# Patient Record
Sex: Male | Born: 1965 | Race: White | Hispanic: No | Marital: Single | Smoking: Current every day smoker
Health system: Southern US, Community
[De-identification: ages and names within clinical notes are randomized; demographics above are authoritative.]

## PROBLEM LIST (undated history)

## (undated) DIAGNOSIS — R569 Unspecified convulsions: Secondary | ICD-10-CM

## (undated) DIAGNOSIS — F329 Major depressive disorder, single episode, unspecified: Secondary | ICD-10-CM

## (undated) DIAGNOSIS — F32A Depression, unspecified: Secondary | ICD-10-CM

## (undated) DIAGNOSIS — E119 Type 2 diabetes mellitus without complications: Secondary | ICD-10-CM

## (undated) HISTORY — PX: LUMBAR FUSION: SHX111

## (undated) HISTORY — PX: INGUINAL HERNIA REPAIR: SHX194

---

## 2001-09-28 ENCOUNTER — Inpatient Hospital Stay (HOSPITAL_COMMUNITY): Admission: RE | Admit: 2001-09-28 | Discharge: 2001-09-29 | Payer: Self-pay | Admitting: Neurosurgery

## 2001-09-28 ENCOUNTER — Encounter: Payer: Self-pay | Admitting: Neurosurgery

## 2001-11-09 ENCOUNTER — Encounter: Admission: RE | Admit: 2001-11-09 | Discharge: 2001-11-09 | Payer: Self-pay | Admitting: Neurosurgery

## 2001-11-09 ENCOUNTER — Encounter: Payer: Self-pay | Admitting: Neurosurgery

## 2001-12-24 ENCOUNTER — Encounter: Admission: RE | Admit: 2001-12-24 | Discharge: 2001-12-24 | Payer: Self-pay | Admitting: Neurosurgery

## 2001-12-24 ENCOUNTER — Encounter: Payer: Self-pay | Admitting: Neurosurgery

## 2004-05-23 ENCOUNTER — Emergency Department: Payer: Self-pay | Admitting: Emergency Medicine

## 2004-06-09 ENCOUNTER — Emergency Department: Payer: Self-pay | Admitting: Emergency Medicine

## 2011-05-25 ENCOUNTER — Inpatient Hospital Stay: Payer: Self-pay | Admitting: Psychiatry

## 2012-02-02 LAB — COMPREHENSIVE METABOLIC PANEL
Albumin: 4.2 g/dL (ref 3.4–5.0)
Alkaline Phosphatase: 116 U/L (ref 50–136)
Anion Gap: 13 (ref 7–16)
BUN: 18 mg/dL (ref 7–18)
Bilirubin,Total: 0.9 mg/dL (ref 0.2–1.0)
Calcium, Total: 9.5 mg/dL (ref 8.5–10.1)
Chloride: 105 mmol/L (ref 98–107)
Co2: 22 mmol/L (ref 21–32)
Creatinine: 1.01 mg/dL (ref 0.60–1.30)
EGFR (African American): >60
EGFR (Non-African Amer.): >60
Glucose: 132 mg/dL — ABNORMAL HIGH (ref 65–99)
Osmolality: 283 (ref 275–301)
Potassium: 4 mmol/L (ref 3.5–5.1)
SGOT(AST): 46 U/L — ABNORMAL HIGH (ref 15–37)
SGPT (ALT): 47 U/L
Sodium: 140 mmol/L (ref 136–145)
Total Protein: 7.9 g/dL (ref 6.4–8.2)

## 2012-02-02 LAB — CBC
HCT: 50.4 % (ref 40.0–52.0)
HGB: 17 g/dL (ref 13.0–18.0)
MCH: 31.1 pg (ref 26.0–34.0)
MCHC: 33.6 g/dL (ref 32.0–36.0)
MCV: 92 fL (ref 80–100)
Platelet: 275 10*3/uL (ref 150–440)
RBC: 5.46 10*6/uL (ref 4.40–5.90)
RDW: 12.2 % (ref 11.5–14.5)
WBC: 6.9 10*3/uL (ref 3.8–10.6)

## 2012-02-02 LAB — URINALYSIS, COMPLETE
Blood: NEGATIVE
Glucose,UR: NEGATIVE mg/dL (ref 0–75)
Nitrite: NEGATIVE
Ph: 6 (ref 4.5–8.0)
RBC,UR: 120 /HPF (ref 0–5)
Specific Gravity: 1.029 (ref 1.003–1.030)
Squamous Epithelial: NONE SEEN
WBC UR: 19 /HPF (ref 0–5)

## 2012-02-02 LAB — DRUG SCREEN, URINE
Amphetamines, Ur Screen: NEGATIVE (ref ?–1000)
Cannabinoid 50 Ng, Ur ~~LOC~~: NEGATIVE (ref ?–50)
Methadone, Ur Screen: NEGATIVE (ref ?–300)
Opiate, Ur Screen: NEGATIVE (ref ?–300)

## 2012-02-02 LAB — ETHANOL
Ethanol %: <0.003 % (ref 0.000–0.080)
Ethanol: <3 mg/dL

## 2012-02-02 LAB — SALICYLATE LEVEL: Salicylates, Serum: 2.1 mg/dL

## 2012-02-02 LAB — TSH: Thyroid Stimulating Horm: 1.96 u[IU]/mL

## 2012-02-02 LAB — ACETAMINOPHEN LEVEL: Acetaminophen: <2 ug/mL

## 2012-02-03 ENCOUNTER — Inpatient Hospital Stay: Payer: Self-pay | Admitting: Psychiatry

## 2012-02-03 LAB — ACETAMINOPHEN LEVEL: Acetaminophen: <2 ug/mL

## 2012-02-03 LAB — SALICYLATE LEVEL: Salicylates, Serum: 1.7 mg/dL

## 2012-02-04 LAB — URINALYSIS, COMPLETE
Bacteria: NONE SEEN
Blood: NEGATIVE
Ph: 5 (ref 4.5–8.0)
Protein: NEGATIVE
Specific Gravity: 1.029 (ref 1.003–1.030)

## 2012-02-07 LAB — GLUCOSE, RANDOM: Glucose: 234 mg/dL — ABNORMAL HIGH (ref 65–99)

## 2012-02-09 LAB — LIPID PANEL

## 2013-06-03 ENCOUNTER — Inpatient Hospital Stay: Payer: Self-pay | Admitting: Psychiatry

## 2013-06-03 LAB — COMPREHENSIVE METABOLIC PANEL
Albumin: 4.1 g/dL (ref 3.4–5.0)
BUN: 14 mg/dL (ref 7–18)
Bilirubin,Total: 0.6 mg/dL (ref 0.2–1.0)
Calcium, Total: 9.6 mg/dL (ref 8.5–10.1)
Co2: 26 mmol/L (ref 21–32)
Creatinine: 1.1 mg/dL (ref 0.60–1.30)
EGFR (African American): >60
EGFR (Non-African Amer.): >60
Glucose: 177 mg/dL — ABNORMAL HIGH (ref 65–99)
Osmolality: 279 (ref 275–301)
Potassium: 4 mmol/L (ref 3.5–5.1)
SGPT (ALT): 76 U/L (ref 12–78)
Sodium: 137 mmol/L (ref 136–145)
Total Protein: 8 g/dL (ref 6.4–8.2)

## 2013-06-03 LAB — URINALYSIS, COMPLETE
Bilirubin,UR: NEGATIVE
Blood: NEGATIVE
Leukocyte Esterase: NEGATIVE
Ph: 7 (ref 4.5–8.0)
Protein: 30
RBC,UR: 1 /HPF (ref 0–5)
Squamous Epithelial: <1
WBC UR: 1 /HPF (ref 0–5)

## 2013-06-03 LAB — DRUG SCREEN, URINE
Amphetamines, Ur Screen: NEGATIVE (ref ?–1000)
Barbiturates, Ur Screen: NEGATIVE (ref ?–200)
Benzodiazepine, Ur Scrn: POSITIVE (ref ?–200)
MDMA (Ecstasy)Ur Screen: NEGATIVE (ref ?–500)
Methadone, Ur Screen: NEGATIVE (ref ?–300)
Opiate, Ur Screen: NEGATIVE (ref ?–300)
Phencyclidine (PCP) Ur S: NEGATIVE (ref ?–25)
Tricyclic, Ur Screen: NEGATIVE (ref ?–1000)

## 2013-06-03 LAB — CBC
HCT: 46.2 % (ref 40.0–52.0)
Platelet: 210 10*3/uL (ref 150–440)
RBC: 5.03 10*6/uL (ref 4.40–5.90)
RDW: 12.4 % (ref 11.5–14.5)
WBC: 6.2 10*3/uL (ref 3.8–10.6)

## 2013-06-03 LAB — ETHANOL
Ethanol %: <0.003 % (ref 0.000–0.080)
Ethanol: <3 mg/dL

## 2013-06-13 DIAGNOSIS — Z01810 Encounter for preprocedural cardiovascular examination: Secondary | ICD-10-CM

## 2013-06-13 LAB — CBC WITH DIFFERENTIAL/PLATELET
Eosinophil %: 2 %
HCT: 45 % (ref 40.0–52.0)
HGB: 16.1 g/dL (ref 13.0–18.0)
MCH: 32.4 pg (ref 26.0–34.0)
MCHC: 35.7 g/dL (ref 32.0–36.0)
MCV: 91 fL (ref 80–100)
Neutrophil #: 4.2 10*3/uL (ref 1.4–6.5)
WBC: 6.9 10*3/uL (ref 3.8–10.6)

## 2013-06-13 LAB — BASIC METABOLIC PANEL
Calcium, Total: 9.6 mg/dL (ref 8.5–10.1)
Creatinine: 1.21 mg/dL (ref 0.60–1.30)
EGFR (African American): >60
Glucose: 278 mg/dL — ABNORMAL HIGH (ref 65–99)
Osmolality: 280 (ref 275–301)

## 2013-06-14 LAB — URINALYSIS, COMPLETE
Leukocyte Esterase: NEGATIVE
Nitrite: NEGATIVE
Ph: 5 (ref 4.5–8.0)
Protein: 30
Squamous Epithelial: 1
WBC UR: 4 /HPF (ref 0–5)

## 2014-03-13 IMAGING — CR DG CHEST 2V
1 series · 2 of 2 positions shown · non-contrast
Comparison: none

REASON FOR EXAM: ect
COMMENTS:

PROCEDURE:     DXR - DXR CHEST PA (OR AP) AND LATERAL  - June 13, 2013  [DATE]
RESULT:     No acute cardiopulmonary disease. Heart size normal.

[Series 2: w chest pa · 0.14mm/px · 2 of 2 slices shown]
[im 1/2]
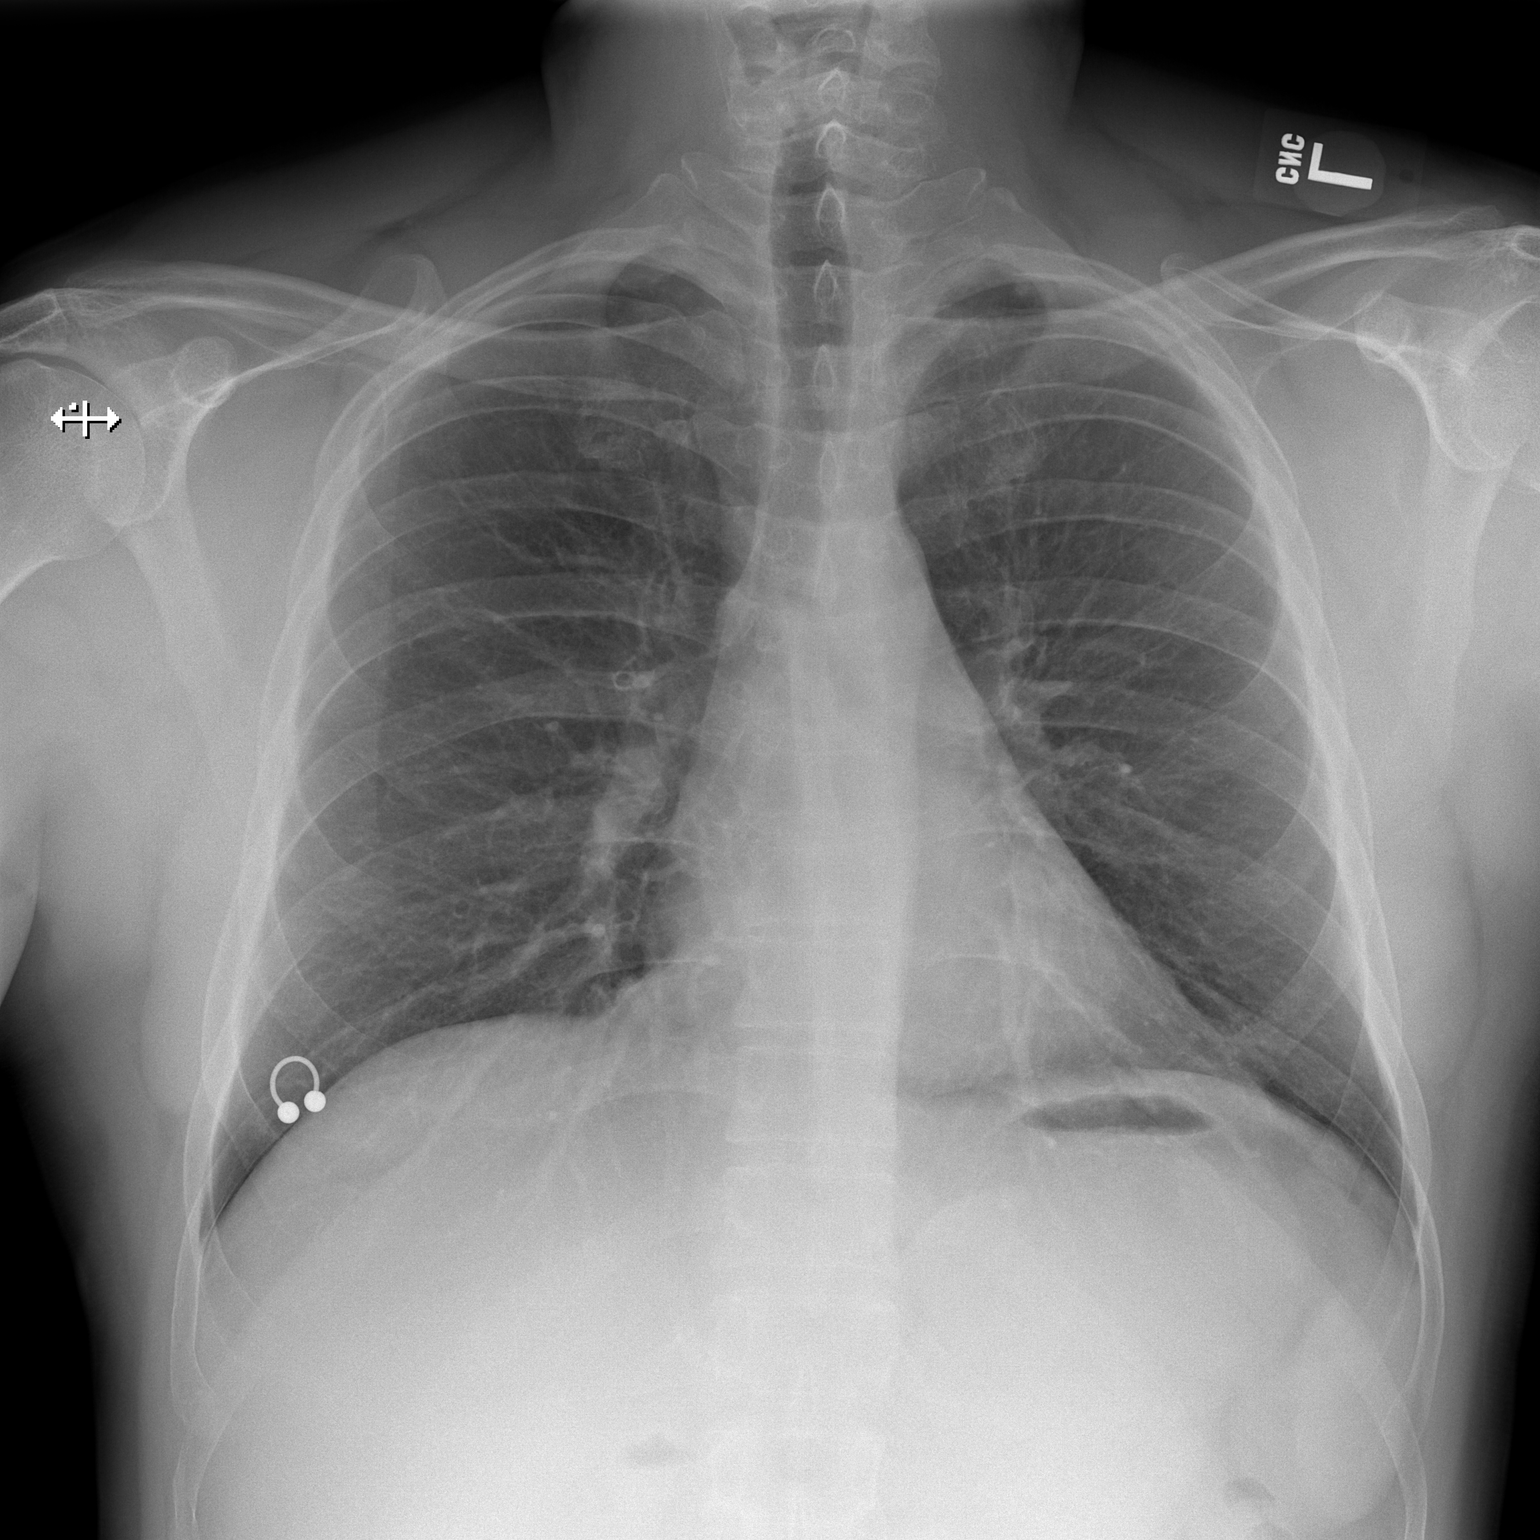
[im 2/2]
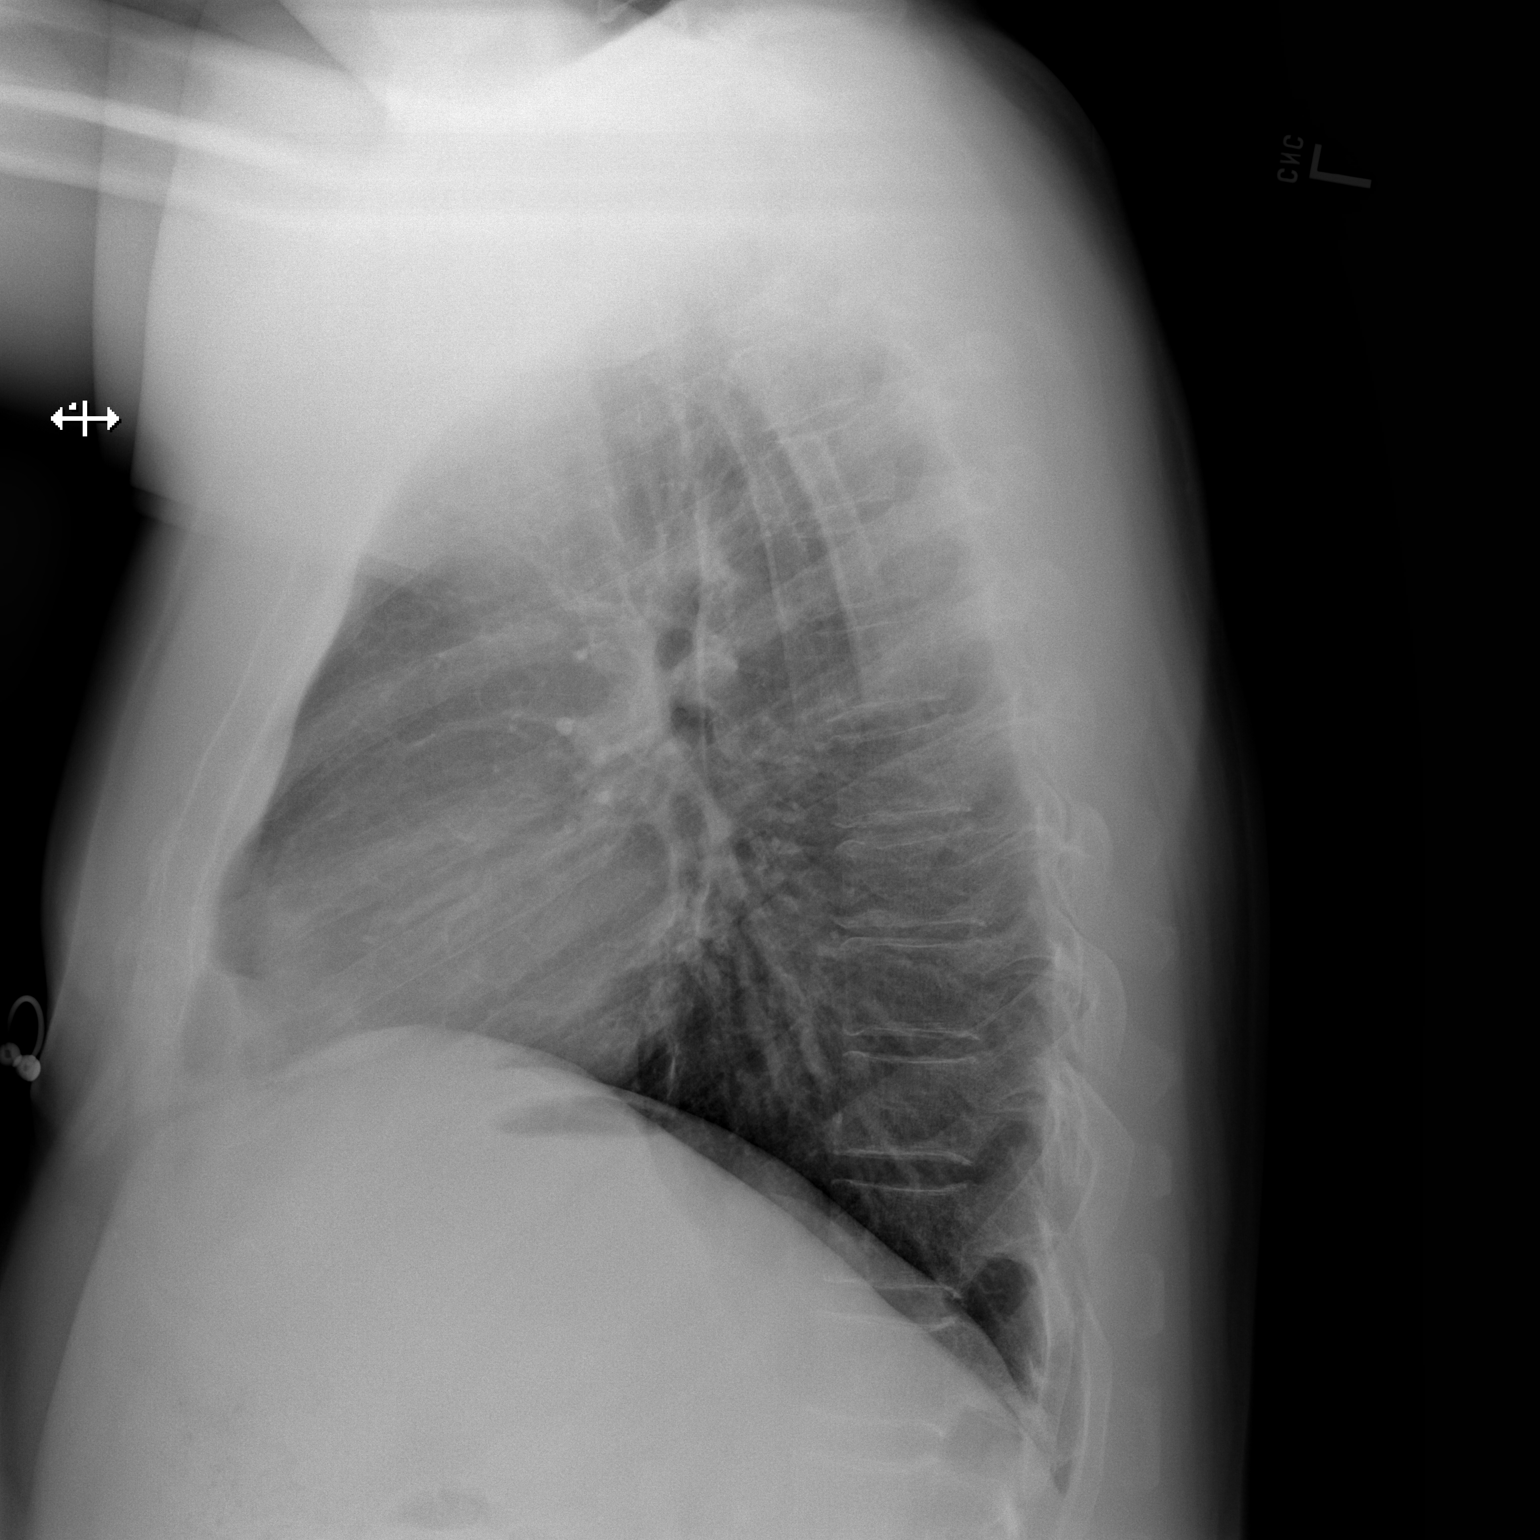

[2 of 2 positions shown; findings below may reference images not displayed]

IMPRESSION: No acute abnormality.

## 2014-12-12 NOTE — H&P (Signed)
PATIENT NAME:  Edgar White, Edgar White MR#:  [id number] DATE OF BIRTH:  03-05-1966  DATE OF ADMISSION:  06/03/2013  REFERRING PHYSICIAN: Emergency Room M.D.   ATTENDING PHYSICIAN: Orson Slick, M.D.   IDENTIFYING DATA: Edgar White is a 49 year old male with history of severe depression.   CHIEF COMPLAINT: " I just saw Edgar White."   HISTORY OF PRESENT ILLNESS: Edgar White went to see Edgar White, his regular psychiatrist and during the meeting, his disclosed to his provider that he has been increasingly depressed and anxious and has thoughts of suicide. He was sent to the hospital. The patient has a 3 year long history of depression that has been treatment resistant. The patient has not been able to function really and his elderly parents take care of him. He just learned that he 46 year old mother has ovarian cancer. She was then given 1 month to live and is getting chemotherapy now. The patient believes that she is the only friend in world as she suffers depression herself and understands him. He is overwhelmed with worry that he is going to lose her soon. He reports sleeping too much, poor appetite, anhedonia, feeling of guilt, hopelessness, worthlessness, poor energy and concentration, extreme anxiety with pacing all night long, sleeping all day long, hand wringing and intrusive thoughts of suicide. He does have a history of a suicide attempt, but denies having a plan at the moment. The patient was hospitalized at [facility] in the past. At that time, ECT treatment was reportedly recommended. The patient did not get treatment then. He came to the hospital encouraged by Edgar White that maybe this time around, ECT treatment is possible. The patient has no health insurance. He denies psychotic symptoms. He denies symptoms suggestive of bipolar mania. He denies alcohol or substance use.   PAST PSYCHIATRIC HISTORY: He has been hospitalized twice at [facility] in 2012  and 2013. He has been tried on multiple medications. He tried Effexor that at some point stopped working, Abilify and Celexa. He now has to take only cheap medications as he was refused ALAMAP assistance due to his parents income and assets. There is a history of prescription pill abuse including narcotics and benzodiazepines, but no history of alcohol or illicit substance use. There was 1 suicide attempt by overdose that he admits to.   FAMILY PSYCHIATRIC HISTORY: Mother with depression.   PAST MEDICAL HISTORY: Diabetes and GERD.   ALLERGIES: No known drug allergies.   MEDICATIONS ON ADMISSION: Zantac 150 mg twice daily, metformin 500 mg twice daily, Celexa 40 mg daily, Xanax 1 mg 3 times daily and Abilify 10 mg at bedtime.   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 He lives with his parents. He has never been married and has no children. He is a homosexual, but not in a relationship at the moment. He spends time at home except for visits to the gym with his brother 4 times a week. He complains that he does not have the energy to exercise.   REVIEW OF SYSTEMS: CONSTITUTIONAL: No fevers or chills. Positive for weight gain.  EYES: No double or blurred vision. ENT: No hearing loss. RESPIRATORY: No shortness of breath or cough. CARDIOVASCULAR: No chest pain or orthopnea. GASTROINTESTINAL: No abdominal pain, nausea, vomiting, or diarrhea. GENITOURINARY: No incontinence or frequency. ENDOCRINE: No heat or cold intolerance.  LYMPHATIC: No anemia or easy bruising. INTEGUMENTARY: No acne or rash. MUSCULOSKELETAL: No muscle or joint pain. NEUROLOGIC: No tingling or weakness. PSYCHIATRIC: See history of present illness for details.   PHYSICAL EXAMINATION:  VITAL SIGNS: Blood pressure 149/81, pulse 77, respirations 20, temperature 97.6.  GENERAL: This is an obese male in no acute distress.  HEENT: The pupils are  equal, round, and reactive to light. Sclerae are anicteric.  NECK: Supple. No thyromegaly.  LUNGS: Clear to auscultation. No dullness to percussion.  HEART: Regular rhythm and rate. No murmurs, rubs, or gallops.  ABDOMEN: Soft, nontender, nondistended. Positive bowel sounds.  MUSCULOSKELETAL: Normal muscle strength in all extremities.  SKIN: No rashes or bruises.  LYMPHATIC: No cervical adenopathy.  NEUROLOGIC: Cranial nerves II through XII are intact.   LABORATORY DATA: Chemistries are within normal limits. Blood sugar 177. Blood alcohol level is 0. LFTs within normal limits except for AST of 51. TSH 1.08. Urine tox screen is positive for benzodiazepines. CBC within normal limits used. Urinalysis is not suggestive of urinary tract infection.   MENTAL STATUS EXAMINATION ON ADMISSION: The patient is alert and oriented to person, place, time and situation. He is pleasant, polite and cooperative. He is in bed. There is severe psychomotor retardation. He cries throughout the interview. His eye contact is poor. His grooming is adequate. His speech is slow and soft. Mood is depressed with anxious affect. Even though there is severe psychomotor retardation he, at times, wrings his hands and moves his legs. I am wondering if he is withdrawing from benzodiazepines. His mood is depressed. Affect tearful. Thought process is logical. Thought content: He admits to passing suicidal ideations that are intrusive in the past couple of weeks. There are no delusions or paranoia. There are no auditory or visual hallucinations. His cognition is grossly intact. He registers 3 out of 3 and recalls 3 out of 3 objects after 5 minutes. He can spell "world" forward and backwards. He knows the current Software engineer. His insight and judgment are limited.  SUICIDE RISK ASSESSMENT ON ADMISSION: This is a patient with history of treatment resistant depression and suicide attempt, who is now under considerable stress due to illness of his  primary support person. He is at increased risk of suicide.   DIAGNOSES:  AXIS I: Major depressive disorder, recurrent, severe. Benzodiazepine dependence.  AXIS II: Personality disorder, not otherwise specified.  AXIS III: Diabetes and gastroesophageal reflux disease.  AXIS IV: Mental illness, major loss.  AXIS V: Global assessment of functioning on admission 25.   PLAN: The patient was admitted to [facility] behavioral medicine unit for safety, stabilization and medication management. He was initially placed on suicide precautions and was closely monitored for any unsafe behaviors. He underwent full psychiatric and risk assessment. He received pharmacotherapy, individual and group psychotherapy, substance abuse counseling, and support from therapeutic milieu.  1.  Suicidal ideation. The patient is able to contract for safety.  2.  Mood. We will continue his current regimen of Celexa. The patient has no resources and wants to stick with a cheap medication, however. We will ask Dr. Weber Cooks for a consultation regarding the possibility of ECT treatment.  3.  Anxiety: The patient appears quite anxious. I wonder if he is using his Xanax prescribed by Edgar White appropriately. He is taking 3 pills a day. He claims that he did not get any this morning while in the hospital. We will restart that and give it at 8:00, noon and 5:00 in  the afternoon and use a sleeping aid if necessary to induce sleep.   4.  Medical. We will continue his medications for GERD and diabetes with blood glucose monitoring.   DISPOSITION: He will return home with his parents.   ____________________________ Wardell Honour. Bary Leriche, MD jbp:aw D: 06/04/2013 11:35:22 ET T: 06/04/2013 12:04:13 ET JOB#: [id number]  cc: Emad Brechtel B. Bary Leriche, MD, <Dictator> Clovis Fredrickson MD ELECTRONICALLY SIGNED 06/04/2013 21:02

## 2014-12-12 NOTE — Consult Note (Signed)
Brief Consult Note: Diagnosis: Major depressive disorder.   Patient was seen by consultant.   Consult note dictated.   Recommend further assessment or treatment.   Orders entered.   Comments: Will admit to psychiatry.  Electronic Signatures: Orson Slick (MD)  (Signed 13-Oct-14 14:51)  Authored: Brief Consult Note   Last Updated: 13-Oct-14 14:51 by Orson Slick (MD)

## 2014-12-12 NOTE — Discharge Summary (Signed)
PATIENT NAME:  Edgar White, Edgar White MR#:  [id number] DATE OF BIRTH:  November 18, 1965  DATE OF ADMISSION:  06/03/2013  DATE OF DISCHARGE:  07/02/2013  HOSPITAL COURSE: See dictated history and physical for details of admission. A 48 year old man with a history of recurrent major depression, was admitted to the hospital once again depressed, voicing suicidal ideation. In the hospital, he continued to have symptoms of severe depression, low mood, negative thinking, suicidal ideation. Medication did not seem to be producing a lasting effect. The patient was requesting ECT. He was ambivalent at first because his lack of insurance means he would not be able to continue to get ECT for maintenance, but eventually we decided to give it a try to see if we could get him better in the hospital. He was transferred to my service and started ECT on October 24. At the time of discharge, he has had 8  right unilateral ECT treatments, which he has tolerated well, without any major difficulty. He had some short-term memory loss, as would be expected, and he also has complained of a feeling like he has a band around his head, although it is not painful. For the first half of his treatment course, he did not show significant improvement, but over the last several days he had gotten much better. His mood is now stated as being good. His affect is brighter. He denies suicidal ideation. He is less anxious, and he is much more calm. He remains lucid. He is optimistic about the future. He understands that he will not be able to get maintenance ECT treatment unless he gets insurance, but will follow up with Dr. Randel Books outside the hospital. We did make some medication adjustments, having discontinued his antipsychotics, and also cut back a little bit on some of his antidepressants in an attempt to resolve some side effects of agitation he was having. I have educated the patient that the antidepressant doses are certainly not set in stone, and that he  can follow up with his outpatient psychiatrist about appropriate followup treatment. His blood sugars have been under reasonably good control in the hospital. Otherwise, he appears to be medically stable. He will be discharged today. Dangerousness appears to be resolved for the time being.   LABORATORY RESULTS: Admission labs included a urinalysis positive for 50 glucose, which is modest. Drug screen was positive for benzodiazepines. CBC normal. Chemistry panel showed a glucose elevated at 177. AST elevated at 51. Alcohol undetected. TSH normal. Ventricular rate was 88, normal EKG. Blood sugars have been in the mostly mid 100 to low 200 range.   DISCHARGE MEDICATIONS: Bupropion extended-release 300 mg once a day, citalopram 20 mg once a day, metformin 500 mg twice a day, hydroxyzine 50 mg every 6 hours as needed for anxiety, ranitidine 150 mg twice a day.   MENTAL STATUS EXAMINATION AT DISCHARGE: A neatly groomed, casually dressed gentleman who looks his stated age, appropriate in the interview. Good eye contact, normal psychomotor activity. Speech normal rate, tone and volume. Affect euthymic, smiling, reactive, appropriate. Mood stated as being okay. Thoughts are generally lucid. No sign of loosening of associations or delusions. Denies auditory or visual hallucinations. Reports suicidal ideation is gone. Good judgment and insight.   DISPOSITION: Discharge home with his family. Follow up with [facility].   DIAGNOSIS, PRINCIPAL AND PRIMARY:  AXIS I: Major depression, severe, recurrent.   SECONDARY DIAGNOSES: AXIS I:  No further.  AXIS II:  Deferred.  AXIS III:  Diabetes, gastric reflux symptoms.  AXIS IV:  Severe, from joblessness, depression, no money, limited resources.  AXIS V:  Functioning at time of discharge 7.     ____________________________ Gonzella Lex, MD jtc:mr D: 07/02/2013 16:06:00 ET T: 07/02/2013 19:43:00 ET JOB#: [id number]  cc: Gonzella Lex, MD, <Dictator> Gonzella Lex  MD ELECTRONICALLY SIGNED 07/03/2013 17:47

## 2014-12-14 NOTE — Discharge Summary (Signed)
PATIENT NAME:  Edgar White, Edgar White MR#:  [id number] DATE OF BIRTH:  Apr 26, 1966  DATE OF ADMISSION:  02/03/2012 DATE OF DISCHARGE:  02/11/2012  HOSPITAL COURSE: See dictated history and physical for details of admission. This 49 year old man came to the Emergency Room after taking an intentional overdose of Effexor with suicidal intent. He reported a long history of major depression with recent exacerbation. Previously it had responded well to Effexor but recently he did not seem to be getting any improvement despite compliance with his medicine. Patient stated that he was hopeless, felt out of control. He admitted that he had taken some benzodiazepines from his mother but denied that he was abusing other drugs. In the hospital we discussed treatment options. The idea of electroconvulsive therapy was discussed. It was one option but because he is a crisis patient he would have still been billed for the anesthesia and would have incurred a significant expense for it. We discussed other options and I started him on a tricyclic antidepressant. He had never been on one before. He has been taking nortriptyline 50 mg at night and has been tolerating it well. He reports mild dry mouth but no other significant side effects. He has shown significant improvement in his mood. For several days now he has said that his mood feels calmer and that his thoughts feel clearer. He does not feel depressed. Feels more upbeat and hopeful. Denies any suicidal ideation. He has interacted well with other patient's and gone to groups appropriately. He is taking care of himself and shows improved insight. As far as his mental status goes he has shown significant improvement in depression. He has been given individual and group supportive and educational counseling. At this time he feels stable and completely denied suicidal ideation. He has been educated about the risks of his medication including the potential fatality of an overdose of  nortriptyline and warned not to over dose on it. He has been agreeable to this plan. At this time he will be discharged and will follow up with Dr. Jacqualine Code in the community. Medically he had some changes in the hospital. It was discovered that his blood sugar was running high. He had not previously been diagnosed with diabetes but an endocrine consult was done and it turns out that he does have diabetes. He has been started on metformin. He is educated about this and needs to follow up with an outpatient primary care doctor for treatment of his diabetes. We also checked his lipid panel and he has elevated cholesterol and low HDL. The triglycerides are probably not meaningful given that it was not a fasting blood draw. He needs to have all of that followed up and checked in the future as well and has been educated about that.   MENTAL STATUS EXAM AT DISCHARGE: Neatly dressed and groomed man. Good eye contact. Normal psychomotor activity. Speech normal rate, tone, and volume. Alert and oriented x4. Affect reactive and euthymic. Mood stated as being good. Thoughts are lucid with no evidence of loosening of associations or delusions. Denies auditory or visual hallucinations. Denies suicidal or homicidal ideation. Shows improved judgment and insight.   DISCHARGE MEDICATIONS:  1. TriCor 48 mg per day.  2. Nortriptyline 50 mg at night. 3. Zantac 150 mg twice a day. 4. Trazodone 50 mg at night. 5. Metformin 500 mg twice a day.   LABORATORY, DIAGNOSTIC AND RADIOLOGICAL DATA: Admission labs show benzodiazepines positive in the drug screen. TSH normal. Alcohol undetectable. Chemistry showed slightly  elevated AST at 46, glucose 132 on admission. CBC was normal. Urinalysis showed positive protein and glucose as well as trace leukocyte esterase, positive red blood cells and white blood cells. Acetaminophen and salicylates normal. Follow up urinalysis showed no red blood cells, still with significant glucose. The  hemoglobin A1c was 6.4, which is slightly elevated. The lipid panel showed a total cholesterol of 242, triglycerides 793, HDL 18.   DISPOSITION: He is discharged back home with his mother and will follow up with Dr. Jacqualine Code, appointment is made. He is encouraged to get a primary care doctor and follow up for his diabetes and elevated lipids.   DIAGNOSIS PRINCIPLE AND PRIMARY:  AXIS I: Major depressive episode, severe, recurrent.   SECONDARY DIAGNOSES:  AXIS I: Benzodiazepine abuse.   AXIS II: Deferred.   AXIS III:  1. Diabetes. 2. Hypercholesterolemia. 3. Status post overdose of Effexor with no side effects.   AXIS IV: Moderate to severe from lack of resources, lack of job, minimal social support.    AXIS V: Functioning at time of discharge 60.   ____________________________ Gonzella Lex, MD jtc:cms D: 02/11/2012 12:17:28 ET T: 02/11/2012 13:14:20 ET JOB#: [id number]  cc: Gonzella Lex, MD, <Dictator> Gonzella Lex MD ELECTRONICALLY SIGNED 02/11/2012 23:09

## 2014-12-14 NOTE — Consult Note (Signed)
PATIENT NAME:  Edgar White, Edgar White MR#:  [id number] DATE OF BIRTH:  September 15, 1965  DATE OF CONSULTATION:  02/07/2012  REFERRING PHYSICIAN:  Alethia Berthold, MD CONSULTING PHYSICIAN:  A. Lavone Orn, MD  CHIEF COMPLAINT: Hyperglycemia.   HISTORY OF PRESENT ILLNESS: This is a 49 year old male seen in consultation for hyperglycemia. He is admitted to the Behavior Medicine Unit for depression. He was admitted on 02/02/2012. He has no known history of diabetes. On 02/04/2012 he had an elevated glucose level in the urine of 150 mg/dL. Since that time he has had several fingerstick blood sugars which have been consistently elevated. His fasting fingerstick today was 223 and fasting venous blood sugar was 234. Patient confirms it was fasting, states he had not had anything to eat since the night prior. He has not had any recent use of glucocorticoids. He denies any recent illness. A hemoglobin A1c was found to be 6.4% today. He denies any polyuria or polydipsia. He denies any blurred vision. He denies any numbness, tingling or pain in the feet. He has had about an 8 pound weight loss in the last two months which he attributes to decreased oral intake due to poor appetite from depression. He does complain of long-standing fatigue. He denies any recent nausea, vomiting, or abdominal pain. No known history of pancreatitis.   PAST MEDICAL HISTORY: Depression.   SOCIAL HISTORY: Single. Not employed. Smokes tobacco.   FAMILY HISTORY: His mother, father and brother all have diabetes.   ALLERGIES: No known drug allergies.   INPATIENT MEDICATIONS:  1. Ativan 1 mg each morning as needed.  2. Nortriptyline 50 mg at bedtime.  3. Trazodone 50 mg at bedtime.  4. Effexor-XR 75 mg daily.   REVIEW OF SYSTEMS: As per history of present illness. In addition: HEENT: No headache. No sore throat. NECK: No neck pain. No dysphasia. CARDIAC: No chest pain. No palpitations. PULMONARY: No cough, no shortness of breath. ABDOMEN: No  abdominal pain. No recent change in bowel habits. SKIN: No recent skin changes or pruritus. ENDO: No heat or cold intolerance. LYMPH: No easy bruisability or recent bleeding.   PHYSICAL EXAMINATION:  VITAL SIGNS: Temperature 98.8, pulse 112, respirations 20, blood pressure 137/85.   GENERAL: Well-developed, well-nourished white male in no acute distress.   HEENT: Extraocular movements are intact. No proptosis, lid lag or stare. Oropharynx is clear. Mucous membranes moist.   NECK: Supple. No thyromegaly. No carotid bruit.   CARDIAC: Regular rate and rhythm. No murmur.   PULMONARY: Clear to auscultation bilaterally. No wheeze or rhonchi. Good inspiratory effort.   ABDOMEN: Diffusely soft, nontender, nondistended. No hepatosplenomegaly.   SKIN: No rash or dermatopathy.   MUSCULOSKELETAL: No leg edema. Good motor tone throughout. Good strength.   LYMPH: No submandibular or anterior cervical lymphadenopathy.   PSYCHIATRIC: Alert and oriented x3, calm and cooperative.   LABORATORY, DIAGNOSTIC AND RADIOLOGICAL DATA: 02/02/2012 glucose 132, BUN 18, creatinine 1.01, sodium 140, potassium 4.0, chloride 105, eGFR greater than 60, calcium 9.5, AST 46, ALT 47. TSH 1.96. 02/07/2012 hemoglobin A1c 6.4%.   ASSESSMENT: This is a 49 year old male with high fasting blood sugars concerning for diabetes and A1c in the prediabetes range, he probably does have early onset type 2 diabetes.   RECOMMENDATIONS:  1. Discussed with patient this diagnosis. He is familiar with diabetes. As an outpatient, he should be set up for diabetes education. I do not recommend inpatient diabetes teaching at this time as I suspect it would not be too useful in  the setting of his severe depression.  2. Recommend initiation of metformin 500 mg daily. This is to be given at meals. Counseled patient about potential GI side effects.  3. Counseled patient that he would benefit from weight loss and regular exercise.  4. I will  follow along with you and increase his metformin, as tolerated and as needed.   Thank you for the kind request for consultation.   ____________________________ A. Lavone Orn, MD ams:cms D: 02/07/2012 16:37:56 ET T: 02/07/2012 17:14:23 ET JOB#: [id number]  cc: A. Lavone Orn, MD, <Dictator>  Sherlon Handing MD ELECTRONICALLY SIGNED 02/21/2012 8:40

## 2014-12-14 NOTE — Consult Note (Signed)
Chief Complaint and History:   Referring Physician Dr. Weber Cooks    Chief Complaint hyperglycemia   Allergies:  No Known Allergies:   Assessment/Plan:   Assessment/Plan Patient was seen, examined, and chart was reviewed. He is a 49 yo M admitted with depression. He has no known history of diabetes. He has a strong family history of diabetes. He had glucosuria and several high sugars on fingerstick testing. His Hgb A1c is 6.4%.  A/ Probable diabetes  P/  1. Educated patient about diagnosis. In future, it would be helpful to arrange out-patient diabetes education. 2. Start metformin ER 500 mg at supper. 3. Continue monitoring of FSBS qAM fasting.  I will follow with you and consider titrating up metformin, as needed.   Electronic Signatures: Judi Cong (MD)  (Signed 18-Jun-13 16:30)  Authored: Chief Complaint and History, ALLERGIES, Assessment/Plan   Last Updated: 18-Jun-13 16:30 by Judi Cong (MD)

## 2014-12-14 NOTE — H&P (Signed)
PATIENT NAME:  Edgar White, Edgar White MR#:  [id number] DATE OF BIRTH:  05-18-1966  DATE OF ADMISSION:  02/02/2012  IDENTIFYING INFORMATION AND CHIEF COMPLAINT: This is a 49 year old man with a history of major depression who comes into the Emergency Room after taking an overdose of prescription Effexor.   CHIEF COMPLAINT: "I just can't take it anymore."   HISTORY OF PRESENT ILLNESS: The patient states that his depression is severe. He feels sad and depressed all the time. His energy is very poor. He stays in bed for up to 20 hours a day. He has almost no social interaction. His appetite has been poor. He has almost constant ruminative thoughts about suicide and death. He says that recently he has been having experiences that he describes as feeling like he is "out of his body" or "not there". When he describes it to me it sounds like probably dissociative symptoms associated with cognitive slowing of depression, but not actually psychotic symptoms. He has been taking Effexor 300 mg a day from Dr. Jacqualine Code. He says that in the last six months he has used cocaine twice and has taken the occasional Percocet but is not using substances on a daily basis and is not drinking. He has been feeling increasingly hopeless, like there is nothing he can do to get better. Effexor used to be helpful for him, but in the last couple of years it is no longer treating his depression. He has been on multiple other medications without benefit.   PAST PSYCHIATRIC HISTORY: The patient says that he has never actually taken an overdose or tried to kill himself in the past before, although he has had chronic suicidal ideation. He has had previous hospitalizations, most recently here in October 2012. At that time he was diagnosed with major depression with psychotic features and was treated with Abilify and Effexor. He says he was never all that impressed that the Abilify made a big difference, but that he could not afford it anyway because he  was denied. [employer]  funds.   SUBSTANCE ABUSE HISTORY: He has a history of abusing substances, especially prescription narcotics and benzodiazepines. Currently he does not seem to be actively having dependence behavior.   SOCIAL HISTORY: The patient is unemployed. He has been out of work since 2008. He is a Therapist, nutritional by training. He lives with his parents. The patient is gay but is currently not in any kind of relationship. He seems to feel quite isolated. He does say that his brother makes him get up and go to the gym 3 times a week and that he does that, but that it has not been making him feel any better.   PAST MEDICAL HISTORY: Does not have any significant ongoing medical problems.   CURRENT MEDICATIONS:  1. Effexor 300 mg per day.  2. Ativan 1 mg per a.m.   ALLERGIES: No known drug allergies.   REVIEW OF SYSTEMS: The patient complains of depressed mood, constant sadness, fatigue, excessive sleepiness, decreased appetite, ruminative suicidal thoughts, feeling cognitively slowed. Does not have any specific physical complaints otherwise. Denies pain. Denies nausea or vomiting or shortness of breath.   MENTAL STATUS EXAM: Neatly groomed man in hospital garb interviewed in the Emergency Room. He was alert, awake, and completely cooperative. Made good eye contact throughout the interview. Affect was constricted. Mood stated as depressed. Speech was normal in rate and tone. Psychomotor activity normal. Thoughts appeared lucid and directed with no evidence of loosening of  associations. Denied auditory or visual hallucinations. Endorses suicidal ideation and hopelessness. Denies homicidal ideation. Cognitively intact. Short-term and longer-term memory grossly intact. Appears to be of above average intelligence. Judgment recently impaired with suicide attempt. Insight adequate.   PHYSICAL EXAMINATION:  GENERAL: The patient is a healthy-appearing man who looks his stated  age.   SKIN: No skin lesions identified.   VITAL SIGNS: His most recent vital signs show a temperature of 97.7, pulse 93, respirations 20, blood pressure 112/70.   HEENT: Pupils are equal and reactive. Face is symmetric.   NECK: Nontender.   BACK: Nontender.   MUSCULOSKELETAL: Full range of motion at all extremities. Normal gait.   NEURO: Cranial nerves all intact and symmetric. Strength is symmetric as well as reflexes upper and lower bilaterally.   LUNGS: Clear to auscultation with no wheezes.   HEART: Regular rate and rhythm, normal sounds.   ABDOMEN: Soft, nontender, normal bowel sounds.   ASSESSMENT: This is a 49 year old man with severe, recurrent major depression which is currently resistant to treatment with medication. In the past Effexor was the only thing that was consistently helpful, but he has currently been taking 300 mg a day for several months with no benefit. He has a past history of failure to respond to multiple other antidepressants as well as lithium. Currently made a suicide attempt with serious intent and has ongoing suicidal ideation requiring hospitalization.   TREATMENT PLAN: Admit to the hospital. Review labs. Check vital signs regularly. Engage him in groups and activities and therapy on the unit. I have discussed appropriate treatment with him. I have raised the possibility of electroconvulsive therapy next week and he is willing to consider it. I have also suggested to him that given his history he has not been on a tricyclic antidepressant and that it would make sense to try a tricyclic antidepressant. I warned him about the potential risk of overdose including fatality with it. He was agreeable to that, particularly when I told him that it might help to improve his appetite. I will try weaning him gradually on the Effexor while starting nortriptyline 50 mg at night. Ativan p.r.n. and in the morning.   DIAGNOSIS PRINCIPLE AND PRIMARY:  AXIS I: Major  depressive episode, severe, recurrent.   SECONDARY DIAGNOSES:  AXIS I: No further.   AXIS II: Deferred.   AXIS III: No diagnosis.   AXIS IV: Severe from being out of work, very limited access to treatment. No money.   AXIS V: Functioning at time of evaluation: 32.    ____________________________ Gonzella Lex, MD [id number] D: 02/03/2012 14:28:00 ET T: 02/03/2012 14:46:07 ET JOB#: [id number]  cc: Gonzella Lex, MD, <Dictator> Gonzella Lex MD ELECTRONICALLY SIGNED 02/03/2012 16:06

## 2014-12-14 NOTE — Consult Note (Signed)
Brief Consult Note: Diagnosis: mde.   Patient was seen by consultant.   Recommend further assessment or treatment.   Orders entered.   Comments: Psychiatry: Patient seen. Severe recurrent major depression with overdose. Non-dangerous od but done with lethal intent. Admit to [facility]. See full H&P.  Electronic Signatures: Gonzella Lex (MD)  (Signed 14-Jun-13 14:34)  Authored: Brief Consult Note   Last Updated: 14-Jun-13 14:34 by Gonzella Lex (MD)

## 2015-07-12 ENCOUNTER — Emergency Department (HOSPITAL_COMMUNITY)
Admission: EM | Admit: 2015-07-12 | Discharge: 2015-07-12 | Disposition: A | Discharge status: Home / Self Care | Payer: 59 | Attending: Emergency Medicine | Admitting: Emergency Medicine

## 2015-07-12 ENCOUNTER — Encounter (HOSPITAL_COMMUNITY): Payer: Self-pay | Admitting: *Deleted

## 2015-07-12 DIAGNOSIS — Y9289 Other specified places as the place of occurrence of the external cause: Secondary | ICD-10-CM | POA: Diagnosis not present

## 2015-07-12 DIAGNOSIS — E119 Type 2 diabetes mellitus without complications: Secondary | ICD-10-CM | POA: Insufficient documentation

## 2015-07-12 DIAGNOSIS — Y998 Other external cause status: Secondary | ICD-10-CM | POA: Diagnosis not present

## 2015-07-12 DIAGNOSIS — F329 Major depressive disorder, single episode, unspecified: Secondary | ICD-10-CM | POA: Diagnosis not present

## 2015-07-12 DIAGNOSIS — S4991XA Unspecified injury of right shoulder and upper arm, initial encounter: Secondary | ICD-10-CM | POA: Diagnosis present

## 2015-07-12 DIAGNOSIS — F17210 Nicotine dependence, cigarettes, uncomplicated: Secondary | ICD-10-CM | POA: Insufficient documentation

## 2015-07-12 DIAGNOSIS — S46811A Strain of other muscles, fascia and tendons at shoulder and upper arm level, right arm, initial encounter: Secondary | ICD-10-CM | POA: Insufficient documentation

## 2015-07-12 DIAGNOSIS — Y9389 Activity, other specified: Secondary | ICD-10-CM | POA: Diagnosis not present

## 2015-07-12 DIAGNOSIS — Z79899 Other long term (current) drug therapy: Secondary | ICD-10-CM | POA: Diagnosis not present

## 2015-07-12 DIAGNOSIS — X58XXXA Exposure to other specified factors, initial encounter: Secondary | ICD-10-CM | POA: Diagnosis not present

## 2015-07-12 HISTORY — DX: Major depressive disorder, single episode, unspecified: F32.9

## 2015-07-12 HISTORY — DX: Type 2 diabetes mellitus without complications: E11.9

## 2015-07-12 HISTORY — DX: Depression, unspecified: F32.A

## 2015-07-12 MED ORDER — HYDROCODONE-ACETAMINOPHEN 5-325 MG PO TABS: 1.0000 | ORAL_TABLET | ORAL | Status: DC | PRN

## 2015-07-12 MED ORDER — PREDNISONE 50 MG PO TABS: ORAL_TABLET | ORAL | Status: DC

## 2015-07-12 NOTE — ED Provider Notes (Signed)
CSN: [id number]     Arrival date & time 07/12/15  0807 History   First MD Initiated Contact with Patient 07/12/15 0815     Chief Complaint  Patient presents with  . Shoulder Pain     (Consider location/radiation/quality/duration/timing/severity/associated sxs/prior Treatment) HPI..... Status post pain in right upper back area with radiation to shoulder and hand for approximately one week after quickly rolling over in bed and turning his head to the right side. Right hand feels numb and tingly. His has been to his primary care doctor earlier in the week and given muscle relaxers and anti-inflammatory medicine. Severity of pain is moderate.  Past Medical History  Diagnosis Date  . Diabetes mellitus without complication ([place])   . Depression    Past Surgical History  Procedure Laterality Date  . Lumbar fusion    . Inguinal hernia repair     No family history on file. Social History  Substance Use Topics  . Smoking status: Current Every Day Smoker    Types: Cigarettes  . Smokeless tobacco: Never Used  . Alcohol Use: Yes     Comment: once a week    Review of Systems  All other systems reviewed and are negative.     Allergies  Review of patient's allergies indicates no known allergies.  Home Medications   Prior to Admission medications   Medication Sig Start Date End Date Taking? Authorizing Provider  citalopram (CELEXA) 40 MG tablet Take 40 mg by mouth daily.   Yes Historical Provider, MD  desvenlafaxine (PRISTIQ) 50 MG 24 hr tablet Take 50 mg by mouth daily.   Yes Historical Provider, MD  HYDROcodone-acetaminophen (NORCO/VICODIN) 5-325 MG tablet Take 1-2 tablets by mouth every 4 (four) hours as needed. 07/12/15   Nat Christen, MD  predniSONE (DELTASONE) 50 MG tablet One tab daily for 7 days 07/12/15   Nat Christen, MD   BP 125/88 mmHg  Pulse 75  Temp(Src) 97.5 F (36.4 C) (Oral)  Resp 16  SpO2 100% Physical Exam  Constitutional: He is oriented to person, place, and  time. He appears well-developed and well-nourished.  HENT:  Head: Normocephalic and atraumatic.  Eyes: Conjunctivae and EOM are normal. Pupils are equal, round, and reactive to light.  Neck: Normal range of motion. Neck supple.  Cardiovascular: Normal rate and regular rhythm.   Pulmonary/Chest: Effort normal and breath sounds normal.  Abdominal: Soft. Bowel sounds are normal.  Musculoskeletal:  Tender in right mid trapezius muscle  Neurological: He is alert and oriented to person, place, and time.  Full range of motion of right upper extremity with discomfort. Good grip in right hand. Sensation intact  Skin: Skin is warm and dry.  Psychiatric: He has a normal mood and affect. His behavior is normal.  Nursing note and vitals reviewed.   ED Course  Procedures (including critical care time) Labs Review Labs Reviewed - No data to display  Imaging Review No results found. I have personally reviewed and evaluated these images and lab results as part of my medical decision-making.   EKG Interpretation None      MDM   Final diagnoses:  Trapezius muscle strain, right, initial encounter    Radicular component to right trapezius pain with numbness and tingling in the right hand. Discharge medications prednisone and Vicodin. He will follow-up with his primary care physician    Nat Christen, MD 07/12/15 [phone]

## 2015-07-12 NOTE — ED Notes (Signed)
MD at bedside. 

## 2015-07-12 NOTE — Discharge Instructions (Signed)
Medication for pain and prednisone. Heating pad. Follow-up your primary care doctor.

## 2015-07-12 NOTE — ED Notes (Signed)
Patient states one week ago he turned his head suddenly while sleeping and "felt a pop," in right posterior shoulder.  Patient states he now has right shoulder and neck pain - worse when he puts his chin to chest.  Patient can move head side to side, but not without pain.  Patient c/o numbness/tingling in right shoulder/arm/hand.  Patient was seen at [facility] this past Friday and they prescribed Robaxin and Naproxen.  He states he has gained no relief from these medications.  Patient denies dizziness and N/V.

## 2016-02-03 ENCOUNTER — Emergency Department (HOSPITAL_COMMUNITY)
Admission: EM | Admit: 2016-02-03 | Discharge: 2016-02-04 | Disposition: A | Discharge status: Home / Self Care | Payer: BLUE CROSS/BLUE SHIELD | Attending: Emergency Medicine | Admitting: Emergency Medicine

## 2016-02-03 ENCOUNTER — Encounter (HOSPITAL_COMMUNITY): Payer: Self-pay | Admitting: *Deleted

## 2016-02-03 DIAGNOSIS — F329 Major depressive disorder, single episode, unspecified: Secondary | ICD-10-CM

## 2016-02-03 DIAGNOSIS — Z760 Encounter for issue of repeat prescription: Secondary | ICD-10-CM

## 2016-02-03 DIAGNOSIS — F17210 Nicotine dependence, cigarettes, uncomplicated: Secondary | ICD-10-CM | POA: Diagnosis not present

## 2016-02-03 DIAGNOSIS — E119 Type 2 diabetes mellitus without complications: Secondary | ICD-10-CM | POA: Diagnosis not present

## 2016-02-03 DIAGNOSIS — Z79899 Other long term (current) drug therapy: Secondary | ICD-10-CM | POA: Diagnosis not present

## 2016-02-03 DIAGNOSIS — F32A Depression, unspecified: Secondary | ICD-10-CM

## 2016-02-03 DIAGNOSIS — Z7984 Long term (current) use of oral hypoglycemic drugs: Secondary | ICD-10-CM | POA: Insufficient documentation

## 2016-02-03 NOTE — Progress Notes (Signed)
Per chart review, patient needing assistance with cost of his medication.  Patient is on Pristiq.  Patient listed as having [employer].  EDCM spoke to patient at bedside.  Due to patient having insurance, EDCM unable to assist with cost of medication.  Patient does not qualify for [place] program due to insurance.  [facility] provided patient with a coupon from [employer] from goodrx.com for Pristiq.  Patient reports he has his prescription waiting at [facility] but it is 80 dollars.  [facility] asked patient to ask [facility] pharmacist to transfer rx to [facility].  [facility] informed patient coupon was for generic form.  EDCm went back to look for coupon from [place] for Pristiq, however it was more expensive at [facility].  EDCM found patient assistance program for Pristiq for 4 dollar copay card.  [facility] printed the card to give to patient, however the patient had left.  Patient reported he has a pcp and a psychiatrist at [facility].

## 2016-02-03 NOTE — Discharge Instructions (Signed)
Medicine Refill at the Emergency Department  We have refilled your medicine today, but it is best for you to get refills through your primary health care provider's office. In the future, please plan ahead so you do not need to get refills from the emergency department.  If the medicine we refilled was a maintenance medicine, you may have received only enough to get you by until you are able to see your regular health care provider.     This information is not intended to replace advice given to you by your health care provider. Make sure you discuss any questions you have with your health care provider.     Document Released: 11/25/2003 Document Revised: 08/29/2014 Document Reviewed: 11/15/2013  Elsevier Interactive Patient Education 2016 Elsevier Inc.

## 2016-02-03 NOTE — ED Provider Notes (Signed)
CSN: [id number]     Arrival date & time 02/03/16  1932 History   First MD Initiated Contact with Patient 02/03/16 2125     Chief Complaint  Patient presents with  . Depression     (Consider location/radiation/quality/duration/timing/severity/associated sxs/prior Treatment) HPI Comments: This a 50 year old male with a history of depression who has been on Pristiq for number of years, stating that this is the only antidepressant that works for him.  He was without it for the past 3 months due to financial concerns.  He was seen by his therapist on Friday you again prescribed Pristiq.  He states the pharmacy [facility]) has his refill but he cannot afford the $80.00 something dollars to retrieve it.  He came to the emergency department hoping that we would have a coupon or simply of helping him purchase his medications He said he thought he might check in for a few days just said that he get his medications.  He's not suicidal or homicidal  Patient is a 50 y.o. male presenting with depression. The history is provided by the patient.  Depression This is a chronic problem. The problem occurs constantly. The problem has been gradually worsening. Pertinent negatives include no chest pain, fever or headaches. The symptoms are aggravated by stress. He has tried nothing for the symptoms. The treatment provided no relief.    Past Medical History  Diagnosis Date  . Diabetes mellitus without complication ([place])   . Depression    Past Surgical History  Procedure Laterality Date  . Lumbar fusion    . Inguinal hernia repair     No family history on file. Social History  Substance Use Topics  . Smoking status: Current Every Day Smoker    Types: Cigarettes  . Smokeless tobacco: Never Used  . Alcohol Use: Yes     Comment: once a week    Review of Systems  Constitutional: Negative for fever.  Respiratory: Negative for shortness of breath.   Cardiovascular: Negative for chest pain.  Neurological:  Negative for dizziness and headaches.  Psychiatric/Behavioral: Positive for depression. Negative for suicidal ideas and self-injury. The patient is nervous/anxious.   All other systems reviewed and are negative.     Allergies  Review of patient's allergies indicates no known allergies.  Home Medications   Prior to Admission medications   Medication Sig Start Date End Date Taking? Authorizing Provider  citalopram (CELEXA) 40 MG tablet Take 40 mg by mouth daily.   Yes Historical Provider, MD  folic acid (FOLVITE) Q000111Q MCG tablet Take 400 mcg by mouth daily.   Yes Historical Provider, MD  hydrOXYzine (ATARAX/VISTARIL) 25 MG tablet Take 25 mg by mouth every 6 (six) hours as needed for anxiety.   Yes Historical Provider, MD  metFORMIN (GLUCOPHAGE) 500 MG tablet Take 500 mg by mouth 2 (two) times daily with a meal.  11/23/15  Yes Historical Provider, MD  desvenlafaxine (PRISTIQ) 50 MG 24 hr tablet Take 50 mg by mouth daily.    Historical Provider, MD  HYDROcodone-acetaminophen (NORCO/VICODIN) 5-325 MG tablet Take 1-2 tablets by mouth every 4 (four) hours as needed. Patient not taking: Reported on 02/03/2016 07/12/15   Nat Christen, MD  predniSONE (DELTASONE) 50 MG tablet One tab daily for 7 days Patient not taking: Reported on 02/03/2016 07/12/15   Nat Christen, MD   BP 129/79 mmHg  Pulse 85  Temp(Src) 98.4 F (36.9 C) (Oral)  Resp 18  SpO2 98% Physical Exam  Constitutional: He is oriented to person,  place, and time. He appears well-developed and well-nourished.  HENT:  Head: Normocephalic.  Eyes: Pupils are equal, round, and reactive to light.  Neck: Normal range of motion.  Cardiovascular: Normal rate and regular rhythm.   Pulmonary/Chest: Effort normal.  Musculoskeletal: Normal range of motion.  Neurological: He is alert and oriented to person, place, and time.  Skin: Skin is warm and dry.  Psychiatric: His speech is normal and behavior is normal. Judgment and thought content normal. His  mood appears anxious. Cognition and memory are normal. He exhibits a depressed mood.  Nursing note and vitals reviewed.   ED Course  Procedures (including critical care time) Labs Review Labs Reviewed - No data to display  Imaging Review No results found. I have personally reviewed and evaluated these images and lab results as part of my medical decision-making.   EKG Interpretation None     I was able to locate a coupon for $55.13 for 30 day supply of the 2 mg Pristiq.  This was given to the patient who then got up out of his chair said thank you and left.  He said he did not need his discharge papers MDM   Final diagnoses:  Encounter for medication refill  Depression         Junius Creamer, NP 02/03/16 [place], MD 02/03/16 2209

## 2016-02-03 NOTE — ED Notes (Signed)
Pt states he ran out of his antidepressant last month, states he can't afford to refill his medication. Pt states he would like help paying for his medication and outpatient counseling. Pt states he has increased stress after his mother died in 2023-05-22 and recent financial problems. Pt states he does not want inpatient treatment. Pt denies SI/HI. Pt is on Pristiq.

## 2016-04-13 ENCOUNTER — Encounter (HOSPITAL_COMMUNITY): Payer: Self-pay

## 2016-04-13 ENCOUNTER — Inpatient Hospital Stay (HOSPITAL_COMMUNITY)
Admission: EM | Admit: 2016-04-13 | Discharge: 2016-04-18 | DRG: 885 | Disposition: A | Discharge status: Home / Self Care | Payer: BLUE CROSS/BLUE SHIELD | Source: Intra-hospital | Attending: Psychiatry | Admitting: Psychiatry

## 2016-04-13 ENCOUNTER — Emergency Department (HOSPITAL_COMMUNITY)
Admission: EM | Admit: 2016-04-13 | Discharge: 2016-04-13 | Disposition: A | Discharge status: Other Acute Inpatient Hospital | Payer: BLUE CROSS/BLUE SHIELD | Attending: Emergency Medicine | Admitting: Emergency Medicine

## 2016-04-13 DIAGNOSIS — F419 Anxiety disorder, unspecified: Secondary | ICD-10-CM | POA: Diagnosis present

## 2016-04-13 DIAGNOSIS — F332 Major depressive disorder, recurrent severe without psychotic features: Principal | ICD-10-CM | POA: Diagnosis present

## 2016-04-13 DIAGNOSIS — R45851 Suicidal ideations: Secondary | ICD-10-CM | POA: Diagnosis present

## 2016-04-13 DIAGNOSIS — Z79899 Other long term (current) drug therapy: Secondary | ICD-10-CM

## 2016-04-13 DIAGNOSIS — F17210 Nicotine dependence, cigarettes, uncomplicated: Secondary | ICD-10-CM | POA: Insufficient documentation

## 2016-04-13 DIAGNOSIS — F1910 Other psychoactive substance abuse, uncomplicated: Secondary | ICD-10-CM

## 2016-04-13 DIAGNOSIS — F1210 Cannabis abuse, uncomplicated: Secondary | ICD-10-CM | POA: Diagnosis present

## 2016-04-13 DIAGNOSIS — K219 Gastro-esophageal reflux disease without esophagitis: Secondary | ICD-10-CM | POA: Diagnosis present

## 2016-04-13 DIAGNOSIS — E119 Type 2 diabetes mellitus without complications: Secondary | ICD-10-CM | POA: Diagnosis present

## 2016-04-13 DIAGNOSIS — Z7984 Long term (current) use of oral hypoglycemic drugs: Secondary | ICD-10-CM | POA: Diagnosis not present

## 2016-04-13 DIAGNOSIS — G4700 Insomnia, unspecified: Secondary | ICD-10-CM | POA: Diagnosis present

## 2016-04-13 DIAGNOSIS — Z915 Personal history of self-harm: Secondary | ICD-10-CM

## 2016-04-13 DIAGNOSIS — Z818 Family history of other mental and behavioral disorders: Secondary | ICD-10-CM | POA: Diagnosis not present

## 2016-04-13 DIAGNOSIS — F609 Personality disorder, unspecified: Secondary | ICD-10-CM | POA: Diagnosis present

## 2016-04-13 DIAGNOSIS — F329 Major depressive disorder, single episode, unspecified: Secondary | ICD-10-CM | POA: Diagnosis not present

## 2016-04-13 DIAGNOSIS — F411 Generalized anxiety disorder: Secondary | ICD-10-CM | POA: Diagnosis not present

## 2016-04-13 LAB — COMPREHENSIVE METABOLIC PANEL
ALBUMIN: 4.7 g/dL (ref 3.5–5.0)
ALT: 23 U/L (ref 17–63)
ANION GAP: 6 (ref 5–15)
AST: 25 U/L (ref 15–41)
Alkaline Phosphatase: 83 U/L (ref 38–126)
BILIRUBIN TOTAL: 1.2 mg/dL (ref 0.3–1.2)
BUN: 15 mg/dL (ref 6–20)
CHLORIDE: 106 mmol/L (ref 101–111)
CO2: 25 mmol/L (ref 22–32)
CREATININE: 0.89 mg/dL (ref 0.61–1.24)
Calcium: 9.6 mg/dL (ref 8.9–10.3)
GFR calc Af Amer: >60 mL/min (ref >60)
Glucose, Bld: 113 mg/dL — ABNORMAL HIGH (ref 65–99)
POTASSIUM: 3.7 mmol/L (ref 3.5–5.1)
Sodium: 137 mmol/L (ref 135–145)
Total Protein: 8.3 g/dL — ABNORMAL HIGH (ref 6.5–8.1)

## 2016-04-13 LAB — CBC
HEMATOCRIT: 44.1 % (ref 39.0–52.0)
HEMOGLOBIN: 15.8 g/dL (ref 13.0–17.0)
MCH: 32 pg (ref 26.0–34.0)
MCHC: 35.8 g/dL (ref 30.0–36.0)
MCV: 89.3 fL (ref 78.0–100.0)
PLATELETS: 244 10*3/uL (ref 150–400)
RBC: 4.94 MIL/uL (ref 4.22–5.81)
RDW: 12.6 % (ref 11.5–15.5)
WBC: 8.4 10*3/uL (ref 4.0–10.5)

## 2016-04-13 LAB — RAPID URINE DRUG SCREEN, HOSP PERFORMED
AMPHETAMINES: NOT DETECTED
BARBITURATES: NOT DETECTED
Benzodiazepines: NOT DETECTED
Cocaine: NOT DETECTED
Opiates: NOT DETECTED
TETRAHYDROCANNABINOL: POSITIVE — AB

## 2016-04-13 LAB — ACETAMINOPHEN LEVEL: Acetaminophen (Tylenol), Serum: <10 ug/mL — ABNORMAL LOW (ref 10–30)

## 2016-04-13 LAB — ETHANOL

## 2016-04-13 LAB — SALICYLATE LEVEL: Salicylate Lvl: <4 mg/dL (ref 2.8–30.0)

## 2016-04-13 MED ORDER — CITALOPRAM HYDROBROMIDE 40 MG PO TABS
40.0000 mg | ORAL_TABLET | Freq: Every day | ORAL | Status: DC
  Administered 2016-04-14 – 2016-04-18 (×5): 40 mg via ORAL
  Filled 2016-04-13 (×2): qty 1
  Filled 2016-04-13: qty 2
  Filled 2016-04-13 (×6): qty 1

## 2016-04-13 MED ORDER — LORAZEPAM 2 MG/ML IJ SOLN
1.0000 mg | Freq: Once | INTRAMUSCULAR | Status: AC
  Administered 2016-04-13: 1 mg via INTRAMUSCULAR
  Filled 2016-04-13: qty 1

## 2016-04-13 MED ORDER — NICOTINE 21 MG/24HR TD PT24
21.0000 mg | MEDICATED_PATCH | Freq: Every day | TRANSDERMAL | Status: DC
  Filled 2016-04-13: qty 1

## 2016-04-13 MED ORDER — ONDANSETRON HCL 4 MG PO TABS: 4.0000 mg | ORAL_TABLET | Freq: Three times a day (TID) | ORAL | Status: DC | PRN

## 2016-04-13 MED ORDER — ALUM & MAG HYDROXIDE-SIMETH 200-200-20 MG/5ML PO SUSP: 30.0000 mL | ORAL | Status: DC | PRN

## 2016-04-13 MED ORDER — IBUPROFEN 200 MG PO TABS: 600.0000 mg | ORAL_TABLET | Freq: Three times a day (TID) | ORAL | Status: DC | PRN

## 2016-04-13 MED ORDER — ACETAMINOPHEN 325 MG PO TABS: 650.0000 mg | ORAL_TABLET | ORAL | Status: DC | PRN

## 2016-04-13 MED ORDER — CITALOPRAM HYDROBROMIDE 10 MG PO TABS
40.0000 mg | ORAL_TABLET | Freq: Every day | ORAL | Status: DC
  Administered 2016-04-13: 40 mg via ORAL
  Filled 2016-04-13: qty 4

## 2016-04-13 MED ORDER — METFORMIN HCL 500 MG PO TABS
500.0000 mg | ORAL_TABLET | Freq: Two times a day (BID) | ORAL | Status: DC
  Administered 2016-04-13: 500 mg via ORAL
  Filled 2016-04-13: qty 1

## 2016-04-13 MED ORDER — MAGNESIUM HYDROXIDE 400 MG/5ML PO SUSP: 30.0000 mL | Freq: Every day | ORAL | Status: DC | PRN

## 2016-04-13 MED ORDER — METFORMIN HCL 500 MG PO TABS
500.0000 mg | ORAL_TABLET | Freq: Two times a day (BID) | ORAL | Status: DC
  Administered 2016-04-13 – 2016-04-18 (×10): 500 mg via ORAL
  Filled 2016-04-13 (×16): qty 1

## 2016-04-13 MED ORDER — HYDROXYZINE HCL 25 MG PO TABS
25.0000 mg | ORAL_TABLET | Freq: Four times a day (QID) | ORAL | Status: DC | PRN
  Administered 2016-04-13 – 2016-04-17 (×3): 25 mg via ORAL
  Filled 2016-04-13 (×4): qty 1

## 2016-04-13 MED ORDER — TRAZODONE HCL 50 MG PO TABS
50.0000 mg | ORAL_TABLET | Freq: Every evening | ORAL | Status: DC | PRN
  Administered 2016-04-13 – 2016-04-17 (×6): 50 mg via ORAL
  Filled 2016-04-13 (×15): qty 1

## 2016-04-13 MED ORDER — ZOLPIDEM TARTRATE 5 MG PO TABS: 5.0000 mg | ORAL_TABLET | Freq: Every evening | ORAL | Status: DC | PRN

## 2016-04-13 MED ORDER — LORAZEPAM 1 MG PO TABS: 1.0000 mg | ORAL_TABLET | Freq: Three times a day (TID) | ORAL | Status: DC | PRN

## 2016-04-13 NOTE — Tx Team (Signed)
Initial Treatment Plan 04/13/2016 2:17 PM GRAIG CHARLESTON [id number]    PATIENT STRESSORS: Financial difficulties Legal issue Loss of parents Marital or family conflict Occupational concerns Substance abuse   PATIENT STRENGTHS: Curator fund of knowledge Motivation for treatment/growth   PATIENT IDENTIFIED PROBLEMS: Depression  Anxiety  Suicidal ideation  Substance abuse (marijuana)  "I want to be successful"  "I want to make my Mom and Dad proud"           DISCHARGE CRITERIA:  Improved stabilization in mood, thinking, and/or behavior Verbal commitment to aftercare and medication compliance  PRELIMINARY DISCHARGE PLAN: Outpatient therapy Medication management  PATIENT/FAMILY INVOLVEMENT: This treatment plan has been presented to and reviewed with the patient, RAWLIN ANTOLINI.  The patient and family have been given the opportunity to ask questions and make suggestions.  Windell Moment, RN 04/13/2016, 2:17 PM

## 2016-04-13 NOTE — BH Assessment (Signed)
[facility] Assessment Progress Note  Per Patriciaann Clan, PA, this pt requires psychiatric hospitalization at this time.  Inocencio Homes, RN, [facility] has assigned pt to [facility] Rm 304-1.  Pt has signed Voluntary Admission and Consent for Treatment, as well as Consent to Release Information to Dr Randel Books at [facility] in [place], and a notification call has been placed.  Signed forms have been faxed to [facility].  Pt's nurse, Gerrit Friends, has been notified, and agrees to send original paperwork along with pt via [facility], and to call report to [phone].  Jalene Mullet, [place] Triage Specialist [phone]

## 2016-04-13 NOTE — [facility] Group Notes (Signed)
[facility] LCSW Group Therapy 04/13/2016  1:15 PM Type of Therapy: Group Therapy Participation Level: Minimal  Participation Quality: Attentive  Affect: Appropriate  Cognitive: Alert and Oriented  Insight: Developing/Improving and Engaged  Engagement in Therapy: Developing/Improving and Engaged  Modes of Intervention: Clarification, Confrontation, Discussion, Education, Exploration, Limit-setting, Orientation, Problem-solving, Rapport Building, Art therapist, Socialization and Support  Summary of Progress/Problems: The topic for group today was emotional regulation. This group focused on both positive and negative emotion identification and allowed group members to process ways to identify feelings, regulate negative emotions, and find healthy ways to manage internal/external emotions. Group members were asked to reflect on a time when their reaction to an emotion led to a negative outcome and explored how alternative responses using emotion regulation would have benefited them. Group members were also asked to discuss a time when emotion regulation was utilized when a negative emotion was experienced. Patient participated minimally in group discussion despite CSW encouragement.   Tilden Fossa, MSW, [place] Clinical Social Worker [facility] [phone]
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
## 2016-04-13 NOTE — ED Notes (Signed)
Patient has been calm and cooperative thus far.  Transferred to [employer] via [employer].  All belongings given to the driver.

## 2016-04-13 NOTE — ED Notes (Signed)
Bed: [facility] Expected date:  Expected time:  Means of arrival:  Comments: 
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
## 2016-04-13 NOTE — Progress Notes (Signed)
D: Pt denies SI/HI/AVH. Pt is pleasant and cooperative. Pt stated he felt better this evening because he was in a safe environment. Pt stated he took some Seroquel in the past and had a bad experience with it, so he stated he would try other medications, but was against Seroquel.   A: Pt was offered support and encouragement. Pt was given scheduled medications. Pt was encourage to attend groups. Q 15 minute checks were done for safety.   R:Pt attends groups and interacts well with peers and staff. Pt is taking medication. Pt has no complaints.Pt receptive to treatment and safety maintained on unit.

## 2016-04-13 NOTE — ED Notes (Signed)
Patient admits SI without a plan. Patient denies HI, and AVH. Plan of care discussed with a patient. Patient voices no complaints or concerns at this time.  Encouragement and support provided and safety maintain. Q 15 min safety checks in place and video monitoring.
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
## 2016-04-14 ENCOUNTER — Other Ambulatory Visit: Payer: Self-pay

## 2016-04-14 MED ORDER — OLANZAPINE 2.5 MG PO TABS
2.5000 mg | ORAL_TABLET | Freq: Every day | ORAL | Status: DC
  Administered 2016-04-14 – 2016-04-17 (×4): 2.5 mg via ORAL
  Filled 2016-04-14 (×6): qty 1

## 2016-04-14 MED ORDER — GLUCERNA SHAKE PO LIQD
237.0000 mL | Freq: Three times a day (TID) | ORAL | Status: DC
  Administered 2016-04-14 – 2016-04-18 (×11): 237 mL via ORAL

## 2016-04-14 NOTE — BHH Suicide Risk Assessment (Signed)
[place] INPATIENT:  Family/Significant Other Suicide Prevention Education  Suicide Prevention Education:  Education Completed; Marrianne Mood, [phone],  (name of family member/significant other) has been identified by the patient as the family member/significant other with whom the patient will be residing, and identified as the person(s) who will aid the patient in the event of a mental health crisis (suicidal ideations/suicide attempt).  With written consent from the patient, the family member/significant other has been provided the following suicide prevention education, prior to the and/or following the discharge of the patient.  The suicide prevention education provided includes the following:  Suicide risk factors  Suicide prevention and interventions  National Suicide Hotline telephone number  [facility] assessment telephone number  [facility] Emergency Assistance [place] and/or Residential Mobile Crisis Unit telephone number  Request made of family/significant other to:  Remove weapons (e.g., guns, rifles, knives), all items previously/currently identified as safety concern.    Remove drugs/medications (over-the-counter, prescriptions, illicit drugs), all items previously/currently identified as a safety concern.  The family member/significant other verbalizes understanding of the suicide prevention education information provided.  The family member/significant other agrees to remove the items of safety concern listed above.  Per sister, states patient is "playing all of [place], my parents enabled him for about a decade."  Parents are now deceased, pt was living w parents and assisted w their care during the day.  Patient "snapped", wants siblings to "bail him out of some financial problems."  Family wants to set boundaries w patient and discontinue financial assistance.  Want him to "prove" that he is stable and not abusing substances.  Family is unwilling  to provide housing to patient at discharge due to past issues w patient.  States patient was hospitalized on several occasions in psychiatric hospital (recently in  2014 in at [facility]).  Was evicted from housing in [place] prior to move to [place].  States family "has made it clear that we will not help you unless you help yourself."  Family has tried to "do an intervention" which "blew up in our face."  Have had no contact w patient for past 2 months. Has past history of suicide attempt in mother's home prior to her death.  Family is "emotionally drained" due to "so much drama."   Patient's mother suffered from "severe depression" and died of ovarian cancer 2015/05/14.  Family plans to communicate limits and boundaries to patient re ongoing financial support.  Will contact patient by phone or in person while in the hospital to discuss "ground rules and what you can expect from [place]."  Beverely Pace 04/14/2016, 6:13 PM

## 2016-04-14 NOTE — Progress Notes (Signed)
NUTRITION ASSESSMENT  Pt identified as at risk on the Malnutrition Screen Tool  INTERVENTION: 1. Supplements: Glucerna Shake po TID, each supplement provides 220 kcal and 10 grams of protein 2. Consult for education, if patient is appropriate.  NUTRITION DIAGNOSIS: Unintentional weight loss related to sub-optimal intake as evidenced by pt report.   Goal: Pt to meet >/= 90% of their estimated nutrition needs.  Monitor:  PO intake  Assessment:  Pt admitted with depression. PMHx of diabetes. Pt reports not following diabetes diet. Pt with suicidal ideation of not eating.  Patient may need diet education, please consult if feel appropriate for education. RD to order Glucerna shakes TID.   Height: Ht Readings from Last 1 Encounters:  04/13/16 5\' 10"  (1.778 m)    Weight: Wt Readings from Last 1 Encounters:  04/13/16 158 lb (71.7 kg)    Weight Hx: Wt Readings from Last 10 Encounters:  04/13/16 158 lb (71.7 kg)    BMI:  Body mass index is 22.67 kg/m. Pt meets criteria for normal range based on current BMI.  Estimated Nutritional Needs: Kcal: 25-30 kcal/kg Protein: > 1 gram protein/kg Fluid: 1 ml/kcal  Diet Order: Diet regular Room service appropriate? Yes; Fluid consistency: Thin Pt is also offered choice of unit snacks mid-morning and mid-afternoon.  Pt is eating as desired.   Lab results and medications reviewed.   Clayton Bibles, MS, RD, LDN Pager: [phone] After Hours Pager: [phone]

## 2016-04-14 NOTE — BHH Group Notes (Signed)
Adult Psychoeducational Group Note  Date:  04/14/2016 Time:  9:09 PM  Group Topic/Focus:  Wrap-Up Group:   The focus of this group is to help patients review their daily goal of treatment and discuss progress on daily workbooks.   Participation Level:  Active  Participation Quality:  Appropriate  Affect:  Appropriate  Cognitive:  Alert  Insight: Appropriate and Good  Engagement in Group:  Engaged  Modes of Intervention:  Activity   Additional Comments:  Pt stated that he had a pretty good day and a positive thing about hisself  Is that he is very kind hearted.  His depression is a 4 out of 10. No Si/Hi.  Harvin Hazel A 04/14/2016, 9:09 PM

## 2016-04-14 NOTE — Tx Team (Signed)
Interdisciplinary Treatment and Diagnostic Plan Update  04/14/2016 Time of Session: 9:30am BERDELL PETRELLA MRN: [id number]  Principal Diagnosis: <principal problem not specified>  Secondary Diagnoses: Active Problems:   Major depressive disorder, recurrent episode, severe, with psychosis ([place])   Current Medications:  Current Facility-Administered Medications  Medication Dose Route Frequency Provider Last Rate Last Dose  . alum & mag hydroxide-simeth (MAALOX/MYLANTA) 200-200-20 MG/5ML suspension 30 mL  30 mL Oral Q4H PRN Patrecia Pour, NP      . citalopram (CELEXA) tablet 40 mg  40 mg Oral Daily Patrecia Pour, NP   40 mg at 04/14/16 0751  . hydrOXYzine (ATARAX/VISTARIL) tablet 25 mg  25 mg Oral Q6H PRN Laverle Hobby, PA-C   25 mg at 04/13/16 2302  . magnesium hydroxide (MILK OF MAGNESIA) suspension 30 mL  30 mL Oral Daily PRN Patrecia Pour, NP      . metFORMIN (GLUCOPHAGE) tablet 500 mg  500 mg Oral BID WC Patrecia Pour, NP   500 mg at 04/14/16 0751  . traZODone (DESYREL) tablet 50 mg  50 mg Oral QHS,MR X 1 Laverle Hobby, PA-C   50 mg at 04/13/16 2302   PTA Medications: Prescriptions Prior to Admission  Medication Sig Dispense Refill Last Dose  . citalopram (CELEXA) 40 MG tablet Take 40 mg by mouth daily.   Past Month at Unknown time  . desvenlafaxine (PRISTIQ) 50 MG 24 hr tablet Take 50 mg by mouth daily.   Past Month at Unknown time  . metFORMIN (GLUCOPHAGE) 500 MG tablet Take 500 mg by mouth 2 (two) times daily with a meal.   2 04/12/2016 at Unknown time    Treatment Modalities: Medication Management, Group therapy, Case management,  1 to 1 session with clinician, Psychoeducation, Recreational therapy.   Physician Treatment Plan for Primary Diagnosis: <principal problem not specified> Long Term Goal(s): Improvement in symptoms so as ready for discharge   Short Term Goals: Ability to verbalize feelings will improve and Ability to disclose and discuss suicidal  ideas  Medication Management: Evaluate patient's response, side effects, and tolerance of medication regimen.  Therapeutic Interventions: 1 to 1 sessions, Unit Group sessions and Medication administration.  Evaluation of Outcomes: Progressing  Physician Treatment Plan for Secondary Diagnosis: Active Problems:   Major depressive disorder, recurrent episode, severe, with psychosis ([place])  Long Term Goal(s): Improvement in symptoms so as ready for discharge  Short Term Goals: Ability to identify changes in lifestyle to reduce recurrence of condition will improve, Ability to demonstrate self-control will improve and Ability to identify and develop effective coping behaviors will improve  Medication Management: Evaluate patient's response, side effects, and tolerance of medication regimen.  Therapeutic Interventions: 1 to 1 sessions, Unit Group sessions and Medication administration.  Evaluation of Outcomes: Progressing   RN Treatment Plan for Primary Diagnosis: <principal problem not specified> Long Term Goal(s): Knowledge of disease and therapeutic regimen to maintain health will improve  Short Term Goals: Ability to verbalize frustration and anger appropriately will improve, Ability to verbalize feelings will improve and Ability to disclose and discuss suicidal ideas  Medication Management: RN will administer medications as ordered by provider, will assess and evaluate patient's response and provide education to patient for prescribed medication. RN will report any adverse and/or side effects to prescribing provider.  Therapeutic Interventions: 1 on 1 counseling sessions, Psychoeducation, Medication administration, Evaluate responses to treatment, Monitor vital signs and CBGs as ordered, Perform/monitor CIWA, COWS, AIMS and Fall Risk screenings as ordered,  Perform wound care treatments as ordered.  Evaluation of Outcomes: Progressing   LCSW Treatment Plan for Primary Diagnosis:  <principal problem not specified> Long Term Goal(s): Safe transition to appropriate next level of care at discharge, Engage patient in therapeutic group addressing interpersonal concerns.  Short Term Goals: Engage patient in aftercare planning with referrals and resources, Increase social support and Increase emotional regulation  Therapeutic Interventions: Assess for all discharge needs, 1 to 1 time with Social worker, Explore available resources and support systems, Assess for adequacy in community support network, Educate family and significant other(s) on suicide prevention, Complete Psychosocial Assessment, Interpersonal group therapy.  Evaluation of Outcomes: Progressing   Progress in Treatment :  Attending groups: Continuing to assess  Participating in groups: Continuing to assess  Taking medication as prescribed: Yes, MD continuing to assess for appropriate medication regimen  Toleration medication: Yes  Family/Significant other contact made: Treatment team assessing for appropriate contacts  Patient understands diagnosis: Yes  Discussing patient identified problems/goals with staff: Yes  Medical problems stabilized or resolved: Yes  Denies suicidal/homicidal ideation: Treatment team continuing to asses  Issues/concerns per patient self-inventory: None reported  Other: N/A  New problem(s) identified: None reported at this time    New Short Term/Long Term Goal(s): None at this time    Discharge Plan or Barriers: Treatment team continuing to assess.    Reason for Continuation of Hospitalization:  Anxiety  Depression  Hallucinations  Mania  Medication stabilization  Suicidal ideation  Withdrawal symptoms   Estimated Length of Stay: 3-5 days    Attendees:  Patient:                         Physician: Dr. Modesta Messing, MD  04/14/2016   9:30am  Nursing: Elesa Massed, Kerby Nora, RN         04/14/2016 9:30am  RN Care Manager:   Social Workers:  Peri Maris, LCSW, Zuria Fosdick McKinley, LCSW  04/14/2016 9:30am  Nurse Pratictioners: Samuel Jester, NP, Lindell Spar, NP 04/14/2016 9:30am  Scribe for Treatment Team: Tilden Fossa, [place] Worker [facility] [phone]

## 2016-04-14 NOTE — BHH Counselor (Signed)
Adult Comprehensive Assessment  Patient ID: Edgar White, male   DOB: [birth year], 50 y.o.   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  Family Relationships: estranged from siblings since his parents deaths  Museum/gallery curator / Lack of resources (include bankruptcy): no income, car recently [employer] / Lack of housing: homeless, staying with an acquaintance in [place] since beginning of August Physical health (include injuries & life threatening diseases): diabetes Social relationships: Limited social supports  Substance abuse: THC daily, occaionally binge drinks every several months  Bereavement / Loss: Father died in 12/09/12 and mother died in 10-Dec-2014, recent break up in August 2017  Living/Environment/Situation:  Living Arrangements: Non-relatives/Friends Living conditions (as described by patient or guardian): homeless, staying with an acquaintance in [place] since beginning of August What is atmosphere in current home: Temporary  Family History:  Marital status: Single (recent break up after 8 months in August 2017) What is your sexual orientation?: Gay  Childhood History:  By whom was/is the patient raised?: Both parents Description of patient's relationship with caregiver when they were a child: Close with parents, closer with his mother  Does patient have siblings?: Yes Number of Siblings: 3 Description of patient's current relationship with siblings: estranged from siblings  Did patient suffer any verbal/emotional/physical/sexual abuse as a child?: No Did patient suffer from severe childhood neglect?: No Has patient ever been sexually abused/assaulted/raped as an adolescent or adult?: No Was the patient ever a victim of a crime or a disaster?: No Witnessed domestic violence?:  No Has patient been effected by domestic violence as an adult?: No  Education:  Highest grade of school patient has completed: Gaffer in Multimedia programmer Currently a student?: No Learning disability?: Yes (diagnosed with ADHD in 12/09/01 )  Employment/Work Situation:   Employment situation: Unemployed Patient's job has been impacted by current illness: Yes What is the longest time patient has a held a job?: 7 years Where was the patient employed at that time?: Therapist, 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 Has patient ever been in the [employer]?: No Has patient ever served in Recruitment consultant?: No  Financial Resources:   Museum/gallery curator resources: No income Does patient have a Programmer, applications or guardian?: No  Alcohol/Substance Abuse:   What has been your use of drugs/alcohol within the last 12 months?: Daily THC use  If attempted suicide, did drugs/alcohol play a role in this?: No Alcohol/Substance Abuse Treatment Hx: Denies past history Has alcohol/substance abuse ever caused legal problems?: No  Social Support System:   Heritage manager System: None Describe Community Support System: Denies  Type of faith/religion: Christian How does patient's faith help to cope with current illness?: Relies on his faith   Leisure/Recreation:   Leisure and Hobbies: used to exercise, spending time outdoors  Strengths/Needs:   What things does the patient do well?: "I'm just a good person that just gets walked all over", good at decorating  In what areas does patient struggle / problems for patient: lack of appetite, loss of identity since his mother's death because he was her caregiver. isolated, loss interest and motivation  Discharge Plan:   Does patient have access to transportation?: No Will patient be returning to same living situation after discharge?: Yes Currently receiving community mental health services: No If no, would patient like referral for services when discharged?: Yes  (What  county?) Does patient have financial barriers related to discharge medications?: No  Summary/Recommendations:     Patient is a 50 year old male who presented to the hospital with depression and SI. Primary triggers for admission include housing,financial, legal, and family stressors. Patient will benefit from crisis stabilization, medication evaluation, group therapy and psycho education in addition to case management for discharge planning. At discharge, it is recommended that Pt remain compliant with established discharge plan and continued treatment.   Edgar White. 04/14/2016

## 2016-04-14 NOTE — [facility] Group Notes (Signed)
[facility] Group Notes:  (Nursing/MHT/Case Management/Adjunct)  Date:  04/14/2016  Time:  9:24 AM  Type of Therapy:  Nurse Education  Participation Level:  Did Not Attend  Summary of Progress/Problems: Pt was invited over loudspeaker and by personal invitation from this writer but did not attend.  Marya Landry 04/14/2016, 9:24 AM

## 2016-04-14 NOTE — Progress Notes (Signed)
D: Gioacchino has been calm, cooperative, and compliant with medications today. He denied SI/HI/AVH. 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 He said this was not an issue, however. "You're the good witch," he said. Though he slept in during nurse education group, he has been present in other groups and went outside during rec time.   A: Meds given as ordered. EKG obtained. Q15 safety checks maintained. Support/encouragement offered.  R: Pt remains free from harm and continues with treatment. Will continue to monitor for needs/safety.
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
## 2016-04-15 LAB — LIPID PANEL
CHOL/HDL RATIO: 6.1 ratio
CHOLESTEROL: 207 mg/dL — AB (ref 0–200)
HDL: 34 mg/dL — ABNORMAL LOW (ref >40)
LDL Cholesterol: 138 mg/dL — ABNORMAL HIGH (ref 0–99)
TRIGLYCERIDES: 174 mg/dL — AB (ref <150)
VLDL: 35 mg/dL (ref 0–40)

## 2016-04-15 LAB — TSH: TSH: 2.301 u[IU]/mL (ref 0.350–4.500)

## 2016-04-15 NOTE — Progress Notes (Signed)
D: Edgar White rates Anxiety 5/10 and Depression 6/10. His goal today was to "just improve on getting better". Denies SI/HI/AVH at this time. Contracts for safety.  A: Encouragement and support given. Q15 minute room checks for patient safety. Medications administered as prescribed.  R: Continue to monitor for patient safety and medication effectiveness.

## 2016-04-15 NOTE — Clinical Social Work Note (Signed)
Sister Marrianne Mood called, states family is not able to provide support to patient at discharge.  CSW encouraged family to discuss their concerns directly w patient.  Edwyna Shell, LCSW Lead Clinical Social Worker Phone:  [phone]

## 2016-04-15 NOTE — Progress Notes (Signed)
Recreation Therapy Notes  Date: 04/15/16 Time: 0945 Location: [address] Group Room  Group Topic: Stress Management  Goal Area(s) Addresses:  Patient will verbalize importance of using healthy stress management.  Patient will identify positive emotions associated with healthy stress management.   Intervention: Stress Management  Activity :  Progressive Muscle Relaxation.  LRT introduced the technique of progressive muscle relaxation to patients.  Patients were to follow along as LRT read script to engaged in the technique.  Education:  Stress Management, Discharge Planning.   Education Outcome: Acknowledges edcuation/In group clarification offered/Needs additional education  Clinical Observations/Feedback: Pt did not attend group.    Victorino Sparrow, LRT/CTRS   Ria Comment, Lilie Vezina A 04/15/2016 1:00 PM
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
## 2016-04-15 NOTE — BHH Group Notes (Signed)
Late entry from 8/24:      [facility] LCSW Group Therapy 04/14/16  1:15 pm  Type of Therapy: Group Therapy Participation Level: Minimal  Participation Quality: Attentive  Affect: Blunted  Cognitive: Alert and Oriented  Insight: Developing/Improving and Engaged  Engagement in Therapy: Developing/Improving and Engaged  Modes of Intervention: Clarification, Confrontation, Discussion, Education, Exploration,  Limit-setting, Orientation, Problem-solving, Rapport Building, Art therapist, Socialization and Support  Summary of Progress/Problems: CSW and patients reviewed community resources including AA/NA groups, [facility] [place] support groups, housing resources, and grief counseling. Patients discussed which programs would be beneficial for them in their recovery. Patient participated minimally in group discussion.  Tilden Fossa, LCSW Clinical Social Worker [facility] [phone]
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
## 2016-04-15 NOTE — Progress Notes (Signed)
The patient attended this evening's A. A. Meeting and was appropriate.  

## 2016-04-15 NOTE — BHH Group Notes (Signed)
[place] LCSW Group Therapy  04/15/2016 5:35 PM  Type of Therapy:  Group Therapy  Participation Level:  Active   Participation Quality:  Attentive  Affect:  Tearful   Cognitive:  Alert  Insight:  Improving   Engagement in Therapy:  Improving   Modes of Intervention:  Discussion, Education, Socialization and Support  Summary of Progress/Problems: Feelings around Relapse. Group members discussed the meaning of relapse and shared personal stories of relapse, how it affected them and others, and how they perceived themselves during this time. Group members were encouraged to identify triggers, warning signs and coping skills used when facing the possibility of relapse. Social supports were discussed and explored in detail. Pt attended group and stayed the entire time. Pt identified triggers such as his current law suit, homelessness and lack of income. He was tearful throughout group. He expressed hopelessness.   [place] MSW, [place]  04/15/2016, 5:35 PM

## 2016-04-15 NOTE — Progress Notes (Signed)
Nursing Note 04/15/2016 [id number]  Data Reports sleeping good with sleep med.  Rates depression 5/10, hopelessness 4/10, and anxiety 4/10. Affect appropriate to situation mood "depressed."  Denied HI, SI, AVH.  Denied physical complaints today.  Patient observed in day area during free time, appropriate and polite with staff.  Attending groups.  Reports he likes the comradery between him and the other patients.  Action Spoke with patient 1:1, nurse offered support to patient throughout shift.  Continues to be monitored on 15 minute checks for safety.  Response Remains safe and appropriate on unit.

## 2016-04-15 NOTE — BHH Group Notes (Signed)
[place] LCSW Group Therapy  04/15/2016 5:33 PM  Type of Therapy:  Group Therapy  Participation Level:  Did Not Attend  Modes of Intervention:  Discussion, Education, Socialization and Support  Summary of Progress/Problems: Feelings around Relapse. Group members discussed the meaning of relapse and shared personal stories of relapse, how it affected them and others, and how they perceived themselves during this time. Group members were encouraged to identify triggers, warning signs and coping skills used when facing the possibility of relapse. Social supports were discussed and explored in detail.  [place] MSW, [place]  04/15/2016, 5:33 PM

## 2016-04-16 ENCOUNTER — Encounter (HOSPITAL_COMMUNITY): Payer: Self-pay | Admitting: Registered Nurse

## 2016-04-16 DIAGNOSIS — F411 Generalized anxiety disorder: Secondary | ICD-10-CM

## 2016-04-16 DIAGNOSIS — F332 Major depressive disorder, recurrent severe without psychotic features: Principal | ICD-10-CM

## 2016-04-16 DIAGNOSIS — F1910 Other psychoactive substance abuse, uncomplicated: Secondary | ICD-10-CM

## 2016-04-16 LAB — GLUCOSE, CAPILLARY: GLUCOSE-CAPILLARY: 254 mg/dL — AB (ref 65–99)

## 2016-04-16 LAB — HEMOGLOBIN A1C
Hgb A1c MFr Bld: 6.5 % — ABNORMAL HIGH (ref 4.8–5.6)
Mean Plasma Glucose: 140 mg/dL

## 2016-04-16 NOTE — Progress Notes (Signed)
Pt approached Probation officer regarding HIPPA information and concerned about sister Marrianne Mood and her getting information about his care.  Pt sates that social worker mentioned that sister called and wanted a call back but patient does not want any information discussed with particular sister about his care.  Writer explained that information would be documented in his chart and passed along to his nurse.   Rick Duff, MHT

## 2016-04-16 NOTE — BHH Group Notes (Signed)
[place] Group Notes:  (Nursing--Healthy Coping Skills)  Date:  04/16/2016  Time: 0900 Type of Therapy:  Nurse Education  Participation Level:  Active  Participation Quality:  Appropriate, Attentive, Sharing and Supportive  Affect:  Depressed and Tearful  Cognitive:  Alert and Oriented  Insight:  Appropriate and Good  Engagement in Group:  Engaged  Modes of Intervention:  Discussion, Education, Orientation and Support  Summary of Progress/Problems: Pt attended scheduled groups and was engaged throughout this session.     Meryle Ready, Nicoletta Dress 04/16/2016, 0900

## 2016-04-16 NOTE — Progress Notes (Signed)
D.  Pt pleasant on approach, denies complaints at this time.  Pt did retrieve numbers from phone and made calls he had wished to make regarding care of his dog this evening.  Pt voiced no concerns after calls.  Pt spoke with MHT about HIPPA law and made it clear he does not want his sister, Marrianne Mood, to receive any information  Regarding his care here.  Will pass this on in report also.  Pt was positive for evening AA group, observed interacting appropriately with peers on unit.  Pt denies SI/HI/hallucinations at this time.  A.  Support and encouragement offered, medication given as ordered  R.  Pt remains safe on the unit, will continue to monitor.

## 2016-04-16 NOTE — Progress Notes (Signed)
D: Pt A & O X4. Rates his depression 4/10, hopelessness 5/10 and anxiety 3/10 on self inventory sheet. Pt's goal for today "Think about my plan for d/c and options for housing". Denies SI, HI, AVH and pain when assessed. Observed to be tearful with flat affect during groups.  A: Scheduled medications administered as prescribed. Support and availability provided to pt. Q 15 minutes checks maintained for safety without outburst or self harm gestures to note thus far.  R: Pt receptive to care. Compliant with medications as ordered. Denies adverse drug reactions. Attended scheduled groups. Went off unit for meals. Remains safe on and off unit without issues.

## 2016-04-16 NOTE — Progress Notes (Signed)
[facility] MD Progress Note  04/16/2016 4:11 PM Edgar White  MRN:  [id number]    Subjective:  "All of this disruption that is going on is adding to my anxiety.  I feel sorry for you today." Patient seen by this provider and chart reviewed 04/16/16.  On evaluation:  Edgar White reports that he has a worsening of anxiety related to the "disruptions that have been happening on the unit today.  I know you know who I'm talking about; It's just been pretty bad today."  Patient states that he has a court date coming up and that he has no support from family since his mother died.  States that his stressors are related to him losing his car and job and feel that his siblings have turned against him.   Reports that he is eating/sleeping without difficulty; tolerating medications without adverse reactions; and attending/participating in group sessions.  At this time patient denies suicidal/homicidal ideation, psychosis, and paranoia.   Principal Problem: Major depressive disorder, recurrent severe without psychotic features ([place]) Diagnosis:   Patient Active Problem List   Diagnosis Date Noted  . Major depressive disorder, recurrent severe without psychotic features ([place]) [F33.2] 04/13/2016   Total Time spent with patient: 20 minutes  Past Psychiatric History: MDD, recurrent. Benzodiazepine dependence, personality disorder  Past Medical History:  Past Medical History:  Diagnosis Date  . Depression   . Diabetes mellitus without complication [facility]     Past Surgical History:  Procedure Laterality Date  . INGUINAL HERNIA REPAIR    . LUMBAR FUSION     Family History: History reviewed. No pertinent family history. Family Psychiatric  History: Denies Social History:  History  Alcohol Use  . Yes    Comment: once a week     History  Drug use: Unknown    Social History   Social History  . Marital status: Single    Spouse name: N/A  . Number of children: N/A  . Years of education: N/A    Social History Main Topics  . Smoking status: Current Every Day Smoker    Types: Cigarettes  . Smokeless tobacco: Never Used  . Alcohol use Yes     Comment: once a week  . Drug use: Unknown  . Sexual activity: Not Asked   Other Topics Concern  . None   Social History Narrative  . None   Additional Social History:    Pain Medications: None Prescriptions: None Over the Counter: None History of alcohol / drug use?: Yes Longest period of sobriety (when/how long): Not reported Negative Consequences of Use: Financial Withdrawal Symptoms: Other (Comment) (None reported) Name of Substance 1: THC 1 - Age of First Use: 50 1 - Amount (size/oz): "split one joint with a friend" 1 - Frequency: daily 1 - Duration: Ongoing 1 - Last Use / Amount: 8.21.17/ "split one joint with a friend"    Sleep: Good  Appetite:  Good  Current Medications: Current Facility-Administered Medications  Medication Dose Route Frequency Provider Last Rate Last Dose  . alum & mag hydroxide-simeth (MAALOX/MYLANTA) 200-200-20 MG/5ML suspension 30 mL  30 mL Oral Q4H PRN Patrecia Pour, NP      . citalopram (CELEXA) tablet 40 mg  40 mg Oral Daily Patrecia Pour, NP   40 mg at 04/16/16 0834  . feeding supplement (GLUCERNA SHAKE) (GLUCERNA SHAKE) liquid 237 mL  237 mL Oral TID BM Myer Peer Bosten Newstrom, MD   237 mL at 04/16/16 1049  . hydrOXYzine (  ATARAX/VISTARIL) tablet 25 mg  25 mg Oral Q6H PRN Laverle Hobby, PA-C   25 mg at 04/13/16 2302  . magnesium hydroxide (MILK OF MAGNESIA) suspension 30 mL  30 mL Oral Daily PRN Patrecia Pour, NP      . metFORMIN (GLUCOPHAGE) tablet 500 mg  500 mg Oral BID WC Patrecia Pour, NP   500 mg at 04/16/16 0834  . OLANZapine (ZYPREXA) tablet 2.5 mg  2.5 mg Oral QHS Norman Clay, MD   2.5 mg at 04/15/16 2143  . traZODone (DESYREL) tablet 50 mg  50 mg Oral QHS,MR X 1 Laverle Hobby, PA-C   50 mg at 04/15/16 2143    Lab Results:  Results for orders placed or performed during the  hospital encounter of 04/13/16 (from the past 48 hour(s))  Lipid panel     Status: Abnormal   Collection Time: 04/15/16  6:38 AM  Result Value Ref Range   Cholesterol 207 (H) 0 - 200 mg/dL   Triglycerides 174 (H) <150 mg/dL   HDL 34 (L) >40 mg/dL   Total CHOL/HDL Ratio 6.1 RATIO   VLDL 35 0 - 40 mg/dL   LDL Cholesterol 138 (H) 0 - 99 mg/dL    Comment:        Total Cholesterol/HDL:CHD Risk Coronary Heart Disease Risk Table                     Men   Women  1/2 Average Risk   3.4   3.3  Average Risk       5.0   4.4  2 X Average Risk   9.6   7.1  3 X Average Risk  23.4   11.0        Use the calculated Patient Ratio above and the CHD Risk Table to determine the patient's CHD Risk.        ATP III CLASSIFICATION (LDL):  <100     mg/dL   Optimal  100-129  mg/dL   Near or Above                    Optimal  130-159  mg/dL   Borderline  160-189  mg/dL   High  >190     mg/dL   Very High Performed at [facility]   Hemoglobin A1c     Status: Abnormal   Collection Time: 04/15/16  6:38 AM  Result Value Ref Range   Hgb A1c MFr Bld 6.5 (H) 4.8 - 5.6 %    Comment: (NOTE)         Pre-diabetes: 5.7 - 6.4         Diabetes: >6.4         Glycemic control for adults with diabetes: <7.0    Mean Plasma Glucose 140 mg/dL    Comment: (NOTE) Performed At: [facility] [place], [place] [id number] Lindon Romp MD [id number] Performed at [facility]   TSH     Status: None   Collection Time: 04/15/16  6:38 AM  Result Value Ref Range   TSH 2.301 0.350 - 4.500 uIU/mL    Comment: Performed at [facility]  Glucose, capillary     Status: Abnormal   Collection Time: 04/16/16  7:33 AM  Result Value Ref Range   Glucose-Capillary 254 (H) 65 - 99 mg/dL   Comment 1 Notify RN     Blood Alcohol level:  Lab Results  Component Value Date   ETH <5 [id number]    Metabolic Disorder Labs: Lab Results  Component Value Date    HGBA1C 6.5 (H) 04/15/2016   MPG 140 04/15/2016   No results found for: PROLACTIN Lab Results  Component Value Date   CHOL 207 (H) 04/15/2016   TRIG 174 (H) 04/15/2016   HDL 34 (L) 04/15/2016   CHOLHDL 6.1 04/15/2016   VLDL 35 04/15/2016   LDLCALC 138 (H) 04/15/2016   LDLCALC SEE COMMENT 02/09/2012    Physical Findings: AIMS: Facial and Oral Movements Muscles of Facial Expression: None, normal Lips and Perioral Area: None, normal Jaw: None, normal Tongue: None, normal,Extremity Movements Upper (arms, wrists, hands, fingers): None, normal Lower (legs, knees, ankles, toes): None, normal, Trunk Movements Neck, shoulders, hips: None, normal, Overall Severity Severity of abnormal movements (highest score from questions above): None, normal Incapacitation due to abnormal movements: None, normal Patient's awareness of abnormal movements (rate only patient's report): No Awareness, Dental Status Current problems with teeth and/or dentures?: No Does patient usually wear dentures?: No  CIWA:    COWS:     Musculoskeletal: Strength & Muscle Tone: within normal limits Gait & Station: normal Patient leans: N/A  Psychiatric Specialty Exam: Physical Exam  Constitutional: He is oriented to person, place, and time.  Neck: Normal range of motion.  Respiratory: Effort normal.  Neurological: He is alert and oriented to person, place, and time.  Skin: Skin is warm and dry.  Psychiatric: His speech is normal. His mood appears anxious. He expresses impulsivity. He exhibits a depressed mood.    ROS  Blood pressure (!) 147/75, pulse 83, temperature 97.7 F (36.5 C), resp. rate 16, height 5\' 10"  (1.778 m), weight 71.7 kg (158 lb), SpO2 100 %.Body mass index is 22.67 kg/m.  General Appearance: Casual  Eye Contact:  Good  Speech:  Clear and Coherent  Volume:  Normal  Mood:  Anxious and Depressed  Affect:  Restricted and down  Thought Process:  Coherent and Goal Directed  Orientation:  Full  (Time, Place, and Person)  Thought Content:  Logical Perceptions: denies AH/VH  Suicidal Thoughts:  No  Homicidal Thoughts:  No  Memory:  intact  Judgement:  Poor  Insight:  Shallow  Psychomotor Activity:  Normal  Concentration:  Concentration: Fair and Attention Span: Fair  Recall:  Good  Fund of Knowledge:  Good  Language:  Good  Akathisia:  No  Handed:  Right  AIMS (if indicated):     Assets:  Communication Skills Desire for Improvement  ADL's:  Intact  Cognition:  WNL  Sleep:  Number of Hours: 6.5    Treatment Plan Summary: Daily contact with patient to assess and evaluate symptoms and progress in treatment    Plans - Continue citalopram 40 mg daily - Continue olanzapine 2.5 mg qhs - Reviewed TSH 2.3, elevated lipid panels; needs monitoring - HbA1c pending, -  EKG 463 msec on 8/24 - Admit for crisis management and stabilization. - Medication management to reduce current symptoms to base line and improve the patient's overall level of functioning. - Monitor for the adverse effect of the medications and anger outbursts - Continue 15 minutes observation for safety concerns - Encouraged to participate in milieu therapy and group therapy counseling sessions and also work with coping skills -  Develop treatment plan to decrease risk of relapse upon discharge and to reduce the need for readmission. -  Psycho-social education regarding relapse prevention and self care. - Health care follow  up as needed for medical problems. - Restart home medications where appropriate.   Rankin, [place], NP 04/16/2016, 4:11 PM   Agree with NP Progress Note as above

## 2016-04-16 NOTE — Progress Notes (Signed)
The patient attended this evening's A. A. Meeting and was appropriate.  

## 2016-04-16 NOTE — [facility] Group Notes (Signed)
  [facility] Group Notes:  (Clinical Social Work)   06/20/2015     10:00-11:00AM  Summary of Progress/Problems:   In today's process group there was a discussion of healthy versus unhealthy coping techniques.  Each patient shared their story of what led to their hospitalization, identifying the unhealthy coping mechanisms that made the problem worse.  They were then asked to identify where they think they are in their recovery journal currently.  Finally, patients took turns doing role plays, being a person in their life that has somehow been injured by their unhealthy coping technique(s) and responding to group questions as that person.  This caused quite an emotional reaction in the group, with tearfulness being noted in a number of patients. The patient reported that he has been living in hell for the last month and in fact the last year since his mother died last 05/22/23.  He had been her caregiver, states that 4 months after she was diagnosed with cancer, his father died sitting next to him.  He feels he has a form of PTSD from all that has happened.  He is taking his siblings to court on fraud charges regarding the house, and in the meantime he himself was evicted on 8/1, is homeless, just lost his car, lost his girlfriend, and is feeling very hopeless and helpless.  He states he has been self-medicating with marijuana and at times with cocaine.  He was able to participate fully in group, but derived no hope from it.  At this time he feels like when he leaves the hospital he will be in the exact same spot.  Type of Therapy:  Group Therapy - Process   Participation Level:  Active  Participation Quality:  Appropriate, Attentive, Sharing and Supportive  Affect:  Blunted and Depressed   Cognitive:  Appropriate and Oriented  Insight:  Engaged  Engagement in Therapy:  Engaged  Modes of Intervention:  Education, Motivational Interviewing  Edgar White, [name] 04/16/2016, 12:24 PM

## 2016-04-17 LAB — GLUCOSE, CAPILLARY: GLUCOSE-CAPILLARY: 116 mg/dL — AB (ref 65–99)

## 2016-04-17 NOTE — Progress Notes (Signed)
Data. Patient denies SI/HI/AVH. Affect is flat and at times anxious. He has been expressing frustration at the loudness in the day room and this is making him irritable. He has spent much of the day on the phone attempting to get a ride home from [facility], when he is discharged and to find a place to live after D/C. On his self inventory he reports, 5/10 for hopelessness, 4/10 for depression and 3/10 for anxiety. His goal for today is; "Think about my plan for discharge." Action. Emotional support and encouragement offered. Education provided on medication, indications and side effect. Q 15 minute checks done for safety. Response. Safety on the unit maintained through 15 minute checks.  Medications taken as prescribed. Attended groups. Remained calm and appropriate through out shift.

## 2016-04-17 NOTE — Progress Notes (Signed)
Pt attended the [place] speaker meeting. Clint Bolder 8:57 PM 04/17/16

## 2016-04-17 NOTE — [facility] Group Notes (Signed)
[facility] Group Notes:  (Nursing/MHT/Case Management/Adjunct)  Date:  04/17/2016  Time:  10:24 AM  Type of Therapy:  Nurse Education  Participation Level:  Active  Participation Quality:  Appropriate  Affect:  Appropriate  Cognitive:  Appropriate  Insight:  Appropriate  Engagement in Group:  Engaged  Modes of Intervention:  Discussion, Education and Support  Summary of Progress/Problems: Chanan shared that he hoped to talk with the social worker about discharge plans.  Marya Landry 04/17/2016, 10:24 AM

## 2016-04-17 NOTE — Plan of Care (Signed)
Problem: Coping: Goal: Ability to cope will improve Outcome: Progressing Pt denies suicidal ideation

## 2016-04-17 NOTE — Progress Notes (Signed)
D.  Pt pleasant but anxious on approach, states it has been chaotic on the hall and he wishes to be discharged tomorrow.  Pt states "If I stay here much longer I'll go crazy".  Pt was positive for evening AA group, interacting appropriately with peers on the unit.  Pt denies SI/HI/hallucinations at this time.  A.  Support and encouragement offered, medication given as ordered  R.  Pt  Remains safe on the unit, will continue to monitor.

## 2016-04-17 NOTE — Progress Notes (Signed)
[facility] MD Progress Note  04/17/2016 1:54 PM Edgar White  MRN:  [id number]    Subjective:  "I got a lot of anxiety between all the drama on the unit and my own personal problems." Patient seen by this provider and chart reviewed 04/17/16.  On evaluation:  Edgar White reports that he has lost everything and no one will help.  Reports that he was behind on his car payments $6000.00 and his siblings won't help him get the money together to get his car back. Patient does report that his depression and anxiety has lessened since yesterday rating depression 2/10 and anxiety 5/10.  States that he is sleeping better and eating without difficulty.  Tolerating medication without adverse reaction; and attending participating group session.  He denies suicidal/homicidal ideation, psychosis, and paranoia.     Principal Problem: Major depressive disorder, recurrent severe without psychotic features ([place]) Diagnosis:   Patient Active Problem List   Diagnosis Date Noted  . Anxiety state [F41.1]   . Polysubstance abuse [F19.10]   . Major depressive disorder, recurrent severe without psychotic features ([place]) [F33.2] 04/13/2016   Total Time spent with patient: 20 minutes  Past Psychiatric History: MDD, recurrent. Benzodiazepine dependence, personality disorder  Past Medical History:  Past Medical History:  Diagnosis Date  . Depression   . Diabetes mellitus without complication [facility])     Past Surgical History:  Procedure Laterality Date  . INGUINAL HERNIA REPAIR    . LUMBAR FUSION     Family History: History reviewed. No pertinent family history. Family Psychiatric  History: Denies Social History:  History  Alcohol Use  . Yes    Comment: once a week     History  Drug use: Unknown    Social History   Social History  . Marital status: Single    Spouse name: N/A  . Number of children: N/A  . Years of education: N/A   Social History Main Topics  . Smoking status: Current Every Day Smoker     Types: Cigarettes  . Smokeless tobacco: Never Used  . Alcohol use Yes     Comment: once a week  . Drug use: Unknown  . Sexual activity: Not Asked   Other Topics Concern  . None   Social History Narrative  . None   Additional Social History:    Pain Medications: None Prescriptions: None Over the Counter: None History of alcohol / drug use?: Yes Longest period of sobriety (when/how long): Not reported Negative Consequences of Use: Financial Withdrawal Symptoms: Other (Comment) (None reported) Name of Substance 1: THC 1 - Age of First Use: 50 1 - Amount (size/oz): "split one joint with a friend" 1 - Frequency: daily 1 - Duration: Ongoing 1 - Last Use / Amount: 8.21.17/ "split one joint with a friend"    Sleep: Good  Appetite:  Good  Current Medications: Current Facility-Administered Medications  Medication Dose Route Frequency Provider Last Rate Last Dose  . alum & mag hydroxide-simeth (MAALOX/MYLANTA) 200-200-20 MG/5ML suspension 30 mL  30 mL Oral Q4H PRN Patrecia Pour, NP      . citalopram (CELEXA) tablet 40 mg  40 mg Oral Daily Patrecia Pour, NP   40 mg at 04/17/16 0802  . feeding supplement (GLUCERNA SHAKE) (GLUCERNA SHAKE) liquid 237 mL  237 mL Oral TID BM Myer Peer Cobos, MD   237 mL at 04/17/16 0940  . hydrOXYzine (ATARAX/VISTARIL) tablet 25 mg  25 mg Oral Q6H PRN Laverle Hobby, PA-C  25 mg at 04/16/16 2211  . magnesium hydroxide (MILK OF MAGNESIA) suspension 30 mL  30 mL Oral Daily PRN Patrecia Pour, NP      . metFORMIN (GLUCOPHAGE) tablet 500 mg  500 mg Oral BID WC Patrecia Pour, NP   500 mg at 04/17/16 0802  . OLANZapine (ZYPREXA) tablet 2.5 mg  2.5 mg Oral QHS Norman Clay, MD   2.5 mg at 04/16/16 2211  . traZODone (DESYREL) tablet 50 mg  50 mg Oral QHS,MR X 1 Laverle Hobby, PA-C   50 mg at 04/16/16 2211    Lab Results:  Results for orders placed or performed during the hospital encounter of 04/13/16 (from the past 48 hour(s))  Glucose, capillary      Status: Abnormal   Collection Time: 04/16/16  7:33 AM  Result Value Ref Range   Glucose-Capillary 254 (H) 65 - 99 mg/dL   Comment 1 Notify RN   Glucose, capillary     Status: Abnormal   Collection Time: 04/17/16  5:47 AM  Result Value Ref Range   Glucose-Capillary 116 (H) 65 - 99 mg/dL    Blood Alcohol level:  Lab Results  Component Value Date   ETH <5 [id number]    Metabolic Disorder Labs: Lab Results  Component Value Date   HGBA1C 6.5 (H) 04/15/2016   MPG 140 04/15/2016   No results found for: PROLACTIN Lab Results  Component Value Date   CHOL 207 (H) 04/15/2016   TRIG 174 (H) 04/15/2016   HDL 34 (L) 04/15/2016   CHOLHDL 6.1 04/15/2016   VLDL 35 04/15/2016   LDLCALC 138 (H) 04/15/2016   LDLCALC SEE COMMENT 02/09/2012    Physical Findings: AIMS: Facial and Oral Movements Muscles of Facial Expression: None, normal Lips and Perioral Area: None, normal Jaw: None, normal Tongue: None, normal,Extremity Movements Upper (arms, wrists, hands, fingers): None, normal Lower (legs, knees, ankles, toes): None, normal, Trunk Movements Neck, shoulders, hips: None, normal, Overall Severity Severity of abnormal movements (highest score from questions above): None, normal Incapacitation due to abnormal movements: None, normal Patient's awareness of abnormal movements (rate only patient's report): No Awareness, Dental Status Current problems with teeth and/or dentures?: No Does patient usually wear dentures?: No  CIWA:    COWS:     Musculoskeletal: Strength & Muscle Tone: within normal limits Gait & Station: normal Patient leans: N/A  Psychiatric Specialty Exam: Physical Exam  Nursing note and vitals reviewed. Constitutional: He is oriented to person, place, and time.  Neck: Normal range of motion.  Respiratory: Effort normal.  Neurological: He is alert and oriented to person, place, and time.  Skin: Skin is warm and dry.  Psychiatric: His speech is normal. His mood  appears anxious. He expresses impulsivity. He exhibits a depressed mood.    Review of Systems  Psychiatric/Behavioral: Positive for depression and substance abuse. The patient is nervous/anxious.   All other systems reviewed and are negative.   Blood pressure 122/80, pulse (!) 104, temperature 97.7 F (36.5 C), resp. rate 18, height 5\' 10"  (1.778 m), weight 71.7 kg (158 lb), SpO2 100 %.Body mass index is 22.67 kg/m.  General Appearance: Casual  Eye Contact:  Good  Speech:  Clear and Coherent  Volume:  Normal  Mood:  Anxious and Depressed  Affect:  Restricted and down  Thought Process:  Coherent and Goal Directed  Orientation:  Full (Time, Place, and Person)  Thought Content:  Logical Perceptions: denies AH/VH  Suicidal Thoughts:  No  Homicidal  Thoughts:  No  Memory:  intact  Judgement:  Poor  Insight:  Shallow  Psychomotor Activity:  Normal  Concentration:  Concentration: Fair and Attention Span: Fair  Recall:  Good  Fund of Knowledge:  Good  Language:  Good  Akathisia:  No  Handed:  Right  AIMS (if indicated):     Assets:  Communication Skills Desire for Improvement  ADL's:  Intact  Cognition:  WNL  Sleep:  Number of Hours: 6.25    Treatment Plan Summary: Daily contact with patient to assess and evaluate symptoms and progress in treatment    Plans - Continue citalopram 40 mg daily - Continue olanzapine 2.5 mg qhs - Reviewed TSH 2.3, elevated lipid panels; needs monitoring - HbA1c pending, -  EKG 463 msec on 8/24 - Admit for crisis management and stabilization. - Medication management to reduce current symptoms to base line and improve the patient's overall level of functioning. - Monitor for the adverse effect of the medications and anger outbursts - Continue 15 minutes observation for safety concerns - Encouraged to participate in milieu therapy and group therapy counseling sessions and also work with coping skills -  Develop treatment plan to decrease risk of  relapse upon discharge and to reduce the need for readmission. -  Psycho-social education regarding relapse prevention and self care. - Health care follow up as needed for medical problems. - Restart home medications where appropriate.  Continue with current treatment plan no changes at this time  Rankin, Delphia Grates, NP 04/17/2016, 1:54 PM   Agree with NP Progress Note as above

## 2016-04-17 NOTE — Plan of Care (Signed)
Problem: Activity: Goal: Interest or engagement in leisure activities will improve Outcome: Progressing Patient has been attending groups and interacting, appropriately, with peers in the day room.

## 2016-04-17 NOTE — [facility] Group Notes (Signed)
[facility] Group Notes:  (Clinical Social Work)  04/17/2016  10:00-11:00AM  Summary of Progress/Problems:   The main focus of today's process group was to   1)  discuss the importance of adding supports  2)  Identify the patient's current healthy supports and start thinking about what could be added             3)  Play a song that each patient feels inspired by, allowing time for explanations and reactions.  The patient expressed full comprehension of the concepts presented, and agreed that there is a need to add more supports.  The patient was very open to the idea of using music as a means of distraction from triggers. He reported that he himself is his only current support, but he hopes to add Alcoholics Anonymous and possible a rehabilitation stay.  Type of Therapy:  Process Group with Motivational Interviewing  Participation Level:  Active  Participation Quality:  Attentive, Sharing and Supportive  Affect:  Blunted and Depressed  Cognitive:  Alert, Appropriate and Oriented  Insight:  Engaged  Engagement in Therapy:  Engaged  Modes of Intervention:   Education, Support and Processing, Activity  Selmer Dominion, LCSW 04/17/2016

## 2016-04-18 DIAGNOSIS — F17210 Nicotine dependence, cigarettes, uncomplicated: Secondary | ICD-10-CM

## 2016-04-18 LAB — GLUCOSE, CAPILLARY: Glucose-Capillary: 133 mg/dL — ABNORMAL HIGH (ref 65–99)

## 2016-04-18 MED ORDER — TRAZODONE HCL 50 MG PO TABS: 50.0000 mg | ORAL_TABLET | Freq: Every evening | ORAL | 0 refills | Status: DC | PRN

## 2016-04-18 MED ORDER — METFORMIN HCL 500 MG PO TABS: 500.0000 mg | ORAL_TABLET | Freq: Two times a day (BID) | ORAL | Status: DC

## 2016-04-18 MED ORDER — OLANZAPINE 2.5 MG PO TABS: 2.5000 mg | ORAL_TABLET | Freq: Every day | ORAL | 0 refills | Status: DC

## 2016-04-18 MED ORDER — CITALOPRAM HYDROBROMIDE 40 MG PO TABS: 40.0000 mg | ORAL_TABLET | Freq: Every day | ORAL | 0 refills | Status: DC

## 2016-04-18 MED ORDER — HYDROXYZINE HCL 25 MG PO TABS: 25.0000 mg | ORAL_TABLET | Freq: Four times a day (QID) | ORAL | 0 refills | Status: DC | PRN

## 2016-04-18 NOTE — Progress Notes (Signed)
Patient has bright affect, euthymic.  Denied physical complaints today.  Attending groups, spending free time in day area.  Patient status reviewed with treatment team.  Received discharge orders.  Reviewed medications, discharge instructions, and follow up appointments with patient, patient verbalized understanding.  Patient denied SI, HI, and AVH.  Agrees to contact someone or 911 if she has thoughts/intent to harm self or others.    Medication scripts handed to patient.  Paperwork, AVS, SRA, and transition record handed to patient.   Escorted off of unit at 1240. Belongings returned per belongings form.  Discharged to lobby with bus pass.  To follow up per AVS.

## 2016-04-18 NOTE — Plan of Care (Signed)
Problem: Medication: Goal: Compliance with prescribed medication regimen will improve Outcome: Progressing Pt has been compliant with medication regimen   

## 2016-04-18 NOTE — Progress Notes (Signed)
Recreation Therapy Notes  Date: 04/18/16 Time: 0930 Location: [address] Dayroom  Group Topic: Stress Management  Goal Area(s) Addresses:  Patient will verbalize importance of using healthy stress management.  Patient will identify positive emotions associated with healthy stress management.   Behavioral Response: Engaged  Intervention: Stress Management  Activity :  [place].  LRT introduced to the technique of guided imagery to the patients.  Patients were to follow along as LRT read script in order to participate in the the technique.  Education: Stress Management, Discharge Planning.   Education Outcome: Acknowledges edcuation/In group clarification offered/Needs additional education  Clinical Observations/Feedback: Pt attended group.    Victorino Sparrow, LRT/CTRS    Victorino Sparrow A 04/18/2016 12:44 PM

## 2016-04-18 NOTE — Progress Notes (Addendum)
  [facility] Adult Case Management Discharge Plan :  Will you be returning to the same living situation after discharge:  Yes,  patient plans to return to previous living situation at discharge At discharge, do you have transportation home?: Yes,  patient reports access to transportation and was also provided with a bus pass  Do you have the ability to pay for your medications: Yes, patient will be provided with prescriptions at discharge.   Release of information consent forms completed and in the chart;  Patient's signature needed at discharge.  Patient to Follow up at: [facility] .   Why:  Please walk-in for hospital discharge appt within next 5 business days. Walk-in clinic open Monday-Friday at [facility] with Dr. Jacqualine Code on Tuesday Sept. 5th at 11:20am. Call office if you need to reschedule.  Contact information: [address] [place] [address] [phone]           Next level of care provider has access to [place] and Suicide Prevention discussed: Yes,  with patient and sister  Have you used any form of tobacco in the last 30 days? (Cigarettes, Smokeless Tobacco, Cigars, and/or Pipes): Yes  Has patient been referred to the Quitline?: Patient refused referral  Patient has been referred for addiction treatment: Yes  Prestin Munch L Rawad Bochicchio 04/18/2016, 10:23 AM
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
## 2016-04-18 NOTE — Tx Team (Signed)
Interdisciplinary Treatment and Diagnostic Plan Update  04/18/2016 Time of Session: 9:30am MALCOMB ROELFS MRN: [id number]  Principal Diagnosis: Major depressive disorder, recurrent severe without psychotic features [facility])  Secondary Diagnoses: Principal Problem:   Major depressive disorder, recurrent severe without psychotic features ([place]) Active Problems:   Anxiety state   Polysubstance abuse   Current Medications:  Current Facility-Administered Medications  Medication Dose Route Frequency Provider Last Rate Last Dose  . alum & mag hydroxide-simeth (MAALOX/MYLANTA) 200-200-20 MG/5ML suspension 30 mL  30 mL Oral Q4H PRN Patrecia Pour, NP      . citalopram (CELEXA) tablet 40 mg  40 mg Oral Daily Patrecia Pour, NP   40 mg at 04/18/16 [id number]  . feeding supplement (GLUCERNA SHAKE) (GLUCERNA SHAKE) liquid 237 mL  237 mL Oral TID BM Myer Peer Cobos, MD   237 mL at 04/18/16 1000  . hydrOXYzine (ATARAX/VISTARIL) tablet 25 mg  25 mg Oral Q6H PRN Laverle Hobby, PA-C   25 mg at 04/17/16 2206  . magnesium hydroxide (MILK OF MAGNESIA) suspension 30 mL  30 mL Oral Daily PRN Patrecia Pour, NP      . metFORMIN (GLUCOPHAGE) tablet 500 mg  500 mg Oral BID WC Patrecia Pour, NP   500 mg at 04/18/16 [id number]  . OLANZapine (ZYPREXA) tablet 2.5 mg  2.5 mg Oral QHS Norman Clay, MD   2.5 mg at 04/17/16 2206  . traZODone (DESYREL) tablet 50 mg  50 mg Oral QHS,MR X 1 Laverle Hobby, PA-C   50 mg at 04/17/16 2309   PTA Medications: Prescriptions Prior to Admission  Medication Sig Dispense Refill Last Dose  . citalopram (CELEXA) 40 MG tablet Take 40 mg by mouth daily.   Past Month at Unknown time  . desvenlafaxine (PRISTIQ) 50 MG 24 hr tablet Take 50 mg by mouth daily.   Past Month at Unknown time  . metFORMIN (GLUCOPHAGE) 500 MG tablet Take 500 mg by mouth 2 (two) times daily with a meal.   2 04/12/2016 at Unknown time    Treatment Modalities: Medication Management, Group therapy, Case management,  1 to 1  session with clinician, Psychoeducation, Recreational therapy.   Physician Treatment Plan for Primary Diagnosis: Major depressive disorder, recurrent severe without psychotic features ([place]) Long Term Goal(s): Improvement in symptoms so as ready for discharge   Short Term Goals: Ability to verbalize feelings will improve and Ability to disclose and discuss suicidal ideas  Medication Management: Evaluate patient's response, side effects, and tolerance of medication regimen.  Therapeutic Interventions: 1 to 1 sessions, Unit Group sessions and Medication administration.  Evaluation of Outcomes: Adequate for Discharge  Physician Treatment Plan for Secondary Diagnosis: Principal Problem:   Major depressive disorder, recurrent severe without psychotic features ([place]) Active Problems:   Anxiety state   Polysubstance abuse  Long Term Goal(s): Improvement in symptoms so as ready for discharge  Short Term Goals: Ability to identify changes in lifestyle to reduce recurrence of condition will improve, Ability to demonstrate self-control will improve and Ability to identify and develop effective coping behaviors will improve  Medication Management: Evaluate patient's response, side effects, and tolerance of medication regimen.  Therapeutic Interventions: 1 to 1 sessions, Unit Group sessions and Medication administration.  Evaluation of Outcomes: Adequate for Discharge   RN Treatment Plan for Primary Diagnosis: Major depressive disorder, recurrent severe without psychotic features ([place]) Long Term Goal(s): Knowledge of disease and therapeutic regimen to maintain health will improve  Short Term Goals: Ability to  verbalize frustration and anger appropriately will improve, Ability to verbalize feelings will improve and Ability to disclose and discuss suicidal ideas  Medication Management: RN will administer medications as ordered by provider, will assess and evaluate patient's response and provide  education to patient for prescribed medication. RN will report any adverse and/or side effects to prescribing provider.  Therapeutic Interventions: 1 on 1 counseling sessions, Psychoeducation, Medication administration, Evaluate responses to treatment, Monitor vital signs and CBGs as ordered, Perform/monitor CIWA, COWS, AIMS and Fall Risk screenings as ordered, Perform wound care treatments as ordered.  Evaluation of Outcomes: Adequate for Discharge   LCSW Treatment Plan for Primary Diagnosis: Major depressive disorder, recurrent severe without psychotic features ([place]) Long Term Goal(s): Safe transition to appropriate next level of care at discharge, Engage patient in therapeutic group addressing interpersonal concerns.  Short Term Goals: Engage patient in aftercare planning with referrals and resources, Increase social support and Increase emotional regulation  Therapeutic Interventions: Assess for all discharge needs, 1 to 1 time with Social worker, Explore available resources and support systems, Assess for adequacy in community support network, Educate family and significant other(s) on suicide prevention, Complete Psychosocial Assessment, Interpersonal group therapy.  Evaluation of Outcomes: Adequate for Discharge   Progress in Treatment :  Attending groups: Yes  Participating in groups: Yes  Taking medication as prescribed: Yes, MD continuing to assess for appropriate medication regimen  Toleration medication: Yes  Family/Significant other contact made: Yes, CSW has spoken with sister  Patient understands diagnosis: Yes  Discussing patient identified problems/goals with staff: Yes  Medical problems stabilized or resolved: Yes  Denies suicidal/homicidal ideation: Yes, denies  Issues/concerns per patient self-inventory: None reported  Other: N/A  New problem(s) identified: None reported at this time    New Short Term/Long Term Goal(s): None at this time    Discharge  Plan or Barriers: Patient plans to return to previous living situation to follow up with outpatient services.   Reason for Continuation of Hospitalization:  Anxiety  Depression  Hallucinations  Mania  Medication stabilization  Suicidal ideation  Withdrawal symptoms   Estimated Length of Stay: Discharge anticipated for today 04/18/16    Attendees:  Patient:                         Physician: Dr. Modesta Messing, MD  04/18/2016   9:30am  Nursing: Otilio Carpen, RN         04/18/2016 9:30am  RN Care Manager:   Social Workers:  Tilden Fossa, [place]  04/18/2016 9:30am  Nurse Pratictioners:   Scribe for Treatment Team: Tilden Fossa, [place] Worker [facility] [phone]

## 2016-04-18 NOTE — BHH Suicide Risk Assessment (Signed)
[facility] Discharge Suicide Risk Assessment   Principal Problem: Major depressive disorder, recurrent severe without psychotic features [facility]) Discharge Diagnoses:  Patient Active Problem List   Diagnosis Date Noted  . Anxiety state [F41.1]   . Polysubstance abuse [F19.10]   . Major depressive disorder, recurrent severe without psychotic features ([place]) [F33.2] 04/13/2016    Total Time spent with patient: 20 minutes  Musculoskeletal: Strength & Muscle Tone: within normal limits Gait & Station: normal Patient leans: N/A  Psychiatric Specialty Exam: Review of Systems  Psychiatric/Behavioral: Positive for depression. Negative for hallucinations, substance abuse and suicidal ideas. The patient is not nervous/anxious and does not have insomnia.   All other systems reviewed and are negative.   Blood pressure (!) 121/94, pulse (!) 101, temperature 97.5 F (36.4 C), temperature source Oral, resp. rate 18, height 5\' 10"  (1.778 m), weight 158 lb (71.7 kg), SpO2 100 %.Body mass index is 22.67 kg/m.  General Appearance: Casual  Eye Contact::  Good  Speech:  Clear and Coherent409  Volume:  Normal  Mood:  "better"  Affect:  Restricted  Thought Process:  Coherent and Goal Directed  Orientation:  Full (Time, Place, and Person)  Thought Content:  Logical Perceptions" denies AH/VH  Suicidal Thoughts:  No  Homicidal Thoughts:  No  Memory:  good  Judgement:  Fair  Insight:  Fair  Psychomotor Activity:  Normal  Concentration:  Good  Recall:  Good  Fund of Knowledge:Good  Language: Good  Akathisia:  No  Handed:  Right  AIMS (if indicated):     Assets:  Communication Skills Desire for Improvement  Sleep:  Number of Hours: 5.5  Cognition: WNL  ADL's:  Intact   Mental Status Per Nursing Assessment::   On Admission:  Suicidal ideation indicated by patient (on admission-"I feel safe in the hospital")  Demographic Factors:  Male, Caucasian, Abner Greenspan, lesbian, or bisexual orientation and Living  alone  Loss Factors: Loss of significant relationship, Legal issues and Financial problems/change in socioeconomic status  Historical Factors: Prior suicide attempts, Family history of suicide and Family history of mental illness or substance abuse OD 10 of Effexor in 2013  mother- depression,  maternal nephew/paternal uncle attempted suicide  Risk Reduction Factors:   Positive coping skills or problem solving skills  Continued Clinical Symptoms:  Depression:   Impulsivity  Cognitive Features That Contribute To Risk:  Closed-mindedness and Polarized thinking    Suicide Risk:  Mild:  Suicidal ideation of limited frequency, intensity, duration, and specificity.  There are no identifiable plans, no associated intent, mild dysphoria and related symptoms, good self-control (both objective and subjective assessment), few other risk factors, and identifiable protective factors, including available and accessible social support.  Patient is future oriented and denies SI. He agrees with follow up at [facility]. Please see SW note for details for his follow up appointment. He will be discharged to his friend's house in [place].   Plan Of Care/Follow-up recommendations:  Activity:  full Diet:  regular Tests:  none Other:  n/a  Norman Clay, MD 04/18/2016, 10:16 AM

## 2016-06-08 ENCOUNTER — Encounter: Payer: Self-pay | Admitting: *Deleted

## 2016-06-08 ENCOUNTER — Emergency Department
Admission: EM | Admit: 2016-06-08 | Discharge: 2016-06-08 | Disposition: A | Discharge status: Home / Self Care | Payer: BLUE CROSS/BLUE SHIELD | Attending: Emergency Medicine | Admitting: Emergency Medicine

## 2016-06-08 DIAGNOSIS — I10 Essential (primary) hypertension: Secondary | ICD-10-CM | POA: Insufficient documentation

## 2016-06-08 DIAGNOSIS — Z5181 Encounter for therapeutic drug level monitoring: Secondary | ICD-10-CM | POA: Insufficient documentation

## 2016-06-08 DIAGNOSIS — F32A Depression, unspecified: Secondary | ICD-10-CM

## 2016-06-08 DIAGNOSIS — F332 Major depressive disorder, recurrent severe without psychotic features: Secondary | ICD-10-CM | POA: Diagnosis not present

## 2016-06-08 DIAGNOSIS — F17210 Nicotine dependence, cigarettes, uncomplicated: Secondary | ICD-10-CM | POA: Insufficient documentation

## 2016-06-08 DIAGNOSIS — F411 Generalized anxiety disorder: Secondary | ICD-10-CM | POA: Diagnosis present

## 2016-06-08 DIAGNOSIS — F329 Major depressive disorder, single episode, unspecified: Secondary | ICD-10-CM

## 2016-06-08 DIAGNOSIS — F1910 Other psychoactive substance abuse, uncomplicated: Secondary | ICD-10-CM | POA: Diagnosis present

## 2016-06-08 DIAGNOSIS — E119 Type 2 diabetes mellitus without complications: Secondary | ICD-10-CM | POA: Diagnosis not present

## 2016-06-08 LAB — COMPREHENSIVE METABOLIC PANEL
ALBUMIN: 4.6 g/dL (ref 3.5–5.0)
ALK PHOS: 75 U/L (ref 38–126)
ALT: 21 U/L (ref 17–63)
ANION GAP: 10 (ref 5–15)
AST: 21 U/L (ref 15–41)
BUN: 11 mg/dL (ref 6–20)
CALCIUM: 9.6 mg/dL (ref 8.9–10.3)
CO2: 24 mmol/L (ref 22–32)
CREATININE: 0.97 mg/dL (ref 0.61–1.24)
Chloride: 104 mmol/L (ref 101–111)
GFR calc Af Amer: >60 mL/min (ref >60)
GFR calc non Af Amer: >60 mL/min (ref >60)
GLUCOSE: 129 mg/dL — AB (ref 65–99)
Potassium: 3.9 mmol/L (ref 3.5–5.1)
SODIUM: 138 mmol/L (ref 135–145)
Total Bilirubin: 0.9 mg/dL (ref 0.3–1.2)
Total Protein: 7.9 g/dL (ref 6.5–8.1)

## 2016-06-08 LAB — URINE DRUG SCREEN, QUALITATIVE (ARMC ONLY)
Amphetamines, Ur Screen: NOT DETECTED
Barbiturates, Ur Screen: NOT DETECTED
Benzodiazepine, Ur Scrn: POSITIVE — AB
CANNABINOID 50 NG, UR ~~LOC~~: POSITIVE — AB
Cocaine Metabolite,Ur ~~LOC~~: POSITIVE — AB
MDMA (ECSTASY) UR SCREEN: NOT DETECTED
Methadone Scn, Ur: NOT DETECTED
Opiate, Ur Screen: NOT DETECTED
PHENCYCLIDINE (PCP) UR S: NOT DETECTED
Tricyclic, Ur Screen: NOT DETECTED

## 2016-06-08 LAB — CBC
HCT: 44.8 % (ref 40.0–52.0)
Hemoglobin: 15.9 g/dL (ref 13.0–18.0)
MCH: 32.1 pg (ref 26.0–34.0)
MCHC: 35.6 g/dL (ref 32.0–36.0)
MCV: 90.4 fL (ref 80.0–100.0)
PLATELETS: 266 10*3/uL (ref 150–440)
RBC: 4.96 MIL/uL (ref 4.40–5.90)
RDW: 12.4 % (ref 11.5–14.5)
WBC: 9.7 10*3/uL (ref 3.8–10.6)

## 2016-06-08 LAB — ETHANOL: Alcohol, Ethyl (B): <5 mg/dL (ref <5)

## 2016-06-08 MED ORDER — METFORMIN HCL 500 MG PO TABS: 500.0000 mg | ORAL_TABLET | Freq: Two times a day (BID) | ORAL | 1 refills | Status: DC

## 2016-06-08 MED ORDER — METFORMIN HCL 500 MG PO TABS: 500.0000 mg | ORAL_TABLET | Freq: Two times a day (BID) | ORAL | 2 refills | Status: DC

## 2016-06-08 MED ORDER — TRAZODONE HCL 50 MG PO TABS: 50.0000 mg | ORAL_TABLET | Freq: Every evening | ORAL | 1 refills | Status: DC | PRN

## 2016-06-08 MED ORDER — HYDROCHLOROTHIAZIDE 12.5 MG PO TABS: 12.5000 mg | ORAL_TABLET | Freq: Every day | ORAL | 2 refills | Status: DC

## 2016-06-08 MED ORDER — CITALOPRAM HYDROBROMIDE 40 MG PO TABS: 40.0000 mg | ORAL_TABLET | Freq: Every day | ORAL | 1 refills | Status: DC

## 2016-06-08 MED ORDER — OLANZAPINE 2.5 MG PO TABS: 2.5000 mg | ORAL_TABLET | Freq: Every day | ORAL | 1 refills | Status: DC

## 2016-06-08 MED ORDER — HYDROXYZINE HCL 25 MG PO TABS: 25.0000 mg | ORAL_TABLET | Freq: Four times a day (QID) | ORAL | 1 refills | Status: DC | PRN

## 2016-06-08 NOTE — ED Notes (Signed)

## 2016-06-08 NOTE — Consult Note (Signed)
[facility] Consult   Reason for Consult:  Consult for 50 year old man with a history of depression who comes in with worsening symptoms Referring Physician:  Eual Fines Patient Identification: Edgar White MRN:  [id number] Principal Diagnosis: Major depressive disorder, recurrent severe without psychotic features [facility]) Diagnosis:   Patient Active Problem List   Diagnosis Date Noted  . Diabetes mellitus without complication ([place]) [N17.0] 06/08/2016  . Anxiety state [F41.1]   . Polysubstance abuse [F19.10]   . Major depressive disorder, recurrent severe without psychotic features ([place]) [F33.2] 04/13/2016    Total Time spent with patient: 1 hour  Subjective:   Edgar White is a 50 y.o. male patient admitted with "I'm just feeling really down".  HPI:  Patient interviewed. Chart reviewed. Labs reviewed. Patient known to me from previous encounters. 50 year old man with a history of depression reports for the past 3 weeks his mood has been back to feeling very down and depressed. He feels negative most of the time. Energy level is low. Hopelessness is high. Not having any hallucinations or psychotic symptoms. Denies having any suicidal or homicidal thoughts. He ran out of his medicine about 3 weeks ago because he had no resources to get them refilled. He is not currently working. Feels like he is under a great deal of stress. No clear place to stay. He is in the midst of a protracted legal battle with his siblings over his parents state. He admits that he occasionally uses marijuana and cocaine but minimizes it. Says the medicines were working well when he was taking them.  Social history: Currently no place to live. Plans to go to the shelter. Estranged from his family. Parents are deceased and he is in a legal conflict with his siblings.  Substance abuse history: History of abuse of multiple substances including alcohol and cocaine and marijuana  Medical history: Diabetes with  oral medication response  Past Psychiatric History: Patient has had at least 2 prior hospitalizations. This summer he was seriously suicidal and thought of jumping off a bridge but has not had any other suicide attempts. He is not currently having suicidal ideation. He was taking citalopram and a low dose of Zyprexa which were quite helpful.  Risk to Self:   Risk to Others:   Prior Inpatient Therapy:   Prior Outpatient Therapy:    Past Medical History:  Past Medical History:  Diagnosis Date  . Depression   . Diabetes mellitus without complication [facility])     Past Surgical History:  Procedure Laterality Date  . INGUINAL HERNIA REPAIR    . LUMBAR FUSION     Family History: History reviewed. No pertinent family history. Family Psychiatric  History: Positive for depression in his mother Social History:  History  Alcohol Use  . Yes    Comment: once a week     History  Drug use: Unknown    Social History   Social History  . Marital status: Single    Spouse name: N/A  . Number of children: N/A  . Years of education: N/A   Social History Main Topics  . Smoking status: Current Every Day Smoker    Types: Cigarettes  . Smokeless tobacco: Never Used  . Alcohol use Yes     Comment: once a week  . Drug use: Unknown  . Sexual activity: Not Asked   Other Topics Concern  . None   Social History Narrative  . None   Additional Social History:    Allergies:  No Known Allergies  Labs:  Results for orders placed or performed during the hospital encounter of 06/08/16 (from the past 48 hour(s))  Comprehensive metabolic panel     Status: Abnormal   Collection Time: 06/08/16  4:15 PM  Result Value Ref Range   Sodium 138 135 - 145 mmol/L   Potassium 3.9 3.5 - 5.1 mmol/L   Chloride 104 101 - 111 mmol/L   CO2 24 22 - 32 mmol/L   Glucose, Bld 129 (H) 65 - 99 mg/dL   BUN 11 6 - 20 mg/dL   Creatinine, Ser 0.97 0.61 - 1.24 mg/dL   Calcium 9.6 8.9 - 10.3 mg/dL   Total Protein 7.9  6.5 - 8.1 g/dL   Albumin 4.6 3.5 - 5.0 g/dL   AST 21 15 - 41 U/L   ALT 21 17 - 63 U/L   Alkaline Phosphatase 75 38 - 126 U/L   Total Bilirubin 0.9 0.3 - 1.2 mg/dL   GFR calc non Af Amer >60 >60 mL/min   GFR calc Af Amer >60 >60 mL/min    Comment: (NOTE) The eGFR has been calculated using the CKD EPI equation. This calculation has not been validated in all clinical situations. eGFR's persistently <60 mL/min signify possible Chronic Kidney Disease.    Anion gap 10 5 - 15  Ethanol     Status: None   Collection Time: 06/08/16  4:15 PM  Result Value Ref Range   Alcohol, Ethyl (B) <5 <5 mg/dL    Comment:        LOWEST DETECTABLE LIMIT FOR SERUM ALCOHOL IS 5 mg/dL FOR MEDICAL PURPOSES ONLY   cbc     Status: None   Collection Time: 06/08/16  4:15 PM  Result Value Ref Range   WBC 9.7 3.8 - 10.6 K/uL   RBC 4.96 4.40 - 5.90 MIL/uL   Hemoglobin 15.9 13.0 - 18.0 g/dL   HCT 44.8 40.0 - 52.0 %   MCV 90.4 80.0 - 100.0 fL   MCH 32.1 26.0 - 34.0 pg   MCHC 35.6 32.0 - 36.0 g/dL   RDW 12.4 11.5 - 14.5 %   Platelets 266 150 - 440 K/uL  Urine Drug Screen, Qualitative ([facility] only)     Status: Abnormal   Collection Time: 06/08/16  4:15 PM  Result Value Ref Range   Tricyclic, Ur Screen NONE DETECTED NONE DETECTED   Amphetamines, Ur Screen NONE DETECTED NONE DETECTED   MDMA (Ecstasy)Ur Screen NONE DETECTED NONE DETECTED   Cocaine Metabolite,Ur [place] POSITIVE (A) NONE DETECTED   Opiate, Ur Screen NONE DETECTED NONE DETECTED   Phencyclidine (PCP) Ur S NONE DETECTED NONE DETECTED   Cannabinoid 50 Ng, Ur [place] POSITIVE (A) NONE DETECTED   Barbiturates, Ur Screen NONE DETECTED NONE DETECTED   Benzodiazepine, Ur Scrn POSITIVE (A) NONE DETECTED   Methadone Scn, Ur NONE DETECTED NONE DETECTED    Comment: (NOTE) 161  Tricyclics, urine               Cutoff 1000 ng/mL 200  Amphetamines, urine             Cutoff 1000 ng/mL 300  MDMA (Ecstasy), urine           Cutoff 500 ng/mL 400  Cocaine Metabolite, urine        Cutoff 300 ng/mL 500  Opiate, urine                   Cutoff 300 ng/mL 600  Phencyclidine (PCP),  urine      Cutoff 25 ng/mL 700  Cannabinoid, urine              Cutoff 50 ng/mL 800  Barbiturates, urine             Cutoff 200 ng/mL 900  Benzodiazepine, urine           Cutoff 200 ng/mL 1000 Methadone, urine                Cutoff 300 ng/mL 1100 1200 The urine drug screen provides only a preliminary, unconfirmed 1300 analytical test result and should not be used for non-medical 1400 purposes. Clinical consideration and professional judgment should 1500 be applied to any positive drug screen result due to possible 1600 interfering substances. A more specific alternate chemical method 1700 must be used in order to obtain a confirmed analytical result.  1800 Gas chromato graphy / mass spectrometry (GC/MS) is the preferred 1900 confirmatory method.     No current facility-administered medications for this encounter.    Current Outpatient Prescriptions  Medication Sig Dispense Refill  . citalopram (CELEXA) 40 MG tablet Take 1 tablet (40 mg total) by mouth daily. For depression 30 tablet 1  . hydrOXYzine (ATARAX/VISTARIL) 25 MG tablet Take 1 tablet (25 mg total) by mouth every 6 (six) hours as needed for anxiety. 60 tablet 1  . metFORMIN (GLUCOPHAGE) 500 MG tablet Take 1 tablet (500 mg total) by mouth 2 (two) times daily with a meal. For diabetes management 60 tablet 1  . OLANZapine (ZYPREXA) 2.5 MG tablet Take 1 tablet (2.5 mg total) by mouth at bedtime. For mood control/agitation 30 tablet 1  . traZODone (DESYREL) 50 MG tablet Take 1 tablet (50 mg total) by mouth at bedtime and may repeat dose one time if needed. For sleep 30 tablet 1    Musculoskeletal: Strength & Muscle Tone: within normal limits Gait & Station: normal Patient leans: N/A  Psychiatric Specialty Exam: Physical Exam  Nursing note and vitals reviewed. Constitutional: He appears well-developed and well-nourished.   HENT:  Head: Normocephalic and atraumatic.  Eyes: Conjunctivae are normal. Pupils are equal, round, and reactive to light.  Neck: Normal range of motion.  Cardiovascular: Regular rhythm and normal heart sounds.   Respiratory: Effort normal. No respiratory distress.  GI: Soft.  Musculoskeletal: Normal range of motion.  Neurological: He is alert.  Skin: Skin is warm and dry.  Psychiatric: Judgment normal. His mood appears anxious. His speech is delayed. He is slowed. Cognition and memory are normal. He exhibits a depressed mood. He expresses no homicidal and no suicidal ideation.    Review of Systems  Constitutional: Negative.   HENT: Negative.   Eyes: Negative.   Respiratory: Negative.   Cardiovascular: Negative.   Gastrointestinal: Negative.   Musculoskeletal: Negative.   Skin: Negative.   Neurological: Negative.   Psychiatric/Behavioral: Positive for depression and substance abuse. Negative for hallucinations, memory loss and suicidal ideas. The patient is not nervous/anxious and does not have insomnia.     Blood pressure (!) 182/121, pulse (!) 103, temperature 98 F (36.7 C), temperature source Oral, resp. rate 18, height 5' 10" (1.778 m), weight 76.2 kg (168 lb), SpO2 100 %.Body mass index is 24.11 kg/m.  General Appearance: Casual  Eye Contact:  Good  Speech:  Clear and Coherent  Volume:  Normal  Mood:  Depressed  Affect:  Congruent  Thought Process:  Goal Directed  Orientation:  Full (Time, Place, and Person)  Thought Content:  Logical  Suicidal Thoughts:  No  Homicidal Thoughts:  No  Memory:  Immediate;   Good Recent;   Fair Remote;   Fair  Judgement:  Fair  Insight:  Fair  Psychomotor Activity:  Decreased  Concentration:  Concentration: Fair  Recall:  [employer] of Knowledge:  Fair  Language:  Fair  Akathisia:  No  Handed:  Right  AIMS (if indicated):     Assets:  Communication Skills Desire for Improvement Resilience  ADL's:  Intact  Cognition:  WNL   Sleep:        Treatment Plan Summary: Medication management and Plan Refill of citalopram and Zyprexa as well as trazodone and Vistaril as needed. Supportive counseling. Reviewed nature of depression and appropriate treatment plan. He will be following up with [facility]. Strongly encouraged him to discontinue drug abuse. Case reviewed with emergency room doctor. Patient does not require inpatient treatment and will follow-up locally.  Disposition: Patient does not meet criteria for psychiatric inpatient admission. Supportive therapy provided about ongoing stressors.  Alethia Berthold, MD 06/08/2016 5:35 PM

## 2016-06-08 NOTE — ED Triage Notes (Addendum)
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

## 2016-06-08 NOTE — Discharge Instructions (Signed)
You have been seen in the Emergency Department (ED)  today for a psychiatric complaint.  You have been evaluated by psychiatry and we believe you are safe to be discharged from the hospital.   ° °Please return to the Emergency Department (ED)  immediately if you have ANY thoughts of hurting yourself or anyone else, so that we may help you. ° °Please avoid alcohol and drug use. ° °Follow up with your doctor and/or therapist as soon as possible regarding today's ED  visit.  ° °You may call crisis hotline for [place] at [phone]. ° °

## 2016-06-08 NOTE — Progress Notes (Signed)
Per request received call from ED Dr. Hanley Seamen pt. Homeless shelter resources. Edgar White, LPC-A, [facility]  Counselor 06/08/2016 5:19 PM

## 2016-06-08 NOTE — ED Notes (Signed)
BEHAVIORAL HEALTH ROUNDING Patient sleeping: No. Patient alert and oriented: yes Behavior appropriate: Yes.  ; If no, describe:  Nutrition and fluids offered: yes Toileting and hygiene offered: Yes  Sitter present: q15 minute observations and security  monitoring Law enforcement present: Yes  ODS  

## 2016-06-08 NOTE — ED Provider Notes (Signed)
[facility] Emergency Department Provider Note  ____________________________________________  Time seen: Approximately 4:58 PM  I have reviewed the triage vital signs and the nursing notes.   HISTORY  Chief Complaint No chief complaint on file.   HPI Edgar White is a 50 y.o. male with a history of hypertension, depression, and diabetes who presents for evaluation of depression. Patient reports that 3 weeks ago he ran out of his Celexa and olanzapine. He has an appointment with his psychiatrist at [facility] in the end of November. He reports that there is a lot of stress going on in his life. He is homeless, he lost his car and is currently undergoing a lawsuit against his brothers for his mother inheritance. His mother passed away a year ago. Patient reports that he is feeling very depressed but denies suicidal ideation or homicidal ideation. Patient is here asking for help and requesting if we could help get him his prescriptions. Patient does not have a shelter to spend the night. Patient denies alcohol use. He does endorse smoking marijuana frequently and reports using cocaine a few days ago.  Past Medical History:  Diagnosis Date  . Depression   . Diabetes mellitus without complication [facility])     Patient Active Problem List   Diagnosis Date Noted  . Diabetes mellitus without complication ([place]) 123456  . Anxiety state   . Polysubstance abuse   . Major depressive disorder, recurrent severe without psychotic features ([place]) 04/13/2016    Past Surgical History:  Procedure Laterality Date  . INGUINAL HERNIA REPAIR    . LUMBAR FUSION      Prior to Admission medications   Medication Sig Start Date End Date Taking? Authorizing Provider  citalopram (CELEXA) 40 MG tablet Take 1 tablet (40 mg total) by mouth daily. For depression 06/08/16   Gonzella Lex, MD  hydrochlorothiazide (HYDRODIURIL) 12.5 MG tablet Take 1 tablet (12.5 mg total) by mouth daily.  06/08/16   Rudene Re, MD  hydrOXYzine (ATARAX/VISTARIL) 25 MG tablet Take 1 tablet (25 mg total) by mouth every 6 (six) hours as needed for anxiety. 06/08/16   Gonzella Lex, MD  metFORMIN (GLUCOPHAGE) 500 MG tablet Take 1 tablet (500 mg total) by mouth 2 (two) times daily with a meal. For diabetes management 06/08/16   Gonzella Lex, MD  metFORMIN (GLUCOPHAGE) 500 MG tablet Take 1 tablet (500 mg total) by mouth 2 (two) times daily with a meal. 06/08/16 06/08/17  Rudene Re, MD  OLANZapine (ZYPREXA) 2.5 MG tablet Take 1 tablet (2.5 mg total) by mouth at bedtime. For mood control/agitation 06/08/16   Gonzella Lex, MD  traZODone (DESYREL) 50 MG tablet Take 1 tablet (50 mg total) by mouth at bedtime and may repeat dose one time if needed. For sleep 06/08/16   Gonzella Lex, MD    Allergies Review of patient's allergies indicates no known allergies.  History reviewed. No pertinent family history.  Social History Social History  Substance Use Topics  . Smoking status: Current Every Day Smoker    Types: Cigarettes  . Smokeless tobacco: Never Used  . Alcohol use Yes     Comment: once a week    Review of Systems  Constitutional: Negative for fever. Eyes: Negative for visual changes. ENT: Negative for sore throat. Cardiovascular: Negative for chest pain. Respiratory: Negative for shortness of breath. Gastrointestinal: Negative for abdominal pain, vomiting or diarrhea. Genitourinary: Negative for dysuria. Musculoskeletal: Negative for back pain. Skin: Negative for rash.  Neurological: Negative for headaches, weakness or numbness. Psych: + depression. No Si or HI  ____________________________________________   PHYSICAL EXAM:  VITAL SIGNS: ED Triage Vitals [06/08/16 1612]  Enc Vitals Group     BP (!) 182/121     Pulse Rate (!) 103     Resp 18     Temp 98 F (36.7 C)     Temp Source Oral     SpO2 100 %     Weight 168 lb (76.2 kg)     Height 5\' 10"  (1.778 m)       Head Circumference      Peak Flow      Pain Score      Pain Loc      Pain Edu?      Excl. in [place]?     Constitutional: Alert and oriented. Well appearing and in no apparent distress. HEENT:      Head: Normocephalic and atraumatic.         Eyes: Conjunctivae are normal. Sclera is non-icteric. EOMI. PERRL      Mouth/Throat: Mucous membranes are moist.       Neck: Supple with no signs of meningismus. Cardiovascular: Regular rate and rhythm. No murmurs, gallops, or rubs. 2+ symmetrical distal pulses are present in all extremities. No JVD. Respiratory: Normal respiratory effort. Lungs are clear to auscultation bilaterally. No wheezes, crackles, or rhonchi.  Gastrointestinal: Soft, non tender, and non distended with positive bowel sounds. No rebound or guarding. Genitourinary: No CVA tenderness. Musculoskeletal: Nontender with normal range of motion in all extremities. No edema, cyanosis, or erythema of extremities. Neurologic: Normal speech and language. Face is symmetric. Moving all extremities. No gross focal neurologic deficits are appreciated. Skin: Skin is warm, dry and intact. No rash noted. Psychiatric: Mood and affect are depressed. Speech and behavior are normal. No SI or HI  ____________________________________________   LABS (all labs ordered are listed, but only abnormal results are displayed)  Labs Reviewed  COMPREHENSIVE METABOLIC PANEL - Abnormal; Notable for the following:       Result Value   Glucose, Bld 129 (*)    All other components within normal limits  URINE DRUG SCREEN, QUALITATIVE ([facility] ONLY) - Abnormal; Notable for the following:    Cocaine Metabolite,Ur [place] POSITIVE (*)    Cannabinoid 50 Ng, Ur [place] POSITIVE (*)    Benzodiazepine, Ur Scrn POSITIVE (*)    All other components within normal limits  ETHANOL  CBC   ____________________________________________  EKG  none ____________________________________________  RADIOLOGY  none   ____________________________________________   PROCEDURES  Procedure(s) performed: None Procedures Critical Care performed:  None ____________________________________________   INITIAL IMPRESSION / ASSESSMENT AND PLAN / ED COURSE  50 y.o. male with a history of hypertension, depression, and diabetes who presents for evaluation of depression. Patient with no suicidal or homicidal ideation. Patient is here voluntarily requesting refill for his antidepressants. Patient is going through a lot of stress at this time. I do not have concerns at this time for patient's safety. No indication for IVC or inpatient psychiatric admission. Patient was seen by Dr. Weber Cooks who is familiar with the patient and he agrees with my assessment. Dr. Weber Cooks will provide patient with prescriptions for his anti-depressant and I will provide patient with prescriptions for metformin and hydrochlorothiazide. Patient was provided with a list of homeless shelters and is planning on going to one this evening. I recommended patient returning to the emergency room if his depressive symptoms get worse or if  he develops any psychosis or suicidal ideation/homicidal ideation. Patient is in agreement.  Clinical Course    Pertinent labs & imaging results that were available during my care of the patient were reviewed by me and considered in my medical decision making (see chart for details).    ____________________________________________   FINAL CLINICAL IMPRESSION(S) / ED DIAGNOSES  Final diagnoses:  Depression, unspecified depression type      NEW MEDICATIONS STARTED DURING THIS VISIT:  New Prescriptions   HYDROCHLOROTHIAZIDE (HYDRODIURIL) 12.5 MG TABLET    Take 1 tablet (12.5 mg total) by mouth daily.   METFORMIN (GLUCOPHAGE) 500 MG TABLET    Take 1 tablet (500 mg total) by mouth 2 (two) times daily with a meal.     Note:  This document was prepared using Dragon voice recognition software and may include  unintentional dictation errors.    Rudene Re, MD 06/08/16 [phone]

## 2016-10-12 ENCOUNTER — Encounter (HOSPITAL_COMMUNITY): Payer: Self-pay | Admitting: Family Medicine

## 2016-10-12 ENCOUNTER — Emergency Department (HOSPITAL_COMMUNITY)
Admission: EM | Admit: 2016-10-12 | Discharge: 2016-10-13 | Disposition: A | Discharge status: Other Acute Inpatient Hospital | Payer: BLUE CROSS/BLUE SHIELD | Attending: Emergency Medicine | Admitting: Emergency Medicine

## 2016-10-12 DIAGNOSIS — F17210 Nicotine dependence, cigarettes, uncomplicated: Secondary | ICD-10-CM | POA: Insufficient documentation

## 2016-10-12 DIAGNOSIS — E119 Type 2 diabetes mellitus without complications: Secondary | ICD-10-CM | POA: Insufficient documentation

## 2016-10-12 DIAGNOSIS — Z7984 Long term (current) use of oral hypoglycemic drugs: Secondary | ICD-10-CM | POA: Insufficient documentation

## 2016-10-12 DIAGNOSIS — Z79899 Other long term (current) drug therapy: Secondary | ICD-10-CM | POA: Insufficient documentation

## 2016-10-12 DIAGNOSIS — F1910 Other psychoactive substance abuse, uncomplicated: Secondary | ICD-10-CM

## 2016-10-12 DIAGNOSIS — F332 Major depressive disorder, recurrent severe without psychotic features: Secondary | ICD-10-CM | POA: Insufficient documentation

## 2016-10-12 DIAGNOSIS — R739 Hyperglycemia, unspecified: Secondary | ICD-10-CM

## 2016-10-12 DIAGNOSIS — R45851 Suicidal ideations: Secondary | ICD-10-CM

## 2016-10-12 LAB — COMPREHENSIVE METABOLIC PANEL
ALT: 31 U/L (ref 17–63)
AST: 33 U/L (ref 15–41)
Albumin: 4.4 g/dL (ref 3.5–5.0)
Alkaline Phosphatase: 70 U/L (ref 38–126)
Anion gap: 10 (ref 5–15)
BILIRUBIN TOTAL: 0.3 mg/dL (ref 0.3–1.2)
BUN: 15 mg/dL (ref 6–20)
CALCIUM: 9.8 mg/dL (ref 8.9–10.3)
CHLORIDE: 102 mmol/L (ref 101–111)
CO2: 22 mmol/L (ref 22–32)
CREATININE: 1.1 mg/dL (ref 0.61–1.24)
Glucose, Bld: 302 mg/dL — ABNORMAL HIGH (ref 65–99)
Potassium: 4.2 mmol/L (ref 3.5–5.1)
Sodium: 134 mmol/L — ABNORMAL LOW (ref 135–145)
TOTAL PROTEIN: 7.6 g/dL (ref 6.5–8.1)

## 2016-10-12 LAB — RAPID URINE DRUG SCREEN, HOSP PERFORMED
Amphetamines: NOT DETECTED
BARBITURATES: NOT DETECTED
Benzodiazepines: POSITIVE — AB
COCAINE: POSITIVE — AB
OPIATES: NOT DETECTED
Tetrahydrocannabinol: POSITIVE — AB

## 2016-10-12 LAB — CBC
HCT: 43.2 % (ref 39.0–52.0)
Hemoglobin: 15.5 g/dL (ref 13.0–17.0)
MCH: 32 pg (ref 26.0–34.0)
MCHC: 35.9 g/dL (ref 30.0–36.0)
MCV: 89.1 fL (ref 78.0–100.0)
PLATELETS: 243 10*3/uL (ref 150–400)
RBC: 4.85 MIL/uL (ref 4.22–5.81)
RDW: 12.2 % (ref 11.5–15.5)
WBC: 9.6 10*3/uL (ref 4.0–10.5)

## 2016-10-12 LAB — SALICYLATE LEVEL

## 2016-10-12 LAB — CBG MONITORING, ED: Glucose-Capillary: 231 mg/dL — ABNORMAL HIGH (ref 65–99)

## 2016-10-12 LAB — ACETAMINOPHEN LEVEL: Acetaminophen (Tylenol), Serum: <10 ug/mL — ABNORMAL LOW (ref 10–30)

## 2016-10-12 LAB — ETHANOL

## 2016-10-12 MED ORDER — ZOLPIDEM TARTRATE 5 MG PO TABS: 5.0000 mg | ORAL_TABLET | Freq: Every evening | ORAL | Status: DC | PRN

## 2016-10-12 MED ORDER — ALUM & MAG HYDROXIDE-SIMETH 200-200-20 MG/5ML PO SUSP: 30.0000 mL | ORAL | Status: DC | PRN

## 2016-10-12 MED ORDER — LORAZEPAM 1 MG PO TABS: 1.0000 mg | ORAL_TABLET | Freq: Three times a day (TID) | ORAL | Status: DC | PRN

## 2016-10-12 MED ORDER — IBUPROFEN 200 MG PO TABS: 600.0000 mg | ORAL_TABLET | Freq: Three times a day (TID) | ORAL | Status: DC | PRN

## 2016-10-12 MED ORDER — METFORMIN HCL 500 MG PO TABS: 500.0000 mg | ORAL_TABLET | Freq: Once | ORAL | Status: DC

## 2016-10-12 MED ORDER — HYDROCHLOROTHIAZIDE 25 MG PO TABS
12.5000 mg | ORAL_TABLET | Freq: Every day | ORAL | Status: DC
  Filled 2016-10-12: qty 0.5

## 2016-10-12 MED ORDER — NICOTINE 21 MG/24HR TD PT24
21.0000 mg | MEDICATED_PATCH | Freq: Every day | TRANSDERMAL | Status: DC
  Administered 2016-10-12: 21 mg via TRANSDERMAL
  Filled 2016-10-12: qty 1

## 2016-10-12 MED ORDER — HYDROCHLOROTHIAZIDE 12.5 MG PO CAPS
12.5000 mg | ORAL_CAPSULE | Freq: Every day | ORAL | Status: DC
  Administered 2016-10-12: 12.5 mg via ORAL
  Filled 2016-10-12: qty 1

## 2016-10-12 MED ORDER — METFORMIN HCL 500 MG PO TABS
500.0000 mg | ORAL_TABLET | Freq: Two times a day (BID) | ORAL | Status: DC
  Administered 2016-10-12: 500 mg via ORAL
  Filled 2016-10-12: qty 1

## 2016-10-12 MED ORDER — CITALOPRAM HYDROBROMIDE 10 MG PO TABS: 40.0000 mg | ORAL_TABLET | Freq: Every day | ORAL | Status: DC

## 2016-10-12 MED ORDER — ACETAMINOPHEN 325 MG PO TABS: 650.0000 mg | ORAL_TABLET | ORAL | Status: DC | PRN

## 2016-10-12 MED ORDER — ONDANSETRON HCL 4 MG PO TABS
4.0000 mg | ORAL_TABLET | Freq: Three times a day (TID) | ORAL | Status: DC | PRN
Start: 2016-10-12 — End: 2016-10-13

## 2016-10-12 NOTE — ED Notes (Signed)
Pt ambulated back to [facility] with NT. Belongings given to [employer] staff.

## 2016-10-12 NOTE — ED Triage Notes (Signed)
Patient reports he was at [facility] in August 2017, since discharge he continues to have SI with thinking of best ways to die, like drowning, "but I don't have a plan". Constant thoughts of SI and sadness. Recently, patient has moved in with his brother since pt was homeless. Pt reports last Tuesday night he had an argument with brother on the telephone.He reports he has just dropped a lawsuit with his siblings for fraud from his mothers death. Also, mother died last year of cancer.

## 2016-10-12 NOTE — ED Provider Notes (Signed)
[facility] DEPT Provider Note   CSN: [id number] Arrival date & time: 10/12/16  1519     History   Chief Complaint Chief Complaint  Patient presents with  . Psychiatric Evaluation    HPI Edgar White is a 51 y.o. male.  HPI Edgar White is a 51 y.o. male with history of diabetes, depression, presents to emergency department complaining of suicidal ideations and worsening depression. Patient states that he has been more depressed since last year when his mother passed away. He states in April 28, 2023 of this past year he lost his job and his apartment and became homeless. He was seen in ED at that time, and treated for depression and suicidal ideations. He states he has been seeing a psychiatrist in [place], however has not seen a therapist since then. He states last week, he had identified with his brother where he was staying, and his symptoms worsened again. He reports that he is anxious daily, depressed, does not want to live. No suicidal plan. No homicidal ideations. Taking his Celexa, no other medications. Reports history of diabetes, has been off of his metformin for almost a year. Unsure what his blood sugar is. Reports urinary frequency and thirst. Admits to daily marijuana use, occasional cocaine, no other drugs or alcohol  Past Medical History:  Diagnosis Date  . Depression   . Diabetes mellitus without complication [facility])     Patient Active Problem List   Diagnosis Date Noted  . Diabetes mellitus without complication ([place]) 123456  . Anxiety state   . Polysubstance abuse   . Major depressive disorder, recurrent severe without psychotic features ([place]) 04/13/2016    Past Surgical History:  Procedure Laterality Date  . INGUINAL HERNIA REPAIR    . LUMBAR FUSION         Home Medications    Prior to Admission medications   Medication Sig Start Date End Date Taking? Authorizing Provider  citalopram (CELEXA) 40 MG tablet Take 1 tablet (40 mg total) by mouth daily.  For depression 06/08/16   Gonzella Lex, MD  hydrochlorothiazide (HYDRODIURIL) 12.5 MG tablet Take 1 tablet (12.5 mg total) by mouth daily. 06/08/16   Rudene Re, MD  hydrOXYzine (ATARAX/VISTARIL) 25 MG tablet Take 1 tablet (25 mg total) by mouth every 6 (six) hours as needed for anxiety. 06/08/16   Gonzella Lex, MD  metFORMIN (GLUCOPHAGE) 500 MG tablet Take 1 tablet (500 mg total) by mouth 2 (two) times daily with a meal. For diabetes management 06/08/16   Gonzella Lex, MD  metFORMIN (GLUCOPHAGE) 500 MG tablet Take 1 tablet (500 mg total) by mouth 2 (two) times daily with a meal. 06/08/16 06/08/17  Rudene Re, MD  OLANZapine (ZYPREXA) 2.5 MG tablet Take 1 tablet (2.5 mg total) by mouth at bedtime. For mood control/agitation 06/08/16   Gonzella Lex, MD  traZODone (DESYREL) 50 MG tablet Take 1 tablet (50 mg total) by mouth at bedtime and may repeat dose one time if needed. For sleep 06/08/16   Gonzella Lex, MD    Family History History reviewed. No pertinent family history.  Social History Social History  Substance Use Topics  . Smoking status: Current Every Day Smoker    Packs/day: 1.00    Types: Cigarettes  . Smokeless tobacco: Never Used  . Alcohol use Yes     Comment: once a week     Allergies   Patient has no known allergies.   Review of Systems Review of Systems  Constitutional: Negative for chills and fever.  Respiratory: Negative for cough, chest tightness and shortness of breath.   Cardiovascular: Negative for chest pain, palpitations and leg swelling.  Gastrointestinal: Negative for abdominal distention, abdominal pain, diarrhea, nausea and vomiting.  Genitourinary: Negative for dysuria, frequency, hematuria and urgency.  Musculoskeletal: Negative for arthralgias, myalgias, neck pain and neck stiffness.  Skin: Negative for rash.  Allergic/Immunologic: Negative for immunocompromised state.  Neurological: Negative for dizziness, weakness,  light-headedness, numbness and headaches.  Psychiatric/Behavioral: Positive for dysphoric mood and suicidal ideas. The patient is nervous/anxious.   All other systems reviewed and are negative.    Physical Exam Updated Vital Signs BP 127/70 (BP Location: Right Arm)   Pulse 93   Temp 98.7 F (37.1 C) (Oral)   Resp 20   Ht 5\' 10"  (1.778 m)   Wt 81.2 kg   SpO2 98%   BMI 25.68 kg/m   Physical Exam  Constitutional: He appears well-developed and well-nourished. No distress.  HENT:  Head: Normocephalic and atraumatic.  Eyes: Conjunctivae are normal.  Neck: Neck supple.  Cardiovascular: Normal rate, regular rhythm and normal heart sounds.   Pulmonary/Chest: Effort normal. No respiratory distress. He has no wheezes. He has no rales.  Abdominal: Soft. Bowel sounds are normal. He exhibits no distension. There is no tenderness. There is no rebound.  Musculoskeletal: He exhibits no edema.  Neurological: He is alert.  Skin: Skin is warm and dry.  Psychiatric: He has a normal mood and affect. His behavior is normal.  Nursing note and vitals reviewed.    ED Treatments / Results  Labs (all labs ordered are listed, but only abnormal results are displayed) Labs Reviewed  COMPREHENSIVE METABOLIC PANEL  ETHANOL  SALICYLATE LEVEL  ACETAMINOPHEN LEVEL  CBC  RAPID URINE DRUG SCREEN, HOSP PERFORMED    EKG  EKG Interpretation None       Radiology No results found.  Procedures Procedures (including critical care time)  Medications Ordered in ED Medications - No data to display   Initial Impression / Assessment and Plan / ED Course  I have reviewed the triage vital signs and the nursing notes.  Pertinent labs & imaging results that were available during my care of the patient were reviewed by me and considered in my medical decision making (see chart for details).     Patient in emergency department with worsening depression and suicidal ideations. Patient is here  voluntarily. We'll check labs, drug screen, monitor.   8:19 PM Patient is medically cleared. His CBG is 300. He has been off of his metformin for multiple months. We will restart that in emergency department. He will need a prescription upon discharge.  9:52 PM Patient accepted to behavioral health. Dr. Parke Poisson accepting.   Final Clinical Impressions(s) / ED Diagnoses   Final diagnoses:  Suicidal ideation  Polysubstance abuse  Hyperglycemia    New Prescriptions New Prescriptions   No medications on file     Jeannett Senior, PA-C 10/12/16 2153    Sherwood Gambler, MD 10/14/16 [phone]

## 2016-10-12 NOTE — ED Notes (Signed)
Pt ambulatory w/o difficulty fromn TCU, pt wanded prior to arrival.

## 2016-10-12 NOTE — ED Notes (Signed)
Patient is eating dinner and will go to [facility] once he has ate his dinner. Informed Diane, RN of the delay in patient transporting.

## 2016-10-12 NOTE — ED Notes (Signed)
Report given to RN, [place]. Notified [facility] to p/u transport to [facility]. Patient is ready to v/s are stable.

## 2016-10-12 NOTE — ED Notes (Signed)
Bed: [facility] Expected date:  Expected time:  Means of arrival:  Comments: Hold room 28

## 2016-10-12 NOTE — Progress Notes (Signed)
10/12/16  Pt Accepted at [facility], Bed 304-2, Dr. Parke Poisson accepting, Report to [phone].  Edgar White. Judi Cong, MSW, [place] Work Disposition [phone]

## 2016-10-12 NOTE — ED Notes (Signed)
Gave report to Diane, Therapist, sports for [employer]. Called security to wand patient for [facility] transport.

## 2016-10-12 NOTE — BH Assessment (Addendum)
Tele Assessment Note   Edgar White is an 51 y.o. male.  -Clinician reviewed note by Jeannett Senior, PA.  Edgar White is a 51 y.o. male with history of diabetes, depression, presents to emergency department complaining of suicidal ideations and worsening depression. Patient states that he has been more depressed since last year when his mother passed away. He states in 04-May-2023 of this past year he lost his job and his apartment and became homeless. He was seen in ED at that time, and treated for depression and suicidal ideations. He states he has been seeing a psychiatrist in [place], however has not seen a therapist since then.  Clinician talked with patient about his suicidality.  Patient said that "I just don't want to be here."  "I think about dying all the time and wish I would go in my sleep."  Pt wishes he would have a heart attack or an accident would befall him.  Patient says that he attempted to kill himself in the past by overdosing on medication, this was in December 02, 2011.  Patient says that he has been homeless from 05/03/2016 to December 2017.  In December 3 of 2017 his brother let him start staying with him.  Patient says that he is not sure he wants to go back to stay with brother after they got into a big argument last Tuesday.  For the last week he has stayed with a male friend.  Stress is coming from death of father in December 01, 2012.  Mother died (pt was the caretaker) in Dec 02, 2014 and patient says he still is having hard time adjusting.    Patient denies any HI or A/V hallucinations.  Patient says that he does use marijuana daily.  He is using cocaine about twice per week.  He tested positive for benzos but he said that a friend gave him one valium today when he was having a panic attack.  He reports multiple panic attacks daily.  Patient is followed by Dr. Randel Books at [facility] in [place].  He has no therapist but would like one.  Patient was at [facility] in August '17.  He has been to [facility] three times  in the past.  Has had ECT but said it did not help.  Patient says that prestique worked well for him in the past.    -Clinician discussed patient care with Patriciaann Clan, PA who said that patient meets inpatient care criteria.  Patient accepted to [facility] 304-2 to services of Dr. Parke Poisson.  Clinician called Jeannett Senior, PA and let her know of patient acceptance.  Diagnosis: MDD, recurrent severe; Cannabis use d/o severe; cocaine use d/o moderate  Past Medical History:  Past Medical History:  Diagnosis Date  . Depression   . Diabetes mellitus without complication [facility])     Past Surgical History:  Procedure Laterality Date  . INGUINAL HERNIA REPAIR    . LUMBAR FUSION      Family History: History reviewed. No pertinent family history.  Social History:  reports that he has been smoking Cigarettes.  He has been smoking about 1.00 pack per day. He has never used smokeless tobacco. He reports that he drinks alcohol. He reports that he uses drugs, including Marijuana.  Additional Social History:  Alcohol / Drug Use Pain Medications: None Prescriptions: Citalipram, Metformin (non-compliant)  Ran out of metformin a few months ago. Over the Counter: Folic acid, vitamins History of alcohol / drug use?: Yes Substance #1 Name of Substance 1: Marijuana 1 -  Age of First Use: 17 years ago 1 - Amount (size/oz): 1-2 joints (shared) per day 1 - Frequency: Daily use 1 - Duration: Regular user since November 2016 1 - Last Use / Amount: 02/21 Substance #2 Name of Substance 2: Cocaine (powder) 2 - Age of First Use: 51 years of age 39 - Amount (size/oz): Varies 2 - Frequency: Twice in a week 2 - Duration: Off and on for past 3 years 2 - Last Use / Amount: 02/20  CIWA: CIWA-Ar BP: (!) 112/53 Pulse Rate: 72 COWS:    PATIENT STRENGTHS: (choose at least two) Ability for insight Average or above average intelligence Capable of independent living Communication skills Motivation for  treatment/growth  Allergies: No Known Allergies  Home Medications:  (Not in a hospital admission)  OB/GYN Status:  No LMP for male patient.  General Assessment Data Location of Assessment: [facility] ED TTS Assessment: In system Is this a Tele or Face-to-Face Assessment?: Face-to-Face Is this an Initial Assessment or a Re-assessment for this encounter?: Initial Assessment Marital status: Single Is patient pregnant?: No Pregnancy Status: No Living Arrangements: Other relatives (with brother since 07-27-16) Can pt return to current living arrangement?: Yes (Does not want to return to stay with brother.) Admission Status: Voluntary Is patient capable of signing voluntary admission?: Yes Referral Source: Self/Family/Friend (Pt came over w/ best friend.) Insurance type: Self pay     Crisis Care Plan Living Arrangements: Other relatives (with brother since 07-27-16) Name of Psychiatrist: [facility] in [place] (Dr. Ernie Hew) Name of Therapist: None  Education Status Is patient currently in school?: No Highest grade of school patient has completed: college graduate  Risk to self with the past 6 months Suicidal Ideation: Yes-Currently Present Has patient been a risk to self within the past 6 months prior to admission? : Yes Suicidal Intent: No-Not Currently/Within Last 6 Months Has patient had any suicidal intent within the past 6 months prior to admission? : No Is patient at risk for suicide?: Yes Suicidal Plan?: Yes-Currently Present Has patient had any suicidal plan within the past 6 months prior to admission? : No Specify Current Suicidal Plan: Mentions various ways Access to Means: No What has been your use of drugs/alcohol within the last 12 months?: THC, cocaine Previous Attempts/Gestures: Yes How many times?: 1 Other Self Harm Risks: None Triggers for Past Attempts: Other (Comment) (Ongoing depression in 11/28/2011) Intentional Self Injurious Behavior: None Family Suicide History:  No Recent stressful life event(s): Conflict (Comment), Loss (Comment) (Father died 2012/11/27, mother in [id number]; conflict w/ siblings) Persecutory voices/beliefs?: Yes (Gets to thinking others may be plotting against him.) Depression: Yes Depression Symptoms: Despondent, Insomnia, Isolating, Loss of interest in usual pleasures, Feeling worthless/self pity, Tearfulness Substance abuse history and/or treatment for substance abuse?: Yes Suicide prevention information given to non-admitted patients: Not applicable  Risk to Others within the past 6 months Homicidal Ideation: No Does patient have any lifetime risk of violence toward others beyond the six months prior to admission? : No Thoughts of Harm to Others: No Current Homicidal Intent: No Current Homicidal Plan: No Access to Homicidal Means: No Identified Victim: No one History of harm to others?: No Assessment of Violence: None Noted Violent Behavior Description: None Does patient have access to weapons?: No Criminal Charges Pending?: No Does patient have a court date: No Is patient on probation?: No  Psychosis Hallucinations: None noted Delusions: None noted  Mental Status Report Appearance/Hygiene: Unremarkable, In scrubs Eye Contact: Good Motor Activity: Freedom of movement, Unremarkable Speech:  Logical/coherent Level of Consciousness: Alert Mood: Depressed, Anxious, Despair, Helpless, Sad Affect: Blunted, Depressed Anxiety Level: Panic Attacks Panic attack frequency: Daily, 2-3x / D Most recent panic attack: Today Thought Processes: Coherent, Relevant Judgement: Unimpaired Orientation: Person, Place, Situation Obsessive Compulsive Thoughts/Behaviors: Minimal  Cognitive Functioning Concentration: Decreased Memory: Recent Impaired, Remote Intact IQ: Average Insight: Good Impulse Control: Fair Appetite: Poor Weight Loss:  (Feels like he is losing weight) Weight Gain: 0 Sleep: Decreased Total Hours of Sleep:  (Up and  down.  About 6 hours.) Vegetative Symptoms: None  ADLScreening [facility] Assessment Services) Patient's cognitive ability adequate to safely complete daily activities?: Yes Patient able to express need for assistance with ADLs?: Yes Independently performs ADLs?: Yes (appropriate for developmental age)  Prior Inpatient Therapy Prior Inpatient Therapy: Yes Prior Therapy Dates: 03/2016; 2012, 2013, 2014 Prior Therapy Facilty/Provider(s): [facility]; [facility] Reason for Treatment: inpatient care for SI  Prior Outpatient Therapy Prior Outpatient Therapy: Yes Prior Therapy Dates: 6 years to current Prior Therapy Facilty/Provider(s): [facility] in [place] Reason for Treatment: med management Does patient have an ACCT team?: No Does patient have Intensive In-House Services?  : No Does patient have [facility] services? : No Does patient have P4CC services?: No  ADL Screening (condition at time of admission) Patient's cognitive ability adequate to safely complete daily activities?: Yes Is the patient deaf or have difficulty hearing?: Yes (Right ear may have a lot of wax buildup) Does the patient have difficulty seeing, even when wearing glasses/contacts?: Yes (Vision is affected by diabetes.) Does the patient have difficulty concentrating, remembering, or making decisions?: Yes Patient able to express need for assistance with ADLs?: Yes Does the patient have difficulty dressing or bathing?: No Independently performs ADLs?: Yes (appropriate for developmental age) Does the patient have difficulty walking or climbing stairs?: Yes (Some instances of dizziness when walking.) Weakness of Legs: None Weakness of Arms/Hands: None       Abuse/Neglect Assessment (Assessment to be complete while patient is alone) Physical Abuse: Denies Verbal Abuse: Yes, past (Comment) (Some past emotional abuse.) Sexual Abuse: Denies Exploitation of patient/patient's resources: Denies Self-Neglect: Denies     Regulatory affairs officer  (For Healthcare) Does Patient Have a Medical Advance Directive?: No Would patient like information on creating a medical advance directive?: No - Patient declined    Additional Information 1:1 In Past 12 Months?: No CIRT Risk: No Elopement Risk: No Does patient have medical clearance?: Yes     Disposition:  Disposition Initial Assessment Completed for this Encounter: Yes Disposition of Patient: Other dispositions Other disposition(s): Other (Comment) (Pt to be reviewed by PA)  Raymondo Band 10/12/2016 9:11 PM

## 2016-10-13 ENCOUNTER — Inpatient Hospital Stay (HOSPITAL_COMMUNITY)
Admission: AD | Admit: 2016-10-13 | Discharge: 2016-10-20 | DRG: 897 | Disposition: A | Discharge status: Home / Self Care | Payer: Federal, State, Local not specified - Other | Source: Intra-hospital | Attending: Psychiatry | Admitting: Psychiatry

## 2016-10-13 ENCOUNTER — Encounter (HOSPITAL_COMMUNITY): Payer: Self-pay

## 2016-10-13 DIAGNOSIS — F1490 Cocaine use, unspecified, uncomplicated: Secondary | ICD-10-CM | POA: Diagnosis not present

## 2016-10-13 DIAGNOSIS — F1914 Other psychoactive substance abuse with psychoactive substance-induced mood disorder: Principal | ICD-10-CM | POA: Diagnosis present

## 2016-10-13 DIAGNOSIS — R45851 Suicidal ideations: Secondary | ICD-10-CM | POA: Diagnosis present

## 2016-10-13 DIAGNOSIS — Z818 Family history of other mental and behavioral disorders: Secondary | ICD-10-CM

## 2016-10-13 DIAGNOSIS — F1994 Other psychoactive substance use, unspecified with psychoactive substance-induced mood disorder: Secondary | ICD-10-CM | POA: Diagnosis present

## 2016-10-13 DIAGNOSIS — Z79899 Other long term (current) drug therapy: Secondary | ICD-10-CM | POA: Diagnosis not present

## 2016-10-13 DIAGNOSIS — F1494 Cocaine use, unspecified with cocaine-induced mood disorder: Secondary | ICD-10-CM | POA: Diagnosis present

## 2016-10-13 DIAGNOSIS — F332 Major depressive disorder, recurrent severe without psychotic features: Secondary | ICD-10-CM | POA: Diagnosis not present

## 2016-10-13 DIAGNOSIS — F17210 Nicotine dependence, cigarettes, uncomplicated: Secondary | ICD-10-CM | POA: Diagnosis not present

## 2016-10-13 DIAGNOSIS — E119 Type 2 diabetes mellitus without complications: Secondary | ICD-10-CM | POA: Diagnosis present

## 2016-10-13 DIAGNOSIS — Z7984 Long term (current) use of oral hypoglycemic drugs: Secondary | ICD-10-CM | POA: Diagnosis not present

## 2016-10-13 DIAGNOSIS — F1290 Cannabis use, unspecified, uncomplicated: Secondary | ICD-10-CM | POA: Diagnosis not present

## 2016-10-13 DIAGNOSIS — Z9119 Patient's noncompliance with other medical treatment and regimen: Secondary | ICD-10-CM | POA: Diagnosis not present

## 2016-10-13 DIAGNOSIS — Z590 Homelessness: Secondary | ICD-10-CM

## 2016-10-13 LAB — GLUCOSE, CAPILLARY
GLUCOSE-CAPILLARY: 171 mg/dL — AB (ref 65–99)
GLUCOSE-CAPILLARY: 158 mg/dL — AB (ref 65–99)
GLUCOSE-CAPILLARY: 214 mg/dL — AB (ref 65–99)

## 2016-10-13 LAB — TSH: TSH: 2.144 u[IU]/mL (ref 0.350–4.500)

## 2016-10-13 MED ORDER — CLONIDINE HCL 0.1 MG PO TABS
0.1000 mg | ORAL_TABLET | ORAL | Status: AC
  Administered 2016-10-16: 0.1 mg via ORAL
  Filled 2016-10-13 (×4): qty 1

## 2016-10-13 MED ORDER — LOPERAMIDE HCL 2 MG PO CAPS: 2.0000 mg | ORAL_CAPSULE | ORAL | Status: AC | PRN

## 2016-10-13 MED ORDER — TRAZODONE HCL 100 MG PO TABS
100.0000 mg | ORAL_TABLET | Freq: Every evening | ORAL | Status: DC | PRN
  Administered 2016-10-13: 100 mg via ORAL
  Filled 2016-10-13: qty 1

## 2016-10-13 MED ORDER — HYDROXYZINE HCL 25 MG PO TABS
25.0000 mg | ORAL_TABLET | Freq: Four times a day (QID) | ORAL | Status: AC | PRN
  Administered 2016-10-17 (×2): 25 mg via ORAL
  Filled 2016-10-13 (×2): qty 1

## 2016-10-13 MED ORDER — METHOCARBAMOL 500 MG PO TABS: 500.0000 mg | ORAL_TABLET | Freq: Three times a day (TID) | ORAL | Status: AC | PRN

## 2016-10-13 MED ORDER — CLONIDINE HCL 0.1 MG PO TABS
0.1000 mg | ORAL_TABLET | Freq: Every day | ORAL | Status: AC
  Filled 2016-10-13 (×2): qty 1

## 2016-10-13 MED ORDER — TOPIRAMATE 25 MG PO TABS
25.0000 mg | ORAL_TABLET | Freq: Two times a day (BID) | ORAL | Status: DC
  Administered 2016-10-13 – 2016-10-16 (×7): 25 mg via ORAL
  Filled 2016-10-13 (×11): qty 1

## 2016-10-13 MED ORDER — METFORMIN HCL 850 MG PO TABS
850.0000 mg | ORAL_TABLET | Freq: Two times a day (BID) | ORAL | Status: DC
  Administered 2016-10-13: 850 mg via ORAL
  Filled 2016-10-13 (×3): qty 1

## 2016-10-13 MED ORDER — METFORMIN HCL 500 MG PO TABS
500.0000 mg | ORAL_TABLET | Freq: Two times a day (BID) | ORAL | Status: DC
  Administered 2016-10-13 – 2016-10-20 (×14): 500 mg via ORAL
  Filled 2016-10-13 (×7): qty 1
  Filled 2016-10-13: qty 14
  Filled 2016-10-13 (×2): qty 1
  Filled 2016-10-13: qty 14
  Filled 2016-10-13 (×3): qty 1
  Filled 2016-10-13: qty 14
  Filled 2016-10-13: qty 1
  Filled 2016-10-13: qty 14
  Filled 2016-10-13 (×3): qty 1

## 2016-10-13 MED ORDER — QUETIAPINE FUMARATE ER 50 MG PO TB24
150.0000 mg | ORAL_TABLET | Freq: Every day | ORAL | Status: DC
  Filled 2016-10-13 (×2): qty 3

## 2016-10-13 MED ORDER — NAPROXEN 500 MG PO TABS
500.0000 mg | ORAL_TABLET | Freq: Two times a day (BID) | ORAL | Status: DC | PRN
  Administered 2016-10-16 (×2): 500 mg via ORAL
  Filled 2016-10-13 (×2): qty 1

## 2016-10-13 MED ORDER — VENLAFAXINE HCL ER 75 MG PO CP24
75.0000 mg | ORAL_CAPSULE | Freq: Every day | ORAL | Status: DC
  Administered 2016-10-13 – 2016-10-18 (×6): 75 mg via ORAL
  Filled 2016-10-13 (×8): qty 1

## 2016-10-13 MED ORDER — ONDANSETRON 4 MG PO TBDP: 4.0000 mg | ORAL_TABLET | Freq: Four times a day (QID) | ORAL | Status: AC | PRN

## 2016-10-13 MED ORDER — MAGNESIUM HYDROXIDE 400 MG/5ML PO SUSP: 30.0000 mL | Freq: Every day | ORAL | Status: DC | PRN

## 2016-10-13 MED ORDER — INSULIN ASPART 100 UNIT/ML ~~LOC~~ SOLN
0.0000 [IU] | Freq: Three times a day (TID) | SUBCUTANEOUS | Status: DC
  Administered 2016-10-13: 7 [IU] via SUBCUTANEOUS
  Administered 2016-10-13 (×2): 4 [IU] via SUBCUTANEOUS
  Administered 2016-10-14: 3 [IU] via SUBCUTANEOUS
  Administered 2016-10-14 – 2016-10-15 (×3): 4 [IU] via SUBCUTANEOUS
  Administered 2016-10-15 – 2016-10-16 (×3): 3 [IU] via SUBCUTANEOUS
  Administered 2016-10-16: 20 [IU] via SUBCUTANEOUS
  Administered 2016-10-17: 4 [IU] via SUBCUTANEOUS
  Administered 2016-10-17 – 2016-10-18 (×3): 3 [IU] via SUBCUTANEOUS
  Administered 2016-10-19 (×3): 4 [IU] via SUBCUTANEOUS
  Administered 2016-10-20 (×2): 3 [IU] via SUBCUTANEOUS

## 2016-10-13 MED ORDER — NICOTINE 21 MG/24HR TD PT24
21.0000 mg | MEDICATED_PATCH | Freq: Every day | TRANSDERMAL | Status: DC
  Administered 2016-10-13 – 2016-10-20 (×8): 21 mg via TRANSDERMAL
  Filled 2016-10-13 (×10): qty 1

## 2016-10-13 MED ORDER — DICYCLOMINE HCL 20 MG PO TABS: 20.0000 mg | ORAL_TABLET | Freq: Four times a day (QID) | ORAL | Status: AC | PRN

## 2016-10-13 MED ORDER — ALUM & MAG HYDROXIDE-SIMETH 200-200-20 MG/5ML PO SUSP
30.0000 mL | ORAL | Status: DC | PRN
  Administered 2016-10-13: 30 mL via ORAL
  Filled 2016-10-13: qty 30

## 2016-10-13 MED ORDER — CLONIDINE HCL 0.1 MG PO TABS
0.1000 mg | ORAL_TABLET | Freq: Four times a day (QID) | ORAL | Status: AC
  Administered 2016-10-13 – 2016-10-14 (×8): 0.1 mg via ORAL
  Filled 2016-10-13 (×8): qty 1

## 2016-10-13 MED ORDER — ACETAMINOPHEN 325 MG PO TABS
650.0000 mg | ORAL_TABLET | Freq: Four times a day (QID) | ORAL | Status: DC | PRN
Start: 2016-10-13 — End: 2016-10-20
  Administered 2016-10-16: 650 mg via ORAL
  Filled 2016-10-13: qty 2

## 2016-10-13 MED ORDER — DULOXETINE HCL 20 MG PO CPEP
20.0000 mg | ORAL_CAPSULE | Freq: Two times a day (BID) | ORAL | Status: DC
  Administered 2016-10-13: 20 mg via ORAL
  Filled 2016-10-13 (×3): qty 1

## 2016-10-13 NOTE — H&P (Signed)
Psychiatric Admission Assessment Adult  Patient Identification: Edgar White MRN:  [id number] Date of Evaluation:  10/13/2016 Chief Complaint:  MDD REC SEV Principal Diagnosis: substance induced mood disorder Diagnosis:   Patient Active Problem List   Diagnosis Date Noted  . Substance induced mood disorder ([place]) [F19.94] 10/13/2016  . Diabetes mellitus without complication ([place]) [id number] 06/08/2016  . Anxiety state [F41.1]   . Polysubstance abuse [F19.10]   . Major depressive disorder, recurrent severe without psychotic features ([place]) [F33.2] 04/13/2016   History of Present Illness: Patient is seen and examined. Patient is a 51 YO male with a reported history of bipolar disorder, depression, personality disorder and substance abuse who presented to the ED with suicidal ideation. The patient stated that he has had increased psychosocial stressors over the last several years and "they have taken a toll on me". 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 His father also died during that period of time. After her death his family sold her hose and he was asked to leave. He received approximately $38K and moved to [place]. He became involved in a relationship, and ended up spending all the money in 7 months "partying". He has a college degree but has not been able to maintain employment. He has a history of 4 previous hospitalizations, including one at [facility] last year. He had been hospitalized at [facility] 3 times and received ECT which "didn't work". On his last admission he was placed on celexa and zyprexa. The celexa wasn't working" then and he eventually stopped it. He also read about the zyprexa and stopped in when he found out it was for "schizophrenia". He admitted to using cocaine, THC and alcohol. He wanted to OD. Associated Signs/Symptoms: Depression Symptoms:  depressed mood, anhedonia, insomnia, psychomotor  retardation, fatigue, feelings of worthlessness/guilt, hopelessness, suicidal thoughts with specific plan, suicidal attempt, (Hypo) Manic Symptoms:  Impulsivity, Anxiety Symptoms:  Excessive Worry, Psychotic Symptoms:  none PTSD Symptoms: NA Total Time spent with patient: 45 minutes  Past Psychiatric History: 4 previous psychiatric admissions for depression and suicidality  Is the patient at risk to self? Yes.    Has the patient been a risk to self in the past 6 months? Yes.    Has the patient been a risk to self within the distant past? Yes.    Is the patient a risk to others? No.  Has the patient been a risk to others in the past 6 months? No.  Has the patient been a risk to others within the distant past? No.   Prior Inpatient Therapy:  4 previous in patient admissions Prior Outpatient Therapy:  non-compliant recently  Alcohol Screening: Patient refused Alcohol Screening Tool: Yes ("I hardly ever drink") Substance Abuse History in the last 12 months:  Yes.   Consequences of Substance Abuse: Negative Previous Psychotropic Medications: Yes  Psychological Evaluations: No  Past Medical History:  Past Medical History:  Diagnosis Date  . Depression   . Diabetes mellitus without complication [facility])     Past Surgical History:  Procedure Laterality Date  . INGUINAL HERNIA REPAIR    . LUMBAR FUSION     Family History: History reviewed. No pertinent family history. Family Psychiatric  History: multiple family members with depression as well as substance issues Tobacco Screening: Have you used any form of tobacco in the last 30 days? (Cigarettes, Smokeless Tobacco, Cigars, and/or Pipes): Yes Tobacco use, Select all that apply: 5 or  more cigarettes per day Are you interested in Tobacco Cessation Medications?: Yes, will notify MD for an order Counseled patient on smoking cessation including recognizing danger situations, developing coping skills and basic information about quitting  provided: Refused/Declined practical counseling Social History:  History  Alcohol Use  . Yes    Comment: once a week     History  Drug Use  . Types: Marijuana, Cocaine    Comment: Uses daily. Last used: this morning    Additional Social History:      Pain Medications: None Prescriptions: currently taking Celexa and Trazadone prn Over the Counter: Folic acid, vitamins History of alcohol / drug use?: Yes Longest period of sobriety (when/how long): Not reported Negative Consequences of Use: Financial Name of Substance 1: Marijuana 1 - Age of First Use: 17 years ago 1 - Amount (size/oz): 1-2 joints (shared) per day 1 - Frequency: Daily use 1 - Duration: Regular user since November 2016 1 - Last Use / Amount: 02/21 Name of Substance 2: Cocaine (powder) 2 - Age of First Use: 51 years of age 16 - Amount (size/oz): Varies 2 - Frequency: Twice in a week 2 - Duration: since I was 43 2 - Last Use / Amount: 02/20                Allergies:  No Known Allergies Lab Results:  Results for orders placed or performed during the hospital encounter of 10/13/16 (from the past 48 hour(s))  Glucose, capillary     Status: Abnormal   Collection Time: 10/13/16  6:25 AM  Result Value Ref Range   Glucose-Capillary 171 (H) 65 - 99 mg/dL    Blood Alcohol level:  Lab Results  Component Value Date   ETH <5 10/12/2016   ETH <5 [id number]    Metabolic Disorder Labs:  Lab Results  Component Value Date   HGBA1C 6.5 (H) 04/15/2016   MPG 140 04/15/2016   No results found for: PROLACTIN Lab Results  Component Value Date   CHOL 207 (H) 04/15/2016   TRIG 174 (H) 04/15/2016   HDL 34 (L) 04/15/2016   CHOLHDL 6.1 04/15/2016   VLDL 35 04/15/2016   LDLCALC 138 (H) 04/15/2016   LDLCALC SEE COMMENT 02/09/2012    Current Medications: Current Facility-Administered Medications  Medication Dose Route Frequency Provider Last Rate Last Dose  . acetaminophen (TYLENOL) tablet 650 mg  650 mg Oral  Q6H PRN Laverle Hobby, PA-C      . alum & mag hydroxide-simeth (MAALOX/MYLANTA) 200-200-20 MG/5ML suspension 30 mL  30 mL Oral Q4H PRN Laverle Hobby, PA-C      . cloNIDine (CATAPRES) tablet 0.1 mg  0.1 mg Oral QID Laverle Hobby, PA-C   0.1 mg at 10/13/16 0831   Followed by  . [START ON 10/15/2016] cloNIDine (CATAPRES) tablet 0.1 mg  0.1 mg Oral BH-qamhs Spencer E Simon, PA-C       Followed by  . [START ON 10/17/2016] cloNIDine (CATAPRES) tablet 0.1 mg  0.1 mg Oral QAC breakfast Laverle Hobby, PA-C      . dicyclomine (BENTYL) tablet 20 mg  20 mg Oral Q6H PRN Laverle Hobby, PA-C      . hydrOXYzine (ATARAX/VISTARIL) tablet 25 mg  25 mg Oral Q6H PRN Laverle Hobby, PA-C      . insulin aspart (novoLOG) injection 0-20 Units  0-20 Units Subcutaneous TID WC Laverle Hobby, PA-C   4 Units at 10/13/16 0645  . loperamide (IMODIUM) capsule 2-4  mg  2-4 mg Oral PRN Laverle Hobby, PA-C      . magnesium hydroxide (MILK OF MAGNESIA) suspension 30 mL  30 mL Oral Daily PRN Laverle Hobby, PA-C      . metFORMIN (GLUCOPHAGE) tablet 850 mg  850 mg Oral BID WC Laverle Hobby, PA-C   850 mg at 10/13/16 0831  . methocarbamol (ROBAXIN) tablet 500 mg  500 mg Oral Q8H PRN Laverle Hobby, PA-C      . naproxen (NAPROSYN) tablet 500 mg  500 mg Oral BID PRN Laverle Hobby, PA-C      . nicotine (NICODERM CQ - dosed in mg/24 hours) patch 21 mg  21 mg Transdermal Daily Laverle Hobby, PA-C   21 mg at 10/13/16 0831  . ondansetron (ZOFRAN-ODT) disintegrating tablet 4 mg  4 mg Oral Q6H PRN Laverle Hobby, PA-C      . topiramate (TOPAMAX) tablet 25 mg  25 mg Oral BID Laverle Hobby, PA-C   25 mg at 10/13/16 [id number]   PTA Medications: Prescriptions Prior to Admission  Medication Sig Dispense Refill Last Dose  . citalopram (CELEXA) 40 MG tablet Take 1 tablet (40 mg total) by mouth daily. For depression 30 tablet 1 10/12/2016 at Unknown time  . hydrOXYzine (ATARAX/VISTARIL) 25 MG tablet Take 1 tablet (25 mg total) by  mouth every 6 (six) hours as needed for anxiety. (Patient not taking: Reported on 10/12/2016) 60 tablet 1 Not Taking at Unknown time  . metFORMIN (GLUCOPHAGE) 500 MG tablet Take 1 tablet (500 mg total) by mouth 2 (two) times daily with a meal. (Patient not taking: Reported on 10/12/2016) 60 tablet 2 Not Taking at Unknown time  . OLANZapine (ZYPREXA) 2.5 MG tablet Take 1 tablet (2.5 mg total) by mouth at bedtime. For mood control/agitation (Patient not taking: Reported on 10/12/2016) 30 tablet 1 Not Taking at Unknown time  . traZODone (DESYREL) 50 MG tablet Take 1 tablet (50 mg total) by mouth at bedtime and may repeat dose one time if needed. For sleep (Patient not taking: Reported on 10/12/2016) 30 tablet 1 Not Taking at Unknown time    Musculoskeletal: Strength & Muscle Tone: within normal limits Gait & Station: normal Patient leans: N/A  Psychiatric Specialty Exam: Physical Exam see admission H&P in ED  ROS as per above  Blood pressure 127/81, pulse 90, temperature 97.6 F (36.4 C), temperature source Oral, resp. rate 16, height 6' (1.829 m), weight 78 kg (172 lb), SpO2 98 %.Body mass index is 23.33 kg/m.  General Appearance: Casual  Eye Contact:  Fair  Speech:  Slow  Volume:  Decreased  Mood:  Depressed  Affect:  Congruent  Thought Process:  Coherent  Orientation:  Full (Time, Place, and Person)  Thought Content:  Logical  Suicidal Thoughts:  Yes.  with intent/plan  Homicidal Thoughts:  No  Memory:  Immediate;   Good  Judgement:  Impaired  Insight:  Lacking  Psychomotor Activity:  Decreased  Concentration:  Concentration: Good  Recall:  Good  Fund of Knowledge:  Good  Language:  Good  Akathisia:  No  Handed:  Right  AIMS (if indicated):     Assets:  Communication Skills Desire for Improvement Physical Health Resilience Talents/Skills  ADL's:  Intact  Cognition:  WNL  Sleep:  Number of Hours: 2.75 (early morning admission)    Treatment Plan Summary: Daily contact with  patient to assess and evaluate symptoms and progress in treatment, Medication management and Plan 1)  stop celexa, 2) start Effexor XR 75 mg po q day, 3) continue hydroxyzine, 4) stop seroquel XR, 5) restart metformin at 500 mg BID, 6) trazodone prn, 7) collateral information, 8) therapeutic [place], 9) monitor for suicidality and withdrawal.  Observation Level/Precautions:  Detox 15 minute checks  Laboratory:  CBC Chemistry Profile Folic Acid GGT HbAIC UDS UA  Psychotherapy:    Medications:    Consultations:    Discharge Concerns:    Estimated LOS:  Other:     Physician Treatment Plan for Primary Diagnosis: <principal problem not specified> Long Term Goal(s): Improvement in symptoms so as ready for discharge  Short Term Goals: Ability to identify changes in lifestyle to reduce recurrence of condition will improve, Ability to verbalize feelings will improve, Ability to disclose and discuss suicidal ideas, Ability to demonstrate self-control will improve, Ability to identify and develop effective coping behaviors will improve, Ability to maintain clinical measurements within normal limits will improve, Compliance with prescribed medications will improve and Ability to identify triggers associated with substance abuse/mental health issues will improve  Physician Treatment Plan for Secondary Diagnosis: Active Problems:   Substance induced mood disorder ([place])  Long Term Goal(s): Improvement in symptoms so as ready for discharge  Short Term Goals: Ability to identify changes in lifestyle to reduce recurrence of condition will improve, Ability to verbalize feelings will improve, Ability to disclose and discuss suicidal ideas, Ability to demonstrate self-control will improve, Ability to identify and develop effective coping behaviors will improve, Ability to maintain clinical measurements within normal limits will improve, Compliance with prescribed medications will improve and Ability to identify  triggers associated with substance abuse/mental health issues will improve  I certify that inpatient services furnished can reasonably be expected to improve the patient's condition.    Sharma Covert, MD 2/22/201811:25 AM

## 2016-10-13 NOTE — Progress Notes (Signed)
Patient ID: TYVON OSBUN, male   DOB: [birth year], 51 y.o.   MRN: [id number]   51 year old male presents to [facility] requesting to be put back on his medical and psychiatric medications. Pt affect is blunted, interaction assertive. Pt reports that he has been using marijuana and cocaine daily for the past month. He has had trouble staying asleep and eating due to "racing thoughts". Reports that he "just forgets" to do his ADL's. Pt also endorses paranoia, that "my family and friends are talking about me or trying to plan an intervention." Pt reports that he is currently living with his brother but they got into an argument and he is not sure he will be able to continue staying there. Reports his two biggest supports are his sisters. Past medical history includes depression and diabetes mellitus. Reports that "I have been hospitalized at least five times in the past five years." Endorses some passive SI, does not voice specific ideation. Denies AVH and HI. Consents signed, skin/belongings search completed and pt oriented to unit. Pt stable at this time. Pt given the opportunity to express concerns and ask questions. Pt given toiletries. Will continue to monitor.

## 2016-10-13 NOTE — Tx Team (Signed)
Initial Treatment Plan 10/13/2016 3:10 AM Edgar White [id number]    PATIENT STRESSORS: Financial difficulties Marital or family conflict Medication change or noncompliance Substance abuse   PATIENT STRENGTHS: Ability for insight Communication skills General fund of knowledge Religious Affiliation   PATIENT IDENTIFIED PROBLEMS: Substance abuse - "marijuana and cocaine"  Depression - "get back to me, be a part of functioning society"  Suicide risk   "Ive been paranoid that others are talking about me, planning an intervention"  "get back on my meds"             DISCHARGE CRITERIA:  Ability to meet basic life and health needs Adequate post-discharge living arrangements Improved stabilization in mood, thinking, and/or behavior Verbal commitment to aftercare and medication compliance  PRELIMINARY DISCHARGE PLAN: Attend aftercare/continuing care group Attend PHP/IOP  PATIENT/FAMILY INVOLVEMENT: This treatment plan has been presented to and reviewed with the patient, Edgar White .The patient and family have been given the opportunity to ask questions and make suggestions.  Elenore Rota, RN 10/13/2016, 3:10 AM

## 2016-10-13 NOTE — BHH Group Notes (Signed)
The focus of this group is to educate the patient on the purpose and policies of crisis stabilization and provide a format to answer questions about their admission.  The group details unit policies and expectations of patients while admitted.  Patient did not attend group. 

## 2016-10-13 NOTE — Progress Notes (Signed)
Patient ID: Edgar White, male   DOB: [birth year], 51 y.o.   MRN: [id number]  Pt currently presents with a masked affect and anxious behavior. Pt reports to writer that their goal is to "be less depressed."  Pt reports good sleep with current medication regimen. Pt reports feeling somewhat better.   Pt provided with medications per providers orders. Pt's labs and vitals were monitored throughout the night. Pt given a 1:1 about emotional and mental status. Pt supported and encouraged to express concerns and questions. Pt educated on medications.  Pt's safety ensured with 15 minute and environmental checks. Endorsing passive SI, no plan. Pt currently denies HI and A/V hallucinations. Pt verbally agrees to seek staff if SI/HI or A/VH occurs and to consult with staff before acting on any harmful thoughts. Will continue POC.

## 2016-10-13 NOTE — BHH Group Notes (Signed)
[facility] Mental [facility] Association Group Therapy 10/13/2016 1:15pm  Type of Therapy: Mental Health Association Presentation  Participation Level: Pt invited. Did not attend.  Kara Mead. Marshell Levan, MSW, [place] 10/13/2016 4:10 PM

## 2016-10-13 NOTE — Progress Notes (Signed)
NUTRITION ASSESSMENT  Pt identified as at risk on the Malnutrition Screen Tool  INTERVENTION: 1. Educated patient on the importance of nutrition and encouraged intake of food and beverages. 2. Discussed weight goals. 3. Supplements: none at this time.  NUTRITION DIAGNOSIS: None at this time.   Goal: Pt to meet >/= 90% of their estimated nutrition needs.  Monitor:  PO intake  Assessment:  Pt admitted for SI with worsening depression and anxiety. He was homeless but has recently moved in with his brother. Weight has been stable since October 2017. Continue to encourage PO intakes of meals and snacks.   51 y.o. male  Height: Ht Readings from Last 1 Encounters:  10/13/16 6' (1.829 m)    Weight: Wt Readings from Last 1 Encounters:  10/13/16 172 lb (78 kg)    Weight Hx: Wt Readings from Last 10 Encounters:  10/13/16 172 lb (78 kg)  10/12/16 179 lb (81.2 kg)  06/08/16 168 lb (76.2 kg)  04/13/16 158 lb (71.7 kg)    BMI:  Body mass index is 23.33 kg/m. Pt meets criteria for normal weight based on current BMI.  Estimated Nutritional Needs: Kcal: 25-30 kcal/kg Protein: > 1 gram protein/kg Fluid: 1 ml/kcal  Diet Order: Diet regular Room service appropriate? Yes; Fluid consistency: Thin Pt is also offered choice of unit snacks mid-morning and mid-afternoon.  Pt is eating as desired.   Lab results and medications reviewed.     Jarome Matin, MS, RD, LDN, [facility] Inpatient Clinical Dietitian Pager # [phone] After hours/weekend pager # [phone]

## 2016-10-13 NOTE — BHH Counselor (Signed)
Adult Comprehensive Assessment  Patient ID: BAIDEN LYNES, male   DOB: 01-11-1966, 51 y.o.   MRN: [id number]  Information Source:    Current Stressors:  Educational / Learning stressors: None reported  Employment / Job issues: Currently unemployed  Family Relationships: Relationships with his siblings are Administrator, sports / Lack of resources (include bankruptcy): Limited resources  Housing / Lack of housing: Homeless, staying with various family members  Physical health (include injuries & life threatening diseases): Diabetes  Social relationships: None reported  Substance abuse: THC use daily, occasional cocaine use  Bereavement / Loss: Father died in 12-03-2012, mother died in 12-04-14, break up in 2016/05/05  Living/Environment/Situation:  Living Arrangements: Other relatives Living conditions (as described by patient or guardian): Pt is currently living with his brother in [place]  How long has patient lived in current situation?: A few months  What is atmosphere in current home: Comfortable, Temporary  Family History:  Marital status: Single (Pt went through a break up in 05/05/2016. He is still recovering from this.) Are you sexually active?: No What is your sexual orientation?: Gay Has your sexual activity been affected by drugs, alcohol, medication, or emotional stress?: Pt states he is "not able to perform" due to medications and stress  Does patient have children?: No  Childhood History:  By whom was/is the patient raised?: Both parents Description of patient's relationship with caregiver when they were a child: Close with parents, closer with his mother  Patient's description of current relationship with people who raised him/her: Parents are both deceased How were you disciplined when you got in trouble as a child/adolescent?: Pt reports he was very spoiled as a child and was not disciplined often. Does patient have siblings?: Yes Number of Siblings: 3 (1 brother, 2  sisters) Description of patient's current relationship with siblings: Pt had a conflictual relationship with his siblings after the death of his mother. However, recently their relationship has improved and he is getting close with them again. 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 Currently a student?: No Learning disability?: No  Employment/Work Situation:   Employment situation: Unemployed Patient's job has been impacted by current illness:  (NA) What is the longest time patient has a held a job?: 7 years Where was the patient employed at that time?: Therapist, 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 Has patient ever been in the [employer]?: No Has patient ever served in combat?: No Did You Receive Any Psychiatric Treatment/Services While in Passenger transport manager?: No  Financial Resources:   Financial resources: No income Does patient have a Programmer, applications or guardian?: No  Alcohol/Substance Abuse:   What has been your use of drugs/alcohol within the last 12 months?: THC use daily, occasional cocaine use Alcohol/Substance Abuse Treatment Hx: Denies past history Has alcohol/substance abuse ever caused legal problems?: No  Social Support System:   Heritage manager System: Good Describe Community Support System: Friends, family "I have a lot of people that worry about me." Type of faith/religion: Darrick Meigs How does patient's faith help to cope with current illness?: Pt goes to church  on a regualr basis   Leisure/Recreation:   Leisure and  Hobbies: Exercise, walking dogs, spending time with friends   Strengths/Needs:   What things does the patient do well?: Painting, decorating, cleaning. cooking, making people laugh In what areas does patient struggle / problems for patient: Getting on the right meds, "getting back out into the world", paranoia  Discharge Plan:   Does patient have access to transportation?: Yes (Family will transport ) Will patient be returning to same living situation after discharge?: Yes Currently receiving community mental health services: Yes (From Whom) ([facility] but has not been since Dec) Does patient have financial barriers related to discharge medications?: Yes Patient description of barriers related to discharge medications: Limited resources   Summary/Recommendations:    Patient is a 51 yo male who presented to the hospital with depression and SI. Pt reports his primary triggers for admission include the death of his mother, financial concerns, housing concerns, and a recent break up in August of 2017. Pt was seen on an outpatient basis at [facility] in Dec and is agreeable to retuning to them for services. Pt's supports include his siblings, and his friends. Patient will benefit from crisis stabilization, medication evaluation, group therapy and pyschoeducation, in addition to case management for discharge planning. At discharge, it is recommended that pt remain compliant with the established discharge plan and continue treatment.  Hubert Azure  10/13/2016

## 2016-10-13 NOTE — BHH Suicide Risk Assessment (Signed)
[facility] Admission Suicide Risk Assessment   Nursing information obtained from:    Demographic factors:    Current Mental Status:    Loss Factors:    Historical Factors:    Risk Reduction Factors:     Total Time spent with patient: 45 minutes Principal Problem: substance induced mood disorder Diagnosis:   Patient Active Problem List   Diagnosis Date Noted  . Substance induced mood disorder ([place]) [F19.94] 10/13/2016  . Diabetes mellitus without complication ([place]) [id number] 06/08/2016  . Anxiety state [F41.1]   . Polysubstance abuse [F19.10]   . Major depressive disorder, recurrent severe without psychotic features ([place]) [F33.2] 04/13/2016   Subjective Data: Patient is a 51 YO male with history of depression, anxiety, substance abuse and suicidal thoughts  Continued Clinical Symptoms:    The "Alcohol Use Disorders Identification Test", Guidelines for Use in Primary Care, Second Edition.  World Pharmacologist [facility]). Score between 0-7:  no or low risk or alcohol related problems. Score between 8-15:  moderate risk of alcohol related problems. Score between 16-19:  high risk of alcohol related problems. Score 20 or above:  warrants further diagnostic evaluation for alcohol dependence and treatment.   CLINICAL FACTORS:   Bipolar Disorder:   Depressive phase Dysthymia Alcohol/Substance Abuse/Dependencies Personality Disorders:   Cluster B   Musculoskeletal: Strength & Muscle Tone: within normal limits Gait & Station: normal Patient leans: N/A  Psychiatric Specialty Exam: Physical Exam  ROS  Blood pressure 127/81, pulse 90, temperature 97.6 F (36.4 C), temperature source Oral, resp. rate 16, height 6' (1.829 m), weight 78 kg (172 lb), SpO2 98 %.Body mass index is 23.33 kg/m.  General Appearance: Casual  Eye Contact:  Fair  Speech:  Clear and Coherent  Volume:  Normal  Mood:  Depressed  Affect:  Congruent  Thought Process:  Coherent  Orientation:  Full (Time, Place, and  Person)  Thought Content:  Logical  Suicidal Thoughts:  Yes.  without intent/plan  Homicidal Thoughts:  No  Memory:  Immediate;   Good  Judgement:  Impaired  Insight:  Lacking  Psychomotor Activity:  Normal  Concentration:  Concentration: Good  Recall:  Good  Fund of Knowledge:  Good  Language:  Good  Akathisia:  No  Handed:  Right  AIMS (if indicated):     Assets:  Communication Skills Desire for Improvement Physical Health Resilience Talents/Skills  ADL's:  Intact  Cognition:  WNL  Sleep:  Number of Hours: 2.75 (early morning admission)      COGNITIVE FEATURES THAT CONTRIBUTE TO RISK:  None    SUICIDE RISK:   Mild:  Suicidal ideation of limited frequency, intensity, duration, and specificity.  There are no identifiable plans, no associated intent, mild dysphoria and related symptoms, good self-control (both objective and subjective assessment), few other risk factors, and identifiable protective factors, including available and accessible social support.  PLAN OF CARE: 1) admit to hospital, 2) stop celexa, 3) patient previously did well with Pristiq but financial issues are an issue so will try Effexor XR and titrate, 4) was placed on seroquel XR and topamax in [place], will stop XR ( patient was on zyprexa and stooped it himself over fears of medical complications), 5) monitor for withdrawal issues, 6) therapeutic [place], 7) may need placement ( patient stated "my family wants me here for a long time")  I certify that inpatient services furnished can reasonably be expected to improve the patient's condition.   Sharma Covert, MD 10/13/2016, 11:19 AM

## 2016-10-13 NOTE — Progress Notes (Signed)
Patient did not attend wrap up group. 

## 2016-10-13 NOTE — Progress Notes (Signed)
Patient denies SI, HI and AVH.  Patient reported some anxiety and withdrawal symptoms, but states they are manageable.    Assess patient for safety, offer medications as prescribed, engage patient in 1:1 staff talks.   Patient able to contract for safety.

## 2016-10-13 NOTE — [facility] Suicide Risk Assessment (Signed)
[place] INPATIENT:  Family/Significant Other Suicide Prevention Education  Suicide Prevention Education:  Education Completed; Quentin Ore (sister [phone]), has been identified by the patient as the family member/significant other with whom the patient will be residing, and identified as the person(s) who will aid the patient in the event of a mental health crisis (suicidal ideations/suicide attempt).  With written consent from the patient, the family member/significant other has been provided the following suicide prevention education, prior to the and/or following the discharge of the patient.  The suicide prevention education provided includes the following:  Suicide risk factors  Suicide prevention and interventions  National Suicide Hotline telephone number  [facility] assessment telephone number  [facility] Emergency Assistance [place] and/or Residential Mobile Crisis Unit telephone number  Request made of family/significant other to:  Remove weapons (e.g., guns, rifles, knives), all items previously/currently identified as safety concern.    Remove drugs/medications (over-the-counter, prescriptions, illicit drugs), all items previously/currently identified as a safety concern.  The family member/significant other verbalizes understanding of the suicide prevention education information provided.  The family member/significant other agrees to remove the items of safety concern listed above.  Georga Kaufmann 10/13/2016, 3:21 PM

## 2016-10-13 NOTE — Tx Team (Signed)
Interdisciplinary Treatment and Diagnostic Plan Update 10/13/2016 Time of Session: 9:30am  Edgar White  MRN: [id number]  Principal Diagnosis:  Substance induced mood disorder  Secondary Diagnoses: Active Problems:   Substance induced mood disorder (HCC)   Current Medications:  Current Facility-Administered Medications  Medication Dose Route Frequency Provider Last Rate Last Dose  . acetaminophen (TYLENOL) tablet 650 mg  650 mg Oral Q6H PRN Laverle Hobby, PA-C      . alum & mag hydroxide-simeth (MAALOX/MYLANTA) 200-200-20 MG/5ML suspension 30 mL  30 mL Oral Q4H PRN Laverle Hobby, PA-C      . cloNIDine (CATAPRES) tablet 0.1 mg  0.1 mg Oral QID Laverle Hobby, PA-C   0.1 mg at 10/13/16 0831   Followed by  . [START ON 10/15/2016] cloNIDine (CATAPRES) tablet 0.1 mg  0.1 mg Oral BH-qamhs Spencer E Simon, PA-C       Followed by  . [START ON 10/17/2016] cloNIDine (CATAPRES) tablet 0.1 mg  0.1 mg Oral QAC breakfast Laverle Hobby, PA-C      . dicyclomine (BENTYL) tablet 20 mg  20 mg Oral Q6H PRN Laverle Hobby, PA-C      . hydrOXYzine (ATARAX/VISTARIL) tablet 25 mg  25 mg Oral Q6H PRN Laverle Hobby, PA-C      . insulin aspart (novoLOG) injection 0-20 Units  0-20 Units Subcutaneous TID WC Laverle Hobby, PA-C   4 Units at 10/13/16 0645  . loperamide (IMODIUM) capsule 2-4 mg  2-4 mg Oral PRN Laverle Hobby, PA-C      . magnesium hydroxide (MILK OF MAGNESIA) suspension 30 mL  30 mL Oral Daily PRN Laverle Hobby, PA-C      . metFORMIN (GLUCOPHAGE) tablet 850 mg  850 mg Oral BID WC Laverle Hobby, PA-C   850 mg at 10/13/16 0831  . methocarbamol (ROBAXIN) tablet 500 mg  500 mg Oral Q8H PRN Laverle Hobby, PA-C      . naproxen (NAPROSYN) tablet 500 mg  500 mg Oral BID PRN Laverle Hobby, PA-C      . nicotine (NICODERM CQ - dosed in mg/24 hours) patch 21 mg  21 mg Transdermal Daily Laverle Hobby, PA-C   21 mg at 10/13/16 0831  . ondansetron (ZOFRAN-ODT) disintegrating tablet 4 mg  4 mg  Oral Q6H PRN Laverle Hobby, PA-C      . topiramate (TOPAMAX) tablet 25 mg  25 mg Oral BID Laverle Hobby, PA-C   25 mg at 10/13/16 0998    PTA Medications: Prescriptions Prior to Admission  Medication Sig Dispense Refill Last Dose  . citalopram (CELEXA) 40 MG tablet Take 1 tablet (40 mg total) by mouth daily. For depression 30 tablet 1 10/12/2016 at Unknown time  . hydrOXYzine (ATARAX/VISTARIL) 25 MG tablet Take 1 tablet (25 mg total) by mouth every 6 (six) hours as needed for anxiety. (Patient not taking: Reported on 10/12/2016) 60 tablet 1 Not Taking at Unknown time  . metFORMIN (GLUCOPHAGE) 500 MG tablet Take 1 tablet (500 mg total) by mouth 2 (two) times daily with a meal. (Patient not taking: Reported on 10/12/2016) 60 tablet 2 Not Taking at Unknown time  . OLANZapine (ZYPREXA) 2.5 MG tablet Take 1 tablet (2.5 mg total) by mouth at bedtime. For mood control/agitation (Patient not taking: Reported on 10/12/2016) 30 tablet 1 Not Taking at Unknown time  . traZODone (DESYREL) 50 MG tablet Take 1 tablet (50 mg total) by mouth at bedtime and may  repeat dose one time if needed. For sleep (Patient not taking: Reported on 10/12/2016) 30 tablet 1 Not Taking at Unknown time    Treatment Modalities: Medication Management, Group therapy, Case management,  1 to 1 session with clinician, Psychoeducation, Recreational therapy.  Patient Stressors: Financial difficulties Marital or family conflict Medication change or noncompliance Substance abuse Patient Strengths: Ability for Engineer, manufacturing fund of knowledge Religious Affiliation  Physician Treatment Plan for Primary Diagnosis:  Substance induced mood disorder Long Term Goal(s): Improvement in symptoms so as ready for discharge Short Term Goals:    Medication Management: Evaluate patient's response, side effects, and tolerance of medication regimen.  Therapeutic Interventions: 1 to 1 sessions, Unit Group sessions and  Medication administration.  Evaluation of Outcomes: Not Met  Physician Treatment Plan for Secondary Diagnosis: Active Problems:   Substance induced mood disorder ([place])  Long Term Goal(s): Improvement in symptoms so as ready for discharge  Short Term Goals: Ability to identify changes in lifestyle to reduce recurrence of condition will improve Ability to verbalize feelings will improve Ability to disclose and discuss suicidal ideas Ability to demonstrate self-control will improve Ability to identify and develop effective coping behaviors will improve Ability to maintain clinical measurements within normal limits will improve Compliance with prescribed medications will improve Ability to identify triggers associated with substance abuse/mental health issues will improve Ability to identify changes in lifestyle to reduce recurrence of condition will improve Ability to verbalize feelings will improve Ability to disclose and discuss suicidal ideas Ability to demonstrate self-control will improve Ability to identify and develop effective coping behaviors will improve Ability to maintain clinical measurements within normal limits will improve Compliance with prescribed medications will improve Ability to identify triggers associated with substance abuse/mental health issues will improve  Medication Management: Evaluate patient's response, side effects, and tolerance of medication regimen.  Therapeutic Interventions: 1 to 1 sessions, Unit Group sessions and Medication administration.  Evaluation of Outcomes: Not Met  RN Treatment Plan for Primary Diagnosis:  Substance induced mood disorder Long Term Goal(s): Knowledge of disease and therapeutic regimen to maintain health will improve  Short Term Goals: Ability to remain free from injury will improve, Ability to verbalize feelings will improve and Compliance with prescribed medications will improve  Medication Management: RN will  administer medications as ordered by provider, will assess and evaluate patient's response and provide education to patient for prescribed medication. RN will report any adverse and/or side effects to prescribing provider.  Therapeutic Interventions: 1 on 1 counseling sessions, Psychoeducation, Medication administration, Evaluate responses to treatment, Monitor vital signs and CBGs as ordered, Perform/monitor CIWA, COWS, AIMS and Fall Risk screenings as ordered, Perform wound care treatments as ordered.  Evaluation of Outcomes: Not Met  LCSW Treatment Plan for Primary Diagnosis:  Substance induced mood disorder Long Term Goal(s): Safe transition to appropriate next level of care at discharge, Engage patient in therapeutic group addressing interpersonal concerns. Short Term Goals: Engage patient in aftercare planning with referrals and resources, Increase emotional regulation, Identify triggers associated with mental health/substance abuse issues and Increase skills for wellness and recovery  Therapeutic Interventions: Assess for all discharge needs, 1 to 1 time with Social worker, Explore available resources and support systems, Assess for adequacy in community support network, Educate family and significant other(s) on suicide prevention, Complete Psychosocial Assessment, Interpersonal group therapy.  Evaluation of Outcomes: Not Met  Progress in Treatment: Attending groups: Pt is new to milieu, continuing to assess  Participating in groups: Pt is new  to milieu, continuing to assess  Taking medication as prescribed: Yes, MD continues to assess for medication changes as needed Toleration medication: Yes, no side effects reported at this time Family/Significant other contact made: No, CSW assessing for appropriate contact Patient understands diagnosis: Continuing to assess Discussing patient identified problems/goals with staff: Yes Medical problems stabilized or resolved: Yes Denies  suicidal/homicidal ideation:  Issues/concerns per patient self-inventory: None Other: N/A  New problem(s) identified: None identified at this time.   New Short Term/Long Term Goal(s): None identified at this time.   Discharge Plan or Barriers:   Reason for Continuation of Hospitalization:  Depression Medication stabilization Suicidal ideation  Estimated Length of Stay: 3-5 days  Attendees: Patient: 10/13/2016 11:36 AM  Physician: Dr. Parke Poisson 10/13/2016 11:36 AM  Nursing: Vladimir Faster RN; [place], RN 10/13/2016 11:36 AM  RN Care Manager: Lars Pinks, RN 10/13/2016 11:36 AM  Social Worker: Maxie Better, LCSW; Matthew Saras, Latanya Presser 10/13/2016 11:36 AM  Recreational Therapist:  10/13/2016 11:36 AM  Other: Lindell Spar, NP; Samuel Jester, NP 10/13/2016 11:36 AM  Other:  10/13/2016 11:36 AM  Other: 10/13/2016 11:36 AM   Scribe for Treatment Team: Georga Kaufmann, MSW,LCSWA 10/13/2016 11:36 AM

## 2016-10-14 LAB — GLUCOSE, CAPILLARY
Glucose-Capillary: 169 mg/dL — ABNORMAL HIGH (ref 65–99)
Glucose-Capillary: 161 mg/dL — ABNORMAL HIGH (ref 65–99)
Glucose-Capillary: 143 mg/dL — ABNORMAL HIGH (ref 65–99)

## 2016-10-14 MED ORDER — TRAZODONE HCL 50 MG PO TABS
50.0000 mg | ORAL_TABLET | Freq: Every evening | ORAL | Status: DC | PRN
  Administered 2016-10-14 – 2016-10-19 (×6): 50 mg via ORAL
  Filled 2016-10-14: qty 7
  Filled 2016-10-14 (×6): qty 1

## 2016-10-14 NOTE — [facility] Group Notes (Signed)
[place] LCSW Group Therapy 10/14/2016 1:15pm  Type of Therapy: Group Therapy- Feelings Around Relapse and Recovery  Participation Level: Active   Participation Quality:  Appropriate  Affect:  Appropriate  Cognitive: Alert and Oriented   Insight:  Developing   Engagement in Therapy: Developing/Improving and Engaged   Modes of Intervention: Clarification, Confrontation, Discussion, Education, Exploration, Limit-setting, Orientation, Problem-solving, Rapport Building, Art therapist, Socialization and Support  Summary of Progress/Problems: The topic for today was feelings about relapse. The group discussed what relapse prevention is to them and identified triggers that they are on the path to relapse. Members also processed their feeling towards relapse and were able to relate to common experiences. Group also discussed coping skills that can be used for relapse prevention. Pt was alert and attentive for the duration of the group. Pt spoke about his struggles with THC use and was undecided on whether or not he was ready to stop. Pt did state that he would like to end his occasional cocaine use.  Therapeutic Modalities:   Cognitive Behavioral Therapy Solution-Focused Therapy Assertiveness Training Relapse Prevention Therapy   Georga Kaufmann, MSW, Latanya Presser [phone] 10/14/2016 3:38 PM

## 2016-10-14 NOTE — Progress Notes (Signed)
Patient did not attend AA group meeting. 

## 2016-10-14 NOTE — Progress Notes (Signed)
[facility] MD Progress Note  10/14/2016 11:38 AM Edgar White  MRN:  [id number] Subjective:  Patient is a 51 YO male with depression, suicidality Principal Problem: major depression, suicidality Diagnosis:   Patient Active Problem List   Diagnosis Date Noted  . Substance induced mood disorder ([place]) [F19.94] 10/13/2016  . Diabetes mellitus without complication ([place]) A999333 06/08/2016  . Anxiety state [F41.1]   . Polysubstance abuse [F19.10]   . Major depressive disorder, recurrent severe without psychotic features ([place]) [F33.2] 04/13/2016   Total Time spent with patient: 20 minutes  Past Psychiatric History: see H&P  Past Medical History:  Past Medical History:  Diagnosis Date  . Depression   . Diabetes mellitus without complication [facility])     Past Surgical History:  Procedure Laterality Date  . INGUINAL HERNIA REPAIR    . LUMBAR FUSION     Family History: History reviewed. No pertinent family history. Family Psychiatric  History: see H&P Social History:  History  Alcohol Use  . Yes    Comment: once a week     History  Drug Use  . Types: Marijuana, Cocaine    Comment: Uses daily. Last used: this morning    Social History   Social History  . Marital status: Single    Spouse name: N/A  . Number of children: N/A  . Years of education: N/A   Social History Main Topics  . Smoking status: Current Every Day Smoker    Packs/day: 0.50    Types: Cigarettes  . Smokeless tobacco: Never Used  . Alcohol use Yes     Comment: once a week  . Drug use: Yes    Types: Marijuana, Cocaine     Comment: Uses daily. Last used: this morning  . Sexual activity: No   Other Topics Concern  . None   Social History Narrative  . None   Additional Social History:    Pain Medications: None Prescriptions: currently taking Celexa and Trazadone prn Over the Counter: Folic acid, vitamins History of alcohol / drug use?: Yes Longest period of sobriety (when/how long): Not reported Negative  Consequences of Use: Financial Name of Substance 1: Marijuana 1 - Age of First Use: 17 years ago 1 - Amount (size/oz): 1-2 joints (shared) per day 1 - Frequency: Daily use 1 - Duration: Regular user since November 2016 1 - Last Use / Amount: 02/21 Name of Substance 2: Cocaine (powder) 2 - Age of First Use: 51 years of age 53 - Amount (size/oz): Varies 2 - Frequency: Twice in a week 2 - Duration: since I was 22 2 - Last Use / Amount: 02/20                Sleep: Good  Appetite:  Good  Current Medications: Current Facility-Administered Medications  Medication Dose Route Frequency Provider Last Rate Last Dose  . acetaminophen (TYLENOL) tablet 650 mg  650 mg Oral Q6H PRN Laverle Hobby, PA-C      . alum & mag hydroxide-simeth (MAALOX/MYLANTA) 200-200-20 MG/5ML suspension 30 mL  30 mL Oral Q4H PRN Laverle Hobby, PA-C   30 mL at 10/13/16 2047  . cloNIDine (CATAPRES) tablet 0.1 mg  0.1 mg Oral QID Laverle Hobby, PA-C   0.1 mg at 10/14/16 0831   Followed by  . [START ON 10/15/2016] cloNIDine (CATAPRES) tablet 0.1 mg  0.1 mg Oral BH-qamhs Spencer E Simon, PA-C       Followed by  . [START ON 10/17/2016] cloNIDine (CATAPRES) tablet  0.1 mg  0.1 mg Oral QAC breakfast Laverle Hobby, PA-C      . dicyclomine (BENTYL) tablet 20 mg  20 mg Oral Q6H PRN Laverle Hobby, PA-C      . hydrOXYzine (ATARAX/VISTARIL) tablet 25 mg  25 mg Oral Q6H PRN Laverle Hobby, PA-C      . insulin aspart (novoLOG) injection 0-20 Units  0-20 Units Subcutaneous TID WC Laverle Hobby, PA-C   4 Units at 10/14/16 [phone]  . loperamide (IMODIUM) capsule 2-4 mg  2-4 mg Oral PRN Laverle Hobby, PA-C      . magnesium hydroxide (MILK OF MAGNESIA) suspension 30 mL  30 mL Oral Daily PRN Laverle Hobby, PA-C      . metFORMIN (GLUCOPHAGE) tablet 500 mg  500 mg Oral BID WC Sharma Covert, MD   500 mg at 10/14/16 0829  . methocarbamol (ROBAXIN) tablet 500 mg  500 mg Oral Q8H PRN Laverle Hobby, PA-C      . naproxen  (NAPROSYN) tablet 500 mg  500 mg Oral BID PRN Laverle Hobby, PA-C      . nicotine (NICODERM CQ - dosed in mg/24 hours) patch 21 mg  21 mg Transdermal Daily Laverle Hobby, PA-C   21 mg at 10/14/16 [id number]  . ondansetron (ZOFRAN-ODT) disintegrating tablet 4 mg  4 mg Oral Q6H PRN Laverle Hobby, PA-C      . topiramate (TOPAMAX) tablet 25 mg  25 mg Oral BID Laverle Hobby, PA-C   25 mg at 10/14/16 0830  . traZODone (DESYREL) tablet 50 mg  50 mg Oral QHS PRN Sharma Covert, MD      . venlafaxine XR (EFFEXOR-XR) 24 hr capsule 75 mg  75 mg Oral Q breakfast Sharma Covert, MD   75 mg at 10/14/16 [id number]    Lab Results:  Results for orders placed or performed during the hospital encounter of 10/13/16 (from the past 48 hour(s))  Glucose, capillary     Status: Abnormal   Collection Time: 10/13/16  6:25 AM  Result Value Ref Range   Glucose-Capillary 171 (H) 65 - 99 mg/dL  Glucose, capillary     Status: Abnormal   Collection Time: 10/13/16 12:11 PM  Result Value Ref Range   Glucose-Capillary 158 (H) 65 - 99 mg/dL  Glucose, capillary     Status: Abnormal   Collection Time: 10/13/16  5:29 PM  Result Value Ref Range   Glucose-Capillary 214 (H) 65 - 99 mg/dL  TSH     Status: None   Collection Time: 10/13/16  6:20 PM  Result Value Ref Range   TSH 2.144 0.350 - 4.500 uIU/mL    Comment: Performed by a 3rd Generation assay with a functional sensitivity of <=0.01 uIU/mL. Performed at [facility], [place] [address] [place], [place] [address]   Glucose, capillary     Status: Abnormal   Collection Time: 10/14/16  6:08 AM  Result Value Ref Range   Glucose-Capillary 169 (H) 65 - 99 mg/dL   Comment 1 Notify RN     Blood Alcohol level:  Lab Results  Component Value Date   ETH <5 10/12/2016   ETH <5 123456    Metabolic Disorder Labs: Lab Results  Component Value Date   HGBA1C 6.5 (H) 04/15/2016   MPG 140 04/15/2016   No results found for: PROLACTIN Lab Results   Component Value Date   CHOL 207 (H) 04/15/2016   TRIG 174 (H)  04/15/2016   HDL 34 (L) 04/15/2016   CHOLHDL 6.1 04/15/2016   VLDL 35 04/15/2016   LDLCALC 138 (H) 04/15/2016   LDLCALC SEE COMMENT 02/09/2012    Physical Findings: AIMS: Facial and Oral Movements Muscles of Facial Expression: None, normal Lips and Perioral Area: None, normal Jaw: None, normal Tongue: None, normal,Extremity Movements Upper (arms, wrists, hands, fingers): None, normal Lower (legs, knees, ankles, toes): None, normal, Trunk Movements Neck, shoulders, hips: None, normal, Overall Severity Severity of abnormal movements (highest score from questions above): None, normal Incapacitation due to abnormal movements: None, normal Patient's awareness of abnormal movements (rate only patient's report): No Awareness, Dental Status Current problems with teeth and/or dentures?: No Does patient usually wear dentures?: No  CIWA:  CIWA-Ar Total: 1 COWS:  COWS Total Score: 0  Musculoskeletal: Strength & Muscle Tone: within normal limits Gait & Station: normal Patient leans: N/A  Psychiatric Specialty Exam: Physical Exam  Nursing note and vitals reviewed.   ROS feels better today physically and emotionally  Blood pressure 118/69, pulse 85, temperature 97.8 F (36.6 C), temperature source Oral, resp. rate 16, height 6' (1.829 m), weight 78 kg (172 lb), SpO2 98 %.Body mass index is 23.33 kg/m.  General Appearance: Casual  Eye Contact:  Good  Speech:  Clear and Coherent  Volume:  Normal  Mood:  Depressed but improved  Affect:  Appropriate  Thought Process:  Coherent  Orientation:  Full (Time, Place, and Person)  Thought Content:  Logical  Suicidal Thoughts:  No  Homicidal Thoughts:  No  Memory:  Immediate;   Good  Judgement:  Intact  Insight:  Lacking  Psychomotor Activity:  Normal  Concentration:  Concentration: Good  Recall:  Good  Fund of Knowledge:  Good  Language:  Good  Akathisia:  No  Handed:   Right  AIMS (if indicated):     Assets:  Communication Skills Desire for Improvement Resilience Social Support Vocational/Educational  ADL's:  Intact  Cognition:  WNL  Sleep:  Number of Hours: 6.75     Treatment Plan Summary: Daily contact with patient to assess and evaluate symptoms and progress in treatment, Medication management and Plan Patient is seen and examined. patient stated that he feels better today and is not feeling suicidal. He is requesting to increase the Effexor XR to 150 mg a day(because that is what he took in the past) but I told him that is really too early to increase it, and might lead to more SE. He still does not feel like substances are an issue. He is supported by his family after discharge, but he has burned bridges with many of his siblings who are frustrated by his situation. The patient is focused on his previous traumas including the death of his mother and father, being evicted x 2 and the lost of partner "who just disappeared one day".  Sharma Covert, MD 10/14/2016, 11:38 AM

## 2016-10-14 NOTE — Progress Notes (Signed)
D: Pt presents in a sad depressed and flat affect. States he slept 'to hard' after receiving Trazodone at [facility] last night. Requesting MD lower dose.Pt denies SI/HI/AVH and pain. Pleasant and cooperative with care.    A: Safety checks maintained. MD did lower Trazodone dose.   R: Pt able to contract for safety. Voiced no other complaints.

## 2016-10-14 NOTE — Progress Notes (Signed)
Recreation Therapy Notes  Date: 10/14/16 Time: 0930 Location: 300 Hall Dayroom  Group Topic: Stress Management  Goal Area(s) Addresses:  Patient will verbalize importance of using healthy stress management.  Patient will identify positive emotions associated with healthy stress management.   Intervention: Stress Management  Activity :  Focus Meditation.  LRT introduced the stress management technique of meditation.  LRT played a meditation on focus from the Calm app so patients could engage in the activity.  Patients were to follow along as the meditation was played to participate.  Education:  Stress Management, Discharge Planning.   Education Outcome: Acknowledges edcuation/In group clarification offered/Needs additional education  Clinical Observations/Feedback: Pt did not attend group.   Victorino Sparrow, LRT/CTRS         Victorino Sparrow A 10/14/2016 12:44 PM

## 2016-10-15 LAB — GLUCOSE, CAPILLARY
GLUCOSE-CAPILLARY: 148 mg/dL — AB (ref 65–99)
GLUCOSE-CAPILLARY: 178 mg/dL — AB (ref 65–99)
Glucose-Capillary: 122 mg/dL — ABNORMAL HIGH (ref 65–99)

## 2016-10-15 LAB — HEMOGLOBIN A1C
Hgb A1c MFr Bld: 8.3 % — ABNORMAL HIGH (ref 4.8–5.6)
Mean Plasma Glucose: 192 mg/dL

## 2016-10-15 NOTE — Progress Notes (Signed)
[facility] MD Progress Note  10/15/2016 1:47 PM Edgar White  MRN:  [id number]   Subjective:  Patient " I feeling okay, but this new medications make me sleep all the time."  Objective: Edgar White is awake, alert and oriented *3. Seen resting in bedroom.  Denies suicidal or homicidal ideation. Denies auditory or visual hallucination and does not appear to be responding to internal stimuli. Patient reports he is medication compliant, however reports he is unable to tolerate the Topamax or clonidine.   States his depression 5/10. Reports fair appetite and reports he is resting well. Support, encouragement and reassurance was provided.   Principal Problem: major depression, suicidally Diagnosis:   Patient Active Problem List   Diagnosis Date Noted  . Substance induced mood disorder ([place]) [F19.94] 10/13/2016  . Diabetes mellitus without complication ([place]) A999333 06/08/2016  . Anxiety state [F41.1]   . Polysubstance abuse [F19.10]   . Major depressive disorder, recurrent severe without psychotic features ([place]) [F33.2] 04/13/2016   Total Time spent with patient: 20 minutes  Past Psychiatric History: see H&P  Past Medical History:  Past Medical History:  Diagnosis Date  . Depression   . Diabetes mellitus without complication [facility])     Past Surgical History:  Procedure Laterality Date  . INGUINAL HERNIA REPAIR    . LUMBAR FUSION     Family History: History reviewed. No pertinent family history. Family Psychiatric  History: see H&P Social History:  History  Alcohol Use  . Yes    Comment: once a week     History  Drug Use  . Types: Marijuana, Cocaine    Comment: Uses daily. Last used: this morning    Social History   Social History  . Marital status: Single    Spouse name: N/A  . Number of children: N/A  . Years of education: N/A   Social History Main Topics  . Smoking status: Current Every Day Smoker    Packs/day: 0.50    Types: Cigarettes  . Smokeless tobacco: Never  Used  . Alcohol use Yes     Comment: once a week  . Drug use: Yes    Types: Marijuana, Cocaine     Comment: Uses daily. Last used: this morning  . Sexual activity: No   Other Topics Concern  . None   Social History Narrative  . None   Additional Social History:    Pain Medications: None Prescriptions: currently taking Celexa and Trazadone prn Over the Counter: Folic acid, vitamins History of alcohol / drug use?: Yes Longest period of sobriety (when/how long): Not reported Negative Consequences of Use: Financial Name of Substance 1: Marijuana 1 - Age of First Use: 17 years ago 1 - Amount (size/oz): 1-2 joints (shared) per day 1 - Frequency: Daily use 1 - Duration: Regular user since November 2016 1 - Last Use / Amount: 02/21 Name of Substance 2: Cocaine (powder) 2 - Age of First Use: 51 years of age 64 - Amount (size/oz): Varies 2 - Frequency: Twice in a week 2 - Duration: since I was 61 2 - Last Use / Amount: 02/20                Sleep: Fair  Appetite:  Good  Current Medications: Current Facility-Administered Medications  Medication Dose Route Frequency Provider Last Rate Last Dose  . acetaminophen (TYLENOL) tablet 650 mg  650 mg Oral Q6H PRN Laverle Hobby, PA-C      . alum & mag hydroxide-simeth (MAALOX/MYLANTA)  200-200-20 MG/5ML suspension 30 mL  30 mL Oral Q4H PRN Laverle Hobby, PA-C   30 mL at 10/13/16 2047  . cloNIDine (CATAPRES) tablet 0.1 mg  0.1 mg Oral BH-qamhs Laverle Hobby, PA-C       Followed by  . [START ON 10/17/2016] cloNIDine (CATAPRES) tablet 0.1 mg  0.1 mg Oral QAC breakfast Laverle Hobby, PA-C      . dicyclomine (BENTYL) tablet 20 mg  20 mg Oral Q6H PRN Laverle Hobby, PA-C      . hydrOXYzine (ATARAX/VISTARIL) tablet 25 mg  25 mg Oral Q6H PRN Laverle Hobby, PA-C      . insulin aspart (novoLOG) injection 0-20 Units  0-20 Units Subcutaneous TID WC Laverle Hobby, PA-C   3 Units at 10/15/16 1209  . loperamide (IMODIUM) capsule 2-4 mg   2-4 mg Oral PRN Laverle Hobby, PA-C      . magnesium hydroxide (MILK OF MAGNESIA) suspension 30 mL  30 mL Oral Daily PRN Laverle Hobby, PA-C      . metFORMIN (GLUCOPHAGE) tablet 500 mg  500 mg Oral BID WC Sharma Covert, MD   500 mg at 10/15/16 0957  . methocarbamol (ROBAXIN) tablet 500 mg  500 mg Oral Q8H PRN Laverle Hobby, PA-C      . naproxen (NAPROSYN) tablet 500 mg  500 mg Oral BID PRN Laverle Hobby, PA-C      . nicotine (NICODERM CQ - dosed in mg/24 hours) patch 21 mg  21 mg Transdermal Daily Laverle Hobby, PA-C   21 mg at 10/15/16 0957  . ondansetron (ZOFRAN-ODT) disintegrating tablet 4 mg  4 mg Oral Q6H PRN Laverle Hobby, PA-C      . topiramate (TOPAMAX) tablet 25 mg  25 mg Oral BID Laverle Hobby, PA-C   25 mg at 10/15/16 0957  . traZODone (DESYREL) tablet 50 mg  50 mg Oral QHS PRN Sharma Covert, MD   50 mg at 10/14/16 2141  . venlafaxine XR (EFFEXOR-XR) 24 hr capsule 75 mg  75 mg Oral Q breakfast Sharma Covert, MD   75 mg at 10/15/16 [id number]    Lab Results:  Results for orders placed or performed during the hospital encounter of 10/13/16 (from the past 48 hour(s))  Glucose, capillary     Status: Abnormal   Collection Time: 10/13/16  5:29 PM  Result Value Ref Range   Glucose-Capillary 214 (H) 65 - 99 mg/dL  Hemoglobin A1c     Status: Abnormal   Collection Time: 10/13/16  6:20 PM  Result Value Ref Range   Hgb A1c MFr Bld 8.3 (H) 4.8 - 5.6 %    Comment: (NOTE)         Pre-diabetes: 5.7 - 6.4         Diabetes: >6.4         Glycemic control for adults with diabetes: <7.0    Mean Plasma Glucose 192 mg/dL    Comment: (NOTE) Performed At: [facility] [place], [place] [id number] Lindon Romp MD [id number] Performed at [facility], [place] [address] [place], [place] [address]   TSH     Status: None   Collection Time: 10/13/16  6:20 PM  Result Value Ref Range   TSH 2.144 0.350 - 4.500 uIU/mL    Comment:  Performed by a 3rd Generation assay with a functional sensitivity of <=0.01 uIU/mL. Performed at [facility], [address] [place], [place] [address]   Glucose, capillary     Status: Abnormal   Collection Time: 10/14/16  6:08 AM  Result Value Ref Range   Glucose-Capillary 169 (H) 65 - 99 mg/dL   Comment 1 Notify RN   Glucose, capillary     Status: Abnormal   Collection Time: 10/14/16 12:15 PM  Result Value Ref Range   Glucose-Capillary 161 (H) 65 - 99 mg/dL  Glucose, capillary     Status: Abnormal   Collection Time: 10/14/16  5:07 PM  Result Value Ref Range   Glucose-Capillary 143 (H) 65 - 99 mg/dL  Glucose, capillary     Status: Abnormal   Collection Time: 10/15/16  6:07 AM  Result Value Ref Range   Glucose-Capillary 148 (H) 65 - 99 mg/dL   Comment 1 Notify RN   Glucose, capillary     Status: Abnormal   Collection Time: 10/15/16 11:57 AM  Result Value Ref Range   Glucose-Capillary 122 (H) 65 - 99 mg/dL    Blood Alcohol level:  Lab Results  Component Value Date   ETH <5 10/12/2016   ETH <5 123456    Metabolic Disorder Labs: Lab Results  Component Value Date   HGBA1C 8.3 (H) 10/13/2016   MPG 192 10/13/2016   MPG 140 04/15/2016   No results found for: PROLACTIN Lab Results  Component Value Date   CHOL 207 (H) 04/15/2016   TRIG 174 (H) 04/15/2016   HDL 34 (L) 04/15/2016   CHOLHDL 6.1 04/15/2016   VLDL 35 04/15/2016   LDLCALC 138 (H) 04/15/2016   LDLCALC SEE COMMENT 02/09/2012    Physical Findings: AIMS: Facial and Oral Movements Muscles of Facial Expression: None, normal Lips and Perioral Area: None, normal Jaw: None, normal Tongue: None, normal,Extremity Movements Upper (arms, wrists, hands, fingers): None, normal Lower (legs, knees, ankles, toes): None, normal, Trunk Movements Neck, shoulders, hips: None, normal, Overall Severity Severity of abnormal movements (highest score from questions above): None, normal Incapacitation due  to abnormal movements: None, normal Patient's awareness of abnormal movements (rate only patient's report): No Awareness, Dental Status Current problems with teeth and/or dentures?: No Does patient usually wear dentures?: No  CIWA:  CIWA-Ar Total: 1 COWS:  COWS Total Score: 0  Musculoskeletal: Strength & Muscle Tone: within normal limits Gait & Station: normal Patient leans: N/A  Psychiatric Specialty Exam: Physical Exam  Nursing note and vitals reviewed. Constitutional: He is oriented to person, place, and time. He appears well-developed and well-nourished.  Neurological: He is alert and oriented to person, place, and time.  Psychiatric: He has a normal mood and affect. His behavior is normal.    Review of Systems  Psychiatric/Behavioral: Positive for depression. The patient is nervous/anxious.    feels better today physically and emotionally  Blood pressure 99/62, pulse 73, temperature 97.9 F (36.6 C), resp. rate 20, height 6' (1.829 m), weight 78 kg (172 lb), SpO2 98 %.Body mass index is 23.33 kg/m.  General Appearance: Casual and Guarded  Eye Contact:  Good  Speech:  Clear and Coherent  Volume:  Normal  Mood:  Depressed and Dysphoric   Affect:  Appropriate  Thought Process:  Coherent  Orientation:  Full (Time, Place, and Person)  Thought Content:  Logical  Suicidal Thoughts:  No  Homicidal Thoughts:  No  Memory:  Immediate;   Good  Judgement:  Intact  Insight:  Lacking  Psychomotor Activity:  Normal  Concentration:  Concentration: Good  Recall:  [place]  of Knowledge:  Good  Language:  Good  Akathisia:  No  Handed:  Right  AIMS (if indicated):     Assets:  Communication Skills Desire for Improvement Resilience Social Support  ADL's:  Intact  Cognition:  WNL  Sleep:  Number of Hours: 6.75     I agree with current treatment plan on 10/15/2016, Patient seen face-to-face for psychiatric evaluation follow-up, chart reviewed and case discussed. Reviewed the  information documented and agree with the treatment plan.  Treatment Plan Summary: Daily contact with patient to assess and evaluate symptoms and progress in treatment and Medication management  Continue current treatment plan except with noted 10/15/2016  Continue with Topamax 25 mg and Effexor 75 mg for mood stabilization. Continue with Trazodone 50 mg for insomnia Started on CWIA/ Clonidine Protocol Will continue to monitor vitals ,medication compliance and treatment side effects while patient is here.  CSW will start working on disposition.  Patient to participate in therapeutic milieu  Derrill [place], NP 10/15/2016, 1:47 PM

## 2016-10-15 NOTE — Progress Notes (Signed)
Data. Patient denies SI/HI/AVH. Patient isolating in his room for most of shift. Affect is flat and does not brighten with interaction. Patient C/O feeling, "Lethargic. I think it is one of my medications." Patient did not take his clonidine due to the fatigue this am. On hs self assessment he reports 8/10 for depression, 7/10 for hopelessness and 5/10 for anxiety. His gaol for today is: "Staying awake. Feel better." Action. Emotional support and encouragement offered. Education provided on medication, indications and side effect. Q 15 minute checks done for safety. Response. Safety on the unit maintained through 15 minute checks.  Medications taken as prescribed. Attended groups. Remained calm and appropriate through out shift.

## 2016-10-15 NOTE — B[facility] Group Notes (Signed)
[place] LCSW Group Therapy Note  10/15/2016 at 10:00 AM  Type of Therapy and Topic:  Group Therapy: Avoiding Self-Sabotaging and Enabling Behaviors  Participation Level:  Minimal  Participation Quality:  Drowsy and Sharing  Affect:  Depressed  Cognitive:  Alert and Oriented  Insight:  Developing  Engagement in Therapy:  Limited   Therapeutic models used Cognitive Behavioral Therapy Person-Centered Therapy Motivational Interviewing   Summary of Patient Progress: The main focus of today's process group was to explain to the adolescent what "self-sabotage" means and use Motivational Interviewing to discuss what benefits, negative or positive, were involved in a self-identified self-sabotaging behavior. We then talked about reasons the patient may want to change the behavior and their current desire to change. Patient came in late yet did attempt to engage although he was drowsy and shared he was having difficulty with focus. Patient shared he has tendency to isolate and over think.   Sheilah Pigeon, LCSW

## 2016-10-15 NOTE — Progress Notes (Signed)
D.  Pt pleasant on approach, complaint of feeling drowsy all day.  Pt feels that clonidine is doing this, Pt refused this for that reason.  Pt was positive for evening AA group, interacting appropriately with peers on the unit.  Pt denies SI/HI/hallucinations at this time.  A.  Support and encouragement offered, medication given as ordered.  R.  Pt remains safe on the unit, will continue to monitor.

## 2016-10-15 NOTE — Progress Notes (Signed)
D: Pt at the time of assessment was alert and oriented x 4. Pt at the time denied any form of pain, anxiety, depression, SI, HI or AVH. Pt was however flat and isolated to his room; remained in bed for most of the evening; states, "I think the new medicine is making me a little drowsy." Pt remained calm and cooperative. A: Medications offered as prescribed.  All Pt's questions were answered. Support, encouragement, and safe environment provided.  15-minute safety checks continue. R: Pt was med compliant.  Pt attended [place] group. Safety checks continue.

## 2016-10-16 LAB — GLUCOSE, CAPILLARY
Glucose-Capillary: 150 mg/dL — ABNORMAL HIGH (ref 65–99)
Glucose-Capillary: 101 mg/dL — ABNORMAL HIGH (ref 65–99)
Glucose-Capillary: 139 mg/dL — ABNORMAL HIGH (ref 65–99)

## 2016-10-16 MED ORDER — AMOXICILLIN 500 MG PO CAPS
500.0000 mg | ORAL_CAPSULE | Freq: Three times a day (TID) | ORAL | Status: DC
  Administered 2016-10-16 – 2016-10-20 (×12): 500 mg via ORAL
  Filled 2016-10-16: qty 1
  Filled 2016-10-16: qty 9
  Filled 2016-10-16 (×10): qty 1
  Filled 2016-10-16 (×2): qty 9
  Filled 2016-10-16 (×2): qty 1
  Filled 2016-10-16: qty 9
  Filled 2016-10-16: qty 1
  Filled 2016-10-16 (×2): qty 9
  Filled 2016-10-16: qty 1

## 2016-10-16 MED ORDER — IBUPROFEN 800 MG PO TABS
800.0000 mg | ORAL_TABLET | Freq: Four times a day (QID) | ORAL | Status: DC | PRN
  Administered 2016-10-16 – 2016-10-17 (×2): 800 mg via ORAL
  Filled 2016-10-16 (×2): qty 1

## 2016-10-16 MED ORDER — BENZOCAINE 10 % MT GEL: Freq: Three times a day (TID) | OROMUCOSAL | Status: DC | PRN

## 2016-10-16 NOTE — Progress Notes (Addendum)
D   Pt was pleasant on approach  And cooperative   He refused clonidine and said he wasn't having any withdrawal symptoms    He said his tooth pain had decreased but his tooth was so loose he could almost pull it out    He does endorse some anxiety and depression   He said he was dissapointed because he had no visitors tonight A    Verbal support given    Medications administered and effectiveness monitored    Q 15 min checks R   Pt is safe at present time and receptive to verbal support

## 2016-10-16 NOTE — Progress Notes (Signed)
Patient ID: Edgar White, male   DOB: Nov 13, 1965, 51 y.o.   MRN: [id number]    D: Pt has been very flat and depressed on the unit today. He was also in a lot of pain due to a back molar. Pt has required medication all day to help with the pain. Tanika NP was made aware, new orders noted. Pt reported that after he took his antibiotic and pain medication he was able to bite down. Pt reported that his depression was a 6, his hopelessness was a 6, and his anxiety was a 6. Pt reported that his goal for today was to be more alert and active. Pt also reported that he felt sluggish and that his depression was not going away. Tanika NP made aware changes made to patients medication regimen. Pt reported being negative SI/HI, no AH/VH noted. A: 15 min checks continued for patient safety. R: Pt safety maintained.

## 2016-10-16 NOTE — [facility] Group Notes (Signed)
[name]:  (Nursing/MHT/Case Management/Adjunct)  Date:  10/16/2016  Time:  2:14 PM  Type of Therapy:  Psychoeducational Skills  Participation Level:  Did Not Attend  Participation Quality:  Did Not Attend  Affect:  Did Not Attend  Cognitive:  Did Not Attend  Insight:  None  Engagement in Group:  Did Not Attend  Modes of Intervention:  Did Not Attend  Summary of Progress/Problems: Pt did not attend patient self inventory group.    Edgar White 10/16/2016, 2:14 PM

## 2016-10-16 NOTE — Progress Notes (Signed)
[facility] MD Progress Note  10/16/2016 12:28 PM Edgar White  MRN:  [id number]   Subjective:  Patient " I am feeling better now that I am not taking Clonidine or Topamax" I am getting my appetite back.   Objective: Edgar White is awake, alert and oriented *3. Seen sitting in the dayroom interacting with peers. Patient has concerns with tooth pain. Report his tooth has been loose for the past 2 months. Denies suicidal or homicidal ideation. Denies auditory or visual hallucination and does not appear to be responding to internal stimuli. Patient reports he is medication compliant and tolerating medications well.  States his depression 4/10. Reports his  appetite is improving and reports he is resting well. Support, encouragement and reassurance was provided.   Principal Problem: major depression, suicidally Diagnosis:   Patient Active Problem List   Diagnosis Date Noted  . Substance induced mood disorder ([place]) [F19.94] 10/13/2016  . Diabetes mellitus without complication ([place]) [id number] 06/08/2016  . Anxiety state [F41.1]   . Polysubstance abuse [F19.10]   . Major depressive disorder, recurrent severe without psychotic features ([place]) [F33.2] 04/13/2016   Total Time spent with patient: 20 minutes  Past Psychiatric History: see H&P  Past Medical History:  Past Medical History:  Diagnosis Date  . Depression   . Diabetes mellitus without complication [facility])     Past Surgical History:  Procedure Laterality Date  . INGUINAL HERNIA REPAIR    . LUMBAR FUSION     Family History: History reviewed. No pertinent family history. Family Psychiatric  History: see H&P Social History:  History  Alcohol Use  . Yes    Comment: once a week     History  Drug Use  . Types: Marijuana, Cocaine    Comment: Uses daily. Last used: this morning    Social History   Social History  . Marital status: Single    Spouse name: N/A  . Number of children: N/A  . Years of education: N/A   Social History Main  Topics  . Smoking status: Current Every Day Smoker    Packs/day: 0.50    Types: Cigarettes  . Smokeless tobacco: Never Used  . Alcohol use Yes     Comment: once a week  . Drug use: Yes    Types: Marijuana, Cocaine     Comment: Uses daily. Last used: this morning  . Sexual activity: No   Other Topics Concern  . None   Social History Narrative  . None   Additional Social History:    Pain Medications: None Prescriptions: currently taking Celexa and Trazadone prn Over the Counter: Folic acid, vitamins History of alcohol / drug use?: Yes Longest period of sobriety (when/how long): Not reported Negative Consequences of Use: Financial Name of Substance 1: Marijuana 1 - Age of First Use: 17 years ago 1 - Amount (size/oz): 1-2 joints (shared) per day 1 - Frequency: Daily use 1 - Duration: Regular user since November 2016 1 - Last Use / Amount: 02/21 Name of Substance 2: Cocaine (powder) 2 - Age of First Use: 51 years of age 61 - Amount (size/oz): Varies 2 - Frequency: Twice in a week 2 - Duration: since I was 51 2 - Last Use / Amount: 02/20                Sleep: Fair  Appetite:  Good  Current Medications: Current Facility-Administered Medications  Medication Dose Route Frequency Provider Last Rate Last Dose  . acetaminophen (TYLENOL) tablet 650  mg  650 mg Oral Q6H PRN Laverle Hobby, PA-C   650 mg at 10/16/16 1142  . alum & mag hydroxide-simeth (MAALOX/MYLANTA) 200-200-20 MG/5ML suspension 30 mL  30 mL Oral Q4H PRN Laverle Hobby, PA-C   30 mL at 10/13/16 2047  . cloNIDine (CATAPRES) tablet 0.1 mg  0.1 mg Oral BH-qamhs Spencer E Simon, PA-C   0.1 mg at 10/16/16 [id number]   Followed by  . [START ON 10/17/2016] cloNIDine (CATAPRES) tablet 0.1 mg  0.1 mg Oral QAC breakfast Laverle Hobby, PA-C      . dicyclomine (BENTYL) tablet 20 mg  20 mg Oral Q6H PRN Laverle Hobby, PA-C      . hydrOXYzine (ATARAX/VISTARIL) tablet 25 mg  25 mg Oral Q6H PRN Laverle Hobby, PA-C      .  insulin aspart (novoLOG) injection 0-20 Units  0-20 Units Subcutaneous TID WC Laverle Hobby, PA-C   20 Units at 10/16/16 0606  . loperamide (IMODIUM) capsule 2-4 mg  2-4 mg Oral PRN Laverle Hobby, PA-C      . magnesium hydroxide (MILK OF MAGNESIA) suspension 30 mL  30 mL Oral Daily PRN Laverle Hobby, PA-C      . metFORMIN (GLUCOPHAGE) tablet 500 mg  500 mg Oral BID WC Sharma Covert, MD   500 mg at 10/16/16 [id number]  . methocarbamol (ROBAXIN) tablet 500 mg  500 mg Oral Q8H PRN Laverle Hobby, PA-C      . naproxen (NAPROSYN) tablet 500 mg  500 mg Oral BID PRN Laverle Hobby, PA-C   500 mg at 10/16/16 1142  . nicotine (NICODERM CQ - dosed in mg/24 hours) patch 21 mg  21 mg Transdermal Daily Laverle Hobby, PA-C   21 mg at 10/16/16 [id number]  . ondansetron (ZOFRAN-ODT) disintegrating tablet 4 mg  4 mg Oral Q6H PRN Laverle Hobby, PA-C      . topiramate (TOPAMAX) tablet 25 mg  25 mg Oral BID Laverle Hobby, PA-C   25 mg at 10/16/16 [id number]  . traZODone (DESYREL) tablet 50 mg  50 mg Oral QHS PRN Sharma Covert, MD   50 mg at 10/15/16 2103  . venlafaxine XR (EFFEXOR-XR) 24 hr capsule 75 mg  75 mg Oral Q breakfast Sharma Covert, MD   75 mg at 10/16/16 [id number]    Lab Results:  Results for orders placed or performed during the hospital encounter of 10/13/16 (from the past 48 hour(s))  Glucose, capillary     Status: Abnormal   Collection Time: 10/14/16  5:07 PM  Result Value Ref Range   Glucose-Capillary 143 (H) 65 - 99 mg/dL  Glucose, capillary     Status: Abnormal   Collection Time: 10/15/16  6:07 AM  Result Value Ref Range   Glucose-Capillary 148 (H) 65 - 99 mg/dL   Comment 1 Notify RN   Glucose, capillary     Status: Abnormal   Collection Time: 10/15/16 11:57 AM  Result Value Ref Range   Glucose-Capillary 122 (H) 65 - 99 mg/dL  Glucose, capillary     Status: Abnormal   Collection Time: 10/15/16  5:25 PM  Result Value Ref Range   Glucose-Capillary 178 (H) 65 - 99 mg/dL  Glucose,  capillary     Status: Abnormal   Collection Time: 10/16/16  5:59 AM  Result Value Ref Range   Glucose-Capillary 150 (H) 65 - 99 mg/dL  Glucose, capillary     Status: Abnormal  Collection Time: 10/16/16 11:38 AM  Result Value Ref Range   Glucose-Capillary 101 (H) 65 - 99 mg/dL    Blood Alcohol level:  Lab Results  Component Value Date   ETH <5 10/12/2016   ETH <5 [id number]    Metabolic Disorder Labs: Lab Results  Component Value Date   HGBA1C 8.3 (H) 10/13/2016   MPG 192 10/13/2016   MPG 140 04/15/2016   No results found for: PROLACTIN Lab Results  Component Value Date   CHOL 207 (H) 04/15/2016   TRIG 174 (H) 04/15/2016   HDL 34 (L) 04/15/2016   CHOLHDL 6.1 04/15/2016   VLDL 35 04/15/2016   LDLCALC 138 (H) 04/15/2016   LDLCALC SEE COMMENT 02/09/2012    Physical Findings: AIMS: Facial and Oral Movements Muscles of Facial Expression: None, normal Lips and Perioral Area: None, normal Jaw: None, normal Tongue: None, normal,Extremity Movements Upper (arms, wrists, hands, fingers): None, normal Lower (legs, knees, ankles, toes): None, normal, Trunk Movements Neck, shoulders, hips: None, normal, Overall Severity Severity of abnormal movements (highest score from questions above): None, normal Incapacitation due to abnormal movements: None, normal Patient's awareness of abnormal movements (rate only patient's report): No Awareness, Dental Status Current problems with teeth and/or dentures?: No Does patient usually wear dentures?: No  CIWA:  CIWA-Ar Total: 1 COWS:  COWS Total Score: 0  Musculoskeletal: Strength & Muscle Tone: within normal limits Gait & Station: normal Patient leans: N/A  Psychiatric Specialty Exam: Physical Exam  Nursing note and vitals reviewed. Constitutional: He is oriented to person, place, and time. He appears well-developed and well-nourished.  Neurological: He is alert and oriented to person, place, and time.  Psychiatric: He has a  normal mood and affect. His behavior is normal.    Review of Systems  Psychiatric/Behavioral: Positive for depression. The patient is nervous/anxious.    feels better today physically and emotionally  Blood pressure 129/68, pulse 79, temperature 98.2 F (36.8 C), resp. rate 18, height 6' (1.829 m), weight 78 kg (172 lb), SpO2 98 %.Body mass index is 23.33 kg/m.  General Appearance: Casual and Guarded c/o tooth pain  Eye Contact:  Good  Speech:  Clear and Coherent  Volume:  Normal  Mood:  Depressed and Dysphoric   Affect:  Appropriate  Thought Process:  Coherent  Orientation:  Full (Time, Place, and Person)  Thought Content:  Logical  Suicidal Thoughts:  No  Homicidal Thoughts:  No  Memory:  Immediate;   Good  Judgement:  Intact  Insight:  Lacking  Psychomotor Activity:  Normal  Concentration:  Concentration: Good  Recall:  Good  Fund of Knowledge:  Good  Language:  Good  Akathisia:  No  Handed:  Right  AIMS (if indicated):     Assets:  Communication Skills Desire for Improvement Resilience Social Support  ADL's:  Intact  Cognition:  WNL  Sleep:  Number of Hours: 6.75     I agree with current treatment plan on 10/16/2016, Patient seen face-to-face for psychiatric evaluation follow-up, chart reviewed and case discussed. Reviewed the information documented and agree with the treatment plan.  Treatment Plan Summary: Daily contact with patient to assess and evaluate symptoms and progress in treatment and Medication management  Continue current treatment plan except with noted 10/16/2016  Start amoxicillin 500 mg PO BID X 7days for toothache/ pain Discontinue  Topamax 25 mg  Continue  Effexor 75 mg for mood stabilization. Continue with Trazodone 50 mg for insomnia Started on CWIA/ Clonidine Protocol Will continue  to monitor vitals ,medication compliance and treatment side effects while patient is here.  CSW will start working on disposition.  Patient to participate in  therapeutic milieu  Derrill Center, NP 10/16/2016, 12:28 PM

## 2016-10-16 NOTE — [facility] Group Notes (Signed)
[place] LCSW Group Therapy  10/16/2016 10 AM  Type of Therapy:  Group Therapy with activity  Participation Level:  Active  Participation Quality:  Appropriate  Affect:  Appropriate  Cognitive:  Alert and Oriented  Insight:  Developing/Improving  Engagement in Therapy:  Engaged  Modes of Intervention:  Activity, Clarification, Exploration, Problem-solving, Socialization and Support  Summary of Progress/Problems: Topic for today was thoughts and feelings regarding discharge. We discussed fears of upcoming changes including judgements, expectations and stigma of mental health issues. We then discussed supports: what constitutes a supportive framework, identification of supports and what to do when others are not supportive. Activity was used to engage patients in visualizing the future based on today's choices. Patient chose a visual to represent decompensation as 'being stuck' and improvement as 'getting ready to succed.'   Sheilah Pigeon, LCSW

## 2016-10-17 DIAGNOSIS — F17210 Nicotine dependence, cigarettes, uncomplicated: Secondary | ICD-10-CM

## 2016-10-17 DIAGNOSIS — F332 Major depressive disorder, recurrent severe without psychotic features: Secondary | ICD-10-CM

## 2016-10-17 LAB — GLUCOSE, CAPILLARY
Glucose-Capillary: 120 mg/dL — ABNORMAL HIGH (ref 65–99)
Glucose-Capillary: 154 mg/dL — ABNORMAL HIGH (ref 65–99)
Glucose-Capillary: 125 mg/dL — ABNORMAL HIGH (ref 65–99)

## 2016-10-17 NOTE — Progress Notes (Signed)
Recreation Therapy Notes  Date: 10/17/16 Time: 0930 Location: [address] Dayroom  Group Topic: Stress Management  Goal Area(s) Addresses:  Patient will verbalize importance of using healthy stress management.  Patient will identify positive emotions associated with healthy stress management.   Intervention: Stress Management  Activity :  [employer]  LRT introduced the stress management technique of guided imagery.  LRT read a script to allow patients to engage in the activity.  Patients were to follow along as LRT read script to participate in activity.  Education:  Stress Management, Discharge Planning.   Education Outcome: Acknowledges edcuation/In group clarification offered/Needs additional education  Clinical Observations/Feedback: Pt did not attend group.    Victorino Sparrow, LRT/CTRS         Ria Comment, Tashiana Lamarca A 10/17/2016 1:23 PM

## 2016-10-17 NOTE — Plan of Care (Signed)
Problem: Safety: Goal: Periods of time without injury will increase Outcome: Progressing Patient has not engaged in self harm. Denies SI.  Problem: Medication: Goal: Compliance with prescribed medication regimen will improve Outcome: Progressing Patient is med compliant. (Refusing clonidine due to side effects, withdrawal minimal.)

## 2016-10-17 NOTE — Progress Notes (Signed)
D: Patient up and visible in the milieu. Spoke with patient 1:1. Rates sleep as good, appetite as fair, energy as low and concentration as poor. Patient's affect flat, sad, depressed and tearful with congruent mood. Anxious and upset after TC with family.  Rating depression, hopelessness and anxiety all at a 5/10. States goal for today is to "improve mood, figure out a plan, talk more and meet with social workers." Continues to complain of tooth pain of an 8/10. COWS is "2" due to anxiety.  A: Medicated per orders, advil prn given for pain and vistaril for anxiety. Emotional support offered and self inventory reviewed. Encouraged completion of Suicide Safety Plan. Discussed POC with MD, SW.   R: Patient verbalizes understanding of POC. On reassess, pain is a 6/10, anxiety slightly improved. Patient denies SI/HI and remains safe on level III obs.

## 2016-10-17 NOTE — Progress Notes (Signed)
[facility] MD Progress Note  10/17/2016 4:45 PM DEMONTEZ DEROUSSE  MRN:  [id number]   Subjective: Dewitt reports, "I'm waiting to see when the Effexor will kick in because, I'm still feeling depressed".  Objective: Jos is seen, chart reviewed. He is alert, oriented x 3 & aware of situations. He is visible on the unit. He is attending & participating in the group milieu. He continues to endorse depressive symptoms; sadness & hopeless. He says his mother died last 06-03-2023 right after his father passed. He states he took care of his mother until she passed & he has not been the same since. He states he was using drugs to try to feel better & has not been on his mental health medication for a while. Giro is instructed to continue to be patient to allow his medications to kick in. He currently denies any SIHI, AVH, delusional thoughts or paranoia. He denies any side effects from his medications.  Principal Problem: major depression, suicidally Diagnosis:   Patient Active Problem List   Diagnosis Date Noted  . Substance induced mood disorder ([place]) [F19.94] 10/13/2016  . Diabetes mellitus without complication ([place]) A999333 06/08/2016  . Anxiety state [F41.1]   . Polysubstance abuse [F19.10]   . Major depressive disorder, recurrent severe without psychotic features ([place]) [F33.2] 04/13/2016   Total Time spent with patient: 15 minutes  Past Psychiatric History: See H&P  Past Medical History:  Past Medical History:  Diagnosis Date  . Depression   . Diabetes mellitus without complication [facility])     Past Surgical History:  Procedure Laterality Date  . INGUINAL HERNIA REPAIR    . LUMBAR FUSION     Family History: History reviewed. No pertinent family history.  Family Psychiatric  History: See H&P  Social History:  History  Alcohol Use  . Yes    Comment: once a week     History  Drug Use  . Types: Marijuana, Cocaine    Comment: Uses daily. Last used: this morning    Social History   Social  History  . Marital status: Single    Spouse name: N/A  . Number of children: N/A  . Years of education: N/A   Social History Main Topics  . Smoking status: Current Every Day Smoker    Packs/day: 0.50    Types: Cigarettes  . Smokeless tobacco: Never Used  . Alcohol use Yes     Comment: once a week  . Drug use: Yes    Types: Marijuana, Cocaine     Comment: Uses daily. Last used: this morning  . Sexual activity: No   Other Topics Concern  . None   Social History Narrative  . None   Additional Social History:    Pain Medications: None Prescriptions: currently taking Celexa and Trazadone prn Over the Counter: Folic acid, vitamins History of alcohol / drug use?: Yes Longest period of sobriety (when/how long): Not reported Negative Consequences of Use: Financial Name of Substance 1: Marijuana 1 - Age of First Use: 17 years ago 1 - Amount (size/oz): 1-2 joints (shared) per day 1 - Frequency: Daily use 1 - Duration: Regular user since November 2016 1 - Last Use / Amount: 02/21 Name of Substance 2: Cocaine (powder) 2 - Age of First Use: 51 years of age 40 - Amount (size/oz): Varies 2 - Frequency: Twice in a week 2 - Duration: since I was 43 2 - Last Use / Amount: 02/20  Sleep: Fair  Appetite:  Good  Current  Medications: Current Facility-Administered Medications  Medication Dose Route Frequency Provider Last Rate Last Dose  . acetaminophen (TYLENOL) tablet 650 mg  650 mg Oral Q6H PRN Laverle Hobby, PA-C   650 mg at 10/16/16 1142  . alum & mag hydroxide-simeth (MAALOX/MYLANTA) 200-200-20 MG/5ML suspension 30 mL  30 mL Oral Q4H PRN Laverle Hobby, PA-C   30 mL at 10/13/16 2047  . amoxicillin (AMOXIL) capsule 500 mg  500 mg Oral Q8H Derrill Center, NP   500 mg at 10/17/16 1621  . benzocaine (ORAJEL) 10 % mucosal gel   Mouth/Throat TID PRN Derrill Center, NP      . cloNIDine (CATAPRES) tablet 0.1 mg  0.1 mg Oral QAC breakfast Laverle Hobby, PA-C      . dicyclomine  (BENTYL) tablet 20 mg  20 mg Oral Q6H PRN Laverle Hobby, PA-C      . hydrOXYzine (ATARAX/VISTARIL) tablet 25 mg  25 mg Oral Q6H PRN Laverle Hobby, PA-C   25 mg at 10/17/16 1205  . ibuprofen (ADVIL,MOTRIN) tablet 800 mg  800 mg Oral Q6H PRN Derrill Center, NP   800 mg at 10/17/16 0814  . insulin aspart (novoLOG) injection 0-20 Units  0-20 Units Subcutaneous TID WC Laverle Hobby, PA-C   4 Units at 10/17/16 1205  . loperamide (IMODIUM) capsule 2-4 mg  2-4 mg Oral PRN Laverle Hobby, PA-C      . magnesium hydroxide (MILK OF MAGNESIA) suspension 30 mL  30 mL Oral Daily PRN Laverle Hobby, PA-C      . metFORMIN (GLUCOPHAGE) tablet 500 mg  500 mg Oral BID WC Sharma Covert, MD   500 mg at 10/17/16 0815  . methocarbamol (ROBAXIN) tablet 500 mg  500 mg Oral Q8H PRN Laverle Hobby, PA-C      . nicotine (NICODERM CQ - dosed in mg/24 hours) patch 21 mg  21 mg Transdermal Daily Laverle Hobby, PA-C   21 mg at 10/17/16 0816  . ondansetron (ZOFRAN-ODT) disintegrating tablet 4 mg  4 mg Oral Q6H PRN Laverle Hobby, PA-C      . traZODone (DESYREL) tablet 50 mg  50 mg Oral QHS PRN Sharma Covert, MD   50 mg at 10/16/16 2146  . venlafaxine XR (EFFEXOR-XR) 24 hr capsule 75 mg  75 mg Oral Q breakfast Sharma Covert, MD   75 mg at 10/17/16 0815   Lab Results:  Results for orders placed or performed during the hospital encounter of 10/13/16 (from the past 48 hour(s))  Glucose, capillary     Status: Abnormal   Collection Time: 10/15/16  5:25 PM  Result Value Ref Range   Glucose-Capillary 178 (H) 65 - 99 mg/dL  Glucose, capillary     Status: Abnormal   Collection Time: 10/16/16  5:59 AM  Result Value Ref Range   Glucose-Capillary 150 (H) 65 - 99 mg/dL  Glucose, capillary     Status: Abnormal   Collection Time: 10/16/16 11:38 AM  Result Value Ref Range   Glucose-Capillary 101 (H) 65 - 99 mg/dL  Glucose, capillary     Status: Abnormal   Collection Time: 10/16/16  4:41 PM  Result Value Ref Range    Glucose-Capillary 139 (H) 65 - 99 mg/dL  Glucose, capillary     Status: Abnormal   Collection Time: 10/17/16  6:00 AM  Result Value Ref Range   Glucose-Capillary 120 (H) 65 - 99 mg/dL  Glucose, capillary  Status: Abnormal   Collection Time: 10/17/16 12:01 PM  Result Value Ref Range   Glucose-Capillary 154 (H) 65 - 99 mg/dL   Blood Alcohol level:  Lab Results  Component Value Date   ETH <5 10/12/2016   ETH <5 123456   Metabolic Disorder Labs: Lab Results  Component Value Date   HGBA1C 8.3 (H) 10/13/2016   MPG 192 10/13/2016   MPG 140 04/15/2016   No results found for: PROLACTIN Lab Results  Component Value Date   CHOL 207 (H) 04/15/2016   TRIG 174 (H) 04/15/2016   HDL 34 (L) 04/15/2016   CHOLHDL 6.1 04/15/2016   VLDL 35 04/15/2016   LDLCALC 138 (H) 04/15/2016   LDLCALC SEE COMMENT 02/09/2012    Physical Findings: AIMS: Facial and Oral Movements Muscles of Facial Expression: None, normal Lips and Perioral Area: None, normal Jaw: None, normal Tongue: None, normal,Extremity Movements Upper (arms, wrists, hands, fingers): None, normal Lower (legs, knees, ankles, toes): None, normal, Trunk Movements Neck, shoulders, hips: None, normal, Overall Severity Severity of abnormal movements (highest score from questions above): None, normal Incapacitation due to abnormal movements: None, normal Patient's awareness of abnormal movements (rate only patient's report): No Awareness, Dental Status Current problems with teeth and/or dentures?: No Does patient usually wear dentures?: No  CIWA:  CIWA-Ar Total: 1 COWS:  COWS Total Score: 2  Musculoskeletal: Strength & Muscle Tone: within normal limits Gait & Station: normal Patient leans: N/A  Psychiatric Specialty Exam: Physical Exam  Nursing note and vitals reviewed. Constitutional: He is oriented to person, place, and time. He appears well-developed and well-nourished.  Neurological: He is alert and oriented to  person, place, and time.  Psychiatric: He has a normal mood and affect. His behavior is normal.    Review of Systems  Psychiatric/Behavioral: Positive for depression and substance abuse (Hx. Polysubstance use disorder). Negative for hallucinations, memory loss and suicidal ideas. The patient is nervous/anxious and has insomnia.    feels better today physically and emotionally  Blood pressure 127/77, pulse 73, temperature 97.7 F (36.5 C), temperature source Oral, resp. rate 16, height 6' (1.829 m), weight 78 kg (172 lb), SpO2 98 %.Body mass index is 23.33 kg/m.  General Appearance: Casual   Eye Contact:  Good  Speech:  Clear and Coherent and Normal Rate  Volume:  Normal  Mood:  Depressed, Dysphoric and Hopeless   Affect:  Depressed and Flat  Thought Process:  Coherent and Goal Directed  Orientation:  Full (Time, Place, and Person)  Thought Content:  Rumination  Suicidal Thoughts:  Denies  Homicidal Thoughts:  Denies  Memory:  Immediate;   Good Recent;   Good Remote;   Good  Judgement:  Fair  Insight:  Present  Psychomotor Activity:  Normal  Concentration:  Concentration: Good and Attention Span: Good  Recall:  Good  Fund of Knowledge:  Fair  Language:  Good  Akathisia:  Negative  Handed:  Right  AIMS (if indicated):     Assets:  Communication Skills Desire for Improvement Resilience Social Support  ADL's:  Intact  Cognition:  WNL  Sleep:  Number of Hours: 6.75   Treatment Plan Summary: Daily contact with patient to assess and evaluate symptoms and progress in treatment and Medication management  Continue current treatment plan except with noted 10/17/2016  Will continue  amoxicillin 500 mg PO BID X 7days for toothache/ pain Will continue  Effexor XR 75 mg for depression. Will continue Trazodone 50 mg for insomnia Will continue on  CWIA/Clonidine Protocol for detoxification treatments. Will continue to monitor vitals,medication compliance and treatment side effects  while patient is here.  CSW will start working on disposition.  Patient to participate in therapeutic milieu  Encarnacion Slates, NP, PMHNP, FNP-BC. 10/17/2016, 4:45 PM Patient ID: [name], male   DOB: 1966-08-08, 51 y.o.   MRN: [id number]

## 2016-10-17 NOTE — BHH Group Notes (Signed)
[place] LCSW Group Therapy  10/17/2016 1:15pm  Type of Therapy: Group Therapy   Topic: Overcoming Obstacles  Participation Level: Active  Participation Quality: Appropriate   Affect: Appropriate  Cognitive: Appropriate and Oriented  Insight: Developing/Improving and Improving  Engagement in Therapy: Improving  Modes of Intervention: Discussion, Exploration, Problem-solving and Support  Description of Group:  In this group patients will be encouraged to explore what they see as obstacles to their own wellness and recovery. They will be guided to discuss their thoughts, feelings, and behaviors related to these obstacles. The group will process together ways to cope with barriers, with attention given to specific choices patients can make. Each patient will be challenged to identify changes they are motivated to make in order to overcome their obstacles. This group will be process-oriented, with patients participating in exploration of their own experiences as well as giving and receiving support and challenge from other group members.  Summary of Patient Progress: Pt states that his main obstacle is himself. Pt reports that he would like to turn his life around but he does not know where to start. Pt became tearful during group after hearing a peer share her story. When asked why he got emotional, pt stated that he resonated with the other group member's story. Pt reports feelings alone and that no one in his family understands him.  Therapeutic Modalities:  Cognitive Behavioral Therapy Solution Focused Therapy Motivational Interviewing Relapse Prevention Therapy  Georga Kaufmann, MSW, LCSWA

## 2016-10-17 NOTE — BHH Group Notes (Signed)
CSW met with pt individually after group. CSW called pt's sister (Karen [phone]) with pt present to discuss pt's discharge plan. At this point it is uncertain where pt will stay after discharge as his family members are not willing to take him in. Pt's sister requested a family session and CSW agreed. CSW will discuss with MD tomorrow an appropriate time for family session and will inform pt's sister. In the meantime, CSW has discussed the option of a shelter with the pt.    B , MSW, LCSWA [phone]  

## 2016-10-17 NOTE — Progress Notes (Signed)
The patient attended the evening A.A.meeting and was appropriate.  

## 2016-10-18 LAB — GLUCOSE, CAPILLARY
GLUCOSE-CAPILLARY: 127 mg/dL — AB (ref 65–99)
Glucose-Capillary: 134 mg/dL — ABNORMAL HIGH (ref 65–99)
Glucose-Capillary: 104 mg/dL — ABNORMAL HIGH (ref 65–99)
Glucose-Capillary: 159 mg/dL — ABNORMAL HIGH (ref 65–99)

## 2016-10-18 MED ORDER — METHOCARBAMOL 500 MG PO TABS: 500.0000 mg | ORAL_TABLET | Freq: Three times a day (TID) | ORAL | Status: DC | PRN

## 2016-10-18 MED ORDER — CLONIDINE HCL 0.1 MG PO TABS
0.1000 mg | ORAL_TABLET | Freq: Every day | ORAL | Status: DC
  Filled 2016-10-18 (×3): qty 1

## 2016-10-18 MED ORDER — LOPERAMIDE HCL 2 MG PO CAPS: 2.0000 mg | ORAL_CAPSULE | ORAL | Status: DC | PRN

## 2016-10-18 MED ORDER — DICYCLOMINE HCL 20 MG PO TABS: 20.0000 mg | ORAL_TABLET | Freq: Four times a day (QID) | ORAL | Status: DC | PRN

## 2016-10-18 MED ORDER — HYDROXYZINE HCL 25 MG PO TABS
25.0000 mg | ORAL_TABLET | Freq: Four times a day (QID) | ORAL | Status: DC | PRN
  Filled 2016-10-18: qty 10

## 2016-10-18 MED ORDER — ONDANSETRON 4 MG PO TBDP: 4.0000 mg | ORAL_TABLET | Freq: Four times a day (QID) | ORAL | Status: DC | PRN

## 2016-10-18 MED ORDER — VENLAFAXINE HCL ER 150 MG PO CP24
150.0000 mg | ORAL_CAPSULE | Freq: Every day | ORAL | Status: DC
  Administered 2016-10-19 – 2016-10-20 (×2): 150 mg via ORAL
  Filled 2016-10-18 (×4): qty 1
  Filled 2016-10-18 (×2): qty 7

## 2016-10-18 NOTE — Progress Notes (Signed)
[facility] MD Progress Note  10/18/2016 4:11 PM Edgar White  MRN:  [id number]   Subjective:  51 yo Caucasian male, single, homeless but has been living with his brother. Presented to the ER with thoughts of suicide. Had an argument with his brother over the phone. Felt down and rejected after the conversation. Started ruminating about suicide. Started ruminating on recent loss of his mom. Expressed regrets fro dropping a case he filed against his family for fraud. UDS was positive for cocaine,THC and benzodiazepines.  Nursing staff reports that he has been engaging with groups. He has bene pleasant on the unit. He has not expressed any thoughts of suicide. He has not expressed any thoughts of violence. He is tolerating his medications well.   Seen today. States that he is feeling better each day. He is not as depressed as he was. He is no longer having thoughts of suicide. Disappointed with his brother. Says he has bene kicked out while he was here. Patient says he has no where to go to. Says he has a friend in [place] and another in [place]. He plans to seek their help. Patient says he is sleeping well again. His appetite is back to normal. He is optimistic about the future. We explored role of substances. Patient minimizes the effects. He is not motivated for change at this time.   Principal Problem: Substance Induced Mood Disorder Diagnosis:   Patient Active Problem List   Diagnosis Date Noted  . Substance induced mood disorder ([place]) [F19.94] 10/13/2016  . Diabetes mellitus without complication ([place]) A999333 06/08/2016  . Anxiety state [F41.1]   . Polysubstance abuse [F19.10]   . Major depressive disorder, recurrent severe without psychotic features ([place]) [F33.2] 04/13/2016   Total Time spent with patient: 30 minutes  Past Psychiatric History: See H&P  Past Medical History:  Past Medical History:  Diagnosis Date  . Depression   . Diabetes mellitus without complication [facility])     Past  Surgical History:  Procedure Laterality Date  . INGUINAL HERNIA REPAIR    . LUMBAR FUSION     Family History: History reviewed. No pertinent family history.  Family Psychiatric  History: See H&P  Social History:  History  Alcohol Use  . Yes    Comment: once a week     History  Drug Use  . Types: Marijuana, Cocaine    Comment: Uses daily. Last used: this morning    Social History   Social History  . Marital status: Single    Spouse name: N/A  . Number of children: N/A  . Years of education: N/A   Social History Main Topics  . Smoking status: Current Every Day Smoker    Packs/day: 0.50    Types: Cigarettes  . Smokeless tobacco: Never Used  . Alcohol use Yes     Comment: once a week  . Drug use: Yes    Types: Marijuana, Cocaine     Comment: Uses daily. Last used: this morning  . Sexual activity: No   Other Topics Concern  . None   Social History Narrative  . None   Additional Social History:    Pain Medications: None Prescriptions: currently taking Celexa and Trazadone prn Over the Counter: Folic acid, vitamins History of alcohol / drug use?: Yes Longest period of sobriety (when/how long): Not reported Negative Consequences of Use: Financial Name of Substance 1: Marijuana 1 - Age of First Use: 17 years ago 1 - Amount (size/oz): 1-2 joints (shared) per  day 1 - Frequency: Daily use 1 - Duration: Regular user since November 2016 1 - Last Use / Amount: 02/21 Name of Substance 2: Cocaine (powder) 2 - Age of First Use: 51 years of age 37 - Amount (size/oz): Varies 2 - Frequency: Twice in a week 2 - Duration: since I was 9 2 - Last Use / Amount: 02/20  Sleep: good  Appetite:  Good  Current Medications: Current Facility-Administered Medications  Medication Dose Route Frequency Provider Last Rate Last Dose  . acetaminophen (TYLENOL) tablet 650 mg  650 mg Oral Q6H PRN Laverle Hobby, PA-C   650 mg at 10/16/16 1142  . alum & mag hydroxide-simeth  (MAALOX/MYLANTA) 200-200-20 MG/5ML suspension 30 mL  30 mL Oral Q4H PRN Laverle Hobby, PA-C   30 mL at 10/13/16 2047  . amoxicillin (AMOXIL) capsule 500 mg  500 mg Oral Q8H Derrill Center, NP   500 mg at 10/18/16 1448  . benzocaine (ORAJEL) 10 % mucosal gel   Mouth/Throat TID PRN Derrill Center, NP      . cloNIDine (CATAPRES) tablet 0.1 mg  0.1 mg Oral QAC breakfast Laverle Hobby, PA-C      . ibuprofen (ADVIL,MOTRIN) tablet 800 mg  800 mg Oral Q6H PRN Derrill Center, NP   800 mg at 10/17/16 0814  . insulin aspart (novoLOG) injection 0-20 Units  0-20 Units Subcutaneous TID WC Laverle Hobby, PA-C   Stopped at 10/18/16 1200  . magnesium hydroxide (MILK OF MAGNESIA) suspension 30 mL  30 mL Oral Daily PRN Laverle Hobby, PA-C      . metFORMIN (GLUCOPHAGE) tablet 500 mg  500 mg Oral BID WC Sharma Covert, MD   500 mg at 10/18/16 0802  . nicotine (NICODERM CQ - dosed in mg/24 hours) patch 21 mg  21 mg Transdermal Daily Laverle Hobby, PA-C   21 mg at 10/18/16 0802  . traZODone (DESYREL) tablet 50 mg  50 mg Oral QHS PRN Sharma Covert, MD   50 mg at 10/17/16 2142  . venlafaxine XR (EFFEXOR-XR) 24 hr capsule 75 mg  75 mg Oral Q breakfast Sharma Covert, MD   75 mg at 10/18/16 0802   Lab Results:  Results for orders placed or performed during the hospital encounter of 10/13/16 (from the past 48 hour(s))  Glucose, capillary     Status: Abnormal   Collection Time: 10/16/16  4:41 PM  Result Value Ref Range   Glucose-Capillary 139 (H) 65 - 99 mg/dL  Glucose, capillary     Status: Abnormal   Collection Time: 10/17/16  6:00 AM  Result Value Ref Range   Glucose-Capillary 120 (H) 65 - 99 mg/dL  Glucose, capillary     Status: Abnormal   Collection Time: 10/17/16 12:01 PM  Result Value Ref Range   Glucose-Capillary 154 (H) 65 - 99 mg/dL  Glucose, capillary     Status: Abnormal   Collection Time: 10/17/16  5:29 PM  Result Value Ref Range   Glucose-Capillary 125 (H) 65 - 99 mg/dL  Glucose,  capillary     Status: Abnormal   Collection Time: 10/18/16  5:53 AM  Result Value Ref Range   Glucose-Capillary 134 (H) 65 - 99 mg/dL  Glucose, capillary     Status: Abnormal   Collection Time: 10/18/16 12:03 PM  Result Value Ref Range   Glucose-Capillary 104 (H) 65 - 99 mg/dL   Comment 1 Notify RN    Blood Alcohol level:  Lab Results  Component Value Date   ETH <5 10/12/2016   ETH <5 123456   Metabolic Disorder Labs: Lab Results  Component Value Date   HGBA1C 8.3 (H) 10/13/2016   MPG 192 10/13/2016   MPG 140 04/15/2016   No results found for: PROLACTIN Lab Results  Component Value Date   CHOL 207 (H) 04/15/2016   TRIG 174 (H) 04/15/2016   HDL 34 (L) 04/15/2016   CHOLHDL 6.1 04/15/2016   VLDL 35 04/15/2016   LDLCALC 138 (H) 04/15/2016   LDLCALC SEE COMMENT 02/09/2012    Physical Findings: AIMS: Facial and Oral Movements Muscles of Facial Expression: None, normal Lips and Perioral Area: None, normal Jaw: None, normal Tongue: None, normal,Extremity Movements Upper (arms, wrists, hands, fingers): None, normal Lower (legs, knees, ankles, toes): None, normal, Trunk Movements Neck, shoulders, hips: None, normal, Overall Severity Severity of abnormal movements (highest score from questions above): None, normal Incapacitation due to abnormal movements: None, normal Patient's awareness of abnormal movements (rate only patient's report): No Awareness, Dental Status Current problems with teeth and/or dentures?: No Does patient usually wear dentures?: No  CIWA:  CIWA-Ar Total: 1 COWS:  COWS Total Score: 2  Musculoskeletal: Strength & Muscle Tone: within normal limits Gait & Station: normal Patient leans: N/A  Psychiatric Specialty Exam: Physical Exam  Nursing note and vitals reviewed. Constitutional: He is oriented to person, place, and time. He appears well-developed and well-nourished.  Neurological: He is alert and oriented to person, place, and time.   Psychiatric: He has a normal mood and affect. His behavior is normal.    Review of Systems  Constitutional: Negative.   HENT: Negative.   Eyes: Negative.   Respiratory: Negative.   Cardiovascular: Negative.   Gastrointestinal: Negative.   Genitourinary: Negative.   Musculoskeletal: Negative.   Skin: Negative.   Neurological: Negative.   Endo/Heme/Allergies: Negative.   Psychiatric/Behavioral: Substance abuse: Hx. Polysubstance use disorder.       As above   feels better today physically and emotionally  Blood pressure 118/79, pulse 72, temperature 97.8 F (36.6 C), temperature source Oral, resp. rate 18, height 6' (1.829 m), weight 78 kg (172 lb), SpO2 98 %.Body mass index is 23.33 kg/m.  General Appearance: Neatly dressed, pleasant, engaging well and cooperative. Appropriate behavior. Not in any distress. Good relatedness. Not internally stimulated   Eye Contact:  Good  Speech:  Spontaneous, normal prosody. Normal tone and rate.   Volume:  Normal  Mood:  Euthymic   Affect:  Full range and appropriate  Thought Process:  Linear and logical  Orientation:  Full (Time, Place, and Person)  Thought Content:  No delusional theme. No preoccupation with violent thoughts. No negative ruminations. No obsession.  No hallucination in any modality.   Suicidal Thoughts:  No  Homicidal Thoughts:  No  Memory:  Immediate;   Good Recent;   Good Remote;   Good  Judgement:  Good  Insight:  Partial  Psychomotor Activity:  Normal  Concentration:  Concentration: Good and Attention Span: Good  Recall:  Good  Fund of Knowledge:  Good  Language:  Good  Akathisia:  No  Handed:  Right  AIMS (if indicated):     Assets:  Communication Skills Desire for Improvement Resilience Social Support  ADL's:  Intact  Cognition:  WNL  Sleep:  Number of Hours: 6.25   Treatment Plan Summary: Depression in context of multiple psychosocial stressors. Patient is responding well to Venlafaxine. He has has  consented to further  titration today. He is hopeful with respect to the future. He is not a danger to himself or others.    Psychiatric: MDD Recurrent SUD Substance Induced Mood Disorder  Medical: DM  Psychosocial:  Homelessness Bereavement  Recent end of a relationship.    PLAN: 1. Increase Venlafaxine to 150 mg daily 2. Encourage unit groups and activities 3. Monitor mood, behavior and interaction with peers 4. Motivational enhancement  5. Family meeting tomorrow. Hopeful discharge as soon as we have placement   Reva Pinkley A Richie Vadala

## 2016-10-18 NOTE — BHH Group Notes (Addendum)
Patient invited to nursing education group however elected to remain in bed.

## 2016-10-18 NOTE — Progress Notes (Signed)
Nursing Progress Note 7p-7a  D) Patient presents pleasant and cooperative but appears sad. Patient reports having "an emotional day". Patient states "it's my family. I wish you could fix them". Patient denies SI/HI/AVH or pain. Patient reported "the antibiotics are really helping my tooth". Patient contracts for safety on the unit. Patient did attend group and is seen interactive in the milieu.  A) Emotional support given. 1:1 interaction and active listening provided. Patient medicated with PM orders as prescribed. Medications reviewed with patient. Patient verbalized understanding of medications without further questions.  Snacks and fluids provided. Opportunities for questions or concerns presented to patient. Patient encouraged to continue to work on treatment goals. Labs, vital signs and patient behavior monitored throughout shift. Patient safety maintained with q15 min safety checks.  R) Patient receptive to interaction with nurse. Patient remains safe on the unit at this time. Patient denies any adverse medication reactions at this time. Patient is resting in bed without complaints. Will continue to monitor.

## 2016-10-18 NOTE — Progress Notes (Signed)
D: Patient up and visible in the milieu. Spoke with patient 1:1. Rates sleep as good, appetite as fair, energy as normal and concentration as good. Patient's affect animated, anxious with congruent mood. Patient observed to be restless on unit. Does state the effexor is helping and he is feeling less depressed. Rating depression, hopelessness and anxiety all at a 3/10. States goal for today is to "go to groups, work on plan, meet with Education officer, museum and make calls." Denies pain and states antibiotics are helping his tooth. No other physical problems.   A: Medicated per orders, no prns given or requested. Emotional support offered and self inventory reviewed. Encouraged completion of Suicide Safety Plan. Discussed POC with MD, SW.    R: Patient verbalizes understanding of POC. Patient denies SI/HI and remains safe on level III obs.

## 2016-10-18 NOTE — Plan of Care (Signed)
Problem: Coping: Goal: Ability to verbalize frustrations and anger appropriately will improve Outcome: Progressing Patient able to appropriately verbalize frustrations towards his family to writer this evening. Patient states "it needs to get better. I wish we could support each other".

## 2016-10-18 NOTE — Progress Notes (Signed)
Chaplain providing spiritual support 1:1 with Edgar White at his request.  Edgar White approached chaplain following grief group and requested additional support.  Chaplain made schedule with Edgar White to see tomorrow AM, which he was amenable to.   Met with Edgar White in group room of 300 hall.  Edgar White expressed that he afternoon was difficult yesterday, hearing from his sister that Edgar White's brother is "finished with him."   Edgar White is anxious about discharge plans and family meeting tomorrow.  Expressed anger with family related to feeling unsupported.  Described caring for mother over a period of two years - during which his father died.  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  Feels his siblings do not acknowledge his role as sole care provider for his mother.  Edgar White described not having days off of caring until he would leave and siblings would have to take over.  Also described siblings stating "this is your job."   Became involved with siblings n court case around estate after parent's death.    Edgar White states he wishes to reconcile and that "things could go back to the way they were."  He also longs for acknowledgement from his siblings.    Edgar White notices that his anger keeps him from being able to be present and think about what he may be able to change.  In conversation, he would move back to fixating on anger when thinking about discharge plans.  Chaplain and Edgar White spoke about what he desired from his siblings, and how he might cope if they are not able to give him the care / acknowledgement that he seeks.  Speaks briefly about finding other communities of support.   Edgar White had started attending a church in [place] and he wishes to continue this.  Stated that no one at the church knows that he has been hospitalized.    Engaged in some planning around approaching family session.   Edgar White requested prayers that his siblings would be able to reconcile.      [place], [place]

## 2016-10-18 NOTE — Progress Notes (Signed)
CSW will meet with pt, MD, and pt's family tomorrow at 11am for a family session. CSW called pt's sister Santiago Glad [phone]) and notified her of family session. Pt's sister is agreeable and will notify the other family members.   Edgar White, MSW, [place]

## 2016-10-18 NOTE — B[facility] Group Notes (Signed)
[place] LCSW Group Therapy  10/18/2016 3:04 PM  Type of Therapy:  Group Therapy  Participation Level:  Active  Participation Quality:  Attentive  Affect:  Depressed  Cognitive:  Oriented  Insight:  Limited  Engagement in Therapy:  Improving   Modes of Intervention:  Discussion, Education, Exploration, Socialization and Support  Summary of Progress/Problems: MHA Speaker came to talk about his personal journey with substance abuse and addiction. The pt processed ways by which to relate to the speaker. [place] speaker provided handouts and educational information pertaining to groups and services offered by the [facility].   Lenni Reckner N Smart LCSW 10/18/2016, 3:04 PM

## 2016-10-19 LAB — GLUCOSE, CAPILLARY
GLUCOSE-CAPILLARY: 161 mg/dL — AB (ref 65–99)
GLUCOSE-CAPILLARY: 153 mg/dL — AB (ref 65–99)
GLUCOSE-CAPILLARY: 196 mg/dL — AB (ref 65–99)

## 2016-10-19 NOTE — Progress Notes (Cosign Needed)
Recreation Therapy Notes  Date: 10/19/16 Time: 0930 Location: 300 Hall Group Room  Group Topic: Stress Management  Goal Area(s) Addresses:  Patient will verbalize importance of using healthy stress management.  Patient will identify positive emotions associated with healthy stress management.   Intervention: Stress Management  Activity :  Meditation.  LRT played a meditation from the [place] so patients could practice the technique of meditation.  Patients were to follow along as the meditation was played to engage in the activity.  Education:  Stress Management, Discharge Planning.   Education Outcome: Acknowledges edcuation/In group clarification offered/Needs additional education  Clinical Observations/Feedback: Pt did not attend group.    Victorino Sparrow, LRT/CTRS         Victorino Sparrow A 10/19/2016 12:41 PM

## 2016-10-19 NOTE — Progress Notes (Signed)
Pt attended AA group this evening.  

## 2016-10-19 NOTE — Progress Notes (Signed)
Pt was in his room in bed at the beginning of the shift and reported that he was having some nausea at that time.  Pt was given ginger ale and told to inform staff if it got worse.  Pt got up after group time was over and said he was feeling better.  He denies SI/HI/AVH.  He is worried about his family session scheduled for tomorrow and hopes they can resolve some family conflict.  Support and encouragement offered.  Discharge plans are in process.  Safety maintained with q15 minute checks.

## 2016-10-19 NOTE — Progress Notes (Signed)
[facility] MD Progress Note  10/19/2016 5:01 PM BREYON GRIESHABER  MRN:  [id number]   Subjective:  51 yo Caucasian male, single, homeless but has been living with his brother. Presented to the ER with thoughts of suicide. Had an argument with his brother over the phone. Felt down and rejected after the conversation. Started ruminating about suicide. Started ruminating on recent loss of his mom. Expressed regrets fro dropping a case he filed against his family for fraud. UDS was positive for cocaine,THC and benzodiazepines.  Seen today. Had an argument with her sister yesterday. She called off the family meeting. Patient has made up with his brother. Says his brother has agreed to house him. He is pleased with this development. He has been in good spirits. Normal interest and activities. Good energy levels. He is looking forward to discharge. No futility thoughts.  Nursing staff reports that patient has been appropriate on the unit. Patient has been interacting well with peers. No behavioral issues. Patient has not voiced any suicidal thoughts. Patient has not been observed to be internally stimulated. Patient has been adherent with treatment recommendations. Patient has been tolerating their medication well.   SW has confirmed that his brother would pick him up tomorrow.   Principal Problem: Substance Induced Mood Disorder Diagnosis:   Patient Active Problem List   Diagnosis Date Noted  . Substance induced mood disorder ([place]) [F19.94] 10/13/2016  . Diabetes mellitus without complication ([place]) [id number] 06/08/2016  . Anxiety state [F41.1]   . Polysubstance abuse [F19.10]   . Major depressive disorder, recurrent severe without psychotic features ([place]) [F33.2] 04/13/2016   Total Time spent with patient: 30 minutes  Past Psychiatric History: See H&P  Past Medical History:  Past Medical History:  Diagnosis Date  . Depression   . Diabetes mellitus without complication [facility])     Past Surgical History:   Procedure Laterality Date  . INGUINAL HERNIA REPAIR    . LUMBAR FUSION     Family History: History reviewed. No pertinent family history.  Family Psychiatric  History: See H&P  Social History:  History  Alcohol Use  . Yes    Comment: once a week     History  Drug Use  . Types: Marijuana, Cocaine    Comment: Uses daily. Last used: this morning    Social History   Social History  . Marital status: Single    Spouse name: N/A  . Number of children: N/A  . Years of education: N/A   Social History Main Topics  . Smoking status: Current Every Day Smoker    Packs/day: 0.50    Types: Cigarettes  . Smokeless tobacco: Never Used  . Alcohol use Yes     Comment: once a week  . Drug use: Yes    Types: Marijuana, Cocaine     Comment: Uses daily. Last used: this morning  . Sexual activity: No   Other Topics Concern  . None   Social History Narrative  . None   Additional Social History:    Pain Medications: None Prescriptions: currently taking Celexa and Trazadone prn Over the Counter: Folic acid, vitamins History of alcohol / drug use?: Yes Longest period of sobriety (when/how long): Not reported Negative Consequences of Use: Financial Name of Substance 1: Marijuana 1 - Age of First Use: 17 years ago 1 - Amount (size/oz): 1-2 joints (shared) per day 1 - Frequency: Daily use 1 - Duration: Regular user since November 2016 1 - Last Use / Amount: 02/21  Name of Substance 2: Cocaine (powder) 2 - Age of First Use: 51 years of age 14 - Amount (size/oz): Varies 2 - Frequency: Twice in a week 2 - Duration: since I was 15 2 - Last Use / Amount: 02/20  Sleep: good  Appetite:  Good  Current Medications: Current Facility-Administered Medications  Medication Dose Route Frequency Provider Last Rate Last Dose  . acetaminophen (TYLENOL) tablet 650 mg  650 mg Oral Q6H PRN Laverle Hobby, PA-C   650 mg at 10/16/16 1142  . alum & mag hydroxide-simeth (MAALOX/MYLANTA) 200-200-20  MG/5ML suspension 30 mL  30 mL Oral Q4H PRN Laverle Hobby, PA-C   30 mL at 10/13/16 2047  . amoxicillin (AMOXIL) capsule 500 mg  500 mg Oral Q8H Derrill Center, NP   500 mg at 10/19/16 1442  . benzocaine (ORAJEL) 10 % mucosal gel   Mouth/Throat TID PRN Derrill Center, NP      . cloNIDine (CATAPRES) tablet 0.1 mg  0.1 mg Oral QAC breakfast Artist Beach, MD      . dicyclomine (BENTYL) tablet 20 mg  20 mg Oral Q6H PRN Artist Beach, MD      . hydrOXYzine (ATARAX/VISTARIL) tablet 25 mg  25 mg Oral Q6H PRN Artist Beach, MD      . ibuprofen (ADVIL,MOTRIN) tablet 800 mg  800 mg Oral Q6H PRN Derrill Center, NP   800 mg at 10/17/16 0814  . insulin aspart (novoLOG) injection 0-20 Units  0-20 Units Subcutaneous TID WC Laverle Hobby, PA-C   4 Units at 10/19/16 1210  . loperamide (IMODIUM) capsule 2-4 mg  2-4 mg Oral PRN Artist Beach, MD      . magnesium hydroxide (MILK OF MAGNESIA) suspension 30 mL  30 mL Oral Daily PRN Laverle Hobby, PA-C      . metFORMIN (GLUCOPHAGE) tablet 500 mg  500 mg Oral BID WC Sharma Covert, MD   500 mg at 10/19/16 1642  . methocarbamol (ROBAXIN) tablet 500 mg  500 mg Oral Q8H PRN Artist Beach, MD      . nicotine (NICODERM CQ - dosed in mg/24 hours) patch 21 mg  21 mg Transdermal Daily Laverle Hobby, PA-C   21 mg at 10/19/16 0848  . ondansetron (ZOFRAN-ODT) disintegrating tablet 4 mg  4 mg Oral Q6H PRN Artist Beach, MD      . traZODone (DESYREL) tablet 50 mg  50 mg Oral QHS PRN Sharma Covert, MD   50 mg at 10/18/16 2114  . venlafaxine XR (EFFEXOR-XR) 24 hr capsule 150 mg  150 mg Oral Q breakfast Artist Beach, MD   150 mg at 10/19/16 [id number]   Lab Results:  Results for orders placed or performed during the hospital encounter of 10/13/16 (from the past 48 hour(s))  Glucose, capillary     Status: Abnormal   Collection Time: 10/17/16  5:29 PM  Result Value Ref Range   Glucose-Capillary 125 (H) 65 - 99 mg/dL  Glucose, capillary      Status: Abnormal   Collection Time: 10/18/16  5:53 AM  Result Value Ref Range   Glucose-Capillary 134 (H) 65 - 99 mg/dL  Glucose, capillary     Status: Abnormal   Collection Time: 10/18/16 12:03 PM  Result Value Ref Range   Glucose-Capillary 104 (H) 65 - 99 mg/dL   Comment 1 Notify RN   Glucose, capillary     Status: Abnormal   Collection  Time: 10/18/16  4:58 PM  Result Value Ref Range   Glucose-Capillary 127 (H) 65 - 99 mg/dL   Comment 1 Notify RN   Glucose, capillary     Status: Abnormal   Collection Time: 10/18/16  8:54 PM  Result Value Ref Range   Glucose-Capillary 159 (H) 65 - 99 mg/dL   Comment 1 Notify RN   Glucose, capillary     Status: Abnormal   Collection Time: 10/19/16  5:42 AM  Result Value Ref Range   Glucose-Capillary 161 (H) 65 - 99 mg/dL   Comment 1 Notify RN   Glucose, capillary     Status: Abnormal   Collection Time: 10/19/16 12:08 PM  Result Value Ref Range   Glucose-Capillary 153 (H) 65 - 99 mg/dL   Blood Alcohol level:  Lab Results  Component Value Date   ETH <5 10/12/2016   ETH <5 123456   Metabolic Disorder Labs: Lab Results  Component Value Date   HGBA1C 8.3 (H) 10/13/2016   MPG 192 10/13/2016   MPG 140 04/15/2016   No results found for: PROLACTIN Lab Results  Component Value Date   CHOL 207 (H) 04/15/2016   TRIG 174 (H) 04/15/2016   HDL 34 (L) 04/15/2016   CHOLHDL 6.1 04/15/2016   VLDL 35 04/15/2016   LDLCALC 138 (H) 04/15/2016   LDLCALC SEE COMMENT 02/09/2012    Physical Findings: AIMS: Facial and Oral Movements Muscles of Facial Expression: None, normal Lips and Perioral Area: None, normal Jaw: None, normal Tongue: None, normal,Extremity Movements Upper (arms, wrists, hands, fingers): None, normal Lower (legs, knees, ankles, toes): None, normal, Trunk Movements Neck, shoulders, hips: None, normal, Overall Severity Severity of abnormal movements (highest score from questions above): None, normal Incapacitation due to  abnormal movements: None, normal Patient's awareness of abnormal movements (rate only patient's report): No Awareness, Dental Status Current problems with teeth and/or dentures?: No Does patient usually wear dentures?: No  CIWA:  CIWA-Ar Total: 1 COWS:  COWS Total Score: 2  Musculoskeletal: Strength & Muscle Tone: within normal limits Gait & Station: normal Patient leans: N/A  Psychiatric Specialty Exam: Physical Exam  Nursing note and vitals reviewed. Constitutional: He is oriented to person, place, and time. He appears well-developed and well-nourished.  Neurological: He is alert and oriented to person, place, and time.  Psychiatric: He has a normal mood and affect. His behavior is normal.    Review of Systems  Constitutional: Negative.   HENT: Negative.   Eyes: Negative.   Respiratory: Negative.   Cardiovascular: Negative.   Gastrointestinal: Negative.   Genitourinary: Negative.   Musculoskeletal: Negative.   Skin: Negative.   Neurological: Negative.   Endo/Heme/Allergies: Negative.   Psychiatric/Behavioral: Substance abuse: Hx. Polysubstance use disorder.       As above   feels better today physically and emotionally  Blood pressure (!) 145/90, pulse 80, temperature 97.9 F (36.6 C), temperature source Oral, resp. rate 16, height 6' (1.829 m), weight 78 kg (172 lb), SpO2 98 %.Body mass index is 23.33 kg/m.  General Appearance: Neatly dressed, pleasant, engaging well and cooperative. Appropriate behavior. Not in any distress. Good relatedness. Not internally stimulated   Eye Contact:  Good  Speech:  Spontaneous, normal prosody. Normal tone and rate.   Volume:  Normal  Mood:  Euthymic   Affect:  Full range and appropriate  Thought Process:  Linear and logical  Orientation:  Full (Time, Place, and Person)  Thought Content:  No delusional theme. No preoccupation with violent thoughts.  No negative ruminations. No obsession.  No hallucination in any modality.   Suicidal  Thoughts:  No  Homicidal Thoughts:  No  Memory:  Immediate;   Good Recent;   Good Remote;   Good  Judgement:  Good  Insight:  Partial  Psychomotor Activity:  Normal  Concentration:  Concentration: Good and Attention Span: Good  Recall:  Good  Fund of Knowledge:  Good  Language:  Good  Akathisia:  No  Handed:  Right  AIMS (if indicated):     Assets:  Communication Skills Desire for Improvement Resilience Social Support  ADL's:  Intact  Cognition:  WNL  Sleep:  Number of Hours: 6.25   Treatment Plan Summary: Depression is lifting he is tolerating recent medication adjustment well. No dangerousness.   Psychiatric: MDD Recurrent SUD Substance Induced Mood Disorder  Medical: DM  Psychosocial:  Homelessness Bereavement  Recent end of a relationship.    PLAN: 1. Continue current regimen 2. For discharge tomorrow   [place] Patient ID: KYRILLOS KALAFUT, male   DOB: 10-13-1965, 51 y.o.   MRN: [id number]

## 2016-10-19 NOTE — Progress Notes (Signed)
D: Pt received in a calm pleasant mood. Cooperative with care. Took all meds without incident Denies SI/HI/AVH. Pt's goal is to prepare for discharge.  A: Praised pt for participating with groups. Safety checks maintained.  R: Pt responded to praise well. Verbalized no complaints.

## 2016-10-19 NOTE — Progress Notes (Signed)
Psychoeducational Group Note  Date:  10/19/2016 Time:  2406  Group Topic/Focus:  Wrap-Up Group:   The focus of this group is to help patients review their daily goal of treatment and discuss progress on daily workbooks.  Participation Level: Did Not Attend  Participation Quality:  Not Applicable  Affect:  Not Applicable  Cognitive:  Not Applicable  Insight:  Not Applicable  Engagement in Group: Not Applicable  Additional Comments:  The patient did not attend group this evening as he remained in his bed.   Fintan Grater S 10/19/2016, 12:06 AM

## 2016-10-19 NOTE — BHH Group Notes (Signed)
[place] LCSW Group Therapy 10/19/2016 1:15pm  Type of Therapy: Group Therapy- Feelings Around Relapse and Recovery  Participation Level: Active   Participation Quality:  Appropriate  Affect:  Appropriate  Cognitive: Alert and Oriented   Insight:  Developing   Engagement in Therapy: Developing/Improving and Engaged   Modes of Intervention: Clarification, Confrontation, Discussion, Education, Exploration, Limit-setting, Orientation, Problem-solving, Rapport Building, Art therapist, Socialization and Support  Summary of Progress/Problems: The topic for today was feelings about relapse. The group discussed what relapse prevention is to them and identified triggers that they are on the path to relapse. Members also processed their feeling towards relapse and were able to relate to common experiences. Group also discussed coping skills that can be used for relapse prevention. Pt was alert and engaged for the duration of the group. Pt states that he would like closure with his siblings and he is having a hard time moving on from the hurt.    Therapeutic Modalities:   Cognitive Behavioral Therapy Solution-Focused Therapy Assertiveness Training Relapse Prevention Therapy   Georga Kaufmann, MSW, [place]anya Presser [phone]  10/19/2016 3:33 PM

## 2016-10-19 NOTE — Tx Team (Signed)
Interdisciplinary Treatment and Diagnostic Plan Update 10/19/2016 Time of Session: 9:30am  Edgar White  MRN: [id number]  Principal Diagnosis:  Substance induced mood disorder  Secondary Diagnoses: Active Problems:   Substance induced mood disorder (HCC)   Current Medications:  Current Facility-Administered Medications  Medication Dose Route Frequency Provider Last Rate Last Dose  . acetaminophen (TYLENOL) tablet 650 mg  650 mg Oral Q6H PRN Laverle Hobby, PA-C   650 mg at 10/16/16 1142  . alum & mag hydroxide-simeth (MAALOX/MYLANTA) 200-200-20 MG/5ML suspension 30 mL  30 mL Oral Q4H PRN Laverle Hobby, PA-C   30 mL at 10/13/16 2047  . amoxicillin (AMOXIL) capsule 500 mg  500 mg Oral Q8H Derrill Center, NP   500 mg at 10/19/16 0630  . benzocaine (ORAJEL) 10 % mucosal gel   Mouth/Throat TID PRN Derrill Center, NP      . cloNIDine (CATAPRES) tablet 0.1 mg  0.1 mg Oral QAC breakfast Artist Beach, MD      . dicyclomine (BENTYL) tablet 20 mg  20 mg Oral Q6H PRN Artist Beach, MD      . hydrOXYzine (ATARAX/VISTARIL) tablet 25 mg  25 mg Oral Q6H PRN Artist Beach, MD      . ibuprofen (ADVIL,MOTRIN) tablet 800 mg  800 mg Oral Q6H PRN Derrill Center, NP   800 mg at 10/17/16 0814  . insulin aspart (novoLOG) injection 0-20 Units  0-20 Units Subcutaneous TID WC Laverle Hobby, PA-C   4 Units at 10/19/16 [phone]  . loperamide (IMODIUM) capsule 2-4 mg  2-4 mg Oral PRN Artist Beach, MD      . magnesium hydroxide (MILK OF MAGNESIA) suspension 30 mL  30 mL Oral Daily PRN Laverle Hobby, PA-C      . metFORMIN (GLUCOPHAGE) tablet 500 mg  500 mg Oral BID WC Sharma Covert, MD   500 mg at 10/19/16 0810  . methocarbamol (ROBAXIN) tablet 500 mg  500 mg Oral Q8H PRN Artist Beach, MD      . nicotine (NICODERM CQ - dosed in mg/24 hours) patch 21 mg  21 mg Transdermal Daily Laverle Hobby, PA-C   21 mg at 10/18/16 0802  . ondansetron (ZOFRAN-ODT) disintegrating tablet 4 mg  4 mg  Oral Q6H PRN Artist Beach, MD      . traZODone (DESYREL) tablet 50 mg  50 mg Oral QHS PRN Sharma Covert, MD   50 mg at 10/18/16 2114  . venlafaxine XR (EFFEXOR-XR) 24 hr capsule 150 mg  150 mg Oral Q breakfast Artist Beach, MD   150 mg at 10/19/16 0810    PTA Medications: Prescriptions Prior to Admission  Medication Sig Dispense Refill Last Dose  . citalopram (CELEXA) 40 MG tablet Take 1 tablet (40 mg total) by mouth daily. For depression 30 tablet 1 10/12/2016 at Unknown time  . hydrOXYzine (ATARAX/VISTARIL) 25 MG tablet Take 1 tablet (25 mg total) by mouth every 6 (six) hours as needed for anxiety. (Patient not taking: Reported on 10/12/2016) 60 tablet 1 Not Taking at Unknown time  . metFORMIN (GLUCOPHAGE) 500 MG tablet Take 1 tablet (500 mg total) by mouth 2 (two) times daily with a meal. (Patient not taking: Reported on 10/12/2016) 60 tablet 2 Not Taking at Unknown time  . OLANZapine (ZYPREXA) 2.5 MG tablet Take 1 tablet (2.5 mg total) by mouth at bedtime. For mood control/agitation (Patient not taking: Reported on 10/12/2016) 30 tablet 1  Not Taking at Unknown time  . traZODone (DESYREL) 50 MG tablet Take 1 tablet (50 mg total) by mouth at bedtime and may repeat dose one time if needed. For sleep (Patient not taking: Reported on 10/12/2016) 30 tablet 1 Not Taking at Unknown time    Treatment Modalities: Medication Management, Group therapy, Case management,  1 to 1 session with clinician, Psychoeducation, Recreational therapy.  Patient Stressors: Financial difficulties Marital or family conflict Medication change or noncompliance Substance abuse Patient Strengths: Ability for Engineer, manufacturing fund of knowledge Religious Affiliation  Physician Treatment Plan for Primary Diagnosis:  Substance induced mood disorder Long Term Goal(s): Improvement in symptoms so as ready for discharge Short Term Goals: Ability to identify changes in lifestyle to reduce  recurrence of condition will improve Ability to verbalize feelings will improve Ability to disclose and discuss suicidal ideas Ability to demonstrate self-control will improve Ability to identify and develop effective coping behaviors will improve Ability to maintain clinical measurements within normal limits will improve Compliance with prescribed medications will improve Ability to identify triggers associated with substance abuse/mental health issues will improve Ability to identify changes in lifestyle to reduce recurrence of condition will improve Ability to verbalize feelings will improve Ability to disclose and discuss suicidal ideas Ability to demonstrate self-control will improve Ability to identify and develop effective coping behaviors will improve Ability to maintain clinical measurements within normal limits will improve Compliance with prescribed medications will improve Ability to identify triggers associated with substance abuse/mental health issues will improve  Medication Management: Evaluate patient's response, side effects, and tolerance of medication regimen.  Therapeutic Interventions: 1 to 1 sessions, Unit Group sessions and Medication administration.  Evaluation of Outcomes: Progressing  Physician Treatment Plan for Secondary Diagnosis: Active Problems:   Substance induced mood disorder ([place])  Long Term Goal(s): Improvement in symptoms so as ready for discharge  Short Term Goals: Ability to identify changes in lifestyle to reduce recurrence of condition will improve Ability to verbalize feelings will improve Ability to disclose and discuss suicidal ideas Ability to demonstrate self-control will improve Ability to identify and develop effective coping behaviors will improve Ability to maintain clinical measurements within normal limits will improve Compliance with prescribed medications will improve Ability to identify triggers associated with substance  abuse/mental health issues will improve Ability to identify changes in lifestyle to reduce recurrence of condition will improve Ability to verbalize feelings will improve Ability to disclose and discuss suicidal ideas Ability to demonstrate self-control will improve Ability to identify and develop effective coping behaviors will improve Ability to maintain clinical measurements within normal limits will improve Compliance with prescribed medications will improve Ability to identify triggers associated with substance abuse/mental health issues will improve  Medication Management: Evaluate patient's response, side effects, and tolerance of medication regimen.  Therapeutic Interventions: 1 to 1 sessions, Unit Group sessions and Medication administration.  Evaluation of Outcomes: Progressing  RN Treatment Plan for Primary Diagnosis:  Substance induced mood disorder Long Term Goal(s): Knowledge of disease and therapeutic regimen to maintain health will improve  Short Term Goals: Ability to remain free from injury will improve, Ability to verbalize feelings will improve and Compliance with prescribed medications will improve  Medication Management: RN will administer medications as ordered by provider, will assess and evaluate patient's response and provide education to patient for prescribed medication. RN will report any adverse and/or side effects to prescribing provider.  Therapeutic Interventions: 1 on 1 counseling sessions, Psychoeducation, Medication administration, Evaluate responses to treatment, Monitor  vital signs and CBGs as ordered, Perform/monitor CIWA, COWS, AIMS and Fall Risk screenings as ordered, Perform wound care treatments as ordered.  Evaluation of Outcomes: Progressing  LCSW Treatment Plan for Primary Diagnosis:  Substance induced mood disorder Long Term Goal(s): Safe transition to appropriate next level of care at discharge, Engage patient in therapeutic group addressing  interpersonal concerns. Short Term Goals: Engage patient in aftercare planning with referrals and resources, Increase emotional regulation, Identify triggers associated with mental health/substance abuse issues and Increase skills for wellness and recovery  Therapeutic Interventions: Assess for all discharge needs, 1 to 1 time with Social worker, Explore available resources and support systems, Assess for adequacy in community support network, Educate family and significant other(s) on suicide prevention, Complete Psychosocial Assessment, Interpersonal group therapy.  Evaluation of Outcomes: Progressing  Progress in Treatment: Attending groups: Yes Participating in groups: Yes Taking medication as prescribed: Yes, MD continues to assess for medication changes as needed Toleration medication: Yes, no side effects reported at this time Family/Significant other contact made: Yes, pt's sister contacted. Patient understands diagnosis: Developing insight. Discussing patient identified problems/goals with staff: Yes Medical problems stabilized or resolved: Yes Denies suicidal/homicidal ideation:  Issues/concerns per patient self-inventory: None Other: N/A  New problem(s) identified: None identified at this time.   New Short Term/Long Term Goal(s): None identified at this time.   Discharge Plan or Barriers: Pt will follow up outpatient with [facility] in [place].  Reason for Continuation of Hospitalization:  Depression Medication stabilization Suicidal ideation  Estimated Length of Stay: 2-4 days  Attendees: Patient: 10/19/2016 8:47 AM  Physician: Dr. Sanjuana Letters 10/19/2016 8:47 AM  Nursing: Jinny Sanders, RN; Clarene Critchley, RN 10/19/2016 8:47 AM  RN Care Manager: Lars Pinks, RN 10/19/2016 8:47 AM  Social Worker: Maxie Better, LCSW; Matthew Saras, Latanya Presser 10/19/2016 8:47 AM  Recreational Therapist:  10/19/2016 8:47 AM  Other: Lindell Spar, NP; Samuel Jester, NP 10/19/2016 8:47 AM  Other:  10/19/2016 8:47 AM   Other: 10/19/2016 8:47 AM   Scribe for Treatment Team: Georga Kaufmann, MSW,LCSWA 10/19/2016 8:47 AM

## 2016-10-19 NOTE — Progress Notes (Signed)
CSW received a call from pt's sister Santiago Glad [phone]). Santiago Glad states that after talking with the pt last night on the phone that she does not see the need for a family session anymore. CSW informed MD. CSW met with pt to discuss pt's options for discharge. Pt states that he would not like help from his family because he is still hurt about their behavior after the death of his mother. CSW expressed her understanding and stated that pt would likely have to discharge to a shelter. Pt would like to get to [place] to get his dog from his brother. CSW requested that pt contact his brother and see if his brother would be willing to pick pt up from the hospital. Pt agreed. CSW will check in with pt again later today.  Georga Kaufmann, MSW, [place]

## 2016-10-20 LAB — GLUCOSE, CAPILLARY
GLUCOSE-CAPILLARY: 127 mg/dL — AB (ref 65–99)
GLUCOSE-CAPILLARY: 127 mg/dL — AB (ref 65–99)

## 2016-10-20 MED ORDER — AMOXICILLIN 500 MG PO CAPS: 500.0000 mg | ORAL_CAPSULE | Freq: Three times a day (TID) | ORAL | 0 refills | Status: DC

## 2016-10-20 MED ORDER — BENZOCAINE 10 % MT GEL: Freq: Three times a day (TID) | OROMUCOSAL | 0 refills | Status: DC | PRN

## 2016-10-20 MED ORDER — TRAZODONE HCL 50 MG PO TABS: 50.0000 mg | ORAL_TABLET | Freq: Every evening | ORAL | 0 refills | Status: DC | PRN

## 2016-10-20 MED ORDER — METFORMIN HCL 500 MG PO TABS: 500.0000 mg | ORAL_TABLET | Freq: Two times a day (BID) | ORAL | 0 refills | Status: DC

## 2016-10-20 MED ORDER — VENLAFAXINE HCL ER 150 MG PO CP24: 150.0000 mg | ORAL_CAPSULE | Freq: Every day | ORAL | 0 refills | Status: DC

## 2016-10-20 MED ORDER — HYDROXYZINE HCL 25 MG PO TABS: 25.0000 mg | ORAL_TABLET | Freq: Four times a day (QID) | ORAL | 0 refills | Status: DC | PRN

## 2016-10-20 MED ORDER — NICOTINE 21 MG/24HR TD PT24: 21.0000 mg | MEDICATED_PATCH | Freq: Every day | TRANSDERMAL | 0 refills | Status: DC

## 2016-10-20 NOTE — Progress Notes (Signed)
  [facility] Adult Case Management Discharge Plan :  Will you be returning to the same living situation after discharge:  No. Pt will be going to live with his brother. At discharge, do you have transportation home?: Yes,  bus pass provided. Do you have the ability to pay for your medications: Yes,  prescriptions provided.  Release of information consent forms completed and in the chart;  Patient's signature needed at discharge.  Patient to Follow up at: [facility].   Why:  Please go for a walk-in appointment within 1-3 days of discharge to be established for outpatient services.Walk-in hours are 8a-3p Mon-Fri. Please arrive as early as possible to be sure you are seen. Contact information: [place] [address] [phone]           Next level of care provider has access to [name] and Suicide Prevention discussed: Yes,  with pt and with pt's sister.  Have you used any form of tobacco in the last 30 days? (Cigarettes, Smokeless Tobacco, Cigars, and/or Pipes): Yes  Has patient been referred to the Quitline?: Patient refused referral  Patient has been referred for addiction treatment: Yes  Edgar White 10/20/2016, 11:00 AM

## 2016-10-20 NOTE — Progress Notes (Signed)
Edgar White was pleasant and cooperative in the milieu today. He denied SI, HI, and AVH. He took medications without issue. His CBG was 127 at lunchtime, for which he received 3 units of Novolog. He verbalized readiness for discharge in the a.m and at various points throughout the day. On his self inventory, he reported fair sleep, fair appetite, normal energy level, and good concentration. He rated his depression 1/10, hopelessness 0/10, and anxiety 1/10. He worried over arranging transportation and ultimately settled on leaving to catch the bus to [facility], where a family member worked. Patient was discharged per order. AVS, medications, scripts, transition summary, SRA, and suicide safety plan were all reviewed with patient. Pt was given an opportunity to ask questions and verbalized understanding of all discharge paperwork. Belongings were returned, and patient signed for receipt. Patient verbalized readiness for discharge and appeared in no acute distress when escorted out of lobby with bus passes.

## 2016-10-20 NOTE — Progress Notes (Signed)
Nursing Progress Note: 7p-7a D: Pt currently presents with a pleasant affect and behavior. Pt states "I tried to be more positive today, and it has really been that much more beneficial to me. It was a good day." Interacting appropriately with milieu. Pt reports good sleep with current medication regimen.   A: Pt provided with medications per providers orders. Pt's labs and vitals were monitored throughout the night. Pt supported emotionally and encouraged to express concerns and questions. Pt educated on medications.  R: Pt's safety ensured with 15 minute and environmental checks. Pt currently denies SI/HI/Self Harm and AVH. Pt verbally contracts to seek staff if SI/HI or A/VH occurs and to consult with staff before acting on any harmful thoughts. Will continue to monitor.

## 2016-10-20 NOTE — Discharge Summary (Signed)
Physician Discharge Summary Note  Patient:  Edgar White is an 51 y.o., male  MRN:  [id number]  DOB:  1966/05/29  Patient phone:  [phone] (home)   Patient address:   [place] [address],   Total Time spent with patient: Greater than 30 minutes  Date of Admission:  10/13/2016 Date of Discharge: 10-20-16  Reason for Admission: Suicidal ideations.  Principal Problem: Substance induced mood disorder [facility]), MDD  Discharge Diagnoses: Patient Active Problem List   Diagnosis Date Noted  . Substance induced mood disorder ([place]) [F19.94] 10/13/2016  . Diabetes mellitus without complication ([place]) A999333 06/08/2016  . Anxiety state [F41.1]   . Polysubstance abuse [F19.10]   . Major depressive disorder, recurrent severe without psychotic features ([place]) [F33.2] 04/13/2016   Past Psychiatric History: Major depressive disorder, recurrent episodes.  Past Medical History:  Past Medical History:  Diagnosis Date  . Depression   . Diabetes mellitus without complication [facility])     Past Surgical History:  Procedure Laterality Date  . INGUINAL HERNIA REPAIR    . LUMBAR FUSION     Family History: History reviewed. No pertinent family history.  Family Psychiatric  History: See H&P  Social History:  History  Alcohol Use  . Yes    Comment: once a week     History  Drug Use  . Types: Marijuana, Cocaine    Comment: Uses daily. Last used: this morning    Social History   Social History  . Marital status: Single    Spouse name: N/A  . Number of children: N/A  . Years of education: N/A   Social History Main Topics  . Smoking status: Current Every Day Smoker    Packs/day: 0.50    Types: Cigarettes  . Smokeless tobacco: Never Used  . Alcohol use Yes     Comment: once a week  . Drug use: Yes    Types: Marijuana, Cocaine     Comment: Uses daily. Last used: this morning  . Sexual activity: No   Other Topics Concern  . None   Social History Narrative  .  None   Hospital Course: Patient is a 52 YO male with a reported history of bipolar disorder, depression, personality disorder and substance abuse issues who presented to the ED with suicidal ideation. The patient stated that he has had increased psychosocial stressors over the last several years and "they have taken a toll on me". 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 His father also died during that period of time. After her death his family sold her house and he was asked to leave. He received approximately $38K and moved to [place]. He became involved in a relationship, and ended up spending all the money in 7 months "partying". He has a college degree but has not been able to maintain employment. He has a history of 4 previous hospitalizations, including one at [facility] last year. He had been hospitalized at [facility] 3 times and received ECT which "didn't work". On his last admission he was placed on celexa and zyprexa. The celexa wasn't working" then and he eventually stopped it. He also read about the zyprexa and stopped in when he found out it was for "schizophrenia". He admitted to using cocaine, THC and alcohol. He wanted to OD.  This is one of several discharge summaries for this 51 year old Caucasian male in the [place] alone.  Edgar White was a patient in this hospital last August 2017. He received mood stabilization treatment & was to resume mental health care on as needed basis. However, Edgar White was re-admitted to the [facility] adult unit on 10-13-16 with complaints of worsening symptoms of depression triggering suicidal ideations. He continued to cite some family dynamic issues going on as well since the deaths of both parents in 2016 respectively as the trigger. Edgar White apparently is not compliant with his medication regimen. He stopped his previous discharge mental health medications on his own without seeking the advice of his  outpatient psychiatric provider. He is also using multiple drugs. He was in need of mood stabilization treatments.   During the course of this present hospitalization, Edgar White was medicated & discharged on, Effexor XR 150 mg for depression, Hydroxyzine 25 mg prn for anxiety, Nicotine patch 21 mg for smoking cessation & Trazodone 50 mg for insomnia. He was enrolled & participated in the group counseling sessions being offered & held on this unit.  He was counseled & learned coping skills that should help him cope better & maintain mood stability after discharge. Edgar White was resumed on all his pertinent home medications for the other previously existing medical issues that he presented. He tolerated his treatment regimen without any adverse effects reported.   While his treatment was on going, Edgar White's improvement was monitored by observation & his daily reports of symptom reduction noted.  His emotional & mental status were monitored by daily self-inventory reports completed by him & the clinical staff. He was evaluated daily by the treatment team for mood stability & the need for continued recovery after discharge. He was offered further treatment options upon discharge & will follow up with the outpatient psychiatric services as listed below.  Edgar White has also tried ECT for his Major depression which again did not seem to have helped him.   Upon discharge, Edgar White was both mentally & medically stable for discharge. He is currently denying suicidal, homicidal ideation, auditory, visual/tactile hallucinations, delusional thoughts & or paranoia. He left [facility] with all personal belongings in no apparent distress. He received from the [facility], a 7 days worth, supply samples of his [facility] discharge medications. Transportation per his arrangement.  Physical Findings: AIMS: Facial and Oral Movements Muscles of Facial Expression: None, normal Lips and Perioral Area: None, normal Jaw: None, normal Tongue: None,  normal,Extremity Movements Upper (arms, wrists, hands, fingers): None, normal Lower (legs, knees, ankles, toes): None, normal, Trunk Movements Neck, shoulders, hips: None, normal, Overall Severity Severity of abnormal movements (highest score from questions above): None, normal Incapacitation due to abnormal movements: None, normal Patient's awareness of abnormal movements (rate only patient's report): No Awareness, Dental Status Current problems with teeth and/or dentures?: No Does patient usually wear dentures?: No  CIWA:  CIWA-Ar Total: 1 COWS:  COWS Total Score: 2  Musculoskeletal: Strength & Muscle Tone: within normal limits Gait & Station: normal Patient leans: N/A  Psychiatric Specialty Exam: Physical Exam  Constitutional: He appears well-developed.  HENT:  Head: Normocephalic.  Eyes: Pupils are equal, round, and reactive to light.  Neck: Normal range of motion.  Cardiovascular: Normal rate.   Respiratory: Effort normal.  GI: Soft.    Review of Systems  Constitutional: Negative.   HENT: Negative.   Eyes: Negative.   Respiratory: Negative.   Cardiovascular: Negative.   Genitourinary: Negative.   Musculoskeletal: Negative.   Skin: Negative.   Neurological: Negative.   Endo/Heme/Allergies: Negative.   Psychiatric/Behavioral: Positive for depression and substance  abuse (Hx. Benzodiazepine, Cocaine & THC dependence). Negative for hallucinations, memory loss and suicidal ideas (Stable). The patient has insomnia (Stable). The patient is not nervous/anxious.     Blood pressure 131/87, pulse 96, temperature 97.7 F (36.5 C), temperature source Oral, resp. rate 18, height 6' (1.829 m), weight 78 kg (172 lb), SpO2 98 %.Body mass index is 23.33 kg/m.  See Md's SRA   Have you used any form of tobacco in the last 30 days? (Cigarettes, Smokeless Tobacco, Cigars, and/or Pipes): Yes  Has this patient used any form of tobacco in the last 30 days? (Cigarettes, Smokeless Tobacco,  Cigars, and/or Pipes):Yes, provided with nicotine patch 21 mg prescription upon discharge for smoking cessation.  Blood Alcohol level:  Lab Results  Component Value Date   ETH <5 10/12/2016   ETH <5 123456   Metabolic Disorder Labs:  Lab Results  Component Value Date   HGBA1C 8.3 (H) 10/13/2016   MPG 192 10/13/2016   MPG 140 04/15/2016   No results found for: PROLACTIN Lab Results  Component Value Date   CHOL 207 (H) 04/15/2016   TRIG 174 (H) 04/15/2016   HDL 34 (L) 04/15/2016   CHOLHDL 6.1 04/15/2016   VLDL 35 04/15/2016   LDLCALC 138 (H) 04/15/2016   LDLCALC SEE COMMENT 02/09/2012   See Psychiatric Specialty Exam and Suicide Risk Assessment completed by Attending Physician prior to discharge.  Discharge destination:  Home  Is patient on multiple antipsychotic therapies at discharge:  No   Has Patient had three or more failed trials of antipsychotic monotherapy by history:  No  Recommended Plan for Multiple Antipsychotic Therapies: NA  Allergies as of 10/20/2016   No Known Allergies     Medication List    STOP taking these medications   citalopram 40 MG tablet Commonly known as:  CELEXA   OLANZapine 2.5 MG tablet Commonly known as:  ZYPREXA     TAKE these medications     Indication  amoxicillin 500 MG capsule Commonly known as:  AMOXIL Take 1 capsule (500 mg total) by mouth every 8 (eight) hours. For tooth infection  Indication:  Tooth infection   benzocaine 10 % mucosal gel Commonly known as:  ORAJEL Use as directed in the mouth or throat 3 (three) times daily as needed for mouth pain.  Indication:  Tooth pain   hydrOXYzine 25 MG tablet Commonly known as:  ATARAX/VISTARIL Take 1 tablet (25 mg total) by mouth every 6 (six) hours as needed for anxiety.  Indication:  Anxiety Neurosis   metFORMIN 500 MG tablet Commonly known as:  GLUCOPHAGE Take 1 tablet (500 mg total) by mouth 2 (two) times daily with a meal. For diabetes management What changed:   additional instructions  Indication:  Type 2 Diabetes   nicotine 21 mg/24hr patch Commonly known as:  NICODERM CQ - dosed in mg/24 hours Place 1 patch (21 mg total) onto the skin daily. For smoking cessation Start taking on:  10/21/2016  Indication:  Nicotine Addiction   traZODone 50 MG tablet Commonly known as:  DESYREL Take 1 tablet (50 mg total) by mouth at bedtime as needed for sleep. What changed:  when to take this  reasons to take this  additional instructions  Indication:  Trouble Sleeping   venlafaxine XR 150 MG 24 hr capsule Commonly known as:  EFFEXOR-XR Take 1 capsule (150 mg total) by mouth daily with breakfast. For depression Start taking on:  10/21/2016  Indication:  Major Depressive Disorder  [place] Follow up.   Why:  Please go for a walk-in appointment within 1-3 days of discharge to be established for outpatient services.Walk-in hours are 8a-3p Mon-Fri. Please arrive as early as possible to be sure you are seen. Contact information: [place] [address] [phone]          Follow-up recommendations: Activity:  As tolerated Diet: As recommended by your primary care doctor. Keep all scheduled follow-up appointments as recommended.   Comments: Patient is instructed prior to discharge to: Take all medications as prescribed by his/her mental healthcare provider. Report any adverse effects and or reactions from the medicines to his/her outpatient provider promptly. Patient has been instructed & cautioned: To not engage in alcohol and or illegal drug use while on prescription medicines. In the event of worsening symptoms, patient is instructed to call the crisis hotline, 911 and or go to the nearest ED for appropriate evaluation and treatment of symptoms. To follow-up with his/her primary care provider for your other medical issues, concerns and or health care needs.  Signed: Encarnacion Slates, NP, PMHNP, FNP-BC 10/20/2016, 10:07 AM

## 2016-10-20 NOTE — BHH Group Notes (Signed)
[place] Group Notes:  (Nursing/MHT/Case Management/Adjunct)  Date:  10/20/2016  Time:  10:13 AM  Type of Therapy:  Nurse Education  Participation Level:  Active  Participation Quality:  Appropriate  Affect:  Appropriate  Cognitive:  Appropriate and Oriented  Insight:  Appropriate  Engagement in Group:  Engaged  Modes of Intervention:  Discussion, Education and Support  Summary of Progress/Problems: Pt shared that he is discharging. Though he is nervous, he hopes to remain calm.   Marya Landry 10/20/2016, 10:13 AM

## 2016-10-20 NOTE — BHH Suicide Risk Assessment (Signed)
[facility] Discharge Suicide Risk Assessment   Principal Problem: Substance Induced Mood Disorder Discharge Diagnoses:  Patient Active Problem List   Diagnosis Date Noted  . Substance induced mood disorder ([place]) [F19.94] 10/13/2016  . Diabetes mellitus without complication ([place]) A999333 06/08/2016  . Anxiety state [F41.1]   . Polysubstance abuse [F19.10]   . Major depressive disorder, recurrent severe without psychotic features ([place]) [F33.2] 04/13/2016    Total Time spent with patient: 30 minutes  Musculoskeletal: Strength & Muscle Tone: within normal limits Gait & Station: normal Patient leans: N/A  Psychiatric Specialty Exam: ROS  Blood pressure 131/87, pulse 96, temperature 97.7 F (36.5 C), temperature source Oral, resp. rate 18, height 6' (1.829 m), weight 78 kg (172 lb), SpO2 98 %.Body mass index is 23.33 kg/m.  General Appearance: Neatly dressed, pleasant, engaging well and cooperative. Appropriate behavior. Not in any distress. Good relatedness. Not internally stimulated  Eye Contact::  Good  Speech: Spontaneous, normal prosody. Normal tone and rate.   Volume:  Normal  Mood:  Euthymic  Affect:  Appropriate and Full Range  Thought Process:  Linear and organized   Orientation:  Full (Time, Place, and Person)  Thought Content:  No delusional theme. No preoccupation with violent thoughts. No negative ruminations. No obsession.  No hallucination in any modality.   Suicidal Thoughts:  No  Homicidal Thoughts:  No  Memory:  Immediate;   Good Recent;   Good Remote;   Good  Judgement:  Good  Insight:  Good  Psychomotor Activity:  Normal  Concentration:  Good  Recall:  Good  Fund of Knowledge:Good  Language: Good  Akathisia:  No  Handed:    AIMS (if indicated):     Assets:  Communication Skills Desire for Improvement Housing Physical Health Social Support  Sleep:  Number of Hours: 5.5  Cognition: WNL  ADL's:  Intact   Clinical Assessment:  51 yo Caucasian male,  single, homeless but has been living with his brother. Presented to the ER with thoughts of suicide. Had an argument with his brother over the phone. Felt down and rejected after the conversation. Started ruminating about suicide. Started ruminating on recent loss of his mom. Expressed regrets for dropping a case he filed against his family for fraud. UDS was positive for cocaine,THC and benzodiazepines.  Seen today. He has responded well to medication adjustment. His mood has improved. He is no longer depressed. His brother has offered accommodation again. Patient is pleased with this. Patient has come off psychoactive substances. He has been attending meetings here. Says he is motivated to stay sober. He plans to connect with AA upon discharge. Biological functions has returned to normal. He is able to think clearly. No racing thoughts. He is not expressing any thoughts of violence. He is optimistic about the future.  Nursing staff reports that patient has been appropriate on the unit. Patient has been interacting well with peers. No behavioral issues. Patient has not voiced any suicidal thoughts. Patient has not been observed to be internally stimulated. Patient has been adherent with treatment recommendations. Patient has been tolerating his medication well.   Treatment team agrees that he is at baseline. Treatment team agrees with discharge plan.   Demographic Factors:  Male and Caucasian  Loss Factors: NA  Historical Factors: Impulsivity  Risk Reduction Factors:   Sense of responsibility to family, Living with another person, especially a relative, Positive social support, Positive therapeutic relationship and Positive coping skills or problem solving skills  Continued Clinical Symptoms:  Symptoms he presented wit has resolved.   Cognitive Features That Contribute To Risk:  None    Suicide Risk:  Minimal: No identifiable suicidal ideation.   Patient is not having any thoughts of  suicide at this time. Modifiable risk factors targeted during this admission includes depression, homelessness  and substance use. Demographical and historical risk factors cannot be modified. Patient is now engaging well. Patient is reliable and is future oriented. We have buffered patient's support structures. At this point, patient is at low risk of suicide. Patient is aware of the effects of psychoactive substances on decision making process. Patient has been provided with emergency contacts. Patient acknowledges to use resources provided if unforseen circumstances changes their current risk stratification.    [facility] Follow up.   Why:  Please go for a walk-in appointment within 1-3 days of discharge to be established for outpatient services.Walk-in hours are 8a-3p Mon-Fri. Please arrive as early as possible to be sure you are seen. Contact information: [place] [address] [phone]           Plan Of Care/Follow-up recommendations:  1. Continue current psychotropic medications 2. Mental health and addiction follow up as arranged.  3. Discharge in care of family    [place], MD 10/20/2016, 9:30 AM

## 2016-11-03 ENCOUNTER — Encounter: Payer: Self-pay | Admitting: Emergency Medicine

## 2016-11-03 ENCOUNTER — Emergency Department
Admission: EM | Admit: 2016-11-03 | Discharge: 2016-11-04 | Disposition: A | Discharge status: Other Acute Inpatient Hospital | Payer: BLUE CROSS/BLUE SHIELD | Attending: Emergency Medicine | Admitting: Emergency Medicine

## 2016-11-03 DIAGNOSIS — K029 Dental caries, unspecified: Secondary | ICD-10-CM

## 2016-11-03 DIAGNOSIS — Z79899 Other long term (current) drug therapy: Secondary | ICD-10-CM | POA: Insufficient documentation

## 2016-11-03 DIAGNOSIS — F1910 Other psychoactive substance abuse, uncomplicated: Secondary | ICD-10-CM | POA: Diagnosis present

## 2016-11-03 DIAGNOSIS — F411 Generalized anxiety disorder: Secondary | ICD-10-CM | POA: Diagnosis present

## 2016-11-03 DIAGNOSIS — Z7984 Long term (current) use of oral hypoglycemic drugs: Secondary | ICD-10-CM | POA: Insufficient documentation

## 2016-11-03 DIAGNOSIS — R45851 Suicidal ideations: Secondary | ICD-10-CM

## 2016-11-03 DIAGNOSIS — E119 Type 2 diabetes mellitus without complications: Secondary | ICD-10-CM

## 2016-11-03 DIAGNOSIS — F329 Major depressive disorder, single episode, unspecified: Secondary | ICD-10-CM | POA: Insufficient documentation

## 2016-11-03 DIAGNOSIS — F332 Major depressive disorder, recurrent severe without psychotic features: Secondary | ICD-10-CM | POA: Diagnosis present

## 2016-11-03 DIAGNOSIS — F32A Depression, unspecified: Secondary | ICD-10-CM

## 2016-11-03 DIAGNOSIS — F17210 Nicotine dependence, cigarettes, uncomplicated: Secondary | ICD-10-CM | POA: Insufficient documentation

## 2016-11-03 LAB — COMPREHENSIVE METABOLIC PANEL
ALT: 24 U/L (ref 17–63)
AST: 25 U/L (ref 15–41)
Albumin: 4.8 g/dL (ref 3.5–5.0)
Alkaline Phosphatase: 78 U/L (ref 38–126)
Anion gap: 9 (ref 5–15)
BILIRUBIN TOTAL: 0.9 mg/dL (ref 0.3–1.2)
BUN: 16 mg/dL (ref 6–20)
CHLORIDE: 104 mmol/L (ref 101–111)
CO2: 22 mmol/L (ref 22–32)
CREATININE: 0.94 mg/dL (ref 0.61–1.24)
Calcium: 9.6 mg/dL (ref 8.9–10.3)
Glucose, Bld: 197 mg/dL — ABNORMAL HIGH (ref 65–99)
POTASSIUM: 3.9 mmol/L (ref 3.5–5.1)
Sodium: 135 mmol/L (ref 135–145)
TOTAL PROTEIN: 8.7 g/dL — AB (ref 6.5–8.1)

## 2016-11-03 LAB — URINE DRUG SCREEN, QUALITATIVE (ARMC ONLY)
Amphetamines, Ur Screen: NOT DETECTED
BENZODIAZEPINE, UR SCRN: NOT DETECTED
Barbiturates, Ur Screen: NOT DETECTED
CANNABINOID 50 NG, UR ~~LOC~~: POSITIVE — AB
Cocaine Metabolite,Ur ~~LOC~~: NOT DETECTED
MDMA (ECSTASY) UR SCREEN: NOT DETECTED
Methadone Scn, Ur: NOT DETECTED
Opiate, Ur Screen: NOT DETECTED
PHENCYCLIDINE (PCP) UR S: NOT DETECTED
TRICYCLIC, UR SCREEN: NOT DETECTED

## 2016-11-03 LAB — CBC
HCT: 47 % (ref 40.0–52.0)
Hemoglobin: 16.6 g/dL (ref 13.0–18.0)
MCH: 31.4 pg (ref 26.0–34.0)
MCHC: 35.3 g/dL (ref 32.0–36.0)
MCV: 89.1 fL (ref 80.0–100.0)
PLATELETS: 313 10*3/uL (ref 150–440)
RBC: 5.28 MIL/uL (ref 4.40–5.90)
RDW: 12.3 % (ref 11.5–14.5)
WBC: 11.8 10*3/uL — AB (ref 3.8–10.6)

## 2016-11-03 LAB — SALICYLATE LEVEL

## 2016-11-03 LAB — ACETAMINOPHEN LEVEL: Acetaminophen (Tylenol), Serum: <10 ug/mL — ABNORMAL LOW (ref 10–30)

## 2016-11-03 LAB — ETHANOL

## 2016-11-03 MED ORDER — NON FORMULARY: 75.0000 mg | Freq: Every day | Status: DC

## 2016-11-03 MED ORDER — IBUPROFEN 600 MG PO TABS
600.0000 mg | ORAL_TABLET | Freq: Once | ORAL | Status: AC
  Administered 2016-11-03: 600 mg via ORAL
  Filled 2016-11-03: qty 1

## 2016-11-03 MED ORDER — METFORMIN HCL 500 MG PO TABS
500.0000 mg | ORAL_TABLET | Freq: Two times a day (BID) | ORAL | Status: DC
  Administered 2016-11-04: 500 mg via ORAL
  Filled 2016-11-03: qty 1

## 2016-11-03 MED ORDER — DESVENLAFAXINE SUCCINATE ER 50 MG PO TB24
50.0000 mg | ORAL_TABLET | Freq: Two times a day (BID) | ORAL | Status: DC
  Administered 2016-11-03 – 2016-11-04 (×2): 50 mg via ORAL
  Filled 2016-11-03 (×3): qty 1

## 2016-11-03 MED ORDER — TRAZODONE HCL 100 MG PO TABS
100.0000 mg | ORAL_TABLET | Freq: Every day | ORAL | Status: DC
  Administered 2016-11-03: 100 mg via ORAL
  Filled 2016-11-03: qty 1

## 2016-11-03 MED ORDER — NON FORMULARY: 50.0000 mg | Freq: Two times a day (BID) | Status: DC

## 2016-11-03 MED ORDER — PENICILLIN V POTASSIUM 500 MG PO TABS
500.0000 mg | ORAL_TABLET | Freq: Four times a day (QID) | ORAL | Status: DC
  Administered 2016-11-03 – 2016-11-04 (×4): 500 mg via ORAL
  Filled 2016-11-03: qty 1
  Filled 2016-11-03: qty 2
  Filled 2016-11-03 (×3): qty 1

## 2016-11-03 NOTE — ED Triage Notes (Signed)
Pt reports he was discharged from [facility] 10/20/16 where he was treated for depression and SI. On effexor, states he has been taking it as prescribed. Pt reports that since being discharged he has been living with a friend who is an alcoholic and it is "not a good environment." States he has had worsening depression and SI since leaving [place]. Pt states he hasn't been eating much lately and has been thinking "maybe I can starve myself to death." Contracted for safety in triage.

## 2016-11-03 NOTE — ED Notes (Signed)
Pt. Transferred to [facility] from ED to room 4 after screening for contraband. Report to include Situation, Background, Assessment and Recommendations from [facility]. Pt. Oriented to unit including Q15 minute rounds as well as the security cameras for their protection. Patient is alert and oriented, warm and dry in no acute distress. Patient denies SI, HI, and AVH. Pt. Encouraged to let me know if needs arise.

## 2016-11-03 NOTE — ED Notes (Signed)
Hourly rounding reveals patient sleeping in room. No complaints, stable, in no acute distress. Q15 minute rounds and monitoring via Security Cameras to continue. 

## 2016-11-03 NOTE — BH Assessment (Signed)
Assessment Note  Edgar White is an 51 y.o. male. who presented voluntarily reporting a history of depression and suicidal thoughts. Patient self reported that in August of last year he had multiple family members who passed away. Patient also reports that he was recently evicted from his home. Patient states he is currently staying with friends an unhealthy environment. Patient has shared he was recently discharged from [facility] on October 20, 2016. He has confirmed that he did follow up with [facility] and has an upcoming appointment on April 5th. He reports compliance with his Effexor medication although he states that he did not have the financial means to pay for is trazodone or hydroxyzine. Patient reports decreased appetite, inability to maintain restful sleep, and lack of motivation. Pt states that he cannot contract for safety. Patient reports that this morning he had thoughts of stabbing himself with kitchen knifes . Patient has a past history of major depressive disorder as well as PTSD. A thorough psychiatric evaluation has been completed including the evaluation of the patient, reviewing available medical/clinic records, evaluating the pts unique risks and protective factors, and discussing treatment recommendations. The patient does meet admission criteria at this time.   Diagnosis:  Major Depressive Disorder   Past Medical History:  Past Medical History:  Diagnosis Date  . Depression   . Diabetes mellitus without complication [facility])     Past Surgical History:  Procedure Laterality Date  . INGUINAL HERNIA REPAIR    . LUMBAR FUSION      Family History: History reviewed. No pertinent family history.  Social History:  reports that he has been smoking Cigarettes.  He has been smoking about 0.50 packs per day. He has never used smokeless tobacco. He reports that he drinks alcohol. He reports that he uses drugs, including Marijuana and Cocaine.  Additional Social History:  Alcohol / Drug  Use Pain Medications: None Prescriptions: currently taking Celexa and Trazadone prn Over the Counter: Folic acid, vitamins History of alcohol / drug use?: No history of alcohol / drug abuse Longest period of sobriety (when/how long): Not reported  CIWA: CIWA-Ar BP: 128/89 Pulse Rate: 100 COWS:    Allergies: No Known Allergies  Home Medications:  (Not in a hospital admission)  OB/GYN Status:  No LMP for male patient.  General Assessment Data Location of Assessment: [facility] ED TTS Assessment: In system Is this a Tele or Face-to-Face Assessment?: Face-to-Face Is this an Initial Assessment or a Re-assessment for this encounter?: Initial Assessment Marital status: Single Living Arrangements: Non-relatives/Friends Can pt return to current living arrangement?: Yes Admission Status: Involuntary Is patient capable of signing voluntary admission?: No Referral Source: Self/Family/Friend Insurance type: Equities trader Exam ([place]) Medical Exam completed: Yes  Crisis Care Plan Living Arrangements: Non-relatives/Friends Legal Guardian: Other: (n/a) Name of Psychiatrist: [facility] in [place] (Dr. 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 : Yes Suicidal Intent: No-Not Currently/Within Last 6 Months Has patient had any suicidal intent within the past 6 months prior to admission? : No Is patient at risk for suicide?: Yes Suicidal Plan?: Yes-Currently Present Has patient had any suicidal plan within the past 6 months prior to admission? : Yes Specify  Current Suicidal Plan: Stab himself  Access to Means: Yes What has been your use of drugs/alcohol within the last 12  months?: Denies  Previous Attempts/Gestures: Yes How many times?: 1 (2013) Other Self Harm Risks: 0 Triggers for Past Attempts: Unknown Intentional Self Injurious Behavior: None Family Suicide History: No Recent stressful life event(s): Conflict (Comment), Financial Problems Persecutory voices/beliefs?: No Depression: Yes Depression Symptoms: Tearfulness, Insomnia, Loss of interest in usual pleasures, Feeling worthless/self pity, Guilt Substance abuse history and/or treatment for substance abuse?: No Suicide prevention information given to non-admitted patients: Not applicable  Risk to Others within the past 6 months Homicidal Ideation: No Does patient have any lifetime risk of violence toward others beyond the six months prior to admission? : No Thoughts of Harm to Others: No Current Homicidal Intent: No Current Homicidal Plan: No Access to Homicidal Means: No Identified Victim: n/a History of harm to others?: No Assessment of Violence: None Noted Violent Behavior Description: n/a Does patient have access to weapons?: No Criminal Charges Pending?: No Does patient have a court date: No Is patient on probation?: No  Psychosis Hallucinations: None noted Delusions: None noted  Mental Status Report Appearance/Hygiene: Unremarkable Eye Contact: Poor Motor Activity: Freedom of movement Speech: Logical/coherent Level of Consciousness: Alert Mood: Anxious, Depressed Affect: Appropriate to circumstance Anxiety Level: Moderate Panic attack frequency: n/a Most recent panic attack: n/a Thought Processes: Relevant Judgement: Partial Orientation: Person, Place, Situation Obsessive Compulsive Thoughts/Behaviors: None  Cognitive Functioning Concentration: Decreased Memory: Remote Intact, Recent Intact IQ: Average Insight: Fair Impulse Control: Fair Appetite: Poor Weight Loss: 0 Weight Gain: 0 Sleep: Decreased Total Hours of Sleep: 5 Vegetative Symptoms:  None  ADLScreening [facility] Assessment Services) Patient's cognitive ability adequate to safely complete daily activities?: Yes Patient able to express need for assistance with ADLs?: Yes Independently performs ADLs?: Yes (appropriate for developmental age)  Prior Inpatient Therapy Prior Inpatient Therapy: Yes Prior Therapy Dates: 03/2016; 2012, 2013, 2014 Prior Therapy Facilty/Provider(s): [facility]; [facility] Reason for Treatment: inpatient care for SI  Prior Outpatient Therapy Prior Outpatient Therapy: Yes Prior Therapy Dates: 6 years to current Prior Therapy Facilty/Provider(s): [facility] in [place] Reason for Treatment: med management Does patient have an ACCT team?: No Does patient have Intensive In-House Services?  : No Does patient have [facility] services? : No Does patient have P4CC services?: No  ADL Screening (condition at time of admission) Patient's cognitive ability adequate to safely complete daily activities?: Yes Patient able to express need for assistance with ADLs?: Yes Independently performs ADLs?: Yes (appropriate for developmental age)       Abuse/Neglect Assessment (Assessment to be complete while patient is alone) Physical Abuse: Denies Verbal Abuse: Denies Sexual Abuse: Denies Exploitation of patient/patient's resources: Denies Self-Neglect: Denies Values / Beliefs Cultural Requests During Hospitalization: None Spiritual Requests During Hospitalization: None   Advance Directives (For Healthcare) Does Patient Have a Medical Advance Directive?: No Would patient like information on creating a medical advance directive?: No - Patient declined    Additional Information 1:1 In Past 12 Months?: No CIRT Risk: No Elopement Risk: No Does patient have medical clearance?: Yes     Disposition:  Disposition Initial Assessment Completed for this Encounter: Yes Disposition of Patient: Inpatient treatment program Type of inpatient treatment program: Adult  On Site  Evaluation by:   Reviewed with Physician:    Laretta Alstrom 11/03/2016 7:50 PM

## 2016-11-03 NOTE — ED Provider Notes (Signed)
[facility] Emergency Department Provider Note  ____________________________________________   First MD Initiated Contact with Patient 11/03/16 1444     (approximate)  I have reviewed the triage vital signs and the nursing notes.   HISTORY  Chief Complaint Depression and Suicidal   HPI Edgar White is a 51 y.o. male with a history of depression and diabetes who is presenting to the emergency department today with suicidal ideation and feeling depressed. He says that it's all started this past August when he experienced multiple deaths of people he was close to as well as being evicted from his home. He says that he has now been admitted twice to [facility] to their psychiatric unit. He is coming back tomorrow the emergency department today because he is staying with a friend who is an alcoholic. Patient has begun to have suicidal thoughts. No attempts prior to arrival. No toxic ingestion. Does not report any drug use.  He is also significant is right upper tooth pain and is taking amoxicillin. He is noncompliant with the medications and says that he took 1 amoxicillin yesterday. Has not seen a dentist.    Past Medical History:  Diagnosis Date  . Depression   . Diabetes mellitus without complication [facility])     Patient Active Problem List   Diagnosis Date Noted  . Substance induced mood disorder ([place]) 10/13/2016  . Diabetes mellitus without complication ([place]) 61/44/3154  . Anxiety state   . Polysubstance abuse   . Major depressive disorder, recurrent severe without psychotic features ([place]) 04/13/2016    Past Surgical History:  Procedure Laterality Date  . INGUINAL HERNIA REPAIR    . LUMBAR FUSION      Prior to Admission medications   Medication Sig Start Date End Date Taking? Authorizing Provider  amoxicillin (AMOXIL) 500 MG capsule Take 1 capsule (500 mg total) by mouth every 8 (eight) hours. For tooth infection 10/20/16   Encarnacion Slates,  NP  benzocaine (ORAJEL) 10 % mucosal gel Use as directed in the mouth or throat 3 (three) times daily as needed for mouth pain. 10/20/16   Encarnacion Slates, NP  hydrOXYzine (ATARAX/VISTARIL) 25 MG tablet Take 1 tablet (25 mg total) by mouth every 6 (six) hours as needed for anxiety. 10/20/16   Encarnacion Slates, NP  metFORMIN (GLUCOPHAGE) 500 MG tablet Take 1 tablet (500 mg total) by mouth 2 (two) times daily with a meal. For diabetes management 10/20/16   Encarnacion Slates, NP  nicotine (NICODERM CQ - DOSED IN MG/24 HOURS) 21 mg/24hr patch Place 1 patch (21 mg total) onto the skin daily. For smoking cessation 10/21/16   Encarnacion Slates, NP  traZODone (DESYREL) 50 MG tablet Take 1 tablet (50 mg total) by mouth at bedtime as needed for sleep. 10/20/16   Encarnacion Slates, NP  venlafaxine XR (EFFEXOR-XR) 150 MG 24 hr capsule Take 1 capsule (150 mg total) by mouth daily with breakfast. For depression 10/21/16   Encarnacion Slates, NP    Allergies Patient has no known allergies.  History reviewed. No pertinent family history.  Social History Social History  Substance Use Topics  . Smoking status: Current Every Day Smoker    Packs/day: 0.50    Types: Cigarettes  . Smokeless tobacco: Never Used  . Alcohol use Yes     Comment: once a week    Review of Systems Constitutional: No fever/chills Eyes: No visual changes. ENT: No sore throat. Cardiovascular: Denies chest  pain. Respiratory: Denies shortness of breath. Gastrointestinal: No abdominal pain.  No nausea, no vomiting.  No diarrhea.  No constipation. Genitourinary: Negative for dysuria. Musculoskeletal: Negative for back pain. Skin: Negative for rash. Neurological: Negative for headaches, focal weakness or numbness.  10-point ROS otherwise negative.  ____________________________________________   PHYSICAL EXAM:  VITAL SIGNS: ED Triage Vitals  Enc Vitals Group     BP 11/03/16 1428 128/89     Pulse Rate 11/03/16 1428 100     Resp 11/03/16 1428 16     Temp  11/03/16 1428 97.9 F (36.6 C)     Temp Source 11/03/16 1428 Oral     SpO2 11/03/16 1428 94 %     Weight 11/03/16 1421 174 lb (78.9 kg)     Height 11/03/16 1421 5\' 10"  (1.778 m)     Head Circumference --      Peak Flow --      Pain Score 11/03/16 1421 7     Pain Loc --      Pain Edu? --      Excl. in [place]? --     Constitutional: Alert and oriented. Well appearing and in no acute distress. Eyes: Conjunctivae are normal. PERRL. EOMI. Head: Atraumatic. Nose: No congestion/rhinnorhea. Mouth/Throat: Mucous membranes are moist.  Oropharynx non-erythematous.  No oral/facial swelling noted. Right, posterior most molar which is mobile with erosion to the gums. Painful when moved. Neck: No stridor.   Cardiovascular: Normal rate, regular rhythm. Grossly normal heart sounds.   Respiratory: Normal respiratory effort.  No retractions. Lungs CTAB. Gastrointestinal: Soft and nontender. No distention.  Musculoskeletal: No lower extremity tenderness nor edema.  No joint effusions. Neurologic:  Normal speech and language. No gross focal neurologic deficits are appreciated.  Skin:  Skin is warm, dry and intact. No rash noted. Psychiatric: Mood and affect are normal. Speech and behavior are normal.  ____________________________________________   LABS (all labs ordered are listed, but only abnormal results are displayed)  Labs Reviewed  COMPREHENSIVE METABOLIC PANEL - Abnormal; Notable for the following:       Result Value   Glucose, Bld 197 (*)    Total Protein 8.7 (*)    All other components within normal limits  ACETAMINOPHEN LEVEL - Abnormal; Notable for the following:    Acetaminophen (Tylenol), Serum <10 (*)    All other components within normal limits  CBC - Abnormal; Notable for the following:    WBC 11.8 (*)    All other components within normal limits  ETHANOL  SALICYLATE LEVEL  URINE DRUG SCREEN, QUALITATIVE ([facility] ONLY)    ____________________________________________  EKG   ____________________________________________  RADIOLOGY   ____________________________________________   PROCEDURES  Procedure(s) performed:   Procedures  Critical Care performed:   ____________________________________________   INITIAL IMPRESSION / ASSESSMENT AND PLAN / ED COURSE  Pertinent labs & imaging results that were available during my care of the patient were reviewed by me and considered in my medical decision making (see chart for details).  Patient placed on penicillin VK for his dental pain. Ordered ibuprofen. IVC paperwork also filled out. The patient is understanding the plan for her IVC and evaluation by psychiatry and is willing to comply.      ____________________________________________   FINAL CLINICAL IMPRESSION(S) / ED [place]. Depression. Suicidal ideation.    NEW MEDICATIONS STARTED DURING THIS VISIT:  New Prescriptions   No medications on file     Note:  This document was prepared using Dragon voice recognition  software and may include unintentional dictation errors.    Orbie Pyo, MD 11/03/16 1535

## 2016-11-03 NOTE — ED Notes (Signed)
Hourly rounding reveals patient in room. No complaints, stable, in no acute distress. Q15 minute rounds and monitoring via [employer] Cam[employer] to continue. 

## 2016-11-03 NOTE — Consult Note (Signed)
[facility] Consult   Reason for Consult:  Consult for 51 year old man with a history of recurrent severe depression who presents to the emergency room with suicidal ideation Referring Physician:  [place] Patient Identification: Edgar White MRN:  [id number] Principal Diagnosis: Major depressive disorder, recurrent severe without psychotic features [facility]) Diagnosis:   Patient Active Problem List   Diagnosis Date Noted  . Substance induced mood disorder ([place]) [F19.94] 10/13/2016  . Diabetes mellitus without complication ([place]) [W09.8] 06/08/2016  . Anxiety state [F41.1]   . Polysubstance abuse [F19.10]   . Major depressive disorder, recurrent severe without psychotic features ([place]) [F33.2] 04/13/2016    Total Time spent with patient: 1 hour  Subjective:   Edgar White is a 51 y.o. male patient admitted with "I'm very depressed".  HPI:  Patient interviewed. Chart reviewed. 51 year old man with a long history of severe depression. Presents to the emergency room today because of worsening suicidal ideation. Depression has been present for years but has been especially bad for the last several months. Patient was just discharged from [facility] on March 1. Since then he has gradually gotten more depressed. He is having crying spells that go on for hours a day. His sleep is been very poor. He feels hopeless and negative all the time. He is not having any psychotic symptoms or hallucinations. He has begun to have intrusive thoughts about stabbing himself with a knife. Patient says that he has been compliant with his medication which is Effexor 150 mg per day. He feels like Effexor has been ineffective in the past and does not want to continue it. Currently homeless. Doesn't have a place to stay that is working out. Has no income and very few resources.  Social history: Currently homeless. He was staying with a friend in [place] but that is not working out. Mother died not too  long ago and that has been a crushing loss for him.  Medical history: Diabetes mostly controlled with oral medications  Substance abuse history: Patient has an extensive history of abuse of alcohol and drugs including cocaine and marijuana and narcotics but he says he has been sober and off of alcohol and drugs at least for the last 2 or 3 weeks.  Past Psychiatric History: Patient has a history of long-standing depression. No history of mania. Has had several prior hospitalizations. Positive history of suicide attempts. Patient has been on multiple medications and has had ECT. He thinks that Pristiq was uniquely helpful for him.  Risk to Self: Is patient at risk for suicide?: Yes Risk to Others:   Prior Inpatient Therapy:   Prior Outpatient Therapy:    Past Medical History:  Past Medical History:  Diagnosis Date  . Depression   . Diabetes mellitus without complication [facility])     Past Surgical History:  Procedure Laterality Date  . INGUINAL HERNIA REPAIR    . LUMBAR FUSION     Family History: History reviewed. No pertinent family history. Family Psychiatric  History: Positive for depression and anxiety Social History:  History  Alcohol Use  . Yes    Comment: once a week     History  Drug Use  . Types: Marijuana, Cocaine    Comment: Uses daily. Last used: this morning    Social History   Social History  . Marital status: Single    Spouse name: N/A  . Number of children: N/A  . Years of education: N/A   Social History Main Topics  .  Smoking status: Current Every Day Smoker    Packs/day: 0.50    Types: Cigarettes  . Smokeless tobacco: Never Used  . Alcohol use Yes     Comment: once a week  . Drug use: Yes    Types: Marijuana, Cocaine     Comment: Uses daily. Last used: this morning  . Sexual activity: No   Other Topics Concern  . None   Social History Narrative  . None   Additional Social History:    Allergies:  No Known Allergies  Labs:  Results for  orders placed or performed during the hospital encounter of 11/03/16 (from the past 48 hour(s))  Comprehensive metabolic panel     Status: Abnormal   Collection Time: 11/03/16  2:27 PM  Result Value Ref Range   Sodium 135 135 - 145 mmol/L   Potassium 3.9 3.5 - 5.1 mmol/L   Chloride 104 101 - 111 mmol/L   CO2 22 22 - 32 mmol/L   Glucose, Bld 197 (H) 65 - 99 mg/dL   BUN 16 6 - 20 mg/dL   Creatinine, Ser 0.94 0.61 - 1.24 mg/dL   Calcium 9.6 8.9 - 10.3 mg/dL   Total Protein 8.7 (H) 6.5 - 8.1 g/dL   Albumin 4.8 3.5 - 5.0 g/dL   AST 25 15 - 41 U/L   ALT 24 17 - 63 U/L   Alkaline Phosphatase 78 38 - 126 U/L   Total Bilirubin 0.9 0.3 - 1.2 mg/dL   GFR calc non Af Amer >60 >60 mL/min   GFR calc Af Amer >60 >60 mL/min    Comment: (NOTE) The eGFR has been calculated using the CKD EPI equation. This calculation has not been validated in all clinical situations. eGFR's persistently <60 mL/min signify possible Chronic Kidney Disease.    Anion gap 9 5 - 15  Ethanol     Status: None   Collection Time: 11/03/16  2:27 PM  Result Value Ref Range   Alcohol, Ethyl (B) <5 <5 mg/dL    Comment:        LOWEST DETECTABLE LIMIT FOR SERUM ALCOHOL IS 5 mg/dL FOR MEDICAL PURPOSES ONLY   Salicylate level     Status: None   Collection Time: 11/03/16  2:27 PM  Result Value Ref Range   Salicylate Lvl <3.9 2.8 - 30.0 mg/dL  Acetaminophen level     Status: Abnormal   Collection Time: 11/03/16  2:27 PM  Result Value Ref Range   Acetaminophen (Tylenol), Serum <10 (L) 10 - 30 ug/mL    Comment:        THERAPEUTIC CONCENTRATIONS VARY SIGNIFICANTLY. A RANGE OF 10-30 ug/mL MAY BE AN EFFECTIVE CONCENTRATION FOR MANY PATIENTS. HOWEVER, SOME ARE BEST TREATED AT CONCENTRATIONS OUTSIDE THIS RANGE. ACETAMINOPHEN CONCENTRATIONS >150 ug/mL AT 4 HOURS AFTER INGESTION AND >50 ug/mL AT 12 HOURS AFTER INGESTION ARE OFTEN ASSOCIATED WITH TOXIC REACTIONS.   cbc     Status: Abnormal   Collection Time: 11/03/16   2:27 PM  Result Value Ref Range   WBC 11.8 (H) 3.8 - 10.6 K/uL   RBC 5.28 4.40 - 5.90 MIL/uL   Hemoglobin 16.6 13.0 - 18.0 g/dL   HCT 47.0 40.0 - 52.0 %   MCV 89.1 80.0 - 100.0 fL   MCH 31.4 26.0 - 34.0 pg   MCHC 35.3 32.0 - 36.0 g/dL   RDW 12.3 11.5 - 14.5 %   Platelets 313 150 - 440 K/uL    Current Facility-Administered Medications  Medication Dose  Route Frequency Provider Last Rate Last Dose  . [START ON 11/04/2016] metFORMIN (GLUCOPHAGE) tablet 500 mg  500 mg Oral BID WC Gonzella Lex, MD      . NON FORMULARY 50 mg  50 mg Oral BID Gonzella Lex, MD      . penicillin v potassium (VEETID) tablet 500 mg  500 mg Oral Q6H Orbie Pyo, MD   500 mg at 11/03/16 1534   Current Outpatient Prescriptions  Medication Sig Dispense Refill  . amoxicillin (AMOXIL) 500 MG capsule Take 1 capsule (500 mg total) by mouth every 8 (eight) hours. For tooth infection 11 capsule 0  . benzocaine (ORAJEL) 10 % mucosal gel Use as directed in the mouth or throat 3 (three) times daily as needed for mouth pain. 5.3 g 0  . hydrOXYzine (ATARAX/VISTARIL) 25 MG tablet Take 1 tablet (25 mg total) by mouth every 6 (six) hours as needed for anxiety. 60 tablet 0  . metFORMIN (GLUCOPHAGE) 500 MG tablet Take 1 tablet (500 mg total) by mouth 2 (two) times daily with a meal. For diabetes management 60 tablet 0  . nicotine (NICODERM CQ - DOSED IN MG/24 HOURS) 21 mg/24hr patch Place 1 patch (21 mg total) onto the skin daily. For smoking cessation 28 patch 0  . traZODone (DESYREL) 50 MG tablet Take 1 tablet (50 mg total) by mouth at bedtime as needed for sleep. 30 tablet 0  . venlafaxine XR (EFFEXOR-XR) 150 MG 24 hr capsule Take 1 capsule (150 mg total) by mouth daily with breakfast. For depression 30 capsule 0    Musculoskeletal: Strength & Muscle Tone: within normal limits Gait & Station: normal Patient leans: N/A  Psychiatric Specialty Exam: Physical Exam  Nursing note and vitals reviewed. Constitutional:  He appears well-developed and well-nourished.  HENT:  Head: Normocephalic and atraumatic.  Eyes: Conjunctivae are normal. Pupils are equal, round, and reactive to light.  Neck: Normal range of motion.  Cardiovascular: Regular rhythm and normal heart sounds.   Respiratory: Effort normal. No respiratory distress.  GI: Soft.  Musculoskeletal: Normal range of motion.  Neurological: He is alert.  Skin: Skin is warm and dry.  Psychiatric: His speech is delayed. He is slowed and withdrawn. Cognition and memory are normal. He expresses impulsivity. He exhibits a depressed mood. He expresses suicidal ideation. He expresses suicidal plans.    Review of Systems  Constitutional: Negative.   HENT: Negative.   Eyes: Negative.   Respiratory: Negative.   Cardiovascular: Negative.   Gastrointestinal: Negative.   Musculoskeletal: Negative.   Skin: Negative.   Neurological: Negative.   Psychiatric/Behavioral: Positive for depression and suicidal ideas. Negative for hallucinations, memory loss and substance abuse. The patient is nervous/anxious and has insomnia.     Blood pressure 128/89, pulse 100, temperature 97.9 F (36.6 C), temperature source Oral, resp. rate 16, height 5' 10"  (1.778 m), weight 78.9 kg (174 lb), SpO2 94 %.Body mass index is 24.97 kg/m.  General Appearance: Casual  Eye Contact:  Good  Speech:  Clear and Coherent  Volume:  Decreased  Mood:  Depressed  Affect:  Congruent  Thought Process:  Goal Directed  Orientation:  Full (Time, Place, and Person)  Thought Content:  Logical  Suicidal Thoughts:  Yes.  with intent/plan  Homicidal Thoughts:  No  Memory:  Immediate;   Good Recent;   Fair Remote;   Fair  Judgement:  Fair  Insight:  Fair  Psychomotor Activity:  Decreased  Concentration:  Concentration: Fair  Recall:  [place] of Knowledge:  Fair  Language:  Fair  Akathisia:  No  Handed:  Right  AIMS (if indicated):     Assets:  Communication Skills Desire for  Improvement Resilience  ADL's:  Intact  Cognition:  WNL  Sleep:        Treatment Plan Summary: Daily contact with patient to assess and evaluate symptoms and progress in treatment, Medication management and Plan 51 year old man with severe recurrent depression actively suicidal. Patient will be admitted to the psychiatric unit. I have put in a nonformulary request to restart Pristiq which has been uniquely effective for him in the past. Continue metformin. 15 minute checks in place. Full set of labs. Patient agrees to the plan. Reviewed with emergency room physician and TTS.  Disposition: Recommend psychiatric Inpatient admission when medically cleared. Supportive therapy provided about ongoing stressors.  Alethia Berthold, MD 11/03/2016 6:31 PM

## 2016-11-04 ENCOUNTER — Inpatient Hospital Stay
Admission: RE | Admit: 2016-11-04 | Discharge: 2016-11-10 | DRG: 885 | Disposition: A | Discharge status: Home / Self Care | Payer: No Typology Code available for payment source | Source: Intra-hospital | Attending: Psychiatry | Admitting: Psychiatry

## 2016-11-04 ENCOUNTER — Encounter: Payer: Self-pay | Admitting: Psychiatry

## 2016-11-04 DIAGNOSIS — F1910 Other psychoactive substance abuse, uncomplicated: Secondary | ICD-10-CM | POA: Diagnosis present

## 2016-11-04 DIAGNOSIS — Z915 Personal history of self-harm: Secondary | ICD-10-CM | POA: Diagnosis not present

## 2016-11-04 DIAGNOSIS — F332 Major depressive disorder, recurrent severe without psychotic features: Principal | ICD-10-CM | POA: Diagnosis present

## 2016-11-04 DIAGNOSIS — G4700 Insomnia, unspecified: Secondary | ICD-10-CM | POA: Diagnosis present

## 2016-11-04 DIAGNOSIS — Z791 Long term (current) use of non-steroidal anti-inflammatories (NSAID): Secondary | ICD-10-CM

## 2016-11-04 DIAGNOSIS — Z794 Long term (current) use of insulin: Secondary | ICD-10-CM

## 2016-11-04 DIAGNOSIS — E7800 Pure hypercholesterolemia, unspecified: Secondary | ICD-10-CM | POA: Diagnosis present

## 2016-11-04 DIAGNOSIS — Z590 Homelessness: Secondary | ICD-10-CM | POA: Diagnosis not present

## 2016-11-04 DIAGNOSIS — F411 Generalized anxiety disorder: Secondary | ICD-10-CM | POA: Diagnosis present

## 2016-11-04 DIAGNOSIS — F17210 Nicotine dependence, cigarettes, uncomplicated: Secondary | ICD-10-CM | POA: Diagnosis present

## 2016-11-04 DIAGNOSIS — K047 Periapical abscess without sinus: Secondary | ICD-10-CM | POA: Diagnosis present

## 2016-11-04 DIAGNOSIS — F17200 Nicotine dependence, unspecified, uncomplicated: Secondary | ICD-10-CM | POA: Diagnosis present

## 2016-11-04 DIAGNOSIS — Z813 Family history of other psychoactive substance abuse and dependence: Secondary | ICD-10-CM

## 2016-11-04 DIAGNOSIS — E1165 Type 2 diabetes mellitus with hyperglycemia: Secondary | ICD-10-CM | POA: Diagnosis present

## 2016-11-04 DIAGNOSIS — E119 Type 2 diabetes mellitus without complications: Secondary | ICD-10-CM | POA: Diagnosis present

## 2016-11-04 DIAGNOSIS — F4310 Post-traumatic stress disorder, unspecified: Secondary | ICD-10-CM | POA: Diagnosis present

## 2016-11-04 DIAGNOSIS — Z7289 Other problems related to lifestyle: Secondary | ICD-10-CM

## 2016-11-04 DIAGNOSIS — R45851 Suicidal ideations: Secondary | ICD-10-CM | POA: Diagnosis present

## 2016-11-04 DIAGNOSIS — Z981 Arthrodesis status: Secondary | ICD-10-CM

## 2016-11-04 DIAGNOSIS — F1220 Cannabis dependence, uncomplicated: Secondary | ICD-10-CM | POA: Diagnosis present

## 2016-11-04 DIAGNOSIS — F1490 Cocaine use, unspecified, uncomplicated: Secondary | ICD-10-CM | POA: Diagnosis present

## 2016-11-04 DIAGNOSIS — Z79899 Other long term (current) drug therapy: Secondary | ICD-10-CM

## 2016-11-04 DIAGNOSIS — Z818 Family history of other mental and behavioral disorders: Secondary | ICD-10-CM

## 2016-11-04 LAB — URINE DRUG SCREEN, QUALITATIVE (ARMC ONLY)
Amphetamines, Ur Screen: NOT DETECTED
BARBITURATES, UR SCREEN: NOT DETECTED
BENZODIAZEPINE, UR SCRN: NOT DETECTED
Cannabinoid 50 Ng, Ur ~~LOC~~: POSITIVE — AB
Cocaine Metabolite,Ur ~~LOC~~: NOT DETECTED
MDMA (Ecstasy)Ur Screen: NOT DETECTED
METHADONE SCREEN, URINE: NOT DETECTED
OPIATE, UR SCREEN: NOT DETECTED
PHENCYCLIDINE (PCP) UR S: NOT DETECTED
Tricyclic, Ur Screen: NOT DETECTED

## 2016-11-04 LAB — GLUCOSE, CAPILLARY: Glucose-Capillary: 234 mg/dL — ABNORMAL HIGH (ref 65–99)

## 2016-11-04 MED ORDER — IBUPROFEN 800 MG PO TABS
800.0000 mg | ORAL_TABLET | Freq: Four times a day (QID) | ORAL | Status: DC | PRN
  Administered 2016-11-04 – 2016-11-07 (×6): 800 mg via ORAL
  Filled 2016-11-04 (×6): qty 1

## 2016-11-04 MED ORDER — DESVENLAFAXINE SUCCINATE ER 50 MG PO TB24
50.0000 mg | ORAL_TABLET | Freq: Two times a day (BID) | ORAL | Status: DC
  Filled 2016-11-04: qty 1

## 2016-11-04 MED ORDER — HYDROXYZINE HCL 50 MG PO TABS
50.0000 mg | ORAL_TABLET | Freq: Three times a day (TID) | ORAL | Status: DC | PRN
  Administered 2016-11-04 – 2016-11-10 (×4): 50 mg via ORAL
  Filled 2016-11-04 (×4): qty 1

## 2016-11-04 MED ORDER — DESVENLAFAXINE SUCCINATE ER 50 MG PO TB24
50.0000 mg | ORAL_TABLET | Freq: Every day | ORAL | Status: DC
  Administered 2016-11-05 – 2016-11-10 (×6): 50 mg via ORAL
  Filled 2016-11-04 (×6): qty 1

## 2016-11-04 MED ORDER — NICOTINE 21 MG/24HR TD PT24
21.0000 mg | MEDICATED_PATCH | Freq: Every day | TRANSDERMAL | Status: DC
  Administered 2016-11-04 – 2016-11-07 (×4): 21 mg via TRANSDERMAL
  Filled 2016-11-04 (×5): qty 1

## 2016-11-04 MED ORDER — METFORMIN HCL 500 MG PO TABS
500.0000 mg | ORAL_TABLET | Freq: Two times a day (BID) | ORAL | Status: DC
  Administered 2016-11-04 – 2016-11-09 (×10): 500 mg via ORAL
  Filled 2016-11-04 (×10): qty 1

## 2016-11-04 MED ORDER — TRAZODONE HCL 100 MG PO TABS
100.0000 mg | ORAL_TABLET | Freq: Every evening | ORAL | Status: DC | PRN
  Administered 2016-11-04 – 2016-11-08 (×5): 100 mg via ORAL
  Filled 2016-11-04 (×4): qty 1

## 2016-11-04 MED ORDER — ALUM & MAG HYDROXIDE-SIMETH 200-200-20 MG/5ML PO SUSP: 30.0000 mL | ORAL | Status: DC | PRN

## 2016-11-04 MED ORDER — PENICILLIN V POTASSIUM 500 MG PO TABS
500.0000 mg | ORAL_TABLET | Freq: Four times a day (QID) | ORAL | Status: DC
  Administered 2016-11-04 – 2016-11-10 (×22): 500 mg via ORAL
  Filled 2016-11-04 (×18): qty 1

## 2016-11-04 MED ORDER — ACETAMINOPHEN 325 MG PO TABS: 650.0000 mg | ORAL_TABLET | Freq: Four times a day (QID) | ORAL | Status: DC | PRN

## 2016-11-04 MED ORDER — ENSURE ENLIVE PO LIQD
237.0000 mL | Freq: Two times a day (BID) | ORAL | Status: DC
  Administered 2016-11-05 – 2016-11-10 (×11): 237 mL via ORAL

## 2016-11-04 MED ORDER — INSULIN ASPART 100 UNIT/ML ~~LOC~~ SOLN
0.0000 [IU] | Freq: Three times a day (TID) | SUBCUTANEOUS | Status: DC
  Administered 2016-11-05 (×3): 3 [IU] via SUBCUTANEOUS
  Administered 2016-11-06: 2 [IU] via SUBCUTANEOUS
  Administered 2016-11-06: 8 [IU] via SUBCUTANEOUS
  Administered 2016-11-06 – 2016-11-07 (×2): 3 [IU] via SUBCUTANEOUS
  Administered 2016-11-07: 5 [IU] via SUBCUTANEOUS
  Administered 2016-11-08: 3 [IU] via SUBCUTANEOUS
  Administered 2016-11-08: 2 [IU] via SUBCUTANEOUS
  Administered 2016-11-08: 8 [IU] via SUBCUTANEOUS
  Administered 2016-11-09 (×2): 2 [IU] via SUBCUTANEOUS
  Administered 2016-11-09: 3 [IU] via SUBCUTANEOUS
  Filled 2016-11-04 (×2): qty 3
  Filled 2016-11-04: qty 2
  Filled 2016-11-04: qty 8
  Filled 2016-11-04: qty 2
  Filled 2016-11-04 (×4): qty 3
  Filled 2016-11-04: qty 2
  Filled 2016-11-04: qty 3
  Filled 2016-11-04: qty 2
  Filled 2016-11-04: qty 5
  Filled 2016-11-04: qty 3
  Filled 2016-11-04: qty 1

## 2016-11-04 MED ORDER — MAGNESIUM HYDROXIDE 400 MG/5ML PO SUSP: 30.0000 mL | Freq: Every day | ORAL | Status: DC | PRN

## 2016-11-04 MED ORDER — IBUPROFEN 600 MG PO TABS
600.0000 mg | ORAL_TABLET | Freq: Once | ORAL | Status: AC
  Administered 2016-11-04: 600 mg via ORAL
  Filled 2016-11-04: qty 1

## 2016-11-04 MED ORDER — INSULIN ASPART 100 UNIT/ML ~~LOC~~ SOLN
0.0000 [IU] | Freq: Every day | SUBCUTANEOUS | Status: DC
  Administered 2016-11-07: 2 [IU] via SUBCUTANEOUS
  Filled 2016-11-04: qty 2

## 2016-11-04 NOTE — Tx Team (Signed)
Initial Treatment Plan 11/04/2016 1:00 PM [name] FRT:[id number]    PATIENT STRESSORS: Financial difficulties Marital or family conflict Substance abuse   PATIENT STRENGTHS: Capable of independent living Motivation for treatment/growth Physical Health   PATIENT IDENTIFIED PROBLEMS:   "I just want to die in my sleep"-suicidal ideation    homeless    depressed           DISCHARGE CRITERIA:  Adequate post-discharge living arrangements Improved stabilization in mood, thinking, and/or behavior Motivation to continue treatment in a less acute level of care Need for constant or close observation no longer present Verbal commitment to aftercare and medication compliance  PRELIMINARY DISCHARGE PLAN: Attend aftercare/continuing care group Outpatient therapy Placement in alternative living arrangements  PATIENT/FAMILY INVOLVEMENT: This treatment plan has been presented to and reviewed with the patient, Edgar White.  The patient and family have been given the opportunity to ask questions and make suggestions.  Delfin Edis, RN 11/04/2016, 1:00 PM

## 2016-11-04 NOTE — ED Notes (Signed)
Hourly rounding reveals patient sleeping in room. No complaints, stable, in no acute distress. Q15 minute rounds and monitoring via Security Cameras to continue. 

## 2016-11-04 NOTE — BHH Counselor (Signed)
Adult Comprehensive Assessment  Patient ID: Edgar White, male   DOB: May 31, 1966, 51 y.o.   MRN: [id number]  Information Source:  Current Stressors:  Educational / Learning stressors: None reported  Employment / Job issues: Currently unemployed  Family Relationships: Relationships with his siblings are Administrator, sports / Lack of resources (include bankruptcy): Limited resources  Housing / Lack of housing: Homeless, staying with various family members  Physical health (include injuries & life threatening diseases): Diabetes  Social relationships: None reported  Substance abuse: THC use daily, occasional cocaine use  Bereavement / Loss: Father died in November 24, 2012, mother died in 2014-11-25, break up in April 26, 2016  Living/Environment/Situation:  Living Arrangements: Other relatives Living conditions (as described by patient or guardian): Pt is currently living with his brother in [place]  How long has patient lived in current situation?: A few months  What is atmosphere in current home: Comfortable, Temporary  Family History:  Marital status: Single (Pt went through a break up in 26-Apr-2016. He is still recovering from this.) Are you sexually active?: No What is your sexual orientation?: Gay Has your sexual activity been affected by drugs, alcohol, medication, or emotional stress?: Pt states he is "not able to perform" due to medications and stress  Does patient have children?: No  Childhood History:  By whom was/is the patient raised?: Both parents Description of patient's relationship with caregiver when they were a child: Close with parents, closer with his mother  Patient's description of current relationship with people who raised him/her: Parents are both deceased How were you disciplined when you got in trouble as a child/adolescent?: Pt reports he was very spoiled as a child and was not disciplined often. Does patient have siblings?: Yes Number of Siblings: 3 (1 brother, 2  sisters) Description of patient's current relationship with siblings: Pt had a conflictual relationship with his siblings after the death of his mother. However, recently their relationship has improved and he is getting close with them again. 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 Currently a student?: No Learning disability?: No  Employment/Work Situation:   Employment situation: Unemployed Patient's job has been impacted by current illness:  (NA) What is the longest time patient has a held a job?: 7 years Where was the patient employed at that time?: Therapist, 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 Has patient ever been in the [employer]?: No Has patient ever served in combat?: No Did You Receive Any Psychiatric Treatment/Services While in Passenger transport manager?: No  Financial Resources:   Financial resources: No income Does patient have a Programmer, applications or guardian?: No  Alcohol/Substance Abuse:   What has been your use of drugs/alcohol within the last 12 months?: THC use daily, occasional cocaine use Alcohol/Substance Abuse Treatment Hx: Denies past history Has alcohol/substance abuse ever caused legal problems?: No  Social Support System:   Heritage manager System: Good Describe Community Support System: Friends, family "I have a lot of people that worry about me." Type of faith/religion: Darrick Meigs How does patient's faith help to cope with current illness?: Pt goes to church on a  regualr basis   Leisure/Recreation:   Leisure  and Hobbies: Exercise, walking dogs, spending time with friends   Strengths/Needs:   What things does the patient do well?: Painting, decorating, cleaning. cooking, making people laugh In what areas does patient struggle / problems for patient: Getting on the right meds, "getting back out into the world", paranoia  Discharge Plan:   Does patient have access to transportation?: Yes (Family will transport ) Will patient be returning to same living situation after discharge?: Yes Currently receiving community mental health services: Yes (From Whom) ([facility] but has not been since Dec) Does patient have financial barriers related to discharge medications?: Yes Patient description of barriers related to discharge medications: Limited resources   Summary/Recommendations:    Patient is a 51 yo male who presented to the hospital with depression and SI. Pt reports his primary triggers for admission include the death of his mother, financial concerns, housing concerns, and a recent break up in August of 2017. Pt was seen on an outpatient basis at [facility] and is agreeable to retuning to them for services. Pt's supports include his siblings, and his friends. Patient will benefit from crisis stabilization, medication evaluation, group therapy and pyschoeducation, in addition to case management for discharge planning. At discharge, it is recommended that pt remain compliant with the established discharge plan and continue treatment.  Glorious Peach, MSW, LCSW-A 11/04/2016, 3:06PM

## 2016-11-04 NOTE — ED Notes (Signed)
Pt being transferred to [facility] for a higher level of care to address his psychiatric needs. He is IVC, but did verbalize agreement with hospitalization. He continues to be depressed and has had intermittent episodes of crying. He left in no acute distress. His belongings were transferred with him to BMU. He was transported by Customer service manager with [facility].

## 2016-11-04 NOTE — BHH Suicide Risk Assessment (Signed)
[facility] Admission Suicide Risk Assessment   Nursing information obtained from:    Demographic factors:    Current Mental Status:    Loss Factors:    Historical Factors:    Risk Reduction Factors:     Total Time spent with patient: 1 hour Principal Problem: <principal problem not specified> Diagnosis:   Patient Active Problem List   Diagnosis Date Noted  . Cannabis use disorder, moderate, dependence ([place]) [F12.20] 11/04/2016  . Tobacco use disorder [F17.200] 11/04/2016  . Severe recurrent major depression without psychotic features ([place]) [F33.2] 11/04/2016  . Substance induced mood disorder ([place]) [F19.94] 10/13/2016  . Diabetes mellitus without complication ([place]) [K59.9] 06/08/2016  . Anxiety state [F41.1]   . Polysubstance abuse [F19.10]   . Major depressive disorder, recurrent severe without psychotic features ([place]) [F33.2] 04/13/2016   Subjective Data: suicidal ideation.  Continued Clinical Symptoms:    The "Alcohol Use Disorders Identification Test", Guidelines for Use in Primary Care, Second Edition.  World Pharmacologist [facility]). Score between 0-7:  no or low risk or alcohol related problems. Score between 8-15:  moderate risk of alcohol related problems. Score between 16-19:  high risk of alcohol related problems. Score 20 or above:  warrants further diagnostic evaluation for alcohol dependence and treatment.   CLINICAL FACTORS:   Depression:   Comorbid alcohol abuse/dependence Impulsivity Alcohol/Substance Abuse/Dependencies   Musculoskeletal: Strength & Muscle Tone: within normal limits Gait & Station: normal Patient leans: N/A  Psychiatric Specialty Exam: Physical Exam  Nursing note and vitals reviewed. Psychiatric: His speech is normal and behavior is normal. Cognition and memory are normal. He expresses impulsivity. He exhibits a depressed mood. He expresses suicidal ideation. He expresses suicidal plans.    Review of Systems  Psychiatric/Behavioral:  Positive for depression, substance abuse and suicidal ideas. The patient has insomnia.   All other systems reviewed and are negative.   There were no vitals taken for this visit.There is no height or weight on file to calculate BMI.  General Appearance: Casual  Eye Contact:  Good  Speech:  Clear and Coherent  Volume:  Normal  Mood:  Depressed and Hopeless  Affect:  Blunt  Thought Process:  Goal Directed and Descriptions of Associations: Intact  Orientation:  Full (Time, Place, and Person)  Thought Content:  WDL  Suicidal Thoughts:  Yes.  with intent/plan  Homicidal Thoughts:  No  Memory:  Immediate;   Fair Recent;   Fair Remote;   Fair  Judgement:  Impaired  Insight:  Shallow  Psychomotor Activity:  Normal  Concentration:  Concentration: Fair and Attention Span: Fair  Recall:  [employer] of Knowledge:  Fair  Language:  Fair  Akathisia:  No  Handed:  Right  AIMS (if indicated):     Assets:  Communication Skills Desire for Improvement Physical Health Resilience Social Support  ADL's:  Intact  Cognition:  WNL  Sleep:         COGNITIVE FEATURES THAT CONTRIBUTE TO RISK:  None    SUICIDE RISK:   Moderate:  Frequent suicidal ideation with limited intensity, and duration, some specificity in terms of plans, no associated intent, good self-control, limited dysphoria/symptomatology, some risk factors present, and identifiable protective factors, including available and accessible social support.  PLAN OF CARE: Hospital admission, medication management, substance abuse counseling, discharge planning.  Edgar White is a 51 year old male with a history of depression admitted for suicidal ideation.  1. Suicidal ideation. The patient is able to contract for safety.  2. Mood.  He was started on Pristiq in the emergency room.  3. Diabetes. He is on metformin, ADA diet and blood glucose monitoring.  4. Anxiety. Vistaril is available.  5. Insomnia. Trazodone is available.  6.  Metabolic syndrome monitoring. Hemoglobin done 3 weeks ago was 8.3. Lipid panel and TSH are pending.  7. EKG. Pending.  8. Smoking. Nicotine patch is available.   9. Substance abuse. The patient was positive for cannabis on admission. He has long history of substance abuse. He minimizes his problems and declines treatment.   10. Infection. He is on penicillin for 39 doses.   11. Disposition. To be established. The patient is homeless.  I certify that inpatient services furnished can reasonably be expected to improve the patient's condition.   Orson Slick, MD 11/04/2016, 12:26 PM

## 2016-11-04 NOTE — ED Notes (Signed)
Resting in bed on approach. Opened eyes spontaneously to name. Appeared flat initially but brightened in conversation. States he is aware of pending transfer and is agreeable. He denies SI/HI/AVH and verbally contracts for safety. He denies pain/discomfort. Was observed crying on bedside by rounding RN but he refused assistance. Support and encouragement provided. Safety has been maintained with q15 min obs, ods obs by camera and hourly rounding. Will continue current POC pending disposition.

## 2016-11-04 NOTE — ED Notes (Signed)
Per Dr. Reita Cliche, pt is ready for transfer to [facility]

## 2016-11-04 NOTE — BHH Counselor (Signed)
Patient is to be admitted to [facility] by Dr. Weber Cooks Attending Physician will be Dr. Bary Leriche. Patient has been assigned to room 303-B by [facility] Charge Nurse [place] Patient access aware of admission Call report to [phone]

## 2016-11-04 NOTE — Progress Notes (Signed)
NUTRITION ASSESSMENT  Pt identified as at risk on the Malnutrition Screen Tool  INTERVENTION: 1. Educated patient on the importance of nutrition and encouraged intake of food and beverages. 2. Discussed weight goals. 3. Supplements: Ensure Enlive po BID, each supplement provides 350 kcal and 20 grams of protein  NUTRITION DIAGNOSIS: Unintentional weight loss related to sub-optimal intake as evidenced by pt report.   Goal: Pt to meet >/= 90% of their estimated nutrition needs.  Monitor:  PO intake  Assessment:  51 y.o. male with PMHx of DM and depression.  Patient reports his appetite has been poor for the past 2 weeks. He reports it has worsened over the past 2-3 days because he has a very sore tooth (upper right molar in back of mouth) that makes it difficult to eat. Patient reports he attempts to eat 2 meals per day, but sometimes can only have one and is not able to finish meals lately. Reports typical intake is chicken, hamburger, beef, or pasta. Reports UBW a few years ago was 183 lbs. He reports he has lost 8 lbs (4.6% body weight) over the past two weeks, which is significant for time frame. RD offered to provide chopped food due to difficulty chewing, but patient reports he wants to try to eat with the regular texture diet first since he is able to get acetaminophen PRN for tooth pain.   Height: Ht Readings from Last 1 Encounters:  11/04/16 5\' 10"  (1.778 m)    Weight: Wt Readings from Last 1 Encounters:  11/04/16 167 lb (75.8 kg)    Weight Hx: Wt Readings from Last 10 Encounters:  11/04/16 167 lb (75.8 kg)  11/03/16 174 lb (78.9 kg)  10/13/16 172 lb (78 kg)  10/12/16 179 lb (81.2 kg)  06/08/16 168 lb (76.2 kg)  04/13/16 158 lb (71.7 kg)    BMI:  Body mass index is 23.96 kg/m. Pt meets criteria for normal weight based on current BMI.  Estimated Nutritional Needs: Kcal: 25-30 kcal/kg Protein: > 1 gram protein/kg Fluid: 1 ml/kcal  Diet Order: Diet Carb  Modified Fluid consistency: Thin; Room service appropriate? Yes Pt is also offered choice of unit snacks mid-morning and mid-afternoon.  Pt is eating as desired.   Lab results and medications reviewed.   Willey Blade, MS, RD, LDN Pager: [phone] After Hours Pager: [phone]

## 2016-11-04 NOTE — H&P (Signed)
Psychiatric Admission Assessment Adult  Patient Identification: Edgar White MRN:  [id number] Date of Evaluation:  11/04/2016 Chief Complaint:  Depression Principal Diagnosis: Major depressive disorder, recurrent severe without psychotic features ([place]) Diagnosis:   Patient Active Problem List   Diagnosis Date Noted  . Cannabis use disorder, moderate, dependence ([place]) [F12.20] 11/04/2016  . Tobacco use disorder [F17.200] 11/04/2016  . Severe recurrent major depression without psychotic features ([place]) [F33.2] 11/04/2016  . Substance induced mood disorder ([place]) [F19.94] 10/13/2016  . Diabetes mellitus without complication ([place]) [W26.3] 06/08/2016  . Anxiety state [F41.1]   . Polysubstance abuse [F19.10]   . Major depressive disorder, recurrent severe without psychotic features ([place]) [F33.2] 04/13/2016   History of Present Illness:   Identifying data. Edgar White is a 51 year old male with a history of depression.  Chief complaint. "We did not have a good discharge plan."  History of present illness. Information was obtained from the patient and the chart. Edgar White was hospitalized at [facility] at the end of February. He was discharged with a friend on Effexor. He returned to the Emergency room complainng of severe depression and suicidal ideation. He feels that Effexor has not been helpful. He claims to be compliant with treatment. He had an argument with his "friend" and was asked to leave. He is homeles. He reports many symptoms of depression with poor sleep, decreased appetite, anhedonia, feeling of hoplesness worthlesness and guilt, social isolation, crying spells, heightened anxiety and now suicidal thinking with a plan to overdose. He now reports symptoms of PTSD with nightmares and flashbacks from stress related to his parents loss. He denies psychotic symptoms or symptoms suggestive of bipolar mania. He denies alcohol or illicit substance use since discharge but has a history of  alcohol, cocaine and cannabis. He was positive for cannabis on admission.  Past psychiatric history. He had several psychiatric hospitalizations at [facility] in 2012, 2013 and 2014 that include ECT treatment. He had two recent admissions to [facility] in 03/2016 and 09/2016 for depression and substance use. He has two suicide attempts by overdose and hanging in 2014. His major stressor now is conflict with his family about inheritance following his mother's death. He has been tried on multiple medications including Celexa, Zoloft, Effexor, Pristiq and Zyprexa. He found Pristiq most useful.   Family psychiatric history. Multiple family members with depression and substance abuse.   Social history. The patient has a college degree but has not been able to maintain employment. 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 His father also died during that period of time. After her death his family sold her hose and he was asked to leave. He received approximately $38K and moved to [place]. He became involved in a relationship, and ended up spending all the money in 7 months "partying". He took his family to court over the inheritance which created more conflict. Prior to admission to [facility], he had been staying with his brother. It still seems to be an option.   Total Time spent with patient: 1 hour  Is the patient at risk to self? Yes.    Has the patient been a risk to self in the past 6 months? Yes.    Has the patient been a risk to self within the distant past? Yes.    Is the patient a risk to others? No.  Has the patient been a risk to others in the past 6 months? No.  Has the  patient been a risk to others within the distant past? No.   Prior Inpatient Therapy:   Prior Outpatient Therapy:    Alcohol Screening: 1. How often do you have a drink containing alcohol?: Monthly or less 2. How many drinks containing alcohol do you have on a typical day when you are drinking?: 3  or 4 3. How often do you have six or more drinks on one occasion?: Less than monthly Preliminary Score: 2 4. How often during the last year have you found that you were not able to stop drinking once you had started?: Never 5. How often during the last year have you failed to do what was normally expected from you becasue of drinking?: Never 6. How often during the last year have you needed a first drink in the morning to get yourself going after a heavy drinking session?: Never 7. How often during the last year have you had a feeling of guilt of remorse after drinking?: Never 8. How often during the last year have you been unable to remember what happened the night before because you had been drinking?: Never 9. Have you or someone else been injured as a result of your drinking?: No 10. Has a relative or friend or a doctor or another health worker been concerned about your drinking or suggested you cut down?: No Alcohol Use Disorder Identification Test Final Score (AUDIT): 3 Brief Intervention: AUDIT score less than 7 or less-screening does not suggest unhealthy drinking-brief intervention not indicated Substance Abuse History in the last 12 months:  Yes.   Consequences of Substance Abuse: Negative Previous Psychotropic Medications: Yes  Psychological Evaluations: No  Past Medical History:  Past Medical History:  Diagnosis Date  . Depression   . Diabetes mellitus without complication [facility])     Past Surgical History:  Procedure Laterality Date  . INGUINAL HERNIA REPAIR    . LUMBAR FUSION     Family History: History reviewed. No pertinent family history.  Tobacco Screening: Have you used any form of tobacco in the last 30 days? (Cigarettes, Smokeless Tobacco, Cigars, and/or Pipes): Yes Tobacco use, Select all that apply: 4 or less cigarettes per day Are you interested in Tobacco Cessation Medications?: Yes, will notify MD for an order Counseled patient on smoking cessation including  recognizing danger situations, developing coping skills and basic information about quitting provided: Yes Social History:  History  Alcohol Use No    Comment: denies use     History  Drug Use  . Types: Marijuana, Cocaine    Comment: Last used in february 2018    Additional Social History:      History of alcohol / drug use?: Yes Name of Substance 1: marijuana 1 - Last Use / Amount: UDS positive, but pt reports last use in february 2018 Name of Substance 2: Cocaine  2 - Last Use / Amount: UDS negative, reports last use in february 2018                Allergies:  No Known Allergies Lab Results:  Results for orders placed or performed during the hospital encounter of 11/04/16 (from the past 48 hour(s))  Urine Drug Screen, Qualitative     Status: Abnormal   Collection Time: 11/04/16  3:10 PM  Result Value Ref Range   Tricyclic, Ur Screen NONE DETECTED NONE DETECTED   Amphetamines, Ur Screen NONE DETECTED NONE DETECTED   MDMA (Ecstasy)Ur Screen NONE DETECTED NONE DETECTED   Cocaine Metabolite,Ur [place] NONE DETECTED  NONE DETECTED   Opiate, Ur Screen NONE DETECTED NONE DETECTED   Phencyclidine (PCP) Ur S NONE DETECTED NONE DETECTED   Cannabinoid 50 Ng, Ur [place] POSITIVE (A) NONE DETECTED   Barbiturates, Ur Screen NONE DETECTED NONE DETECTED   Benzodiazepine, Ur Scrn NONE DETECTED NONE DETECTED   Methadone Scn, Ur NONE DETECTED NONE DETECTED    Comment: (NOTE) 335  Tricyclics, urine               Cutoff 1000 ng/mL 200  Amphetamines, urine             Cutoff 1000 ng/mL 300  MDMA (Ecstasy), urine           Cutoff 500 ng/mL 400  Cocaine Metabolite, urine       Cutoff 300 ng/mL 500  Opiate, urine                   Cutoff 300 ng/mL 600  Phencyclidine (PCP), urine      Cutoff 25 ng/mL 700  Cannabinoid, urine              Cutoff 50 ng/mL 800  Barbiturates, urine             Cutoff 200 ng/mL 900  Benzodiazepine, urine           Cutoff 200 ng/mL 1000 Methadone, urine                 Cutoff 300 ng/mL 1100 1200 The urine drug screen provides only a preliminary, unconfirmed 1300 analytical test result and should not be used for non-medical 1400 purposes. Clinical consideration and professional judgment should 1500 be applied to any positive drug screen result due to possible 1600 interfering substances. A more specific alternate chemical method 1700 must be used in order to obtain a confirmed analytical result.  1800 Gas chromato graphy / mass spectrometry (GC/MS) is the preferred 1900 confirmatory method.   Glucose, capillary     Status: Abnormal   Collection Time: 11/04/16  4:11 PM  Result Value Ref Range   Glucose-Capillary 234 (H) 65 - 99 mg/dL   Comment 1 Notify RN     Blood Alcohol level:  Lab Results  Component Value Date   ETH <5 11/03/2016   ETH <5 45/62/5638    Metabolic Disorder Labs:  Lab Results  Component Value Date   HGBA1C 8.3 (H) 10/13/2016   MPG 192 10/13/2016   MPG 140 04/15/2016   No results found for: PROLACTIN Lab Results  Component Value Date   CHOL 207 (H) 04/15/2016   TRIG 174 (H) 04/15/2016   HDL 34 (L) 04/15/2016   CHOLHDL 6.1 04/15/2016   VLDL 35 04/15/2016   LDLCALC 138 (H) 04/15/2016   LDLCALC SEE COMMENT 02/09/2012    Current Medications: Current Facility-Administered Medications  Medication Dose Route Frequency Provider Last Rate Last Dose  . acetaminophen (TYLENOL) tablet 650 mg  650 mg Oral Q6H PRN Gonzella Lex, MD      . alum & mag hydroxide-simeth (MAALOX/MYLANTA) 200-200-20 MG/5ML suspension 30 mL  30 mL Oral Q4H PRN Gonzella Lex, MD      . Derrill Memo ON 11/05/2016] desvenlafaxine (PRISTIQ) 24 hr tablet 50 mg  50 mg Oral Daily Starlena Beil B Dellanira Dillow, MD      . Derrill Memo ON 11/05/2016] feeding supplement (ENSURE ENLIVE) (ENSURE ENLIVE) liquid 237 mL  237 mL Oral BID BM Eleuterio Dollar B Baillie Mohammad, MD      . hydrOXYzine (ATARAX/VISTARIL) tablet 50 mg  50 mg Oral TID PRN Clovis Fredrickson, MD   50 mg at 11/04/16 2132  .  ibuprofen (ADVIL,MOTRIN) tablet 800 mg  800 mg Oral Q6H PRN Clovis Fredrickson, MD   800 mg at 11/04/16 2131  . [START ON 11/05/2016] insulin aspart (novoLOG) injection 0-15 Units  0-15 Units Subcutaneous TID WC Raymar Joiner B Kymari Nuon, MD      . insulin aspart (novoLOG) injection 0-5 Units  0-5 Units Subcutaneous QHS Raman Featherston B Lakaya Tolen, MD      . magnesium hydroxide (MILK OF MAGNESIA) suspension 30 mL  30 mL Oral Daily PRN Gonzella Lex, MD      . metFORMIN (GLUCOPHAGE) tablet 500 mg  500 mg Oral BID WC Gonzella Lex, MD   500 mg at 11/04/16 1718  . nicotine (NICODERM CQ - dosed in mg/24 hours) patch 21 mg  21 mg Transdermal Daily Arneda Sappington B Adrinne Sze, MD   21 mg at 11/04/16 1528  . penicillin v potassium (VEETID) tablet 500 mg  500 mg Oral Q6H Gonzella Lex, MD   500 mg at 11/04/16 1718  . traZODone (DESYREL) tablet 100 mg  100 mg Oral QHS PRN Gonzella Lex, MD   100 mg at 11/04/16 2131   PTA Medications: Prescriptions Prior to Admission  Medication Sig Dispense Refill Last Dose  . amoxicillin (AMOXIL) 500 MG capsule Take 1 capsule (500 mg total) by mouth every 8 (eight) hours. For tooth infection (Patient not taking: Reported on 11/04/2016) 11 capsule 0 Completed Course at Unknown time  . benzocaine (ORAJEL) 10 % mucosal gel Use as directed in the mouth or throat 3 (three) times daily as needed for mouth pain. 5.3 g 0   . hydrOXYzine (ATARAX/VISTARIL) 25 MG tablet Take 1 tablet (25 mg total) by mouth every 6 (six) hours as needed for anxiety. 60 tablet 0   . metFORMIN (GLUCOPHAGE) 500 MG tablet Take 1 tablet (500 mg total) by mouth 2 (two) times daily with a meal. For diabetes management 60 tablet 0   . nicotine (NICODERM CQ - DOSED IN MG/24 HOURS) 21 mg/24hr patch Place 1 patch (21 mg total) onto the skin daily. For smoking cessation 28 patch 0   . traZODone (DESYREL) 50 MG tablet Take 1 tablet (50 mg total) by mouth at bedtime as needed for sleep. 30 tablet 0   . venlafaxine XR (EFFEXOR-XR)  150 MG 24 hr capsule Take 1 capsule (150 mg total) by mouth daily with breakfast. For depression 30 capsule 0     Musculoskeletal: Strength & Muscle Tone: within normal limits Gait & Station: normal Patient leans: N/A  Psychiatric Specialty Exam: I reviewed physical examination performed in the emergency room and agree with the findings. Physical Exam  Nursing note and vitals reviewed. Psychiatric: His speech is normal and behavior is normal. Cognition and memory are normal. He expresses impulsivity. He exhibits a depressed mood. He expresses suicidal ideation. He expresses suicidal plans.    Review of Systems  Psychiatric/Behavioral: Positive for depression, substance abuse and suicidal ideas.  All other systems reviewed and are negative.   Blood pressure 118/62, pulse 85, temperature 98.1 F (36.7 C), temperature source Oral, resp. rate 16, height 5\' 10"  (1.778 m), weight 75.8 kg (167 lb), SpO2 100 %.Body mass index is 23.96 kg/m.  See SRA.  Sleep:       Treatment Plan Summary: Daily contact with patient to assess and evaluate symptoms and progress in treatment and Medication management   Edgar White is a 51 year old male with a history of depression admitted for suicidal ideation.  1. Suicidal ideation. The patient is able to contract for safety.  2. Mood. He was started on Pristiq in the emergency room.  3. Diabetes. He is on metformin, ADA diet and blood glucose monitoring.  4. Anxiety. Vistaril is available.  5. Insomnia. Trazodone is available.  6. Metabolic syndrome monitoring. Hemoglobin done 3 weeks ago was 8.3. Lipid panel and TSH are pending.  7. EKG. Pending.  8. Smoking. Nicotine patch is available.   9. Substance abuse. The patient was positive for cannabis on admission. He has long history of substance abuse. He minimizes his problems and declines treatment.   10. Tooth infection. He  is on penicillin for 39 doses. Will offer Ibuprofen for pain.  11. Disposition. To be established. The patient is homeless.   Observation Level/Precautions:  15 minute checks  Laboratory:  CBC Chemistry Profile UDS UA  Psychotherapy:    Medications:    Consultations:    Discharge Concerns:    Estimated LOS:  Other:     Physician Treatment Plan for Primary Diagnosis: Major depressive disorder, recurrent severe without psychotic features ([place]) Long Term Goal(s): Improvement in symptoms so as ready for discharge  Short Term Goals: Ability to identify changes in lifestyle to reduce recurrence of condition will improve, Ability to verbalize feelings will improve, Ability to disclose and discuss suicidal ideas, Ability to demonstrate self-control will improve, Ability to identify and develop effective coping behaviors will improve, Ability to maintain clinical measurements within normal limits will improve, Compliance with prescribed medications will improve and Ability to identify triggers associated with substance abuse/mental health issues will improve  Physician Treatment Plan for Secondary Diagnosis: Principal Problem:   Major depressive disorder, recurrent severe without psychotic features ([place]) Active Problems:   Diabetes mellitus without complication ([place])   Cannabis use disorder, moderate, dependence ([place])   Tobacco use disorder   Severe recurrent major depression without psychotic features ([place])  Long Term Goal(s): Improvement in symptoms so as ready for discharge  Short Term Goals: Ability to identify changes in lifestyle to reduce recurrence of condition will improve, Ability to demonstrate self-control will improve and Ability to identify triggers associated with substance abuse/mental health issues will improve  I certify that inpatient services furnished can reasonably be expected to improve the patient's condition.    Orson Slick, MD 3/16/201810:49 PM

## 2016-11-04 NOTE — [facility] Suicide Risk Assessment (Signed)
[facility] INPATIENT:  Family/Significant Other Suicide Prevention Education  Suicide Prevention Education:  Education Completed; sister, Edgar White ph#: [phone] has been identified by the patient as the family member/significant other with whom the patient will be residing, and identified as the person(s) who will aid the patient in the event of a mental health crisis (suicidal ideations/suicide attempt).  With written consent from the patient, the family member/significant other has been provided the following suicide prevention education, prior to the and/or following the discharge of the patient.  The suicide prevention education provided includes the following:  Suicide risk factors  Suicide prevention and interventions  National Suicide Hotline telephone number  [facility] assessment telephone number  [facility] Emergency Assistance [place] and/or Residential Mobile Crisis Unit telephone number  Request made of family/significant other to:  Remove weapons (e.g., guns, rifles, knives), all items previously/currently identified as safety concern.    Remove drugs/medications (over-the-counter, prescriptions, illicit drugs), all items previously/currently identified as a safety concern.  The family member/significant other verbalizes understanding of the suicide prevention education information provided.  The family member/significant other agrees to remove the items of safety concern listed above.  Emilie Rutter, MSW, LCSW-A 11/04/2016, 3:19 PM

## 2016-11-04 NOTE — ED Notes (Signed)
Hourly rounding reveals patient sleeping n room. No complaints, stable, in no acute distress. Q15 minute rounds and monitoring via Security Cameras to continue. 

## 2016-11-05 LAB — LIPID PANEL
Cholesterol: 210 mg/dL — ABNORMAL HIGH (ref 0–200)
HDL: 23 mg/dL — AB (ref >40)
LDL CALC: 153 mg/dL — AB (ref 0–99)
TRIGLYCERIDES: 170 mg/dL — AB (ref <150)
Total CHOL/HDL Ratio: 9.1 RATIO
VLDL: 34 mg/dL (ref 0–40)

## 2016-11-05 LAB — GLUCOSE, CAPILLARY
GLUCOSE-CAPILLARY: 179 mg/dL — AB (ref 65–99)
GLUCOSE-CAPILLARY: 159 mg/dL — AB (ref 65–99)
Glucose-Capillary: 129 mg/dL — ABNORMAL HIGH (ref 65–99)
Glucose-Capillary: 177 mg/dL — ABNORMAL HIGH (ref 65–99)
Glucose-Capillary: 189 mg/dL — ABNORMAL HIGH (ref 65–99)

## 2016-11-05 LAB — TSH: TSH: 2.755 u[IU]/mL (ref 0.350–4.500)

## 2016-11-05 MED ORDER — TRAZODONE HCL 50 MG PO TABS
ORAL_TABLET | ORAL | Status: AC
  Filled 2016-11-05: qty 2

## 2016-11-05 NOTE — Plan of Care (Signed)
Problem: Coping: Goal: Ability to verbalize feelings will improve Outcome: Progressing Pt is able to verbalize his feelings, continues to endorse passive SI without a specific plan. Requested to speak with chaplain today. Support and encouragement provided.

## 2016-11-05 NOTE — Progress Notes (Signed)
Pt awake, alert, oriented and up on unit today. Appropriately interacts with staff and peers. Attends group. Reports poor sleep last night due to having nightmares that picked right back up when he would wake then go back to sleep during the night. Pt complained of feeling tired/sleepy after breakfast and went back to his room to sleep later in the morning. This nurse checked in on pt to find him in his bed with eyes closed, appeared asleep, but was whimpering/crying and his body was twitching while sleeping as if he were dreaming again. Nurse woke pt from his dream, pt stated he dreamed he was "here in the hospital and all of yall were having to hold me down and carry me down the hall because I was agitated or something." Oriented pt back to reality, provided support and encouragement. Pt then went to dayroom instead of going back to sleep. Later on, pt noted to be walking to his room crying/sobbing. Nurse approached pt and he began to speak about recent life events. Voiced since his mother died, he experiences hopelessness, unable to look toward the future, states "I'm just stuck in the past and what has happened." Reports after his parents died with his mom dying most recently, his siblings only shared 10% of his mother's inheritance with him although he states it should have been 25%. Pt currently has a lawsuit against his siblings regarding this matter. Pt also worried about where he will be going to live once discharged, states "I refuse to go to a homeless shelter." Reports his brother would allow him back to his home ONLY IF he got a job. Pt reports he is unable to keep a job due to lack of transportation. Pt also reports he spent $35,000 of the inheritance from his mother in less than 7 months, which left him without everything he had previously worked for. Pt reports fair appetite, low energy, poor concentration. Rates depression 7/10, hopelessness 8/10, anxiety 7/10 (low 0-10 high). Pt takes PRN pain  medication for toothache with relief, and PRN anxiety medication as prescribed. Goal today is "be more alert in the moment" by "stay positive and be active." Support and encouragement provided. Medications administered as ordered with education. Safety maintained with every 15 minute checks. Will continue to monitor.

## 2016-11-05 NOTE — Progress Notes (Signed)
Chaplain responded to an OR for Pt who requested Prayer in [place]. CH met with Pt whose consult stated he was experiencing "major life transition and suicidal thoughts." Pt was calm, oriented, and pleasant. Pt stated he was grieving the loss of his father who passed away 4 yrs. go, and mother who passed away recently. Pt noted that the loss of his mother devastated him the most. 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 After the death of mother, Pt was confused, he did not know what to do next, he stated. Pt's loss of father and mother, strained relationship with his siblings, emotionally and financially instability, all these things have exacerbated the Pt's anxiety and depression, he stated. Pt appears to be projecting blame on other family members instead of taking responsibility of his mistakes. [place] provided support and encouragement and asked the Pt to develop a plan on what he wants do post hospital. Pt agreed to do so. Pt stated he fantasizes with suicidal thoughts in the morning and in the evenings, but he has no specific plan to hurt himself. Pt requested prayers to regain his life and his self-esteem, which the [facility] provided.     11/05/16 2000  Clinical Encounter Type  Visited With Patient  Visit Type Initial  Referral From Nurse  Consult/Referral To Chaplain  Spiritual Encounters  Spiritual Needs Prayer;Other (Comment)

## 2016-11-05 NOTE — Progress Notes (Signed)
D: Pt denies SI/HI/AVH. Pt is pleasant and cooperative, affect is flat and sad but brightens upon approach. Pt appears less anxious and he is interacting with peers and staff appropriately.  A: Pt was offered support and encouragement. Pt was given scheduled medications. Pt was encouraged to attend groups. Q 15 minute checks were done for safety.  R:Pt attends groups and interacts well with peers and staff. Pt is taking medication. Pt has no complaints.Pt receptive to treatment and safety maintained on unit.

## 2016-11-05 NOTE — Progress Notes (Signed)
[facility] MD Progress Note  11/05/2016 3:37 PM Edgar White  MRN:  [id number] Subjective:  Patient with a history of major depression and anxiety and substance abuse admitted to the hospital with suicidal ideation and severe depression. Today the patient is neatly dressed and groomed. He is attending groups and behaving appropriately. In interview however he still describes his mood is very anxious and hopeless with suicidal thoughts. Feels very nervous about not having a place to stay or any good outpatient plan. Affect is dysphoric but reactive. No sign of psychosis. Tolerating medicinespecific new physical complaint Principal Problem: Major depressive disorder, recurrent severe without psychotic features ([place]) Diagnosis:   Patient Active Problem List   Diagnosis Date Noted  . Cannabis use disorder, moderate, dependence ([place]) [F12.20] 11/04/2016  . Tobacco use disorder [F17.200] 11/04/2016  . Severe recurrent major depression without psychotic features ([place]) [F33.2] 11/04/2016  . Substance induced mood disorder ([place]) [F19.94] 10/13/2016  . Diabetes mellitus without complication ([place]) [U13.2] 06/08/2016  . Anxiety state [F41.1]   . Polysubstance abuse [F19.10]   . Major depressive disorder, recurrent severe without psychotic features ([place]) [F33.2] 04/13/2016   Total Time spent with patient: 45 minutes  Past Psychiatric History: Past history of recurrent hospitalizations for severe major depression history of suicide attempts. History of some response to medicine in the past but also a major history of substance abuse problems  Past Medical History:  Past Medical History:  Diagnosis Date  . Depression   . Diabetes mellitus without complication [facility])     Past Surgical History:  Procedure Laterality Date  . INGUINAL HERNIA REPAIR    . LUMBAR FUSION     Family History: History reviewed. No pertinent family history. Family Psychiatric  History: Positive for substance abuse Social History:   History  Alcohol Use No    Comment: denies use     History  Drug Use  . Types: Marijuana, Cocaine    Comment: Last used in february 2018    Social History   Social History  . Marital status: Single    Spouse name: N/A  . Number of children: N/A  . Years of education: N/A   Social History Main Topics  . Smoking status: Current Every Day Smoker    Packs/day: 0.50    Types: Cigarettes  . Smokeless tobacco: Never Used  . Alcohol use No     Comment: denies use  . Drug use: Yes    Types: Marijuana, Cocaine     Comment: Last used in february 2018  . Sexual activity: No   Other Topics Concern  . None   Social History Narrative  . None   Additional Social History:    History of alcohol / drug use?: Yes Name of Substance 1: marijuana 1 - Last Use / Amount: UDS positive, but pt reports last use in february 2018 Name of Substance 2: Cocaine  2 - Last Use / Amount: UDS negative, reports last use in february 2018                Sleep: Fair  Appetite:  Fair  Current Medications: Current Facility-Administered Medications  Medication Dose Route Frequency Provider Last Rate Last Dose  . acetaminophen (TYLENOL) tablet 650 mg  650 mg Oral Q6H PRN Gonzella Lex, MD      . alum & mag hydroxide-simeth (MAALOX/MYLANTA) 200-200-20 MG/5ML suspension 30 mL  30 mL Oral Q4H PRN Gonzella Lex, MD      . desvenlafaxine (PRISTIQ) 24  hr tablet 50 mg  50 mg Oral Daily Clovis Fredrickson, MD   50 mg at 11/05/16 0905  . feeding supplement (ENSURE ENLIVE) (ENSURE ENLIVE) liquid 237 mL  237 mL Oral BID BM Jolanta B Pucilowska, MD   237 mL at 11/05/16 1400  . hydrOXYzine (ATARAX/VISTARIL) tablet 50 mg  50 mg Oral TID PRN Clovis Fredrickson, MD   50 mg at 11/05/16 1304  . ibuprofen (ADVIL,MOTRIN) tablet 800 mg  800 mg Oral Q6H PRN Jolanta B Pucilowska, MD   800 mg at 11/05/16 1147  . insulin aspart (novoLOG) injection 0-15 Units  0-15 Units Subcutaneous TID WC Clovis Fredrickson, MD    3 Units at 11/05/16 1146  . insulin aspart (novoLOG) injection 0-5 Units  0-5 Units Subcutaneous QHS Jolanta B Pucilowska, MD      . magnesium hydroxide (MILK OF MAGNESIA) suspension 30 mL  30 mL Oral Daily PRN Gonzella Lex, MD      . metFORMIN (GLUCOPHAGE) tablet 500 mg  500 mg Oral BID WC Gonzella Lex, MD   500 mg at 11/05/16 0905  . nicotine (NICODERM CQ - dosed in mg/24 hours) patch 21 mg  21 mg Transdermal Daily Clovis Fredrickson, MD   21 mg at 11/05/16 0906  . penicillin v potassium (VEETID) tablet 500 mg  500 mg Oral Q6H Gonzella Lex, MD   500 mg at 11/05/16 1146  . traZODone (DESYREL) tablet 100 mg  100 mg Oral QHS PRN Gonzella Lex, MD   100 mg at 11/04/16 2131    Lab Results:  Results for orders placed or performed during the hospital encounter of 11/04/16 (from the past 48 hour(s))  Urine Drug Screen, Qualitative     Status: Abnormal   Collection Time: 11/04/16  3:10 PM  Result Value Ref Range   Tricyclic, Ur Screen NONE DETECTED NONE DETECTED   Amphetamines, Ur Screen NONE DETECTED NONE DETECTED   MDMA (Ecstasy)Ur Screen NONE DETECTED NONE DETECTED   Cocaine Metabolite,Ur [place] NONE DETECTED NONE DETECTED   Opiate, Ur Screen NONE DETECTED NONE DETECTED   Phencyclidine (PCP) Ur S NONE DETECTED NONE DETECTED   Cannabinoid 50 Ng, Ur [place] POSITIVE (A) NONE DETECTED   Barbiturates, Ur Screen NONE DETECTED NONE DETECTED   Benzodiazepine, Ur Scrn NONE DETECTED NONE DETECTED   Methadone Scn, Ur NONE DETECTED NONE DETECTED    Comment: (NOTE) 622  Tricyclics, urine               Cutoff 1000 ng/mL 200  Amphetamines, urine             Cutoff 1000 ng/mL 300  MDMA (Ecstasy), urine           Cutoff 500 ng/mL 400  Cocaine Metabolite, urine       Cutoff 300 ng/mL 500  Opiate, urine                   Cutoff 300 ng/mL 600  Phencyclidine (PCP), urine      Cutoff 25 ng/mL 700  Cannabinoid, urine              Cutoff 50 ng/mL 800  Barbiturates, urine             Cutoff 200 ng/mL 900   Benzodiazepine, urine           Cutoff 200 ng/mL 1000 Methadone, urine                Cutoff  300 ng/mL 1100 1200 The urine drug screen provides only a preliminary, unconfirmed 1300 analytical test result and should not be used for non-medical 1400 purposes. Clinical consideration and professional judgment should 1500 be applied to any positive drug screen result due to possible 1600 interfering substances. A more specific alternate chemical method 1700 must be used in order to obtain a confirmed analytical result.  1800 Gas chromato graphy / mass spectrometry (GC/MS) is the preferred 1900 confirmatory method.   Glucose, capillary     Status: Abnormal   Collection Time: 11/04/16  4:11 PM  Result Value Ref Range   Glucose-Capillary 234 (H) 65 - 99 mg/dL   Comment 1 Notify RN   Glucose, capillary     Status: Abnormal   Collection Time: 11/05/16 12:43 AM  Result Value Ref Range   Glucose-Capillary 189 (H) 65 - 99 mg/dL  Glucose, capillary     Status: Abnormal   Collection Time: 11/05/16  7:00 AM  Result Value Ref Range   Glucose-Capillary 177 (H) 65 - 99 mg/dL  Lipid panel     Status: Abnormal   Collection Time: 11/05/16  7:07 AM  Result Value Ref Range   Cholesterol 210 (H) 0 - 200 mg/dL   Triglycerides 170 (H) <150 mg/dL   HDL 23 (L) >40 mg/dL   Total CHOL/HDL Ratio 9.1 RATIO   VLDL 34 0 - 40 mg/dL   LDL Cholesterol 153 (H) 0 - 99 mg/dL    Comment:        Total Cholesterol/HDL:CHD Risk Coronary Heart Disease Risk Table                     Men   Women  1/2 Average Risk   3.4   3.3  Average Risk       5.0   4.4  2 X Average Risk   9.6   7.1  3 X Average Risk  23.4   11.0        Use the calculated Patient Ratio above and the CHD Risk Table to determine the patient's CHD Risk.        ATP III CLASSIFICATION (LDL):  <100     mg/dL   Optimal  100-129  mg/dL   Near or Above                    Optimal  130-159  mg/dL   Borderline  160-189  mg/dL   High  >190     mg/dL    Very High   TSH     Status: None   Collection Time: 11/05/16  7:07 AM  Result Value Ref Range   TSH 2.755 0.350 - 4.500 uIU/mL    Comment: Performed by a 3rd Generation assay with a functional sensitivity of <=0.01 uIU/mL.  Glucose, capillary     Status: Abnormal   Collection Time: 11/05/16 11:44 AM  Result Value Ref Range   Glucose-Capillary 179 (H) 65 - 99 mg/dL    Blood Alcohol level:  Lab Results  Component Value Date   ETH <5 11/03/2016   ETH <5 01/77/9390    Metabolic Disorder Labs: Lab Results  Component Value Date   HGBA1C 8.3 (H) 10/13/2016   MPG 192 10/13/2016   MPG 140 04/15/2016   No results found for: PROLACTIN Lab Results  Component Value Date   CHOL 210 (H) 11/05/2016   TRIG 170 (H) 11/05/2016   HDL 23 (L) 11/05/2016   CHOLHDL 9.1  11/05/2016   VLDL 34 11/05/2016   LDLCALC 153 (H) 11/05/2016   LDLCALC 138 (H) 04/15/2016    Physical Findings: AIMS: Facial and Oral Movements Muscles of Facial Expression: None, normal Lips and Perioral Area: None, normal Jaw: None, normal Tongue: None, normal,Extremity Movements Upper (arms, wrists, hands, fingers): None, normal Lower (legs, knees, ankles, toes): None, normal, Trunk Movements Neck, shoulders, hips: None, normal, Overall Severity Severity of abnormal movements (highest score from questions above): None, normal Incapacitation due to abnormal movements: None, normal Patient's awareness of abnormal movements (rate only patient's report): No Awareness, Dental Status Current problems with teeth and/or dentures?: Yes (R upper molar loose, painful) Does patient usually wear dentures?: No  CIWA:    COWS:     Musculoskeletal: Strength & Muscle Tone: within normal limits Gait & Station: normal Patient leans: N/A  Psychiatric Specialty Exam: Physical Exam  Nursing note and vitals reviewed. Constitutional: He appears well-developed and well-nourished.  HENT:  Head: Normocephalic and atraumatic.  Eyes:  Conjunctivae are normal. Pupils are equal, round, and reactive to light.  Neck: Normal range of motion.  Cardiovascular: Regular rhythm and normal heart sounds.   Respiratory: Effort normal. No respiratory distress.  GI: Soft.  Musculoskeletal: Normal range of motion.  Neurological: He is alert.  Skin: Skin is warm and dry.  Psychiatric: His speech is delayed. He is slowed. Cognition and memory are normal. He expresses impulsivity. He exhibits a depressed mood. He expresses suicidal ideation. He expresses no suicidal plans.    Review of Systems  Constitutional: Negative.   HENT: Negative.   Eyes: Negative.   Respiratory: Negative.   Cardiovascular: Negative.   Gastrointestinal: Negative.   Musculoskeletal: Negative.   Skin: Negative.   Neurological: Negative.   Psychiatric/Behavioral: Positive for depression, substance abuse and suicidal ideas. Negative for hallucinations and memory loss. The patient is nervous/anxious and has insomnia.     Blood pressure 124/70, pulse 87, temperature 97.9 F (36.6 C), temperature source Oral, resp. rate 18, height 5\' 10"  (1.778 m), weight 75.8 kg (167 lb), SpO2 100 %.Body mass index is 23.96 kg/m.  General Appearance: Fairly Groomed  Eye Contact:  Good  Speech:  Normal Rate  Volume:  Normal  Mood:  Depressed  Affect:  Appropriate  Thought Process:  Goal Directed  Orientation:  Full (Time, Place, and Person)  Thought Content:  Logical  Suicidal Thoughts:  Yes.  without intent/plan  Homicidal Thoughts:  No  Memory:  Immediate;   Good Recent;   Good Remote;   Good  Judgement:  Fair  Insight:  Fair  Psychomotor Activity:  Normal  Concentration:  Concentration: Fair  Recall:  [place] of Knowledge:  Fair  Language:  Fair  Akathisia:  No  Handed:  Right  AIMS (if indicated):     Assets:  Communication Skills Desire for Improvement Physical Health Resilience  ADL's:  Intact  Cognition:  WNL  Sleep:        Treatment Plan  Summary: Daily contact with patient to assess and evaluate symptoms and progress in treatment, Medication management and Plan Patient is back on his antidepressant medicine as previously prescribed. He will be involved daily in group therapy. Daily supportive and educational counseling about substance abuse. Social work will work with him on possibilities for finding an appropriate place to stay at discharge. Case reviewed with nursing. Patient agrees to the plan.  Alethia Berthold, MD 11/05/2016, 3:37 PM

## 2016-11-05 NOTE — [facility] Group Notes (Signed)
[facility] LCSW Group Therapy  11/05/2016 2:54 PM  Type of Therapy:  Group Therapy  Participation Level:  Minimal  Participation Quality:  Attentive  Affect:  Depressed  Cognitive:  Alert  Insight:  Improving  Engagement in Therapy:  Improving  Modes of Intervention:  Discussion, Education, Reality Testing, Socialization and Support  Summary of Progress/Problems: Goal Setting: The objective is to set goals as they relate to the crisis in which they were admitted. Patients will be using SMART goal modalities to set measurable goals. Characteristics of realistic goals will be discussed and patients will be assisted in setting and processing how one will reach their goal. Facilitator will also assist patients in applying interventions and coping skills learned in psycho-education groups to the SMART goal and process how one will achieve defined goal.  Dominick Zertuche G. [place], [place] 11/05/2016, 2:56 PM

## 2016-11-06 LAB — GLUCOSE, CAPILLARY
GLUCOSE-CAPILLARY: 133 mg/dL — AB (ref 65–99)
GLUCOSE-CAPILLARY: 194 mg/dL — AB (ref 65–99)
Glucose-Capillary: 163 mg/dL — ABNORMAL HIGH (ref 65–99)
Glucose-Capillary: 269 mg/dL — ABNORMAL HIGH (ref 65–99)

## 2016-11-06 NOTE — Plan of Care (Signed)
Problem: Coping: Goal: Ability to cope will improve Outcome: Progressing Able to express concerns and interact appropriately with staff and peers. Encouraged to participate in unit activities.

## 2016-11-06 NOTE — Progress Notes (Signed)
[facility] MD Progress Note  11/06/2016 1:53 PM Edgar White  MRN:  [id number] Subjective:  Patient with a history of major depression and anxiety and substance abuse admitted to the hospital with suicidal ideation and severe depression. Today the patient is neatly dressed and groomed. He is attending groups and behaving appropriately. In interview however he still describes his mood is very anxious and hopeless with suicidal thoughts. Feels very nervous about not having a place to stay or any good outpatient plan. Affect is dysphoric but reactive. No sign of psychosis. Tolerating medicinespecific new physical complaint  Follow-up Sunday the 18th. Patient seen. His mood he says is a little bit better. Not having active thoughts of suicide. Slept a little better last night. He is neatly dressed and attends groups and interacts very appropriately. Principal Problem: Major depressive disorder, recurrent severe without psychotic features ([place]) Diagnosis:   Patient Active Problem List   Diagnosis Date Noted  . Cannabis use disorder, moderate, dependence ([place]) [F12.20] 11/04/2016  . Tobacco use disorder [F17.200] 11/04/2016  . Severe recurrent major depression without psychotic features ([place]) [F33.2] 11/04/2016  . Substance induced mood disorder ([place]) [F19.94] 10/13/2016  . Diabetes mellitus without complication ([place]) [N36.1] 06/08/2016  . Anxiety state [F41.1]   . Polysubstance abuse [F19.10]   . Major depressive disorder, recurrent severe without psychotic features ([place]) [F33.2] 04/13/2016   Total Time spent with patient: 20 minutes  Past Psychiatric History: Past history of recurrent hospitalizations for severe major depression history of suicide attempts. History of some response to medicine in the past but also a major history of substance abuse problems  Past Medical History:  Past Medical History:  Diagnosis Date  . Depression   . Diabetes mellitus without complication [facility])     Past Surgical  History:  Procedure Laterality Date  . INGUINAL HERNIA REPAIR    . LUMBAR FUSION     Family History: History reviewed. No pertinent family history. Family Psychiatric  History: Positive for substance abuse Social History:  History  Alcohol Use No    Comment: denies use     History  Drug Use  . Types: Marijuana, Cocaine    Comment: Last used in february 2018    Social History   Social History  . Marital status: Single    Spouse name: N/A  . Number of children: N/A  . Years of education: N/A   Social History Main Topics  . Smoking status: Current Every Day Smoker    Packs/day: 0.50    Types: Cigarettes  . Smokeless tobacco: Never Used  . Alcohol use No     Comment: denies use  . Drug use: Yes    Types: Marijuana, Cocaine     Comment: Last used in february 2018  . Sexual activity: No   Other Topics Concern  . None   Social History Narrative  . None   Additional Social History:    History of alcohol / drug use?: Yes Name of Substance 1: marijuana 1 - Last Use / Amount: UDS positive, but pt reports last use in february 2018 Name of Substance 2: Cocaine  2 - Last Use / Amount: UDS negative, reports last use in february 2018                Sleep: Fair  Appetite:  Fair  Current Medications: Current Facility-Administered Medications  Medication Dose Route Frequency Provider Last Rate Last Dose  . acetaminophen (TYLENOL) tablet 650 mg  650 mg Oral Q6H PRN  Gonzella Lex, MD      . alum & mag hydroxide-simeth (MAALOX/MYLANTA) 200-200-20 MG/5ML suspension 30 mL  30 mL Oral Q4H PRN Gonzella Lex, MD      . desvenlafaxine (PRISTIQ) 24 hr tablet 50 mg  50 mg Oral Daily Jolanta B Pucilowska, MD   50 mg at 11/06/16 0836  . feeding supplement (ENSURE ENLIVE) (ENSURE ENLIVE) liquid 237 mL  237 mL Oral BID BM Jolanta B Pucilowska, MD   237 mL at 11/06/16 1340  . hydrOXYzine (ATARAX/VISTARIL) tablet 50 mg  50 mg Oral TID PRN Clovis Fredrickson, MD   50 mg at  11/05/16 1304  . ibuprofen (ADVIL,MOTRIN) tablet 800 mg  800 mg Oral Q6H PRN Clovis Fredrickson, MD   800 mg at 11/06/16 0837  . insulin aspart (novoLOG) injection 0-15 Units  0-15 Units Subcutaneous TID WC Clovis Fredrickson, MD   8 Units at 11/06/16 1132  . insulin aspart (novoLOG) injection 0-5 Units  0-5 Units Subcutaneous QHS Jolanta B Pucilowska, MD      . magnesium hydroxide (MILK OF MAGNESIA) suspension 30 mL  30 mL Oral Daily PRN Gonzella Lex, MD      . metFORMIN (GLUCOPHAGE) tablet 500 mg  500 mg Oral BID WC Gonzella Lex, MD   500 mg at 11/06/16 0836  . nicotine (NICODERM CQ - dosed in mg/24 hours) patch 21 mg  21 mg Transdermal Daily Jolanta B Pucilowska, MD   21 mg at 11/06/16 0837  . penicillin v potassium (VEETID) tablet 500 mg  500 mg Oral Q6H Gonzella Lex, MD   500 mg at 11/06/16 1132  . traZODone (DESYREL) tablet 100 mg  100 mg Oral QHS PRN Gonzella Lex, MD   100 mg at 11/05/16 2201    Lab Results:  Results for orders placed or performed during the hospital encounter of 11/04/16 (from the past 48 hour(s))  Urine Drug Screen, Qualitative     Status: Abnormal   Collection Time: 11/04/16  3:10 PM  Result Value Ref Range   Tricyclic, Ur Screen NONE DETECTED NONE DETECTED   Amphetamines, Ur Screen NONE DETECTED NONE DETECTED   MDMA (Ecstasy)Ur Screen NONE DETECTED NONE DETECTED   Cocaine Metabolite,Ur [place] NONE DETECTED NONE DETECTED   Opiate, Ur Screen NONE DETECTED NONE DETECTED   Phencyclidine (PCP) Ur S NONE DETECTED NONE DETECTED   Cannabinoid 50 Ng, Ur [place] POSITIVE (A) NONE DETECTED   Barbiturates, Ur Screen NONE DETECTED NONE DETECTED   Benzodiazepine, Ur Scrn NONE DETECTED NONE DETECTED   Methadone Scn, Ur NONE DETECTED NONE DETECTED    Comment: (NOTE) 297  Tricyclics, urine               Cutoff 1000 ng/mL 200  Amphetamines, urine             Cutoff 1000 ng/mL 300  MDMA (Ecstasy), urine           Cutoff 500 ng/mL 400  Cocaine Metabolite, urine       Cutoff  300 ng/mL 500  Opiate, urine                   Cutoff 300 ng/mL 600  Phencyclidine (PCP), urine      Cutoff 25 ng/mL 700  Cannabinoid, urine              Cutoff 50 ng/mL 800  Barbiturates, urine  Cutoff 200 ng/mL 900  Benzodiazepine, urine           Cutoff 200 ng/mL 1000 Methadone, urine                Cutoff 300 ng/mL 1100 1200 The urine drug screen provides only a preliminary, unconfirmed 1300 analytical test result and should not be used for non-medical 1400 purposes. Clinical consideration and professional judgment should 1500 be applied to any positive drug screen result due to possible 1600 interfering substances. A more specific alternate chemical method 1700 must be used in order to obtain a confirmed analytical result.  1800 Gas chromato graphy / mass spectrometry (GC/MS) is the preferred 1900 confirmatory method.   Glucose, capillary     Status: Abnormal   Collection Time: 11/04/16  4:11 PM  Result Value Ref Range   Glucose-Capillary 234 (H) 65 - 99 mg/dL   Comment 1 Notify RN   Glucose, capillary     Status: Abnormal   Collection Time: 11/05/16 12:43 AM  Result Value Ref Range   Glucose-Capillary 189 (H) 65 - 99 mg/dL  Glucose, capillary     Status: Abnormal   Collection Time: 11/05/16  7:00 AM  Result Value Ref Range   Glucose-Capillary 177 (H) 65 - 99 mg/dL  Lipid panel     Status: Abnormal   Collection Time: 11/05/16  7:07 AM  Result Value Ref Range   Cholesterol 210 (H) 0 - 200 mg/dL   Triglycerides 170 (H) <150 mg/dL   HDL 23 (L) >40 mg/dL   Total CHOL/HDL Ratio 9.1 RATIO   VLDL 34 0 - 40 mg/dL   LDL Cholesterol 153 (H) 0 - 99 mg/dL    Comment:        Total Cholesterol/HDL:CHD Risk Coronary Heart Disease Risk Table                     Men   Women  1/2 Average Risk   3.4   3.3  Average Risk       5.0   4.4  2 X Average Risk   9.6   7.1  3 X Average Risk  23.4   11.0        Use the calculated Patient Ratio above and the CHD Risk Table to  determine the patient's CHD Risk.        ATP III CLASSIFICATION (LDL):  <100     mg/dL   Optimal  100-129  mg/dL   Near or Above                    Optimal  130-159  mg/dL   Borderline  160-189  mg/dL   High  >190     mg/dL   Very High   TSH     Status: None   Collection Time: 11/05/16  7:07 AM  Result Value Ref Range   TSH 2.755 0.350 - 4.500 uIU/mL    Comment: Performed by a 3rd Generation assay with a functional sensitivity of <=0.01 uIU/mL.  Glucose, capillary     Status: Abnormal   Collection Time: 11/05/16 11:44 AM  Result Value Ref Range   Glucose-Capillary 179 (H) 65 - 99 mg/dL  Glucose, capillary     Status: Abnormal   Collection Time: 11/05/16  4:26 PM  Result Value Ref Range   Glucose-Capillary 159 (H) 65 - 99 mg/dL  Glucose, capillary     Status: Abnormal   Collection Time: 11/05/16  9:14  PM  Result Value Ref Range   Glucose-Capillary 129 (H) 65 - 99 mg/dL  Glucose, capillary     Status: Abnormal   Collection Time: 11/06/16  6:44 AM  Result Value Ref Range   Glucose-Capillary 163 (H) 65 - 99 mg/dL  Glucose, capillary     Status: Abnormal   Collection Time: 11/06/16 11:33 AM  Result Value Ref Range   Glucose-Capillary 269 (H) 65 - 99 mg/dL   Comment 1 Notify RN     Blood Alcohol level:  Lab Results  Component Value Date   ETH <5 11/03/2016   ETH <5 60/73/7106    Metabolic Disorder Labs: Lab Results  Component Value Date   HGBA1C 8.3 (H) 10/13/2016   MPG 192 10/13/2016   MPG 140 04/15/2016   No results found for: PROLACTIN Lab Results  Component Value Date   CHOL 210 (H) 11/05/2016   TRIG 170 (H) 11/05/2016   HDL 23 (L) 11/05/2016   CHOLHDL 9.1 11/05/2016   VLDL 34 11/05/2016   LDLCALC 153 (H) 11/05/2016   LDLCALC 138 (H) 04/15/2016    Physical Findings: AIMS: Facial and Oral Movements Muscles of Facial Expression: None, normal Lips and Perioral Area: None, normal Jaw: None, normal Tongue: None, normal,Extremity Movements Upper (arms,  wrists, hands, fingers): None, normal Lower (legs, knees, ankles, toes): None, normal, Trunk Movements Neck, shoulders, hips: None, normal, Overall Severity Severity of abnormal movements (highest score from questions above): None, normal Incapacitation due to abnormal movements: None, normal Patient's awareness of abnormal movements (rate only patient's report): No Awareness, Dental Status Current problems with teeth and/or dentures?: Yes (R upper molar loose, painful) Does patient usually wear dentures?: No  CIWA:    COWS:     Musculoskeletal: Strength & Muscle Tone: within normal limits Gait & Station: normal Patient leans: N/A  Psychiatric Specialty Exam: Physical Exam  Nursing note and vitals reviewed. Constitutional: He appears well-developed and well-nourished.  HENT:  Head: Normocephalic and atraumatic.  Eyes: Conjunctivae are normal. Pupils are equal, round, and reactive to light.  Neck: Normal range of motion.  Cardiovascular: Regular rhythm and normal heart sounds.   Respiratory: Effort normal. No respiratory distress.  GI: Soft.  Musculoskeletal: Normal range of motion.  Neurological: He is alert.  Skin: Skin is warm and dry.  Psychiatric: His speech is not delayed. He is slowed. Cognition and memory are normal. He expresses impulsivity. He exhibits a depressed mood. He expresses no suicidal ideation. He expresses no suicidal plans.    Review of Systems  Constitutional: Negative.   HENT: Negative.   Eyes: Negative.   Respiratory: Negative.   Cardiovascular: Negative.   Gastrointestinal: Negative.   Musculoskeletal: Negative.   Skin: Negative.   Neurological: Negative.   Psychiatric/Behavioral: Positive for depression and substance abuse. Negative for hallucinations, memory loss and suicidal ideas. The patient is nervous/anxious. The patient does not have insomnia.     Blood pressure 110/67, pulse 85, temperature 97.8 F (36.6 C), temperature source Oral, resp.  rate 18, height 5\' 10"  (1.778 m), weight 75.8 kg (167 lb), SpO2 100 %.Body mass index is 23.96 kg/m.  General Appearance: Fairly Groomed  Eye Contact:  Good  Speech:  Normal Rate  Volume:  Normal  Mood:  Depressed  Affect:  Appropriate  Thought Process:  Goal Directed  Orientation:  Full (Time, Place, and Person)  Thought Content:  Logical  Suicidal Thoughts:  No  Homicidal Thoughts:  No  Memory:  Immediate;   Good Recent;  Good Remote;   Good  Judgement:  Fair  Insight:  Fair  Psychomotor Activity:  Normal  Concentration:  Concentration: Fair  Recall:  [employer] of Knowledge:  Fair  Language:  Fair  Akathisia:  No  Handed:  Right  AIMS (if indicated):     Assets:  Communication Skills Desire for Improvement Physical Health Resilience  ADL's:  Intact  Cognition:  WNL  Sleep:  Number of Hours: 6.15     Treatment Plan Summary: Daily contact with patient to assess and evaluate symptoms and progress in treatment, Medication management and Plan No change to medicine. Patient is anxious about being able to afford his medicine after discharge. I told him I will look up some information about the [employer] patient assistance program. Encourage his attendance at group.  Alethia Berthold, MD 11/06/2016, 1:53 PM

## 2016-11-06 NOTE — Progress Notes (Signed)
Received AAOx4, pleasant, groomed, cooperative. Interacts appropriately on unit. Denies SI/HI/AVH, reports depression 6/10, hopelessness and anxiety 5/10, verbally contracts for safety. Continued complaints about tooth pain. Expressed desire to work on finding placement post hospitalization. Encouraged to continue to communicate needs to staff and participate in unit activities. Will monitor.

## 2016-11-06 NOTE — [facility] Group Notes (Signed)
[place] LCSW Group Therapy  11/06/2016 3:10 PM  Type of Therapy:  Group Therapy  Participation Level:  Active  Participation Quality:  Appropriate and Sharing  Affect:  Appropriate  Cognitive:  Alert  Insight:  Developing/Improving  Engagement in Therapy:  Developing/Improving  Modes of Intervention:  Activity, Discussion, Education, Problem-solving, Reality Testing, Socialization and Support  Summary of Progress/Problems: Stress management: Patients defined and discussed the topic of stress and the related symptoms and triggers for stress. Patients identified healthy coping skills they would like to try during hospitalization and after discharge to manage stress in a healthy way. CSW offered insight to varying stress management techniques. Patient stated he is working on learning forgiveness for his family and learning to let things go from the past. CSW provided support to patient.   Paxton Binns G. [place], [place] 11/06/2016, 3:10 PM

## 2016-11-06 NOTE — Progress Notes (Signed)
D: Pt denies SI/HI/AVH. Pt is pleasant and cooperative, affect is flat, but bright o approach. Patient's thoughts are organized, no distress noted. Patient appears less anxious and he is interacting with peers and staff appropriately.  A: Pt was offered support and encouragement. Pt was given scheduled medications. Pt was encouraged to attend groups. Q 15 minute checks were done for safety.  R:Pt attends groups and interacts well with peers and staff. Pt is taking medication. Pt has no complaints.Pt receptive to treatment and safety maintained on unit.

## 2016-11-06 NOTE — [facility] Group Notes (Signed)
[place] Group Notes:  (Nursing/MHT/Case Management/Adjunct)  Date:  11/06/2016  Time:  12:41 AM  Type of Therapy:  Group Therapy  Participation Level:  Active  Participation Quality:  Appropriate  Affect:  Appropriate  Cognitive:  Appropriate  Insight:  Appropriate  Engagement in Group:  Engaged  Modes of Intervention:  n/a  Summary of Progress/Problems:  Edgar White 11/06/2016, 12:41 AM

## 2016-11-06 NOTE — Progress Notes (Signed)
Patient ID: Edgar White, male   DOB: [birth year], 51 y.o.   MRN: [id number]  CSW provided patient with medication management clinic for assistance with affording medications. Patient stated he would give it to his assigned CSW tomorrow once completed.   Edgar White G. Claybon Jabs MSW, [facility] 11/06/2016 4:50 PM

## 2016-11-07 LAB — GLUCOSE, CAPILLARY
Glucose-Capillary: 174 mg/dL — ABNORMAL HIGH (ref 65–99)
Glucose-Capillary: 217 mg/dL — ABNORMAL HIGH (ref 65–99)
Glucose-Capillary: 108 mg/dL — ABNORMAL HIGH (ref 65–99)
Glucose-Capillary: 201 mg/dL — ABNORMAL HIGH (ref 65–99)

## 2016-11-07 MED ORDER — GEMFIBROZIL 600 MG PO TABS
600.0000 mg | ORAL_TABLET | Freq: Two times a day (BID) | ORAL | Status: DC
  Administered 2016-11-07 – 2016-11-10 (×6): 600 mg via ORAL
  Filled 2016-11-07 (×6): qty 1

## 2016-11-07 NOTE — Progress Notes (Signed)
[facility] MD Progress Note  11/07/2016 10:21 AM Edgar White  MRN:  [id number] Subjective:    11/05/2016. Patient with a history of major depression and anxiety and substance abuse admitted to the hospital with suicidal ideation and severe depression. Today the patient is neatly dressed and groomed. He is attending groups and behaving appropriately. In interview however he still describes his mood is very anxious and hopeless with suicidal thoughts. Feels very nervous about not having a place to stay or any good outpatient plan. Affect is dysphoric but reactive. No sign of psychosis. Tolerating medicinespecific new physical complaint  11/06/2016. Follow-up Sunday the 18th. Patient seen. His mood he says is a little bit better. Not having active thoughts of suicide. Slept a little better last night. He is neatly dressed and attends groups and interacts very appropriately.  11/07/2016. Edgar White still is very depressed and utterly hopeless. He is homeless and very frightened about his disposition. He could go with his brother but needs to get a job first. He is interested in substance abuse treatment but has been positive for marijuana on admission. There is a history of substance use in the past year or so. There are no somatic complaints. Good group participation. He accepts medications and tolerates them well.  Per nursing: Received AAOx4, pleasant, groomed, cooperative. Interacts appropriately on unit. Denies SI/HI/AVH, reports depression 6/10, hopelessness and anxiety 5/10, verbally contracts for safety. Continued complaints about tooth pain. Expressed desire to work on finding placement post hospitalization. Encouraged to continue to communicate needs to staff and participate in unit activities. Will monitor.   Principal Problem: Major depressive disorder, recurrent severe without psychotic features ([place]) Diagnosis:   Patient Active Problem List   Diagnosis Date Noted  . Cannabis use disorder, moderate,  dependence ([place]) [F12.20] 11/04/2016  . Tobacco use disorder [F17.200] 11/04/2016  . Severe recurrent major depression without psychotic features ([place]) [F33.2] 11/04/2016  . Substance induced mood disorder ([place]) [F19.94] 10/13/2016  . Diabetes mellitus without complication ([place]) [E52.7] 06/08/2016  . Anxiety state [F41.1]   . Polysubstance abuse [F19.10]   . Major depressive disorder, recurrent severe without psychotic features ([place]) [F33.2] 04/13/2016   Total Time spent with patient: 20 minutes  Past Psychiatric History: Past history of recurrent hospitalizations for severe major depression history of suicide attempts. History of some response to medicine in the past but also a major history of substance abuse problems  Past Medical History:  Past Medical History:  Diagnosis Date  . Depression   . Diabetes mellitus without complication [facility])     Past Surgical History:  Procedure Laterality Date  . INGUINAL HERNIA REPAIR    . LUMBAR FUSION     Family History: History reviewed. No pertinent family history. Family Psychiatric  History: Positive for substance abuse Social History:  History  Alcohol Use No    Comment: denies use     History  Drug Use  . Types: Marijuana, Cocaine    Comment: Last used in february 2018    Social History   Social History  . Marital status: Single    Spouse name: N/A  . Number of children: N/A  . Years of education: N/A   Social History Main Topics  . Smoking status: Current Every Day Smoker    Packs/day: 0.50    Types: Cigarettes  . Smokeless tobacco: Never Used  . Alcohol use No     Comment: denies use  . Drug use: Yes    Types: Marijuana, Cocaine  Comment: Last used in february 2018  . Sexual activity: No   Other Topics Concern  . None   Social History Narrative  . None   Additional Social History:    History of alcohol / drug use?: Yes Name of Substance 1: marijuana 1 - Last Use / Amount: UDS positive, but pt reports  last use in february 2018 Name of Substance 2: Cocaine  2 - Last Use / Amount: UDS negative, reports last use in february 2018                Sleep: Fair  Appetite:  Fair  Current Medications: Current Facility-Administered Medications  Medication Dose Route Frequency Provider Last Rate Last Dose  . acetaminophen (TYLENOL) tablet 650 mg  650 mg Oral Q6H PRN Gonzella Lex, MD      . alum & mag hydroxide-simeth (MAALOX/MYLANTA) 200-200-20 MG/5ML suspension 30 mL  30 mL Oral Q4H PRN Gonzella Lex, MD      . desvenlafaxine (PRISTIQ) 24 hr tablet 50 mg  50 mg Oral Daily Jolanta B Pucilowska, MD   50 mg at 11/07/16 0847  . feeding supplement (ENSURE ENLIVE) (ENSURE ENLIVE) liquid 237 mL  237 mL Oral BID BM Jolanta B Pucilowska, MD   237 mL at 11/07/16 1000  . hydrOXYzine (ATARAX/VISTARIL) tablet 50 mg  50 mg Oral TID PRN Clovis Fredrickson, MD   50 mg at 11/05/16 1304  . ibuprofen (ADVIL,MOTRIN) tablet 800 mg  800 mg Oral Q6H PRN Clovis Fredrickson, MD   800 mg at 11/06/16 2059  . insulin aspart (novoLOG) injection 0-15 Units  0-15 Units Subcutaneous TID WC Clovis Fredrickson, MD   3 Units at 11/07/16 0847  . insulin aspart (novoLOG) injection 0-5 Units  0-5 Units Subcutaneous QHS Jolanta B Pucilowska, MD      . magnesium hydroxide (MILK OF MAGNESIA) suspension 30 mL  30 mL Oral Daily PRN Gonzella Lex, MD      . metFORMIN (GLUCOPHAGE) tablet 500 mg  500 mg Oral BID WC Gonzella Lex, MD   500 mg at 11/07/16 0847  . nicotine (NICODERM CQ - dosed in mg/24 hours) patch 21 mg  21 mg Transdermal Daily Jolanta B Pucilowska, MD   21 mg at 11/07/16 0847  . penicillin v potassium (VEETID) tablet 500 mg  500 mg Oral Q6H Gonzella Lex, MD   500 mg at 11/07/16 2992  . traZODone (DESYREL) tablet 100 mg  100 mg Oral QHS PRN Gonzella Lex, MD   100 mg at 11/06/16 2057    Lab Results:  Results for orders placed or performed during the hospital encounter of 11/04/16 (from the past 48 hour(s))   Glucose, capillary     Status: Abnormal   Collection Time: 11/05/16 11:44 AM  Result Value Ref Range   Glucose-Capillary 179 (H) 65 - 99 mg/dL  Glucose, capillary     Status: Abnormal   Collection Time: 11/05/16  4:26 PM  Result Value Ref Range   Glucose-Capillary 159 (H) 65 - 99 mg/dL  Glucose, capillary     Status: Abnormal   Collection Time: 11/05/16  9:14 PM  Result Value Ref Range   Glucose-Capillary 129 (H) 65 - 99 mg/dL  Glucose, capillary     Status: Abnormal   Collection Time: 11/06/16  6:44 AM  Result Value Ref Range   Glucose-Capillary 163 (H) 65 - 99 mg/dL  Glucose, capillary     Status: Abnormal   Collection  Time: 11/06/16 11:33 AM  Result Value Ref Range   Glucose-Capillary 269 (H) 65 - 99 mg/dL   Comment 1 Notify RN   Glucose, capillary     Status: Abnormal   Collection Time: 11/06/16  4:41 PM  Result Value Ref Range   Glucose-Capillary 133 (H) 65 - 99 mg/dL  Glucose, capillary     Status: Abnormal   Collection Time: 11/06/16  8:54 PM  Result Value Ref Range   Glucose-Capillary 194 (H) 65 - 99 mg/dL  Glucose, capillary     Status: Abnormal   Collection Time: 11/07/16  6:45 AM  Result Value Ref Range   Glucose-Capillary 174 (H) 65 - 99 mg/dL    Blood Alcohol level:  Lab Results  Component Value Date   ETH <5 11/03/2016   ETH <5 59/56/3875    Metabolic Disorder Labs: Lab Results  Component Value Date   HGBA1C 8.3 (H) 10/13/2016   MPG 192 10/13/2016   MPG 140 04/15/2016   No results found for: PROLACTIN Lab Results  Component Value Date   CHOL 210 (H) 11/05/2016   TRIG 170 (H) 11/05/2016   HDL 23 (L) 11/05/2016   CHOLHDL 9.1 11/05/2016   VLDL 34 11/05/2016   LDLCALC 153 (H) 11/05/2016   LDLCALC 138 (H) 04/15/2016    Physical Findings: AIMS: Facial and Oral Movements Muscles of Facial Expression: None, normal Lips and Perioral Area: None, normal Jaw: None, normal Tongue: None, normal,Extremity Movements Upper (arms, wrists, hands,  fingers): None, normal Lower (legs, knees, ankles, toes): None, normal, Trunk Movements Neck, shoulders, hips: None, normal, Overall Severity Severity of abnormal movements (highest score from questions above): None, normal Incapacitation due to abnormal movements: None, normal Patient's awareness of abnormal movements (rate only patient's report): No Awareness, Dental Status Current problems with teeth and/or dentures?: Yes (R upper molar loose, painful) Does patient usually wear dentures?: No  CIWA:    COWS:     Musculoskeletal: Strength & Muscle Tone: within normal limits Gait & Station: normal Patient leans: N/A  Psychiatric Specialty Exam: Physical Exam  Nursing note and vitals reviewed. Psychiatric: His behavior is normal. Cognition and memory are normal. He expresses impulsivity. He exhibits a depressed mood.    Review of Systems  Psychiatric/Behavioral: Positive for depression and substance abuse.  All other systems reviewed and are negative.   Blood pressure 140/78, pulse 78, temperature 98.1 F (36.7 C), temperature source Oral, resp. rate 18, height 5\' 10"  (1.778 m), weight 75.8 kg (167 lb), SpO2 100 %.Body mass index is 23.96 kg/m.  General Appearance: Fairly Groomed  Eye Contact:  Good  Speech:  Normal Rate  Volume:  Normal  Mood:  Depressed  Affect:  Appropriate  Thought Process:  Goal Directed  Orientation:  Full (Time, Place, and Person)  Thought Content:  Logical  Suicidal Thoughts:  No  Homicidal Thoughts:  No  Memory:  Immediate;   Good Recent;   Good Remote;   Good  Judgement:  Fair  Insight:  Fair  Psychomotor Activity:  Normal  Concentration:  Concentration: Fair  Recall:  [employer] of Knowledge:  Fair  Language:  Fair  Akathisia:  No  Handed:  Right  AIMS (if indicated):     Assets:  Communication Skills Desire for Improvement Physical Health Resilience  ADL's:  Intact  Cognition:  WNL  Sleep:  Number of Hours: 6.15     Treatment  Plan Summary: Daily contact with patient to assess and evaluate symptoms and progress  in treatment, Medication management and Plan No change to medicine. Patient is anxious about being able to afford his medicine after discharge. I told him I will look up some information about the Pristiq patient assistance program. Encourage his attendance at group.   Edgar White is a 51 year old male with a history of depression, anxiety, mood instability, and substance abuse admitted for worsening of depression and suicidal ideation with a plan to overdose in the context of severe social stressors.  1. Suicidal ideation. The patient is able to contract for safety in the hospital.  2. Mood. He was started on Pristiq that was helpful in the past.  3. Diabetes. He is on metformin and ADA. His sugars are elevated. He receives Novolog according to sliding scale. Hemoglobin A1c is elevated at 8.3   4. Anxiety. Vistaril is available.   5. Smoking. Nicotine patch is available.  6. Insomnia. Trazodone is available.  7. Ear infection. He is on penicillin for 39 doses.  8. Metabolic syndrome monitoring. Lipid panel shows elevated cholesterol and triglycerides. We'll start Lopid.  9. Disposition. To be established. The patient is homeless. Orson Slick, MD 11/07/2016, 10:21 AM

## 2016-11-07 NOTE — Progress Notes (Signed)
Patient denies SI, HI, AVH. Pt reports going to group has helped. Reports feeling funny with anti-depressant medication. Med and group compliant. Socializes appropriately with peers. Appropriate with staff. Encouragement and support offered. Safety checks maintained. Pt receptive and remains safe on unit with q 15 min checks.

## 2016-11-07 NOTE — Tx Team (Addendum)
Interdisciplinary Treatment and Diagnostic Plan Update  11/07/2016 Time of Session: 10:30 AM Edgar White MRN: [id number]  Principal Diagnosis: Major depressive disorder, recurrent severe without psychotic features ([place])  Secondary Diagnoses: Principal Problem:   Major depressive disorder, recurrent severe without psychotic features ([place]) Active Problems:   Diabetes mellitus without complication ([place])   Cannabis use disorder, moderate, dependence ([place])   Tobacco use disorder   Severe recurrent major depression without psychotic features ([place])   Current Medications:  Current Facility-Administered Medications  Medication Dose Route Frequency Provider Last Rate Last Dose  . acetaminophen (TYLENOL) tablet 650 mg  650 mg Oral Q6H PRN Gonzella Lex, MD      . alum & mag hydroxide-simeth (MAALOX/MYLANTA) 200-200-20 MG/5ML suspension 30 mL  30 mL Oral Q4H PRN Gonzella Lex, MD      . desvenlafaxine (PRISTIQ) 24 hr tablet 50 mg  50 mg Oral Daily Jolanta B Pucilowska, MD   50 mg at 11/07/16 0847  . feeding supplement (ENSURE ENLIVE) (ENSURE ENLIVE) liquid 237 mL  237 mL Oral BID BM Jolanta B Pucilowska, MD   237 mL at 11/07/16 1000  . hydrOXYzine (ATARAX/VISTARIL) tablet 50 mg  50 mg Oral TID PRN Clovis Fredrickson, MD   50 mg at 11/05/16 1304  . ibuprofen (ADVIL,MOTRIN) tablet 800 mg  800 mg Oral Q6H PRN Clovis Fredrickson, MD   800 mg at 11/06/16 2059  . insulin aspart (novoLOG) injection 0-15 Units  0-15 Units Subcutaneous TID WC Clovis Fredrickson, MD   3 Units at 11/07/16 0847  . insulin aspart (novoLOG) injection 0-5 Units  0-5 Units Subcutaneous QHS Jolanta B Pucilowska, MD      . magnesium hydroxide (MILK OF MAGNESIA) suspension 30 mL  30 mL Oral Daily PRN Gonzella Lex, MD      . metFORMIN (GLUCOPHAGE) tablet 500 mg  500 mg Oral BID WC Gonzella Lex, MD   500 mg at 11/07/16 0847  . nicotine (NICODERM CQ - dosed in mg/24 hours) patch 21 mg  21 mg Transdermal Daily Jolanta B  Pucilowska, MD   21 mg at 11/07/16 0847  . penicillin v potassium (VEETID) tablet 500 mg  500 mg Oral Q6H Gonzella Lex, MD   500 mg at 11/07/16 0932  . traZODone (DESYREL) tablet 100 mg  100 mg Oral QHS PRN Gonzella Lex, MD   100 mg at 11/06/16 2057   PTA Medications: Prescriptions Prior to Admission  Medication Sig Dispense Refill Last Dose  . amoxicillin (AMOXIL) 500 MG capsule Take 1 capsule (500 mg total) by mouth every 8 (eight) hours. For tooth infection (Patient not taking: Reported on 11/04/2016) 11 capsule 0 Completed Course at Unknown time  . benzocaine (ORAJEL) 10 % mucosal gel Use as directed in the mouth or throat 3 (three) times daily as needed for mouth pain. 5.3 g 0   . hydrOXYzine (ATARAX/VISTARIL) 25 MG tablet Take 1 tablet (25 mg total) by mouth every 6 (six) hours as needed for anxiety. 60 tablet 0   . metFORMIN (GLUCOPHAGE) 500 MG tablet Take 1 tablet (500 mg total) by mouth 2 (two) times daily with a meal. For diabetes management 60 tablet 0   . nicotine (NICODERM CQ - DOSED IN MG/24 HOURS) 21 mg/24hr patch Place 1 patch (21 mg total) onto the skin daily. For smoking cessation 28 patch 0   . traZODone (DESYREL) 50 MG tablet Take 1 tablet (50 mg total) by mouth at bedtime  as needed for sleep. 30 tablet 0   . venlafaxine XR (EFFEXOR-XR) 150 MG 24 hr capsule Take 1 capsule (150 mg total) by mouth daily with breakfast. For depression 30 capsule 0     Patient Stressors: Financial difficulties Marital or family conflict Substance abuse  Patient Strengths: Capable of independent living Motivation for treatment/growth Physical Health  Treatment Modalities: Medication Management, Group therapy, Case management,  1 to 1 session with clinician, Psychoeducation, Recreational therapy.   Physician Treatment Plan for Primary Diagnosis: Major depressive disorder, recurrent severe without psychotic features ([place]) Long Term Goal(s): Improvement in symptoms so as ready for  discharge Improvement in symptoms so as ready for discharge   Short Term Goals: Ability to identify changes in lifestyle to reduce recurrence of condition will improve Ability to verbalize feelings will improve Ability to disclose and discuss suicidal ideas Ability to demonstrate self-control will improve Ability to identify and develop effective coping behaviors will improve Ability to maintain clinical measurements within normal limits will improve Compliance with prescribed medications will improve Ability to identify triggers associated with substance abuse/mental health issues will improve Ability to identify changes in lifestyle to reduce recurrence of condition will improve Ability to demonstrate self-control will improve Ability to identify triggers associated with substance abuse/mental health issues will improve  Medication Management: Evaluate patient's response, side effects, and tolerance of medication regimen.  Therapeutic Interventions: 1 to 1 sessions, Unit Group sessions and Medication administration.  Evaluation of Outcomes: Progressing  Physician Treatment Plan for Secondary Diagnosis: Principal Problem:   Major depressive disorder, recurrent severe without psychotic features ([place]) Active Problems:   Diabetes mellitus without complication ([place])   Cannabis use disorder, moderate, dependence ([place])   Tobacco use disorder   Severe recurrent major depression without psychotic features ([place])  Long Term Goal(s): Improvement in symptoms so as ready for discharge Improvement in symptoms so as ready for discharge   Short Term Goals: Ability to identify changes in lifestyle to reduce recurrence of condition will improve Ability to verbalize feelings will improve Ability to disclose and discuss suicidal ideas Ability to demonstrate self-control will improve Ability to identify and develop effective coping behaviors will improve Ability to maintain clinical measurements  within normal limits will improve Compliance with prescribed medications will improve Ability to identify triggers associated with substance abuse/mental health issues will improve Ability to identify changes in lifestyle to reduce recurrence of condition will improve Ability to demonstrate self-control will improve Ability to identify triggers associated with substance abuse/mental health issues will improve     Medication Management: Evaluate patient's response, side effects, and tolerance of medication regimen.  Therapeutic Interventions: 1 to 1 sessions, Unit Group sessions and Medication administration.  Evaluation of Outcomes: Progressing   RN Treatment Plan for Primary Diagnosis: Major depressive disorder, recurrent severe without psychotic features ([place]) Long Term Goal(s): Knowledge of disease and therapeutic regimen to maintain health will improve  Short Term Goals: Ability to remain free from injury will improve, Ability to disclose and discuss suicidal ideas and Compliance with prescribed medications will improve  Medication Management: RN will administer medications as ordered by provider, will assess and evaluate patient's response and provide education to patient for prescribed medication. RN will report any adverse and/or side effects to prescribing provider.  Therapeutic Interventions: 1 on 1 counseling sessions, Psychoeducation, Medication administration, Evaluate responses to treatment, Monitor vital signs and CBGs as ordered, Perform/monitor CIWA, COWS, AIMS and Fall Risk screenings as ordered, Perform wound care treatments as ordered.  Evaluation of  Outcomes: Progressing   LCSW Treatment Plan for Primary Diagnosis: Major depressive disorder, recurrent severe without psychotic features ([place]) Long Term Goal(s): Safe transition to appropriate next level of care at discharge, Engage patient in therapeutic group addressing interpersonal concerns.  Short Term Goals: Engage  patient in aftercare planning with referrals and resources, Increase social support, Increase emotional regulation and Identify triggers associated with mental health/substance abuse issues  Therapeutic Interventions: Assess for all discharge needs, 1 to 1 time with Social worker, Explore available resources and support systems, Assess for adequacy in community support network, Educate family and significant other(s) on suicide prevention, Complete Psychosocial Assessment, Interpersonal group therapy.  Evaluation of Outcomes: Progressing    Recreational Therapy Treatment Plan for Primary Diagnosis: Major depressive disorder, recurrent severe without psychotic features ([place]) Long Term Goal(s): Patient will participate in recreation therapy treatment in at least 2 group sessions without prompting from LRT  Short Term Goals: Increase self-esteem, Increase healthy coping skills  Treatment Modalities: Group Therapy and Individual Treatment Sessions  Therapeutic Interventions: Psychoeducation  Evaluation of Outcomes: Progressing   Progress in Treatment: Attending groups: Yes. Participating in groups: Yes. Taking medication as prescribed: Yes. Toleration medication: Yes. Family/Significant other contact made: Yes, individual(s) contacted:  CSW spoke with pt's sister. Patient understands diagnosis: Yes. Discussing patient identified problems/goals with staff: Yes. Medical problems stabilized or resolved: Yes. Denies suicidal/homicidal ideation: Yes. Issues/concerns per patient self-inventory: No.  New problem(s) identified: Yes: homelessness   New Short Term/Long Term Goal(s): Pt's goal is to locate stable housing and progress in recovery.   Discharge Plan or Barriers: CSW still assessing proper aftercare plans.  Reason for Continuation of Hospitalization: Depression, suicidal ideation  Estimated Length of Stay: 2-3 days   Attendees: Patient: Edgar White  11/07/2016 9:33 AM   Physician: Dr. Orson Slick, MD  11/07/2016 9:33 AM  Nursing: Floyde Parkins, RN  11/07/2016 9:33 AM  RN Care Manager: 11/07/2016 9:33 AM  Social Worker: Glorious Peach, MSW, LCSW-A 11/07/2016 9:33 AM  Recreational Therapist: Drue Flirt, LRT/CTRS 11/07/2016 9:33 AM  Other:  11/07/2016 9:33 AM  Other:  11/07/2016 9:33 AM  Other: 11/07/2016 9:33 AM    Scribe for Treatment Team: Emilie Rutter, [place] 11/07/2016 9:33 AM

## 2016-11-07 NOTE — [facility] Group Notes (Signed)
[facility] Group Notes:  (Nursing/MHT/Case Management/Adjunct)  Date:  11/07/2016  Time:  11:05 PM  Type of Therapy:  Evening Wrap-up Group  Participation Level:  Active  Participation Quality:  Appropriate and Attentive  Affect:  Appropriate  Cognitive:  Alert and Appropriate  Insight:  Improving  Engagement in Group:  Developing/Improving and Engaged  Modes of Intervention:  Discussion  Summary of Progress/Problems:  Levonne Spiller 11/07/2016, 11:05 PM

## 2016-11-07 NOTE — Progress Notes (Signed)
Recreation Therapy Notes  INPATIENT RECREATION THERAPY ASSESSMENT  Patient Details Name: NAZARIO RUSSOM MRN: [id number] DOB: 1966/04/25 Today's Date: 11/07/2016  Patient Stressors: Family, Relationship, Death, Friends, Other (Comment) (Lack of support from family and friends; partner left him after his money ran out; mother's death in November 30, 2014; gave up a law suit he felt he could of won because he did not have enough money)  Coping Skills:   Isolate, Arguments, Substance Abuse, Art/Dance, Talking, Music  Personal Challenges: Anger, Communication, Concentration, Decision-Making, Problem-Solving, Relationships, Self-Esteem/Confidence, Stress Management, Substance Abuse, Time Management, Trusting Others  Leisure Interests (2+):  Individual - Other (Comment) (Spend time with friends, cook)  Awareness of Community Resources:  Yes  Community Resources:  [place], [place]  Current Use: No  If no, Barriers?:    Patient Strengths:  People person, adaptable  Patient Identified Areas of Improvement:  Self-esteem, finances, expressing himself in a healthier way  Current Recreation Participation:  Taking care of his dog, laughing with friends  Patient Goal for Hospitalization:  To stop being negative, work on coping, and think more positive  City of Residence:  [place] of Residence:  [place]   Current [place] (including self-harm):  No  Current [place]:  No  Consent to Intern Participation: N/A   Leonette Monarch, LRT/CTRS 11/07/2016, 4:22 PM

## 2016-11-07 NOTE — Plan of Care (Signed)
Problem: Coping: Goal: Ability to cope will improve Outcome: Progressing Pt reports no longer feeling suicidal. Feels much better

## 2016-11-07 NOTE — Progress Notes (Signed)
Recreation Therapy Notes  Date: 03.19.18 Time: 1:00 pm Location: Craft Room  Group Topic: Wellness  Goal Area(s) Addresses:  Patient will identify at least one item per dimension of health. Patient will examine areas they are deficient in.  Behavioral Response: Attentive, Interactive  Intervention: 6 Dimensions of Health  Activity: Patients were given a definition sheet of each dimension of wellness and a worksheet with each dimension listed. Patients were instructed to write things they were currently doing in each dimension.  Education: LRT educated patient on ways to improve each dimension.  Education Outcome: Acknowledges education/In group clarification offered  Clinical Observations/Feedback: Patient wrote things in each dimension. Patient contributed to group discussion by stating what areas he was giving enough attention to, what areas he was not giving enough attention to, how he can improve certain dimensions, how this activity relates to his admission to the hospital, how this activity relates to his d/c, and what would change for him if he was more aware of his wellness.  Leonette Monarch, LRT/CTRS 11/07/2016 2:21 PM

## 2016-11-07 NOTE — [facility] Group Notes (Signed)
[facility] Group Notes:  (Nursing/MHT/Case Management/Adjunct)  Date:  11/07/2016  Time:  4:47 PM  Type of Therapy:  Psychoeducational Skills  Participation Level:  Active  Participation Quality:  Appropriate, Attentive and Sharing  Affect:  Appropriate  Cognitive:  Appropriate  Insight:  Appropriate  Engagement in Group:  Engaged  Modes of Intervention:  Socialization  Summary of Progress/Problems:  Drake Leach 11/07/2016, 4:47 PM

## 2016-11-08 LAB — GLUCOSE, CAPILLARY
GLUCOSE-CAPILLARY: 264 mg/dL — AB (ref 65–99)
GLUCOSE-CAPILLARY: 162 mg/dL — AB (ref 65–99)
Glucose-Capillary: 138 mg/dL — ABNORMAL HIGH (ref 65–99)
Glucose-Capillary: 175 mg/dL — ABNORMAL HIGH (ref 65–99)

## 2016-11-08 NOTE — Progress Notes (Signed)
Edgar White was pleasant on approach. He was medication compliant on shift, he denies SI/HI/AVH. He still reports still being depressed.  Support and encouragement offered.  Safety checks maintained.

## 2016-11-08 NOTE — Progress Notes (Signed)
Denies SI/HI/AVH.  Rates depression as a 3/10 and anxiety as a 4/10.  Medication and group compliant.  Support and encouragement offered.  Safety checks maintained.

## 2016-11-08 NOTE — Plan of Care (Signed)
Problem: [facility] Participation in Recreation Therapeutic Interventions Goal: STG-Patient will demonstrate improved self esteem by identif STG: Self-Esteem - Within 4 treatment sessions, patient will verbalize at least 5 positive affirmation statements in each of 2 treatment sessions to increase self-esteem.  Outcome: Progressing Treatment Session 1; Completed 1 out of 2: At approximately 8:55 am, LRT met with patient in consultation room. Patient verbalized 5 positive affirmation statements. Patient reported it felt "good". LRT encouraged patient to continue saying positive affirmation statements.  Leonette Monarch, LRT/CTRS 03.20.18 1:59 pm Goal: STG-Patient will identify at least five coping skills for ** STG: Coping Skills - Within 4 treatment sessions, patient will verbalize at least 5 new coping skills in each of 2 treatment sessions to increase healthy coping skills.  Outcome: Progressing Treatment Session 1; Completed 1 out of 2: At approximately 8:55 am, LRT met with patient in consultation room. Patient verbalized 50 healthy coping skills. LRT encouraged patient to think of more healthy coping skills.  Leonette Monarch, LRT/CTRS 03.20.18 2:05 pm

## 2016-11-08 NOTE — B[facility] Group Notes (Signed)
[facility] LCSW Group Therapy Note  Date/Time: 11/08/2016, 9:30 AM  Type of Therapy/Topic:  Group Therapy:  Feelings about Diagnosis  Participation Level:  Did Not Attend    Description of Group:    This group will allow patients to explore their thoughts and feelings about diagnoses they have received. Patients will be guided to explore their level of understanding and acceptance of these diagnoses. Facilitator will encourage patients to process their thoughts and feelings about the reactions of others to their diagnosis, and will guide patients in identifying ways to discuss their diagnosis with significant others in their lives. This group will be process-oriented, with patients participating in exploration of their own experiences as well as giving and receiving support and challenge from other group members.   Therapeutic Goals: 1. Patient will demonstrate understanding of diagnosis as evidence by identifying two or more symptoms of the disorder:  2. Patient will be able to express two feelings regarding the diagnosis 3. Patient will demonstrate ability to communicate their needs through discussion and/or role plays.    Therapeutic Modalities:   Cognitive Behavioral Therapy Brief Therapy Feelings Identification   Glorious Peach, MSW, LCSW-A 11/08/2016, 10:24AM

## 2016-11-08 NOTE — Progress Notes (Signed)
[facility] MD Progress Note  11/08/2016 9:43 AM Edgar White  MRN:  [id number] Subjective:    11/05/2016. Patient with a history of major depression and anxiety and substance abuse admitted to the hospital with suicidal ideation and severe depression. Today the patient is neatly dressed and groomed. He is attending groups and behaving appropriately. In interview however he still describes his mood is very anxious and hopeless with suicidal thoughts. Feels very nervous about not having a place to stay or any good outpatient plan. Affect is dysphoric but reactive. No sign of psychosis. Tolerating medicinespecific new physical complaint  11/06/2016. Follow-up Sunday the 18th. Patient seen. His mood he says is a little bit better. Not having active thoughts of suicide. Slept a little better last night. He is neatly dressed and attends groups and interacts very appropriately.  11/07/2016. Edgar White still is very depressed and utterly hopeless. He is homeless and very frightened about his disposition. He could go with his brother but needs to get a job first. He is interested in substance abuse treatment but has been positive for marijuana on admission. There is a history of substance use in the past year or so. There are no somatic complaints. Good group participation. He accepts medications and tolerates them well.  11/08/2016. Edgar White seems improved but he is incapacitated with anxiety about discharge and is unable to contract for safety in the community. Unfortunately, multiple attempts to contact his brother have failed. His only option is discharge to the [facility]. He accepts medications and tolerates them well, eats and sleeps well, good program participation.   Per nursing: D: Pt denies SI/HI/AVH. Pt is pleasant and cooperative, affect is flat and sad, but brightens upon approach.  Pt  appears less anxious and he is interacting with peers and staff appropriately.  A: Pt was offered support and  encouragement. Pt was given scheduled medications. Pt was encouraged to attend groups. Q 15 minute checks were done for safety.  R:Pt attends groups and interacts well with peers and staff. Pt is taking medication. Pt has no complaints.Pt receptive to treatment and safety maintained on unit.  Principal Problem: Major depressive disorder, recurrent severe without psychotic features ([place]) Diagnosis:   Patient Active Problem List   Diagnosis Date Noted  . Cannabis use disorder, moderate, dependence ([employer]) [F12.20] 11/04/2016  . Tobacco use disorder [F17.200] 11/04/2016  . Severe recurrent major depression without psychotic features ([place]) [F33.2] 11/04/2016  . Substance induced mood disorder ([place]) [F19.94] 10/13/2016  . Diabetes mellitus without complication ([place]) [X41.2] 06/08/2016  . Anxiety state [F41.1]   . Polysubstance abuse [F19.10]   . Major depressive disorder, recurrent severe without psychotic features ([place]) [F33.2] 04/13/2016   Total Time spent with patient: 20 minutes  Past Psychiatric History: Past history of recurrent hospitalizations for severe major depression history of suicide attempts. History of some response to medicine in the past but also a major history of substance abuse problems  Past Medical History:  Past Medical History:  Diagnosis Date  . Depression   . Diabetes mellitus without complication [facility])     Past Surgical History:  Procedure Laterality Date  . INGUINAL HERNIA REPAIR    . LUMBAR FUSION     Family History: History reviewed. No pertinent family history. Family Psychiatric  History: Positive for substance abuse Social History:  History  Alcohol Use No    Comment: denies use     History  Drug Use  . Types: Marijuana, Cocaine  Comment: Last used in february 2018    Social History   Social History  . Marital status: Single    Spouse name: N/A  . Number of children: N/A  . Years of education: N/A   Social History Main Topics  .  Smoking status: Current Every Day Smoker    Packs/day: 0.50    Types: Cigarettes  . Smokeless tobacco: Never Used  . Alcohol use No     Comment: denies use  . Drug use: Yes    Types: Marijuana, Cocaine     Comment: Last used in february 2018  . Sexual activity: No   Other Topics Concern  . None   Social History Narrative  . None   Additional Social History:    History of alcohol / drug use?: Yes Name of Substance 1: marijuana 1 - Last Use / Amount: UDS positive, but pt reports last use in february 2018 Name of Substance 2: Cocaine  2 - Last Use / Amount: UDS negative, reports last use in february 2018                Sleep: Fair  Appetite:  Fair  Current Medications: Current Facility-Administered Medications  Medication Dose Route Frequency Provider Last Rate Last Dose  . acetaminophen (TYLENOL) tablet 650 mg  650 mg Oral Q6H PRN Gonzella Lex, MD      . alum & mag hydroxide-simeth (MAALOX/MYLANTA) 200-200-20 MG/5ML suspension 30 mL  30 mL Oral Q4H PRN Gonzella Lex, MD      . desvenlafaxine (PRISTIQ) 24 hr tablet 50 mg  50 mg Oral Daily Yuval Nolet B Lavaun Greenfield, MD   50 mg at 11/08/16 0825  . feeding supplement (ENSURE ENLIVE) (ENSURE ENLIVE) liquid 237 mL  237 mL Oral BID BM Abisai Coble B Janmarie Smoot, MD   237 mL at 11/07/16 1400  . gemfibrozil (LOPID) tablet 600 mg  600 mg Oral BID AC Tessah Patchen B Balthazar Dooly, MD   600 mg at 11/08/16 0825  . hydrOXYzine (ATARAX/VISTARIL) tablet 50 mg  50 mg Oral TID PRN Clovis Fredrickson, MD   50 mg at 11/05/16 1304  . ibuprofen (ADVIL,MOTRIN) tablet 800 mg  800 mg Oral Q6H PRN Clovis Fredrickson, MD   800 mg at 11/07/16 2201  . insulin aspart (novoLOG) injection 0-15 Units  0-15 Units Subcutaneous TID WC Clovis Fredrickson, MD   2 Units at 11/08/16 0827  . insulin aspart (novoLOG) injection 0-5 Units  0-5 Units Subcutaneous QHS Clovis Fredrickson, MD   2 Units at 11/07/16 2201  . magnesium hydroxide (MILK OF MAGNESIA) suspension 30 mL   30 mL Oral Daily PRN Gonzella Lex, MD      . metFORMIN (GLUCOPHAGE) tablet 500 mg  500 mg Oral BID WC Gonzella Lex, MD   500 mg at 11/08/16 0825  . nicotine (NICODERM CQ - dosed in mg/24 hours) patch 21 mg  21 mg Transdermal Daily Fairley Copher B Devonne Lalani, MD   21 mg at 11/07/16 0847  . penicillin v potassium (VEETID) tablet 500 mg  500 mg Oral Q6H Gonzella Lex, MD   500 mg at 11/08/16 0649  . traZODone (DESYREL) tablet 100 mg  100 mg Oral QHS PRN Gonzella Lex, MD   100 mg at 11/07/16 2157    Lab Results:  Results for orders placed or performed during the hospital encounter of 11/04/16 (from the past 48 hour(s))  Glucose, capillary     Status: Abnormal   Collection Time:  11/06/16 11:33 AM  Result Value Ref Range   Glucose-Capillary 269 (H) 65 - 99 mg/dL   Comment 1 Notify RN   Glucose, capillary     Status: Abnormal   Collection Time: 11/06/16  4:41 PM  Result Value Ref Range   Glucose-Capillary 133 (H) 65 - 99 mg/dL  Glucose, capillary     Status: Abnormal   Collection Time: 11/06/16  8:54 PM  Result Value Ref Range   Glucose-Capillary 194 (H) 65 - 99 mg/dL  Glucose, capillary     Status: Abnormal   Collection Time: 11/07/16  6:45 AM  Result Value Ref Range   Glucose-Capillary 174 (H) 65 - 99 mg/dL  Glucose, capillary     Status: Abnormal   Collection Time: 11/07/16 11:24 AM  Result Value Ref Range   Glucose-Capillary 217 (H) 65 - 99 mg/dL  Glucose, capillary     Status: Abnormal   Collection Time: 11/07/16  4:33 PM  Result Value Ref Range   Glucose-Capillary 108 (H) 65 - 99 mg/dL  Glucose, capillary     Status: Abnormal   Collection Time: 11/07/16  8:58 PM  Result Value Ref Range   Glucose-Capillary 201 (H) 65 - 99 mg/dL  Glucose, capillary     Status: Abnormal   Collection Time: 11/08/16  6:53 AM  Result Value Ref Range   Glucose-Capillary 138 (H) 65 - 99 mg/dL    Blood Alcohol level:  Lab Results  Component Value Date   ETH <5 11/03/2016   ETH <5 07/37/1062     Metabolic Disorder Labs: Lab Results  Component Value Date   HGBA1C 8.3 (H) 10/13/2016   MPG 192 10/13/2016   MPG 140 04/15/2016   No results found for: PROLACTIN Lab Results  Component Value Date   CHOL 210 (H) 11/05/2016   TRIG 170 (H) 11/05/2016   HDL 23 (L) 11/05/2016   CHOLHDL 9.1 11/05/2016   VLDL 34 11/05/2016   LDLCALC 153 (H) 11/05/2016   LDLCALC 138 (H) 04/15/2016    Physical Findings: AIMS: Facial and Oral Movements Muscles of Facial Expression: None, normal Lips and Perioral Area: None, normal Jaw: None, normal Tongue: None, normal,Extremity Movements Upper (arms, wrists, hands, fingers): None, normal Lower (legs, knees, ankles, toes): None, normal, Trunk Movements Neck, shoulders, hips: None, normal, Overall Severity Severity of abnormal movements (highest score from questions above): None, normal Incapacitation due to abnormal movements: None, normal Patient's awareness of abnormal movements (rate only patient's report): No Awareness, Dental Status Current problems with teeth and/or dentures?: Yes (R upper molar loose, painful) Does patient usually wear dentures?: No  CIWA:    COWS:     Musculoskeletal: Strength & Muscle Tone: within normal limits Gait & Station: normal Patient leans: N/A  Psychiatric Specialty Exam: Physical Exam  Nursing note and vitals reviewed. Psychiatric: His behavior is normal. Cognition and memory are normal. He expresses impulsivity. He exhibits a depressed mood.    Review of Systems  Psychiatric/Behavioral: Positive for depression and substance abuse.  All other systems reviewed and are negative.   Blood pressure 133/87, pulse 70, temperature 98.2 F (36.8 C), temperature source Oral, resp. rate 18, height 5\' 10"  (1.778 m), weight 75.8 kg (167 lb), SpO2 100 %.Body mass index is 23.96 kg/m.  General Appearance: Fairly Groomed  Eye Contact:  Good  Speech:  Normal Rate  Volume:  Normal  Mood:  Depressed  Affect:   Appropriate  Thought Process:  Goal Directed  Orientation:  Full (Time, Place, and  Person)  Thought Content:  Logical  Suicidal Thoughts:  No  Homicidal Thoughts:  No  Memory:  Immediate;   Good Recent;   Good Remote;   Good  Judgement:  Fair  Insight:  Fair  Psychomotor Activity:  Normal  Concentration:  Concentration: Fair  Recall:  [place] of Knowledge:  Fair  Language:  Fair  Akathisia:  No  Handed:  Right  AIMS (if indicated):     Assets:  Communication Skills Desire for Improvement Physical Health Resilience  ADL's:  Intact  Cognition:  WNL  Sleep:  Number of Hours: 7     Treatment Plan Summary: Daily contact with patient to assess and evaluate symptoms and progress in treatment, Medication management and Plan No change to medicine. Patient is anxious about being able to afford his medicine after discharge. I told him I will look up some information about the Pristiq patient assistance program. Encourage his attendance at group.   Edgar White is a 51 year old male with a history of depression, anxiety, mood instability, and substance abuse admitted for worsening of depression and suicidal ideation with a plan to overdose in the context of severe social stressors.  1. Suicidal ideation. The patient is able to contract for safety in the hospital.  2. Mood. He was started on Pristiq that was helpful in the past.  3. Diabetes. He is on metformin and ADA. His sugars are elevated. He receives Novolog according to sliding scale. Hemoglobin A1c is elevated at 8.3   4. Anxiety. Vistaril is available.   5. Smoking. Nicotine patch is available.  6. Insomnia. Trazodone is available.  7. Ear infection. He is on penicillin for 39 doses.  8. Metabolic syndrome monitoring. Lipid panel shows elevated cholesterol and triglycerides. We'll start Lopid.  9. Disposition. To be established. The patient is homeless. Orson Slick, MD 11/08/2016, 9:43 AM

## 2016-11-08 NOTE — Progress Notes (Signed)
D: Pt denies SI/HI/AVH. Pt is pleasant and cooperative, affect is flat and sad, but brightens upon approach.  Pt  appears less anxious and he is interacting with peers and staff appropriately.  A: Pt was offered support and encouragement. Pt was given scheduled medications. Pt was encouraged to attend groups. Q 15 minute checks were done for safety.  R:Pt attends groups and interacts well with peers and staff. Pt is taking medication. Pt has no complaints.Pt receptive to treatment and safety maintained on unit.

## 2016-11-08 NOTE — Progress Notes (Signed)
Recreation Therapy Notes  Date: 03.20.18 Time: 1:00 pm Location: Craft Room  Group Topic: Self-expression  Goal Area(s) Addresses:  Patient will be able to identify a color that represents each emotion. Patient will verbalize benefit of using art as a means of self-expression. Patient will verbalize one emotion experienced while participating in activity.  Behavioral Response: Attentive, Interactive  Intervention: The Colors Within Me  Activity: Patients were given blank face worksheets and were instructed to pick a color for each emotion and to show on the worksheet how much of that emotion they were feeling.  Education: LRT educated patients on other forms of self-expression.  Education Outcome: Acknowledges education/In group clarification offered   Clinical Observations/Feedback: Patient picked a color for each emotion he was feeling and showed on the worksheet how much of that emotion he was feeling. Patient contributed to group discussion by stating what emotions he was feeling, how his emotions affect his treatment in the hospital, that his emotions are dynamic, what causes his emotions to change, how he sees his emotions changing once he is able to d/c, that it was helpful to see his emotions and why, and what he can do to help him feel more positive emotions.  Leonette Monarch, LRT/CTRS 11/08/2016 4:08 PM

## 2016-11-09 LAB — GLUCOSE, CAPILLARY
GLUCOSE-CAPILLARY: 126 mg/dL — AB (ref 65–99)
GLUCOSE-CAPILLARY: 168 mg/dL — AB (ref 65–99)
Glucose-Capillary: 149 mg/dL — ABNORMAL HIGH (ref 65–99)
Glucose-Capillary: 188 mg/dL — ABNORMAL HIGH (ref 65–99)

## 2016-11-09 MED ORDER — TRAZODONE HCL 100 MG PO TABS: 100.0000 mg | ORAL_TABLET | Freq: Every day | ORAL | 1 refills | Status: DC

## 2016-11-09 MED ORDER — METFORMIN HCL 1000 MG PO TABS: 1000.0000 mg | ORAL_TABLET | Freq: Two times a day (BID) | ORAL | 1 refills | Status: DC

## 2016-11-09 MED ORDER — HYDROXYZINE HCL 50 MG PO TABS: 50.0000 mg | ORAL_TABLET | Freq: Three times a day (TID) | ORAL | 1 refills | Status: DC | PRN

## 2016-11-09 MED ORDER — GEMFIBROZIL 600 MG PO TABS: 600.0000 mg | ORAL_TABLET | Freq: Two times a day (BID) | ORAL | 1 refills | Status: DC

## 2016-11-09 MED ORDER — DESVENLAFAXINE SUCCINATE ER 50 MG PO TB24: 50.0000 mg | ORAL_TABLET | Freq: Every day | ORAL | 1 refills | Status: DC

## 2016-11-09 MED ORDER — PENICILLIN V POTASSIUM 500 MG PO TABS: 500.0000 mg | ORAL_TABLET | Freq: Four times a day (QID) | ORAL | 0 refills | Status: DC

## 2016-11-09 MED ORDER — METFORMIN HCL 500 MG PO TABS
1000.0000 mg | ORAL_TABLET | Freq: Two times a day (BID) | ORAL | Status: DC
  Administered 2016-11-09 – 2016-11-10 (×2): 1000 mg via ORAL
  Filled 2016-11-09 (×2): qty 2

## 2016-11-09 MED ORDER — HYDROXYZINE HCL 50 MG PO TABS
50.0000 mg | ORAL_TABLET | Freq: Three times a day (TID) | ORAL | 1 refills | Status: DC | PRN
Start: 2016-11-09 — End: 2016-11-09

## 2016-11-09 MED ORDER — TRAZODONE HCL 100 MG PO TABS
100.0000 mg | ORAL_TABLET | Freq: Every day | ORAL | Status: DC
  Administered 2016-11-09: 100 mg via ORAL
  Filled 2016-11-09: qty 1

## 2016-11-09 MED ORDER — IBUPROFEN 800 MG PO TABS: 800.0000 mg | ORAL_TABLET | Freq: Four times a day (QID) | ORAL | 0 refills | Status: DC | PRN

## 2016-11-09 MED ORDER — BENZOCAINE 10 % MT GEL: Freq: Three times a day (TID) | OROMUCOSAL | 1 refills | Status: DC | PRN

## 2016-11-09 NOTE — BHH Group Notes (Signed)
  [facility] LCSW Group Therapy Note  Date/Time: 11/09/16, 0930  Type of Therapy/Topic:  Group Therapy:  Emotion Regulation  Participation Level:  Active   Mood: pleasant  Description of Group:    The purpose of this group is to assist patients in learning to regulate negative emotions and experience positive emotions. Patients will be guided to discuss ways in which they have been vulnerable to their negative emotions. These vulnerabilities will be juxtaposed with experiences of positive emotions or situations, and patients challenged to use positive emotions to combat negative ones. Special emphasis will be placed on coping with negative emotions in conflict situations, and patients will process healthy conflict resolution skills.  Therapeutic Goals: 1. Patient will identify two positive emotions or experiences to reflect on in order to balance out negative emotions:  2. Patient will label two or more emotions that they find the most difficult to experience:  3. Patient will be able to demonstrate positive conflict resolution skills through discussion or role plays:   Summary of Patient Progress: PT shared that anger, depression, and sadness are emotions that are difficult for him to experience.  He shared that he has had trouble identifying strategies that are effective for coping when he is feeling those emotions.  He did not spontaneously contribute to group, however, he was open in sharing when called upon by CSW.       Therapeutic Modalities:   Cognitive Behavioral Therapy Feelings Identification Dialectical Behavioral Therapy  Lurline Idol, LCSW

## 2016-11-09 NOTE — Plan of Care (Signed)
Problem: [facility] Participation in Recreation Therapeutic Interventions Goal: STG-Patient will demonstrate improved self esteem by identif STG: Self-Esteem - Within 4 treatment sessions, patient will verbalize at least 5 positive affirmation statements in each of 2 treatment sessions to increase self-esteem.  Outcome: Completed/Met Date Met: 11/09/16 Treatment Session 2; Completed 2 out of 2: At approximately 8:30 am, LRT met with patient in consultation room. Patient verbalized 5 positive affirmation statements. Patient reported it felt "good". LRT encouraged patient to continue saying positive affirmation statements.  Leonette Monarch, LRT/CTRS 03.21.18 12:17 pm Goal: STG-Patient will identify at least five coping skills for ** STG: Coping Skills - Within 4 treatment sessions, patient will verbalize at least 5 new coping skills in each of 2 treatment sessions to increase healthy coping skills.  Outcome: Completed/Met Date Met: 11/09/16 Treatment Session 2; Completed 2 out of 2: At approximately 8:30 am, LRT met with patient in consultation room. Patient verbalized 5 new coping skills. LRT encouraged patient to continue thinking of healthy coping skills and to practice using his coping skills  Leonette Monarch, LRT/CTRS 03.21.18 12:21 pm

## 2016-11-09 NOTE — Progress Notes (Signed)
[facility] MD Progress Note  11/09/2016 2:20 PM Edgar White  MRN:  [id number] Subjective:    11/05/2016. Patient with a history of major depression and anxiety and substance abuse admitted to the hospital with suicidal ideation and severe depression. Today the patient is neatly dressed and groomed. He is attending groups and behaving appropriately. In interview however he still describes his mood is very anxious and hopeless with suicidal thoughts. Feels very nervous about not having a place to stay or any good outpatient plan. Affect is dysphoric but reactive. No sign of psychosis. Tolerating medicinespecific new physical complaint  11/06/2016. Follow-up Sunday the 18th. Patient seen. His mood he says is a little bit better. Not having active thoughts of suicide. Slept a little better last night. He is neatly dressed and attends groups and interacts very appropriately.  11/07/2016. Ms. Gilmer still is very depressed and utterly hopeless. He is homeless and very frightened about his disposition. He could go with his brother but needs to get a job first. He is interested in substance abuse treatment but has been positive for marijuana on admission. There is a history of substance use in the past year or so. There are no somatic complaints. Good group participation. He accepts medications and tolerates them well.  11/08/2016. Edgar White seems improved but he is incapacitated with anxiety about discharge and is unable to contract for safety in the community. Unfortunately, multiple attempts to contact his brother have failed. His only option is discharge to the [facility]. He accepts medications and tolerates them well, eats and sleeps well, good program participation.  11/09/2016. Edgar White met with treatment team today. He was able to participate in discharge planning. He would prefer to go with his brother but will accept homeless shelter if necessary. No somatic complaints. Sleep and appetite ar good. No side  effects from medications. Anticipated discharge tomorrow.   Per nursing: Edgar White was pleasant on approach. He was medication compliant on shift, he denies SI/HI/AVH. He still reports still being depressed. Support and encouragement offered. Safety checks maintained. .  Principal Problem: Major depressive disorder, recurrent severe without psychotic features ([place]) Diagnosis:   Patient Active Problem List   Diagnosis Date Noted  . Cannabis use disorder, moderate, dependence ([place]) [F12.20] 11/04/2016  . Tobacco use disorder [F17.200] 11/04/2016  . Severe recurrent major depression without psychotic features ([place]) [F33.2] 11/04/2016  . Substance induced mood disorder ([place]) [F19.94] 10/13/2016  . Diabetes mellitus without complication ([place]) [N39.7] 06/08/2016  . Anxiety state [F41.1]   . Polysubstance abuse [F19.10]   . Major depressive disorder, recurrent severe without psychotic features ([place]) [F33.2] 04/13/2016   Total Time spent with patient: 20 minutes  Past Psychiatric History: Past history of recurrent hospitalizations for severe major depression history of suicide attempts. History of some response to medicine in the past but also a major history of substance abuse problems  Past Medical History:  Past Medical History:  Diagnosis Date  . Depression   . Diabetes mellitus without complication [facility])     Past Surgical History:  Procedure Laterality Date  . INGUINAL HERNIA REPAIR    . LUMBAR FUSION     Family History: History reviewed. No pertinent family history. Family Psychiatric  History: Positive for substance abuse Social History:  History  Alcohol Use No    Comment: denies use     History  Drug Use  . Types: Marijuana, Cocaine    Comment: Last used in february 2018    Social History  Social History  . Marital status: Single    Spouse name: N/A  . Number of children: N/A  . Years of education: N/A   Social History Main Topics  . Smoking status: Current Every  Day Smoker    Packs/day: 0.50    Types: Cigarettes  . Smokeless tobacco: Never Used  . Alcohol use No     Comment: denies use  . Drug use: Yes    Types: Marijuana, Cocaine     Comment: Last used in february 2018  . Sexual activity: No   Other Topics Concern  . None   Social History Narrative  . None   Additional Social History:    History of alcohol / drug use?: Yes Name of Substance 1: marijuana 1 - Last Use / Amount: UDS positive, but pt reports last use in february 2018 Name of Substance 2: Cocaine  2 - Last Use / Amount: UDS negative, reports last use in february 2018                Sleep: Fair  Appetite:  Fair  Current Medications: Current Facility-Administered Medications  Medication Dose Route Frequency Provider Last Rate Last Dose  . acetaminophen (TYLENOL) tablet 650 mg  650 mg Oral Q6H PRN Gonzella Lex, MD      . alum & mag hydroxide-simeth (MAALOX/MYLANTA) 200-200-20 MG/5ML suspension 30 mL  30 mL Oral Q4H PRN Gonzella Lex, MD      . desvenlafaxine (PRISTIQ) 24 hr tablet 50 mg  50 mg Oral Daily Clovis Fredrickson, MD   50 mg at 11/09/16 6503  . feeding supplement (ENSURE ENLIVE) (ENSURE ENLIVE) liquid 237 mL  237 mL Oral BID BM Jolanta B Pucilowska, MD   237 mL at 11/09/16 1000  . gemfibrozil (LOPID) tablet 600 mg  600 mg Oral BID AC Jolanta B Pucilowska, MD   600 mg at 11/09/16 5465  . hydrOXYzine (ATARAX/VISTARIL) tablet 50 mg  50 mg Oral TID PRN Clovis Fredrickson, MD   50 mg at 11/05/16 1304  . ibuprofen (ADVIL,MOTRIN) tablet 800 mg  800 mg Oral Q6H PRN Clovis Fredrickson, MD   800 mg at 11/07/16 2201  . insulin aspart (novoLOG) injection 0-15 Units  0-15 Units Subcutaneous TID WC Clovis Fredrickson, MD   3 Units at 11/09/16 1210  . insulin aspart (novoLOG) injection 0-5 Units  0-5 Units Subcutaneous QHS Clovis Fredrickson, MD   2 Units at 11/07/16 2201  . magnesium hydroxide (MILK OF MAGNESIA) suspension 30 mL  30 mL Oral Daily PRN Gonzella Lex, MD      . metFORMIN (GLUCOPHAGE) tablet 1,000 mg  1,000 mg Oral BID WC Jolanta B Pucilowska, MD      . nicotine (NICODERM CQ - dosed in mg/24 hours) patch 21 mg  21 mg Transdermal Daily Jolanta B Pucilowska, MD   21 mg at 11/07/16 0847  . penicillin v potassium (VEETID) tablet 500 mg  500 mg Oral Q6H Gonzella Lex, MD   500 mg at 11/09/16 1208  . traZODone (DESYREL) tablet 100 mg  100 mg Oral QHS Clovis Fredrickson, MD        Lab Results:  Results for orders placed or performed during the hospital encounter of 11/04/16 (from the past 48 hour(s))  Glucose, capillary     Status: Abnormal   Collection Time: 11/07/16  4:33 PM  Result Value Ref Range   Glucose-Capillary 108 (H) 65 - 99 mg/dL  Glucose,  capillary     Status: Abnormal   Collection Time: 11/07/16  8:58 PM  Result Value Ref Range   Glucose-Capillary 201 (H) 65 - 99 mg/dL  Glucose, capillary     Status: Abnormal   Collection Time: 11/08/16  6:53 AM  Result Value Ref Range   Glucose-Capillary 138 (H) 65 - 99 mg/dL  Glucose, capillary     Status: Abnormal   Collection Time: 11/08/16 11:33 AM  Result Value Ref Range   Glucose-Capillary 264 (H) 65 - 99 mg/dL   Comment 1 Notify RN   Glucose, capillary     Status: Abnormal   Collection Time: 11/08/16  4:33 PM  Result Value Ref Range   Glucose-Capillary 175 (H) 65 - 99 mg/dL   Comment 1 Notify RN   Glucose, capillary     Status: Abnormal   Collection Time: 11/08/16  9:16 PM  Result Value Ref Range   Glucose-Capillary 162 (H) 65 - 99 mg/dL  Glucose, capillary     Status: Abnormal   Collection Time: 11/09/16  6:40 AM  Result Value Ref Range   Glucose-Capillary 149 (H) 65 - 99 mg/dL  Glucose, capillary     Status: Abnormal   Collection Time: 11/09/16 11:35 AM  Result Value Ref Range   Glucose-Capillary 188 (H) 65 - 99 mg/dL   Comment 1 Document in Chart     Blood Alcohol level:  Lab Results  Component Value Date   ETH <5 11/03/2016   ETH <5 81/85/6314     Metabolic Disorder Labs: Lab Results  Component Value Date   HGBA1C 8.3 (H) 10/13/2016   MPG 192 10/13/2016   MPG 140 04/15/2016   No results found for: PROLACTIN Lab Results  Component Value Date   CHOL 210 (H) 11/05/2016   TRIG 170 (H) 11/05/2016   HDL 23 (L) 11/05/2016   CHOLHDL 9.1 11/05/2016   VLDL 34 11/05/2016   LDLCALC 153 (H) 11/05/2016   LDLCALC 138 (H) 04/15/2016    Physical Findings: AIMS: Facial and Oral Movements Muscles of Facial Expression: None, normal Lips and Perioral Area: None, normal Jaw: None, normal Tongue: None, normal,Extremity Movements Upper (arms, wrists, hands, fingers): None, normal Lower (legs, knees, ankles, toes): None, normal, Trunk Movements Neck, shoulders, hips: None, normal, Overall Severity Severity of abnormal movements (highest score from questions above): None, normal Incapacitation due to abnormal movements: None, normal Patient's awareness of abnormal movements (rate only patient's report): No Awareness, Dental Status Current problems with teeth and/or dentures?: Yes (R upper molar loose, painful) Does patient usually wear dentures?: No  CIWA:    COWS:     Musculoskeletal: Strength & Muscle Tone: within normal limits Gait & Station: normal Patient leans: N/A  Psychiatric Specialty Exam: Physical Exam  Nursing note and vitals reviewed. Psychiatric: His behavior is normal. Cognition and memory are normal. He expresses impulsivity. He exhibits a depressed mood.    Review of Systems  Psychiatric/Behavioral: Positive for depression and substance abuse.  All other systems reviewed and are negative.   Blood pressure 124/85, pulse 68, temperature 98 F (36.7 C), resp. rate 18, height 5' 10"  (1.778 m), weight 75.8 kg (167 lb), SpO2 100 %.Body mass index is 23.96 kg/m.  General Appearance: Fairly Groomed  Eye Contact:  Good  Speech:  Normal Rate  Volume:  Normal  Mood:  Depressed  Affect:  Appropriate  Thought  Process:  Goal Directed  Orientation:  Full (Time, Place, and Person)  Thought Content:  Logical  Suicidal  Thoughts:  No  Homicidal Thoughts:  No  Memory:  Immediate;   Good Recent;   Good Remote;   Good  Judgement:  Fair  Insight:  Fair  Psychomotor Activity:  Normal  Concentration:  Concentration: Fair  Recall:  [employer] of Knowledge:  Fair  Language:  Fair  Akathisia:  No  Handed:  Right  AIMS (if indicated):     Assets:  Communication Skills Desire for Improvement Physical Health Resilience  ADL's:  Intact  Cognition:  WNL  Sleep:  Number of Hours: 7     Treatment Plan Summary: Daily contact with patient to assess and evaluate symptoms and progress in treatment, Medication management and Plan No change to medicine. Patient is anxious about being able to afford his medicine after discharge. I told him I will look up some information about the Pristiq patient assistance program. Encourage his attendance at group.   Edgar White is a 51 year old male with a history of depression, anxiety, mood instability, and substance abuse admitted for worsening of depression and suicidal ideation with a plan to overdose in the context of severe social stressors.  1. Suicidal ideation. The patient is able to contract for safety in the hospital.  2. Mood. He was started on Pristiq that was helpful in the past.  3. Diabetes. He is on metformin and ADA. His sugars are elevated. He receives Novolog according to sliding scale. Hemoglobin A1c is elevated at 8.3   4. Anxiety. Vistaril is available.   5. Smoking. Nicotine patch is available.  6. Insomnia. Trazodone is available.  7. Ear infection. He is on penicillin for 39 doses.  8. Metabolic syndrome monitoring. Lipid panel shows elevated cholesterol and triglycerides. We'll start Lopid.  9. Disposition. To be established. The patient is homeless. Orson Slick, MD 11/09/2016, 2:20 PM

## 2016-11-09 NOTE — Tx Team (Signed)
Interdisciplinary Treatment and Diagnostic Plan Update *Late entry 11/09/2016 Time of Session: 10:30 AM Edgar White MRN: [id number]  Principal Diagnosis: Major depressive disorder, recurrent severe without psychotic features ([place])  Secondary Diagnoses: Principal Problem:   Major depressive disorder, recurrent severe without psychotic features ([place]) Active Problems:   Diabetes mellitus without complication ([place])   Cannabis use disorder, moderate, dependence ([place])   Tobacco use disorder   Severe recurrent major depression without psychotic features ([place])   Current Medications:  Current Facility-Administered Medications  Medication Dose Route Frequency Provider Last Rate Last Dose  . acetaminophen (TYLENOL) tablet 650 mg  650 mg Oral Q6H PRN Gonzella Lex, MD      . alum & mag hydroxide-simeth (MAALOX/MYLANTA) 200-200-20 MG/5ML suspension 30 mL  30 mL Oral Q4H PRN Gonzella Lex, MD      . desvenlafaxine (PRISTIQ) 24 hr tablet 50 mg  50 mg Oral Daily Clovis Fredrickson, MD   50 mg at 11/09/16 4098  . feeding supplement (ENSURE ENLIVE) (ENSURE ENLIVE) liquid 237 mL  237 mL Oral BID BM Jolanta B Pucilowska, MD   237 mL at 11/08/16 1400  . gemfibrozil (LOPID) tablet 600 mg  600 mg Oral BID AC Jolanta B Pucilowska, MD   600 mg at 11/09/16 1191  . hydrOXYzine (ATARAX/VISTARIL) tablet 50 mg  50 mg Oral TID PRN Clovis Fredrickson, MD   50 mg at 11/05/16 1304  . ibuprofen (ADVIL,MOTRIN) tablet 800 mg  800 mg Oral Q6H PRN Clovis Fredrickson, MD   800 mg at 11/07/16 2201  . insulin aspart (novoLOG) injection 0-15 Units  0-15 Units Subcutaneous TID WC Clovis Fredrickson, MD   2 Units at 11/09/16 4782  . insulin aspart (novoLOG) injection 0-5 Units  0-5 Units Subcutaneous QHS Clovis Fredrickson, MD   2 Units at 11/07/16 2201  . magnesium hydroxide (MILK OF MAGNESIA) suspension 30 mL  30 mL Oral Daily PRN Gonzella Lex, MD      . metFORMIN (GLUCOPHAGE) tablet 500 mg  500 mg Oral BID WC Gonzella Lex, MD   500 mg at 11/09/16 9562  . nicotine (NICODERM CQ - dosed in mg/24 hours) patch 21 mg  21 mg Transdermal Daily Jolanta B Pucilowska, MD   21 mg at 11/07/16 0847  . penicillin v potassium (VEETID) tablet 500 mg  500 mg Oral Q6H Gonzella Lex, MD   500 mg at 11/09/16 1308  . traZODone (DESYREL) tablet 100 mg  100 mg Oral QHS PRN Gonzella Lex, MD   100 mg at 11/08/16 2140   PTA Medications: Prescriptions Prior to Admission  Medication Sig Dispense Refill Last Dose  . amoxicillin (AMOXIL) 500 MG capsule Take 1 capsule (500 mg total) by mouth every 8 (eight) hours. For tooth infection (Patient not taking: Reported on 11/04/2016) 11 capsule 0 Completed Course at Unknown time  . benzocaine (ORAJEL) 10 % mucosal gel Use as directed in the mouth or throat 3 (three) times daily as needed for mouth pain. 5.3 g 0   . hydrOXYzine (ATARAX/VISTARIL) 25 MG tablet Take 1 tablet (25 mg total) by mouth every 6 (six) hours as needed for anxiety. 60 tablet 0   . metFORMIN (GLUCOPHAGE) 500 MG tablet Take 1 tablet (500 mg total) by mouth 2 (two) times daily with a meal. For diabetes management 60 tablet 0   . nicotine (NICODERM CQ - DOSED IN MG/24 HOURS) 21 mg/24hr patch Place 1 patch (21 mg total)  onto the skin daily. For smoking cessation 28 patch 0   . traZODone (DESYREL) 50 MG tablet Take 1 tablet (50 mg total) by mouth at bedtime as needed for sleep. 30 tablet 0   . venlafaxine XR (EFFEXOR-XR) 150 MG 24 hr capsule Take 1 capsule (150 mg total) by mouth daily with breakfast. For depression 30 capsule 0     Patient Stressors: Financial difficulties Marital or family conflict Substance abuse  Patient Strengths: Capable of independent living Motivation for treatment/growth Physical Health  Treatment Modalities: Medication Management, Group therapy, Case management,  1 to 1 session with clinician, Psychoeducation, Recreational therapy.   Physician Treatment Plan for Primary Diagnosis: Major  depressive disorder, recurrent severe without psychotic features ([place]) Long Term Goal(s): Improvement in symptoms so as ready for discharge Improvement in symptoms so as ready for discharge   Short Term Goals: Ability to identify changes in lifestyle to reduce recurrence of condition will improve Ability to verbalize feelings will improve Ability to disclose and discuss suicidal ideas Ability to demonstrate self-control will improve Ability to identify and develop effective coping behaviors will improve Ability to maintain clinical measurements within normal limits will improve Compliance with prescribed medications will improve Ability to identify triggers associated with substance abuse/mental health issues will improve Ability to identify changes in lifestyle to reduce recurrence of condition will improve Ability to demonstrate self-control will improve Ability to identify triggers associated with substance abuse/mental health issues will improve  Medication Management: Evaluate patient's response, side effects, and tolerance of medication regimen.  Therapeutic Interventions: 1 to 1 sessions, Unit Group sessions and Medication administration.  Evaluation of Outcomes: Progressing  Physician Treatment Plan for Secondary Diagnosis: Principal Problem:   Major depressive disorder, recurrent severe without psychotic features ([place]) Active Problems:   Diabetes mellitus without complication ([place])   Cannabis use disorder, moderate, dependence ([place])   Tobacco use disorder   Severe recurrent major depression without psychotic features ([place])  Long Term Goal(s): Improvement in symptoms so as ready for discharge Improvement in symptoms so as ready for discharge   Short Term Goals: Ability to identify changes in lifestyle to reduce recurrence of condition will improve Ability to verbalize feelings will improve Ability to disclose and discuss suicidal ideas Ability to demonstrate self-control  will improve Ability to identify and develop effective coping behaviors will improve Ability to maintain clinical measurements within normal limits will improve Compliance with prescribed medications will improve Ability to identify triggers associated with substance abuse/mental health issues will improve Ability to identify changes in lifestyle to reduce recurrence of condition will improve Ability to demonstrate self-control will improve Ability to identify triggers associated with substance abuse/mental health issues will improve     Medication Management: Evaluate patient's response, side effects, and tolerance of medication regimen.  Therapeutic Interventions: 1 to 1 sessions, Unit Group sessions and Medication administration.  Evaluation of Outcomes: Progressing   RN Treatment Plan for Primary Diagnosis: Major depressive disorder, recurrent severe without psychotic features ([place]) Long Term Goal(s): Knowledge of disease and therapeutic regimen to maintain health will improve  Short Term Goals: Ability to remain free from injury will improve, Ability to disclose and discuss suicidal ideas and Compliance with prescribed medications will improve  Medication Management: RN will administer medications as ordered by provider, will assess and evaluate patient's response and provide education to patient for prescribed medication. RN will report any adverse and/or side effects to prescribing provider.  Therapeutic Interventions: 1 on 1 counseling sessions, Psychoeducation, Medication administration, Evaluate responses  to treatment, Monitor vital signs and CBGs as ordered, Perform/monitor CIWA, COWS, AIMS and Fall Risk screenings as ordered, Perform wound care treatments as ordered.  Evaluation of Outcomes: Progressing   LCSW Treatment Plan for Primary Diagnosis: Major depressive disorder, recurrent severe without psychotic features ([place]) Long Term Goal(s): Safe transition to appropriate  next level of care at discharge, Engage patient in therapeutic group addressing interpersonal concerns.  Short Term Goals: Engage patient in aftercare planning with referrals and resources, Increase social support, Increase emotional regulation and Identify triggers associated with mental health/substance abuse issues  Therapeutic Interventions: Assess for all discharge needs, 1 to 1 time with Social worker, Explore available resources and support systems, Assess for adequacy in community support network, Educate family and significant other(s) on suicide prevention, Complete Psychosocial Assessment, Interpersonal group therapy.  Evaluation of Outcomes: Progressing    Recreational Therapy Treatment Plan for Primary Diagnosis: Major depressive disorder, recurrent severe without psychotic features ([place]) Long Term Goal(s): Patient will participate in recreation therapy treatment in at least 2 group sessions without prompting from LRT  Short Term Goals: Increase self-esteem, Increase healthy coping skills  Treatment Modalities: Group Therapy and Individual Treatment Sessions  Therapeutic Interventions: Psychoeducation  Evaluation of Outcomes: Progressing   Progress in Treatment: Attending groups: Yes. Participating in groups: Yes. Taking medication as prescribed: Yes. Toleration medication: Yes. Family/Significant other contact made: Yes, individual(s) contacted:  CSW spoke with pt's sister. Patient understands diagnosis: Yes. Discussing patient identified problems/goals with staff: Yes. Medical problems stabilized or resolved: Yes. Denies suicidal/homicidal ideation: Yes. Issues/concerns per patient self-inventory: No.  New problem(s) identified: Yes: homelessness   New Short Term/Long Term Goal(s): Pt's goal is to locate stable housing and progress in recovery.   Discharge Plan or Barriers: CSW still assessing proper aftercare plans.  Reason for Continuation of Hospitalization:  Depression, suicidal ideation  Estimated Length of Stay: 2-3 days   Attendees: Patient: Edgar White  11/09/2016 8:58 AM  Physician: Dr. Orson Slick, MD  11/09/2016 8:58 AM  Nursing: Elige Radon, RN  11/09/2016 8:58 AM  RN Care Manager: 11/09/2016 8:58 AM  Social Worker: Glorious Peach, MSW, LCSW-A 11/09/2016 8:58 AM  Recreational Therapist: Drue Flirt, LRT/CTRS 11/09/2016 8:58 AM  Other:  11/09/2016 8:58 AM  Other:  11/09/2016 8:58 AM  Other: 11/09/2016 8:58 AM    Scribe for Treatment Team: Emilie Rutter, [place] 11/09/2016 8:58 AM

## 2016-11-09 NOTE — Progress Notes (Signed)
Pleasant and cooperative.  Denies SI/HI/AVH.  Verbalized concerns about discharge. States that he is going to call his brother and be humble because he needs some help.  Further states that his brother is coming to see him tonight so that they can discuss if he can return to his home upon discharge.  Medication and group compliant.  Support and encouragement offered.  Safety maintained.

## 2016-11-09 NOTE — Progress Notes (Signed)
Recreation Therapy Notes  Date: 03.21.18 Time: 2:00 pm Location: Craft Room  Group Topic: Self-esteem  Goal Area(s) Addresses:  Patient will be able to identify benefit of having a healthy self-esteem. Patient will be able to identify ways to increase self-esteem.  Behavioral Response: Attentive, Interactive  Intervention: Self-Portrait  Activity: Patients were given a blank face worksheet and were instructed to draw their self-portrait of how they were currently feeling. Patients were given construction paper and were instructed to write their name and one positive thing about themselves. Patients passed papers around the room and wrote positive traits about peers. Patients were given a blank face worksheet and were instructed to draw their self-portrait of how they were feeling after they read the positive comments from peers.  Education: LRT educated patients on ways to increase their self-esteem.  Education Outcome: Acknowledges education/In group clarification offered   Clinical Observations/Feedback: Patient drew both self-portraits and wrote positive traits about self and others. Patient contributed to group discussion by stating how his faces were different, how his self-esteem affects him, and how he can increase his self-esteem.  Leonette Monarch, LRT/CTRS 11/09/2016 3:10 PM

## 2016-11-09 NOTE — Progress Notes (Addendum)
2003: Patient in the hallway, anxious, crying after visiting with his  brother "I got a bad news from my brother...he said no and I have to go to the f* shelter". Patient stated that he does not want to go to the shelter and was going to call friends from everywhere to see if they can help him with housing. Patient quested medication for anxiety and received Vistaril, 50 mg  By mouth. Emotional support and encouragements provided. Was encouraged to discuss his situation with case management in AM. Therapeutic milieu promoted and safety precautions reinforced.

## 2016-11-10 LAB — GLUCOSE, CAPILLARY: Glucose-Capillary: 113 mg/dL — ABNORMAL HIGH (ref 65–99)

## 2016-11-10 NOTE — Progress Notes (Signed)
Prescriptions and seven day supply of medications given.  Personal  Belongings returned.  Escorted off unit by this Probation officer to meet Pilar Plate to be transported to [facility].

## 2016-11-10 NOTE — Progress Notes (Deleted)
  [facility] Adult Case Management Discharge Plan :  Will you be returning to the same living situation after discharge:  No. At discharge, do you have transportation home?: Yes,  [facility] bus. Do you have the ability to pay for your medications: No.  Release of information consent forms completed and in the chart;  Patient's signature needed at discharge.  Patient to Follow up at: Follow-up Information    [facility]. Go on 11/11/2016.   Why:  Please follow-up with [facility] walk-in hospital follow-up clinic M-F at 8:30AM. Please bring discharge paperwork to this appointment. If you have questions, contact [place]. Contact information: Address: [address] [place], [place] [address] Phone: [phone] Fax: [phone]          Next level of care provider has access to [place] and Suicide Prevention discussed: Yes,  SPE completed with patient and pt's sister.  Have you used any form of tobacco in the last 30 days? (Cigarettes, Smokeless Tobacco, Cigars, and/or Pipes): Yes  Has patient been referred to the Quitline?: Patient refused referral  Patient has been referred for addiction treatment: Pt. refused referral  Emilie Rutter, MSW, LCSW-A 11/10/2016, 9:30 AM

## 2016-11-10 NOTE — BHH Suicide Risk Assessment (Signed)
[facility] Discharge Suicide Risk Assessment   Principal Problem: Major depressive disorder, recurrent severe without psychotic features [facility]) Discharge Diagnoses:  Patient Active Problem List   Diagnosis Date Noted  . Cannabis use disorder, moderate, dependence ([place]) [F12.20] 11/04/2016  . Tobacco use disorder [F17.200] 11/04/2016  . Severe recurrent major depression without psychotic features ([place]) [F33.2] 11/04/2016  . Substance induced mood disorder ([place]) [F19.94] 10/13/2016  . Diabetes mellitus without complication ([place]) [T24.4] 06/08/2016  . Anxiety state [F41.1]   . Polysubstance abuse [F19.10]   . Major depressive disorder, recurrent severe without psychotic features ([place]) [F33.2] 04/13/2016    Total Time spent with patient: 30 minutes  Musculoskeletal: Strength & Muscle Tone: within normal limits Gait & Station: normal Patient leans: N/A  Psychiatric Specialty Exam: Review of Systems  Psychiatric/Behavioral: The patient is nervous/anxious.   All other systems reviewed and are negative.   Blood pressure (!) 155/82, pulse 70, temperature 97.8 F (36.6 C), resp. rate 18, height 5\' 10"  (1.778 m), weight 75.8 kg (167 lb), SpO2 100 %.Body mass index is 23.96 kg/m.  General Appearance: Casual  Eye Contact::  Good  Speech:  Clear and Coherent409  Volume:  Normal  Mood:  Anxious  Affect:  Appropriate  Thought Process:  Goal Directed and Descriptions of Associations: Intact  Orientation:  Full (Time, Place, and Person)  Thought Content:  WDL  Suicidal Thoughts:  No  Homicidal Thoughts:  No  Memory:  Immediate;   Fair Recent;   Fair Remote;   Fair  Judgement:  Impaired  Insight:  Shallow  Psychomotor Activity:  Normal  Concentration:  Fair  Recall:  [employer] of Knowledge:Fair  Language: Fair  Akathisia:  No  Handed:  Right  AIMS (if indicated):     Assets:  Communication Skills Desire for Improvement Physical Health Resilience  Sleep:  Number of Hours: 6.5   Cognition: WNL  ADL's:  Intact   Mental Status Per Nursing Assessment::   On Admission:  Suicidal ideation indicated by patient  Demographic Factors:  Male, Divorced or widowed, Caucasian, Low socioeconomic status and Unemployed  Loss Factors: Loss of significant relationship, Legal issues and Financial problems/change in socioeconomic status  Historical Factors: Prior suicide attempts, Family history of mental illness or substance abuse and Impulsivity  Risk Reduction Factors:   Sense of responsibility to family  Continued Clinical Symptoms:  Depression:   Comorbid alcohol abuse/dependence Impulsivity Alcohol/Substance Abuse/Dependencies  Cognitive Features That Contribute To Risk:  None    Suicide Risk:  Minimal: No identifiable suicidal ideation.  Patients presenting with no risk factors but with morbid ruminations; may be classified as minimal risk based on the severity of the depressive symptoms  Follow-up Information    [facility]. Go on 11/11/2016.   Why:  Please follow-up with [place] walk-in hospital follow-up clinic M-F at 8:30AM. Please bring discharge paperwork to this appointment. If you have questions, contact [place]. Contact information: Address: [address] [place], [place] [address] Phone: [phone] Fax: [phone]          Plan Of [place] tolerated.ollow-up recommendations:  Activity:  as tolerated. Diet:  low sodium heart healthy ADA diet. Other:  keep follow up appointments.  Orson Slick, MD 11/10/2016, 9:55 AM

## 2016-11-10 NOTE — Discharge Summary (Signed)
Physician Discharge Summary Note  Patient:  Edgar White is an 51 y.o., male MRN:  [id number] DOB:  September 26, 1965 Patient phone:  [phone] (home)  Patient address:   [place] [address],  Total Time spent with patient: 30 minutes  Date of Admission:  11/04/2016 Date of Discharge: 11/10/2016  Reason for Admission:  Suicidal ideation.  Identifying data. Edgar White is a 51 year old male with a history of depression.  Chief complaint. "We did not have a good discharge plan."  History of present illness. Information was obtained from the patient and the chart. Edgar White was hospitalized at [facility] at the end of February. He was discharged with a friend on Effexor. He returned to the Emergency room complainng of severe depression and suicidal ideation. He feels that Effexor has not been helpful. He claims to be compliant with treatment. He had an argument with his "friend" and was asked to leave. He is homeles. He reports many symptoms of depression with poor sleep, decreased appetite, anhedonia, feeling of hoplesness worthlesness and guilt, social isolation, crying spells, heightened anxiety and now suicidal thinking with a plan to overdose. He now reports symptoms of PTSD with nightmares and flashbacks from stress related to his parents loss. He denies psychotic symptoms or symptoms suggestive of bipolar mania. He denies alcohol or illicit substance use since discharge but has a history of alcohol, cocaine and cannabis. He was positive for cannabis on admission.  Past psychiatric history. He had several psychiatric hospitalizations at [facility] in 2012, 2013 and 2014 that include ECT treatment. He had two recent admissions to [facility] in 03/2016 and 09/2016 for depression and substance use. He has two suicide attempts by overdose and hanging in 2014. His major stressor now is conflict with his family about inheritance following his mother's death. He has been tried on multiple  medications including Celexa, Zoloft, Effexor, Pristiq and Zyprexa. He found Pristiq most useful.   Family psychiatric history. Multiple family members with depression and substance abuse.   Social history. The patient has a college degree but has not been able to maintain employment. 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 His father also died during that period of time. After her death his family sold her hose and he was asked to leave. He received approximately $38K and moved to [place]. He became involved in a relationship, and ended up spending all the money in 7 months "partying". He took his family to court over the inheritance which created more conflict. Prior to admission to [facility], he had been staying with his brother. It still seems to be an option.  Principal Problem: Major depressive disorder, recurrent severe without psychotic features [facility]) Discharge Diagnoses: Patient Active Problem List   Diagnosis Date Noted  . Cannabis use disorder, moderate, dependence ([place]) [F12.20] 11/04/2016  . Tobacco use disorder [F17.200] 11/04/2016  . Severe recurrent major depression without psychotic features ([place]) [F33.2] 11/04/2016  . Substance induced mood disorder ([place]) [F19.94] 10/13/2016  . Diabetes mellitus without complication ([place]) [X93.7] 06/08/2016  . Anxiety state [F41.1]   . Polysubstance abuse [F19.10]   . Major depressive disorder, recurrent severe without psychotic features ([place]) [F33.2] 04/13/2016   Past Medical History:  Past Medical History:  Diagnosis Date  . Depression   . Diabetes mellitus without complication [facility])     Past Surgical History:  Procedure Laterality Date  . INGUINAL HERNIA REPAIR    . LUMBAR FUSION  Family History: History reviewed. No pertinent family history.   Social History:  History  Alcohol Use No    Comment: denies use     History  Drug Use  . Types: Marijuana, Cocaine    Comment: Last used in  february 2018    Social History   Social History  . Marital status: Single    Spouse name: N/A  . Number of children: N/A  . Years of education: N/A   Social History Main Topics  . Smoking status: Current Every Day Smoker    Packs/day: 0.50    Types: Cigarettes  . Smokeless tobacco: Never Used  . Alcohol use No     Comment: denies use  . Drug use: Yes    Types: Marijuana, Cocaine     Comment: Last used in february 2018  . Sexual activity: No   Other Topics Concern  . None   Social History Narrative  . None    Hospital Course:    Edgar White is a 51 year old male with a history of depression, anxiety, mood instability, and substance abuse admitted for worsening of depression and suicidal ideation with a plan to overdose in the context of severe social stressors.  1. Suicidal ideation. Resolved. The patient is able to contract for safety. He is forward thinking and more optimistic about the future.  2. Mood. He was started on Pristiq that was helpful in the past.  3. Diabetes. He is on metformin and ADA. His sugars were elevated and Metformin was increased to 1000 mg twice daily. He received Novolog according to sliding scale in the hospital. Hemoglobin A1c is elevated at 8.3   4. Anxiety. Vistaril was available.   5. Smoking. Nicotine patch was available.  6. Insomnia. Trazodone was available.  7. Tooth infection. He is on v-cillin for 39 doses.  8. Metabolic syndrome monitoring. Lipid panel shows elevated cholesterol and triglycerides. We started Lopid.  9. Disposition. He was discharged to the local homeless shelter. He will follow up with [facility].   Physical Findings: AIMS: Facial and Oral Movements Muscles of Facial Expression: None, normal Lips and Perioral Area: None, normal Jaw: None, normal Tongue: None, normal,Extremity Movements Upper (arms, wrists, hands, fingers): None, normal Lower (legs, knees, ankles, toes): None, normal, Trunk  Movements Neck, shoulders, hips: None, normal, Overall Severity Severity of abnormal movements (highest score from questions above): None, normal Incapacitation due to abnormal movements: None, normal Patient's awareness of abnormal movements (rate only patient's report): No Awareness, Dental Status Current problems with teeth and/or dentures?: Yes (R upper molar loose, painful) Does patient usually wear dentures?: No  CIWA:    COWS:     Musculoskeletal: Strength & Muscle Tone: within normal limits Gait & Station: normal Patient leans: N/A  Psychiatric Specialty Exam: Physical Exam  Nursing note and vitals reviewed. Psychiatric: His speech is normal and behavior is normal. Judgment and thought content normal. His mood appears anxious. Cognition and memory are normal.    Review of Systems  Psychiatric/Behavioral: The patient is nervous/anxious.   All other systems reviewed and are negative.   Blood pressure (!) 155/82, pulse 70, temperature 97.8 F (36.6 C), resp. rate 18, height 5\' 10"  (1.778 m), weight 75.8 kg (167 lb), SpO2 100 %.Body mass index is 23.96 kg/m.  General Appearance: Casual  Eye Contact:  Good  Speech:  Clear and Coherent  Volume:  Normal  Mood:  Anxious  Affect:  Appropriate  Thought Process:  Goal Directed and Descriptions of Associations:  Intact  Orientation:  Full (Time, Place, and Person)  Thought Content:  WDL  Suicidal Thoughts:  No  Homicidal Thoughts:  No  Memory:  Immediate;   Fair Recent;   Fair Remote;   Fair  Judgement:  Impaired  Insight:  Shallow  Psychomotor Activity:  Normal  Concentration:  Concentration: Fair and Attention Span: Fair  Recall:  [employer] of Knowledge:  Fair  Language:  Fair  Akathisia:  No  Handed:  Right  AIMS (if indicated):     Assets:  Communication Skills Desire for Improvement Physical Health Resilience  ADL's:  Intact  Cognition:  WNL  Sleep:  Number of Hours: 6.5     Have you used any form of  tobacco in the last 30 days? (Cigarettes, Smokeless Tobacco, Cigars, and/or Pipes): Yes  Has this patient used any form of tobacco in the last 30 days? (Cigarettes, Smokeless Tobacco, Cigars, and/or Pipes) Yes, Yes, A prescription for an FDA-approved tobacco cessation medication was offered at discharge and the patient refused  Blood Alcohol level:  Lab Results  Component Value Date   [facility] <5 11/03/2016   ETH <5 63/89/3734    Metabolic Disorder Labs:  Lab Results  Component Value Date   HGBA1C 8.3 (H) 10/13/2016   MPG 192 10/13/2016   MPG 140 04/15/2016   No results found for: PROLACTIN Lab Results  Component Value Date   CHOL 210 (H) 11/05/2016   TRIG 170 (H) 11/05/2016   HDL 23 (L) 11/05/2016   CHOLHDL 9.1 11/05/2016   VLDL 34 11/05/2016   LDLCALC 153 (H) 11/05/2016   LDLCALC 138 (H) 04/15/2016    See Psychiatric Specialty Exam and Suicide Risk Assessment completed by Attending Physician prior to discharge.  Discharge destination:  Other:  homeless shelter.  Is patient on multiple antipsychotic therapies at discharge:  No   Has Patient had three or more failed trials of antipsychotic monotherapy by history:  No  Recommended Plan for Multiple Antipsychotic Therapies: NA  Discharge Instructions    Diet - low sodium heart healthy    Complete by:  As directed    Increase activity slowly    Complete by:  As directed      Allergies as of 11/10/2016   No Known Allergies     Medication List    STOP taking these medications   amoxicillin 500 MG capsule Commonly known as:  AMOXIL   nicotine 21 mg/24hr patch Commonly known as:  NICODERM CQ - dosed in mg/24 hours   venlafaxine XR 150 MG 24 hr capsule Commonly known as:  EFFEXOR-XR     TAKE these medications     Indication  benzocaine 10 % mucosal gel Commonly known as:  ORAJEL Use as directed in the mouth or throat 3 (three) times daily as needed for mouth pain.  Indication:  Tooth pain   desvenlafaxine 50 MG  24 hr tablet Commonly known as:  PRISTIQ Take 1 tablet (50 mg total) by mouth daily.  Indication:  Major Depressive Disorder   gemfibrozil 600 MG tablet Commonly known as:  LOPID Take 1 tablet (600 mg total) by mouth 2 (two) times daily before a meal.  Indication:  Increased Fats, Triglycerides & Cholesterol in the Blood   hydrOXYzine 50 MG tablet Commonly known as:  ATARAX/VISTARIL Take 1 tablet (50 mg total) by mouth 3 (three) times daily as needed for anxiety. What changed:  medication strength  how much to take  when to take this  Indication:  Anxiety Neurosis   ibuprofen 800 MG tablet Commonly known as:  ADVIL,MOTRIN Take 1 tablet (800 mg total) by mouth every 6 (six) hours as needed for moderate pain.  Indication:  Inflammation   metFORMIN 1000 MG tablet Commonly known as:  GLUCOPHAGE Take 1 tablet (1,000 mg total) by mouth 2 (two) times daily with a meal. What changed:  medication strength  how much to take  additional instructions  Indication:  Type 2 Diabetes   penicillin v potassium 500 MG tablet Commonly known as:  VEETID Take 1 tablet (500 mg total) by mouth every 6 (six) hours.  Indication:  Sinus Irritation and Congestion   traZODone 100 MG tablet Commonly known as:  DESYREL Take 1 tablet (100 mg total) by mouth at bedtime. What changed:  medication strength  how much to take  when to take this  reasons to take this  Indication:  [place]. Go on 11/11/2016.   Why:  Please follow-up with [facility] on March 23rd at 12:30 PM for your assessment and medication management appointment. Please bring discharge paperwork with you.   Contact information: [place] [address] [phone]           Follow-up recommendations:  Activity:  as tolerated. Diet:  low sodium heart healthy ADA diet. Other:  keep follow up appointments.  Comments:     Signed: Orson Slick, MD 11/10/2016, 11:21 AM

## 2016-11-10 NOTE — Progress Notes (Signed)
Recreation Therapy Notes  INPATIENT RECREATION TR PLAN  Patient Details Name: JOZEF EISENBEIS MRN: [id number] DOB: [birth year] Today's Date: 11/10/2016  Rec Therapy Plan Is patient appropriate for Therapeutic Recreation?: Yes Treatment times per week: At least once a week TR Treatment/Interventions: 1:1 session, Group participation (Comment) (Appropriate participation in daily recreational therapy tx)  Discharge Criteria Pt will be discharged from therapy if:: Treatment goals are met, Discharged Treatment plan/goals/alternatives discussed and agreed upon by:: Patient/family  Discharge Summary Short term goals set: See Care Plan Short term goals met: Complete Progress toward goals comments: One-to-one attended Which groups?: Wellness, Self-esteem, Other (Comment) (Self-expression) One-to-one attended: Self-esteem, coping skills Reason goals not met: N/A Therapeutic equipment acquired: None Reason patient discharged from therapy: Discharge from hospital Pt/family agrees with progress & goals achieved: Yes Date patient discharged from therapy: 11/10/16   Leonette Monarch, LRT/CTRS 11/10/2016, 2:47 PM

## 2016-11-10 NOTE — Progress Notes (Signed)
4827: Patient has been sleeping. Continues to express fear related to housing. Stated he has contact his ex-boyfriend who said he will visit today.  Staff continue to provide support and encouragements.

## 2016-11-10 NOTE — Progress Notes (Signed)
Denies SI/HI/AVH.  Discharge instructions given, verbalized understanding.

## 2016-11-10 NOTE — Plan of Care (Signed)
Problem: Coping: Goal: Ability to verbalize feelings will improve Outcome: Progressing Has been discussing his housing situation, this being his main stressor

## 2016-11-10 NOTE — BHH Group Notes (Signed)
[place] LCSW Group Therapy Note  Date/Time: 11/10/16, 0930  Type of Therapy/Topic:  Group Therapy:  Balance in Life  Participation Level:  PT did not attend group.  Description of Group:    This group will address the concept of balance and how it feels and looks when one is unbalanced. Patients will be encouraged to process areas in their lives that are out of balance, and identify reasons for remaining unbalanced. Facilitators will guide patients utilizing problem- solving interventions to address and correct the stressor making their life unbalanced. Understanding and applying boundaries will be explored and addressed for obtaining  and maintaining a balanced life. Patients will be encouraged to explore ways to assertively make their unbalanced needs known to significant others in their lives, using other group members and facilitator for support and feedback.  Therapeutic Goals: 1. Patient will identify two or more emotions or situations they have that consume much of in their lives. 2. Patient will identify signs/triggers that life has become out of balance:  3. Patient will identify two ways to set boundaries in order to achieve balance in their lives:  4. Patient will demonstrate ability to communicate their needs through discussion and/or role plays  Summary of Patient Progress:          Therapeutic Modalities:   Cognitive Behavioral Therapy Solution-Focused Therapy Assertiveness Training  Lurline Idol, LCSW

## 2016-11-10 NOTE — Plan of Care (Signed)
Problem: Education: Goal: Ability to state activities that reduce stress will improve Outcome: Progressing Was encouraged to verbalize his feelings and to participate in group activities

## 2016-11-10 NOTE — Plan of Care (Signed)
Problem: Safety: Goal: Ability to remain free from injury will improve Outcome: Progressing Remains free from falls. Safety precautions maintained

## 2016-11-10 NOTE — Progress Notes (Signed)
  [facility] Adult Case Management Discharge Plan :  Will you be returning to the same living situation after discharge:  No. At discharge, do you have transportation home?: Yes,  [facility] will [facility] pt up. Do you have the ability to pay for your medications: Yes,  medication management assistance.  Release of information consent forms completed and in the chart;  Patient's signature needed at discharge.  Patient to Follow up at: [facility]. Go on 11/11/2016.   Why:  Please follow-up with [facility] on March 23rd at 12:30 PM for your assessment and medication management appointment. Please bring discharge paperwork with you.   Contact information: [place] [address] [phone]           Next level of care provider has access to [place] and Suicide Prevention discussed: Yes,  SPE completed with pt and pt's sister.  Have you used any form of tobacco in the last 30 days? (Cigarettes, Smokeless Tobacco, Cigars, and/or Pipes): Yes  Has patient been referred to the Quitline?: Patient refused referral  Patient has been referred for addiction treatment: Yes  Emilie Rutter, MSW, LCSW-A 11/10/2016, 10:18 AM

## 2016-12-27 ENCOUNTER — Encounter (INDEPENDENT_AMBULATORY_CARE_PROVIDER_SITE_OTHER): Payer: Self-pay

## 2016-12-27 ENCOUNTER — Ambulatory Visit: Payer: Federal, State, Local not specified - Other | Admitting: Pharmacy Technician

## 2016-12-27 DIAGNOSIS — Z79899 Other long term (current) drug therapy: Secondary | ICD-10-CM

## 2016-12-27 NOTE — Progress Notes (Signed)
Met with patient completed financial assistance application for [place] due to recent ED visit.  Patient agreed to be responsible for gathering financial information and forwarding to appropriate department in [facility].    Completed [facility] application and contract.  Patient agreed to all terms of the [facility]ication Management Clinic contract.   Patient approved to receive medication assistance through 2018 at [facility], as long as eligibility continues to be met.   Referred patient to [facility] and [facility].  Patient indicated that he wasn't interested in going to either clinic at this time.  Patient is seeing Dr. Clemmie Krill, and wants to continue to use Dr. Clemmie Krill as his provider.   Patient stated that he has an appointment with Dr. Karalee Height at [facility] on January 30, 2017.  We will order PAP medications once patient sees Dr. Jacqualine Code.  Provided patient with Civil engineer, contracting based on his particular needs.    [place] [facility]

## 2017-10-30 ENCOUNTER — Telehealth: Payer: Self-pay | Admitting: Pharmacy Technician

## 2017-10-30 NOTE — Telephone Encounter (Signed)
Patient failed to provide 2019 financial documentation.  No additional medication assistance will be provided by [facility] without the required proof of income documentation.  Patient notified by letter.  Donicia Druck J. Harryette Shuart Care Manager [facility]ication Management Clinic 

## 2018-02-02 ENCOUNTER — Other Ambulatory Visit: Payer: Self-pay

## 2018-02-02 ENCOUNTER — Inpatient Hospital Stay
Admission: AD | Admit: 2018-02-02 | Discharge: 2018-02-07 | DRG: 885 | Disposition: A | Discharge status: Home / Self Care | Payer: No Typology Code available for payment source | Attending: Psychiatry | Admitting: Psychiatry

## 2018-02-02 ENCOUNTER — Emergency Department
Admission: EM | Admit: 2018-02-02 | Discharge: 2018-02-02 | Disposition: A | Discharge status: Other Acute Inpatient Hospital | Payer: Self-pay | Attending: Student in an Organized Health Care Education/Training Program | Admitting: Student in an Organized Health Care Education/Training Program

## 2018-02-02 ENCOUNTER — Encounter: Payer: Self-pay | Admitting: Emergency Medicine

## 2018-02-02 DIAGNOSIS — E785 Hyperlipidemia, unspecified: Secondary | ICD-10-CM | POA: Diagnosis present

## 2018-02-02 DIAGNOSIS — F411 Generalized anxiety disorder: Secondary | ICD-10-CM | POA: Diagnosis present

## 2018-02-02 DIAGNOSIS — Z79899 Other long term (current) drug therapy: Secondary | ICD-10-CM

## 2018-02-02 DIAGNOSIS — R45851 Suicidal ideations: Secondary | ICD-10-CM | POA: Insufficient documentation

## 2018-02-02 DIAGNOSIS — Z818 Family history of other mental and behavioral disorders: Secondary | ICD-10-CM

## 2018-02-02 DIAGNOSIS — E119 Type 2 diabetes mellitus without complications: Secondary | ICD-10-CM | POA: Insufficient documentation

## 2018-02-02 DIAGNOSIS — F17200 Nicotine dependence, unspecified, uncomplicated: Secondary | ICD-10-CM | POA: Diagnosis present

## 2018-02-02 DIAGNOSIS — Z7984 Long term (current) use of oral hypoglycemic drugs: Secondary | ICD-10-CM

## 2018-02-02 DIAGNOSIS — F332 Major depressive disorder, recurrent severe without psychotic features: Secondary | ICD-10-CM | POA: Diagnosis present

## 2018-02-02 DIAGNOSIS — Z9119 Patient's noncompliance with other medical treatment and regimen: Secondary | ICD-10-CM

## 2018-02-02 DIAGNOSIS — F17210 Nicotine dependence, cigarettes, uncomplicated: Secondary | ICD-10-CM | POA: Insufficient documentation

## 2018-02-02 DIAGNOSIS — R440 Auditory hallucinations: Secondary | ICD-10-CM | POA: Insufficient documentation

## 2018-02-02 DIAGNOSIS — F1220 Cannabis dependence, uncomplicated: Secondary | ICD-10-CM | POA: Diagnosis present

## 2018-02-02 DIAGNOSIS — F22 Delusional disorders: Secondary | ICD-10-CM | POA: Diagnosis present

## 2018-02-02 DIAGNOSIS — F1210 Cannabis abuse, uncomplicated: Secondary | ICD-10-CM | POA: Diagnosis present

## 2018-02-02 DIAGNOSIS — Z008 Encounter for other general examination: Secondary | ICD-10-CM | POA: Insufficient documentation

## 2018-02-02 LAB — URINE DRUG SCREEN, QUALITATIVE (ARMC ONLY)
Amphetamines, Ur Screen: NOT DETECTED
Benzodiazepine, Ur Scrn: NOT DETECTED
COCAINE METABOLITE, UR ~~LOC~~: NOT DETECTED
Cannabinoid 50 Ng, Ur ~~LOC~~: POSITIVE — AB
MDMA (ECSTASY) UR SCREEN: NOT DETECTED
Methadone Scn, Ur: NOT DETECTED
Opiate, Ur Screen: NOT DETECTED
Phencyclidine (PCP) Ur S: NOT DETECTED
TRICYCLIC, UR SCREEN: NOT DETECTED

## 2018-02-02 LAB — ACETAMINOPHEN LEVEL: Acetaminophen (Tylenol), Serum: <10 ug/mL — ABNORMAL LOW (ref 10–30)

## 2018-02-02 LAB — COMPREHENSIVE METABOLIC PANEL
ALBUMIN: 4.2 g/dL (ref 3.5–5.0)
ALT: 14 U/L — AB (ref 17–63)
AST: 32 U/L (ref 15–41)
Alkaline Phosphatase: 65 U/L (ref 38–126)
Anion gap: 10 (ref 5–15)
BUN: 16 mg/dL (ref 6–20)
CHLORIDE: 102 mmol/L (ref 101–111)
CO2: 23 mmol/L (ref 22–32)
CREATININE: 0.99 mg/dL (ref 0.61–1.24)
Calcium: 9.3 mg/dL (ref 8.9–10.3)
GFR calc non Af Amer: >60 mL/min (ref >60)
GLUCOSE: 227 mg/dL — AB (ref 65–99)
Potassium: 4.1 mmol/L (ref 3.5–5.1)
SODIUM: 135 mmol/L (ref 135–145)
Total Bilirubin: 0.8 mg/dL (ref 0.3–1.2)
Total Protein: 7.1 g/dL (ref 6.5–8.1)

## 2018-02-02 LAB — CBC
HCT: 42.6 % (ref 40.0–52.0)
HEMOGLOBIN: 14.6 g/dL (ref 13.0–18.0)
MCH: 31.6 pg (ref 26.0–34.0)
MCHC: 34.2 g/dL (ref 32.0–36.0)
MCV: 92.5 fL (ref 80.0–100.0)
Platelets: 232 10*3/uL (ref 150–440)
RBC: 4.6 MIL/uL (ref 4.40–5.90)
RDW: 13 % (ref 11.5–14.5)
WBC: 8.8 10*3/uL (ref 3.8–10.6)

## 2018-02-02 LAB — SALICYLATE LEVEL

## 2018-02-02 LAB — GLUCOSE, CAPILLARY: GLUCOSE-CAPILLARY: 147 mg/dL — AB (ref 65–99)

## 2018-02-02 LAB — ETHANOL: Alcohol, Ethyl (B): <10 mg/dL (ref <10)

## 2018-02-02 MED ORDER — TRAZODONE HCL 100 MG PO TABS
100.0000 mg | ORAL_TABLET | Freq: Every day | ORAL | Status: DC
  Administered 2018-02-03 – 2018-02-04 (×2): 100 mg via ORAL
  Filled 2018-02-02 (×2): qty 1

## 2018-02-02 MED ORDER — GEMFIBROZIL 600 MG PO TABS
600.0000 mg | ORAL_TABLET | Freq: Two times a day (BID) | ORAL | Status: DC
  Administered 2018-02-03 (×2): 600 mg via ORAL
  Filled 2018-02-02 (×6): qty 1

## 2018-02-02 MED ORDER — ACETAMINOPHEN 325 MG PO TABS: 650.0000 mg | ORAL_TABLET | Freq: Four times a day (QID) | ORAL | Status: DC | PRN

## 2018-02-02 MED ORDER — INSULIN ASPART 100 UNIT/ML ~~LOC~~ SOLN: 0.0000 [IU] | Freq: Three times a day (TID) | SUBCUTANEOUS | Status: DC

## 2018-02-02 MED ORDER — INSULIN ASPART 100 UNIT/ML ~~LOC~~ SOLN
0.0000 [IU] | Freq: Three times a day (TID) | SUBCUTANEOUS | Status: DC
  Administered 2018-02-03: 2 [IU] via SUBCUTANEOUS
  Administered 2018-02-03 – 2018-02-04 (×2): 1 [IU] via SUBCUTANEOUS
  Administered 2018-02-04: 2 [IU] via SUBCUTANEOUS
  Administered 2018-02-05: 1 [IU] via SUBCUTANEOUS
  Filled 2018-02-02 (×6): qty 1

## 2018-02-02 MED ORDER — METFORMIN HCL 500 MG PO TABS
1000.0000 mg | ORAL_TABLET | Freq: Two times a day (BID) | ORAL | Status: DC
  Administered 2018-02-03 – 2018-02-04 (×3): 1000 mg via ORAL
  Filled 2018-02-02 (×3): qty 2

## 2018-02-02 MED ORDER — TRAZODONE HCL 100 MG PO TABS
100.0000 mg | ORAL_TABLET | Freq: Every day | ORAL | Status: DC
  Administered 2018-02-02: 100 mg via ORAL
  Filled 2018-02-02: qty 1

## 2018-02-02 MED ORDER — VENLAFAXINE HCL 37.5 MG PO TABS
75.0000 mg | ORAL_TABLET | Freq: Two times a day (BID) | ORAL | Status: DC
  Filled 2018-02-02: qty 2

## 2018-02-02 MED ORDER — METFORMIN HCL 500 MG PO TABS: 1000.0000 mg | ORAL_TABLET | Freq: Two times a day (BID) | ORAL | Status: DC

## 2018-02-02 MED ORDER — ALUM & MAG HYDROXIDE-SIMETH 200-200-20 MG/5ML PO SUSP: 30.0000 mL | ORAL | Status: DC | PRN

## 2018-02-02 MED ORDER — GEMFIBROZIL 600 MG PO TABS
600.0000 mg | ORAL_TABLET | Freq: Two times a day (BID) | ORAL | Status: DC
  Filled 2018-02-02: qty 1

## 2018-02-02 MED ORDER — VENLAFAXINE HCL 37.5 MG PO TABS
75.0000 mg | ORAL_TABLET | Freq: Two times a day (BID) | ORAL | Status: DC
  Administered 2018-02-03: 75 mg via ORAL
  Filled 2018-02-02 (×2): qty 2

## 2018-02-02 MED ORDER — HYDROXYZINE HCL 50 MG PO TABS: 50.0000 mg | ORAL_TABLET | Freq: Three times a day (TID) | ORAL | Status: DC | PRN

## 2018-02-02 MED ORDER — MAGNESIUM HYDROXIDE 400 MG/5ML PO SUSP: 30.0000 mL | Freq: Every day | ORAL | Status: DC | PRN

## 2018-02-02 NOTE — BH Assessment (Signed)
Assessment Note  Edgar White is an 52 y.o. male. Patient presents to [facility] voluntarily due to depression characterized by suicidal ideation. Patient endorses SI without a plan. Patient reports one previous suicide attempt. Patient states his depression has began last year and has gotten increasingly worse. In addition, patient reports his depression is triggered by job loss, financial stress, and a bone deficiency which is causing him to lose his teeth. Patient reports he has a loss of interest, feelings of hopelessness, and lies in bed for days at a time. Patient denies HI and AVH. Patient endorses occasional THC use. Patient endorses previous inpatient psychiatric hospitalizations at [facility] and [facility] due to depression.   Patient doesn't currently have any involvement in the legal system.  Patient denies having in outpatient mental health providers.  Patient presented oriented x 4, cooperative with a depressed affect during assessment.   Diagnosis: Major Depressive Disorder, Recurrent, Severe  Past Medical History:  Past Medical History:  Diagnosis Date  . Depression   . Diabetes mellitus without complication [facility]     Past Surgical History:  Procedure Laterality Date  . INGUINAL HERNIA REPAIR    . LUMBAR FUSION      Family History: No family history on file.  Social History:  reports that he has been smoking cigarettes.  He has been smoking about 0.50 packs per day. He has never used smokeless tobacco. He reports that he has current or past drug history. Drugs: Marijuana and Cocaine. He reports that he does not drink alcohol.  Additional Social History:  Alcohol / Drug Use Pain Medications: SEE PTA  Prescriptions: SEE PTA  Over the Counter: SEE PTA  History of alcohol / drug use?: Yes Longest period of sobriety (when/how long): None reported  Negative Consequences of Use: Financial Substance #1 Name of Substance 1: THC  1 - Age of First Use: Unknown 1 - Amount (size/oz):  Unknown 1 - Frequency: Unknown 1 - Duration: Unknown 1 - Last Use / Amount: Unknown  CIWA: CIWA-Ar BP: 115/68 Pulse Rate: 90 COWS:    Allergies: No Known Allergies  Home Medications:  (Not in a hospital admission)  OB/GYN Status:  No LMP for male patient.  General Assessment Data Assessment unable to be completed: (Assessment Completed) Location of Assessment: [facility] ED TTS Assessment: In system Is this a Tele or Face-to-Face Assessment?: Face-to-Face Is this an Initial Assessment or a Re-assessment for this encounter?: Initial Assessment Marital status: Single Maiden name: N/A Is patient pregnant?: No Pregnancy Status: No Living Arrangements: Other relatives(Brother) Can pt return to current living arrangement?: Yes Admission Status: Voluntary Is patient capable of signing voluntary admission?: Yes Referral Source: Self/Family/Friend 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 : Yes Suicidal Intent: Yes-Currently Present Has patient had any suicidal intent within the past 6 months prior to admission? : Yes Is patient at risk for suicide?: Yes Suicidal Plan?: No-Not Currently/Within Last 6 Months Has patient had any suicidal plan within the past 6 months prior to admission? : No Specify Current Suicidal Plan:  None reported Access to Means: No What has been your use of drugs/alcohol within the last 12 months?: THC  Previous Attempts/Gestures: Yes How many  times?: 1 Other Self Harm Risks: None reported Triggers for Past Attempts: Unpredictable Intentional Self Injurious Behavior: None Family Suicide History: No Recent stressful life event(s): Job Loss, Financial Problems, Recent negative physical changes Persecutory voices/beliefs?: No Depression: Yes Depression Symptoms: Feeling worthless/self pity, Loss of interest in usual pleasures, Fatigue, Insomnia Substance abuse history and/or treatment for substance abuse?: No Suicide prevention information given to non-admitted patients: Not applicable  Risk to Others within the past 6 months Homicidal Ideation: No Does patient have any lifetime risk of violence toward others beyond the six months prior to admission? : No Thoughts of Harm to Others: No Current Homicidal Intent: No Current Homicidal Plan: No Access to Homicidal Means: No Identified Victim: None reported History of harm to others?: No Assessment of Violence: None Noted Violent Behavior Description: None reported Does patient have access to weapons?: No Criminal Charges Pending?: No Does patient have a court date: No Is patient on probation?: No  Psychosis Hallucinations: None noted Delusions: None noted  Mental Status Report Appearance/Hygiene: In scrubs Eye Contact: Fair Motor Activity: Unremarkable Speech: Unremarkable Level of Consciousness: Alert Mood: Depressed Affect: Depressed Anxiety Level: None Thought Processes: Circumstantial Judgement: Impaired Orientation: Person, Place, Time, Situation, Appropriate for developmental age Obsessive Compulsive Thoughts/Behaviors: None  Cognitive Functioning Concentration: Normal Memory: Recent Intact, Remote Intact Is patient IDD: No Is patient DD?: No Insight: Good Impulse Control: Fair Appetite: Poor Have you had any weight changes? : Loss Amount of the weight change? (lbs): 35 lbs Sleep: Decreased Total Hours of Sleep: 1 Vegetative Symptoms: Staying in  bed, Decreased grooming, Not bathing  ADLScreening [facility] Assessment Services) Patient's cognitive ability adequate to safely complete daily activities?: Yes Patient able to express need for assistance with ADLs?: Yes Independently performs ADLs?: Yes (appropriate for developmental age)  Prior Inpatient Therapy Prior Inpatient Therapy: Yes Prior Therapy Dates: 2018, 2017, 2013, 2012 Prior Therapy Facilty/Provider(s): [facility]  Reason for Treatment: Depression  Prior Outpatient Therapy Prior Outpatient Therapy: No Does patient have an ACCT team?: No Does patient have Intensive In-House Services?  : No Does patient have [facility] services? : No Does patient have P4CC services?: No  ADL Screening (condition at time of admission) Patient's cognitive ability adequate to safely complete daily activities?: Yes Is the patient deaf or have difficulty hearing?: No Does the patient have difficulty seeing, even when wearing glasses/contacts?: No Does the patient have difficulty concentrating, remembering, or making decisions?: No Patient able to express need for assistance with ADLs?: Yes Does the patient have difficulty dressing or bathing?: No Independently performs ADLs?: Yes (appropriate for developmental age) Communication: Independent Dressing (OT): Independent Grooming: Independent Feeding: Independent Bathing: Independent Toileting: Independent In/Out Bed: Independent Walks in Home: Independent Does the patient have difficulty walking or climbing stairs?: No Weakness of Legs: None Weakness of Arms/Hands: None  Home Assistive Devices/Equipment Home Assistive Devices/Equipment: None  Therapy Consults (therapy consults require a physician order) PT Evaluation Needed: No OT Evalulation Needed: No SLP Evaluation Needed: No Abuse/Neglect Assessment (Assessment to be complete while patient is alone) Abuse/Neglect Assessment Can Be Completed: Yes Physical Abuse: Denies Verbal  Abuse: Denies Sexual Abuse: Denies Exploitation of patient/patient's resources: Denies Self-Neglect: Denies Values / Beliefs Cultural Requests During Hospitalization: None Spiritual Requests During Hospitalization: None Consults Spiritual Care Consult Needed: No Social Work Consult Needed: No  Disposition:  Disposition Initial Assessment Completed for this Encounter: Yes Patient referred to: Other (Comment)(pending psych consult )  On Site Evaluation by:   Reviewed with Physician:    Jodie Echevaria, [place], LCAS-A 02/02/2018 6:03 PM

## 2018-02-02 NOTE — BH Assessment (Addendum)
Patient is to be admitted to [facility] by Dr. Weber Cooks.  Attending Physician will be Dr. Bary Leriche.   Patient has been assigned to room 311-A, by [place].   Intake Paper Work has been signed and placed on patient chart.  ER staff is aware of the admission:  [facility] ER [facility]   Dr. Quentin Cornwall, ER MD   Joycelyn Schmid Patient's Nurse   Genella Rife Patient Access.

## 2018-02-02 NOTE — ED Notes (Addendum)
Pt calm, pleasant. Pt states he has been feeling depressed for about 6 months.  Pt reported he lost his job 6 months ago and has been dealing with financial problems. Passive SI, no plan.  Pt denies HI and AVH. Denies pain. Maintained on 15 minute checks and observation by security camera for safety.

## 2018-02-02 NOTE — Progress Notes (Signed)
Pt admitted from [facility] for increased depression. On approach he is cooperative with treatment and pleasant when engaged in conversation. He denies suicidal ideation, denies hallucinations and he denies homicidal ideation at [place] s time. He was checked upon admission to unit for contraband by two RN's no contraband found. He reports that stressors are he is not able to find a job and has no money. He also report his front teeth and teeth on the side of his mouth are falling out. He completed admission process and is now resting in bed quietly.

## 2018-02-02 NOTE — Consult Note (Signed)
[place] [facility] Consult    Reason for Consult: Consult for 52 year old man with a history of depression and diabetes who comes to the hospital with major depression Referring Physician: Quentin Cornwall Patient Identification: Edgar White MRN:  [id number] Principal Diagnosis: Major depressive disorder, recurrent severe without psychotic features [facility]) Diagnosis:   Patient Active Problem List   Diagnosis Date Noted  . Cannabis use disorder, moderate, dependence ([place]) [F12.20] 11/04/2016  . Tobacco use disorder [F17.200] 11/04/2016  . Severe recurrent major depression without psychotic features ([place]) [F33.2] 11/04/2016  . Substance induced mood disorder ([place]) [F19.94] 10/13/2016  . Diabetes mellitus without complication ([place]) [F81.8] 06/08/2016  . Anxiety state [F41.1]   . Polysubstance abuse ([place]) [F19.10]   . Major depressive disorder, recurrent severe without psychotic features ([place]) [F33.2] 04/13/2016    Total Time spent with patient: 1 hour  Subjective:   Edgar White is a 52 y.o. male patient admitted with "I am really depressed".  HPI: Patient seen chart reviewed.  Patient known from previous visits.  52 year old man with a history of long-standing depression.  He says for the last several months he has been feeling particularly terrible.  His energy level is very low.  He almost never gets out of bed.  He has not been eating well and has lost quite a bit of weight.  Feels nervous a lot.  Sleep pattern is irregular.  He is not taking any of his psychiatric medicine including not taking his diabetes medicine.  He has suicidal thoughts and wishes that he were dead.  Social history: Patient is living with his brother.  Once again the patient has made no effort to apply for disability.  Medical history: Diabetes.  Losing weight.  Substance abuse history: Smokes marijuana occasionally.  Denies alcohol use or other drug use currently.  Past Psychiatric History: Long history of  depression several prior hospitalizations several prior antidepressants.  Several prior serious suicide attempts.  Has had ECT as well as antidepressants.  Does reasonably well when he is on his medicine.  Currently he is going to [place] and is taking a combination of Celexa and Latuda and thinks it is making him feel worse.  Risk to Self: Suicidal Ideation: Yes-Currently Present Suicidal Intent: Yes-Currently Present Is patient at risk for suicide?: Yes Suicidal Plan?: No-Not Currently/Within Last 6 Months Specify Current Suicidal Plan: None reported Access to Means: No What has been your use of drugs/alcohol within the last 12 months?: THC  How many times?: 1 Other Self Harm Risks: None reported Triggers for Past Attempts: Unpredictable Intentional Self Injurious Behavior: None Risk to Others: Homicidal Ideation: No Thoughts of Harm to Others: No Current Homicidal Intent: No Current Homicidal Plan: No Access to Homicidal Means: No Identified Victim: None reported History of harm to others?: No Assessment of Violence: None Noted Violent Behavior Description: None reported Does patient have access to weapons?: No Criminal Charges Pending?: No Does patient have a court date: No Prior Inpatient Therapy: Prior Inpatient Therapy: Yes Prior Therapy Dates: 2018, 2017, 2013, 2012 Prior Therapy Facilty/Provider(s): [facility]  Reason for Treatment: Depression Prior Outpatient Therapy: Prior Outpatient Therapy: No Does patient have an ACCT team?: No Does patient have Intensive In-House Services?  : No Does patient have [facility] services? : No Does patient have P4CC services?: No  Past Medical History:  Past Medical History:  Diagnosis Date  . Depression   . Diabetes mellitus without complication [facility])     Past Surgical History:  Procedure Laterality  Date  . INGUINAL HERNIA REPAIR    . LUMBAR FUSION     Family History: No family history on file. Family Psychiatric  History: Positive  for anxiety Social History:  Social History   Substance and Sexual Activity  Alcohol Use No   Comment: denies use     Social History   Substance and Sexual Activity  Drug Use Yes  . Types: Marijuana, Cocaine   Comment: Last used in february 2018    Social History   Socioeconomic History  . Marital status: Single    Spouse name: Not on file  . Number of children: Not on file  . Years of education: Not on file  . Highest education level: Not on file  Occupational History  . Not on file  Social Needs  . Financial resource strain: Not on file  . Food insecurity:    Worry: Not on file    Inability: Not on file  . Transportation needs:    Medical: Not on file    Non-medical: Not on file  Tobacco Use  . Smoking status: Current Every Day Smoker    Packs/day: 0.50    Types: Cigarettes  . Smokeless tobacco: Never Used  Substance and Sexual Activity  . Alcohol use: No    Comment: denies use  . Drug use: Yes    Types: Marijuana, Cocaine    Comment: Last used in february 2018  . Sexual activity: Never  Lifestyle  . Physical activity:    Days per week: Not on file    Minutes per session: Not on file  . Stress: Not on file  Relationships  . Social connections:    Talks on phone: Not on file    Gets together: Not on file    Attends religious service: Not on file    Active member of club or organization: Not on file    Attends meetings of clubs or organizations: Not on file    Relationship status: Not on file  Other Topics Concern  . Not on file  Social History Narrative  . Not on file   Additional Social History:    Allergies:  No Known Allergies  Labs:  Results for orders placed or performed during the hospital encounter of 02/02/18 (from the past 48 hour(s))  Comprehensive metabolic panel     Status: Abnormal   Collection Time: 02/02/18  4:06 PM  Result Value Ref Range   Sodium 135 135 - 145 mmol/L   Potassium 4.1 3.5 - 5.1 mmol/L   Chloride 102 101 - 111  mmol/L   CO2 23 22 - 32 mmol/L   Glucose, Bld 227 (H) 65 - 99 mg/dL   BUN 16 6 - 20 mg/dL   Creatinine, Ser 0.99 0.61 - 1.24 mg/dL   Calcium 9.3 8.9 - 10.3 mg/dL   Total Protein 7.1 6.5 - 8.1 g/dL   Albumin 4.2 3.5 - 5.0 g/dL   AST 32 15 - 41 U/L   ALT 14 (L) 17 - 63 U/L   Alkaline Phosphatase 65 38 - 126 U/L   Total Bilirubin 0.8 0.3 - 1.2 mg/dL   GFR calc non Af Amer >60 >60 mL/min   GFR calc Af Amer >60 >60 mL/min    Comment: (NOTE) The eGFR has been calculated using the CKD EPI equation. This calculation has not been validated in all clinical situations. eGFR's persistently <60 mL/min signify possible Chronic Kidney Disease.    Anion gap 10 5 - 15  Comment: Performed at [facility], [address] [place], [place] [address]  Ethanol     Status: None   Collection Time: 02/02/18  4:06 PM  Result Value Ref Range   Alcohol, Ethyl (B) <10 <10 mg/dL    Comment: (NOTE) Lowest detectable limit for serum alcohol is 10 mg/dL. For medical purposes only. Performed at [facility], [address] [place], [place] [address]   Salicylate level     Status: None   Collection Time: 02/02/18  4:06 PM  Result Value Ref Range   Salicylate Lvl <5.4 2.8 - 30.0 mg/dL    Comment: Performed at [facility], [address] [place], [place] [address]  Acetaminophen level     Status: Abnormal   Collection Time: 02/02/18  4:06 PM  Result Value Ref Range   Acetaminophen (Tylenol), Serum <10 (L) 10 - 30 ug/mL    Comment: (NOTE) Therapeutic concentrations vary significantly. A range of 10-30 ug/mL  may be an effective concentration for many patients. However, some  are best treated at concentrations outside of this range. Acetaminophen concentrations >150 ug/mL at 4 hours after ingestion  and >50 ug/mL at 12 hours after ingestion are often associated with  toxic reactions. Performed at [facility], [address] [place], [place] [address]   cbc      Status: None   Collection Time: 02/02/18  4:06 PM  Result Value Ref Range   WBC 8.8 3.8 - 10.6 K/uL   RBC 4.60 4.40 - 5.90 MIL/uL   Hemoglobin 14.6 13.0 - 18.0 g/dL   HCT 42.6 40.0 - 52.0 %   MCV 92.5 80.0 - 100.0 fL   MCH 31.6 26.0 - 34.0 pg   MCHC 34.2 32.0 - 36.0 g/dL   RDW 13.0 11.5 - 14.5 %   Platelets 232 150 - 440 K/uL    Comment: Performed at [facility], [address] [place], [place] [address]  Urine Drug Screen, Qualitative     Status: Abnormal   Collection Time: 02/02/18  4:42 PM  Result Value Ref Range   Tricyclic, Ur Screen NONE DETECTED NONE DETECTED   Amphetamines, Ur Screen NONE DETECTED NONE DETECTED   MDMA (Ecstasy)Ur Screen NONE DETECTED NONE DETECTED   Cocaine Metabolite,Ur [place] NONE DETECTED NONE DETECTED   Opiate, Ur Screen NONE DETECTED NONE DETECTED   Phencyclidine (PCP) Ur S NONE DETECTED NONE DETECTED   Cannabinoid 50 Ng, Ur [place] POSITIVE (A) NONE DETECTED   Barbiturates, Ur Screen (A) NONE DETECTED    Result not available. Reagent lot number recalled by manufacturer.   Benzodiazepine, Ur Scrn NONE DETECTED NONE DETECTED   Methadone Scn, Ur NONE DETECTED NONE DETECTED    Comment: (NOTE) Tricyclics + metabolites, urine    Cutoff 1000 ng/mL Amphetamines + metabolites, urine  Cutoff 1000 ng/mL MDMA (Ecstasy), urine              Cutoff 500 ng/mL Cocaine Metabolite, urine          Cutoff 300 ng/mL Opiate + metabolites, urine        Cutoff 300 ng/mL Phencyclidine (PCP), urine         Cutoff 25 ng/mL Cannabinoid, urine                 Cutoff 50 ng/mL Barbiturates + metabolites, urine  Cutoff 200 ng/mL Benzodiazepine, urine              Cutoff 200 ng/mL Methadone, urine  Cutoff 300 ng/mL The urine drug screen provides only a preliminary, unconfirmed analytical test result and should not be used for non-medical purposes. Clinical consideration and professional judgment should be applied to any positive drug screen result due to  possible interfering substances. A more specific alternate chemical method must be used in order to obtain a confirmed analytical result. Gas chromatography / mass spectrometry (GC/MS) is the preferred confirmat ory method. Performed at [facility], [address] [place],  [address]     Current Facility-Administered Medications  Medication Dose Route Frequency Provider Last Rate Last Dose  . [START ON 02/03/2018] gemfibrozil (LOPID) tablet 600 mg  600 mg Oral BID AC Cleaster Shiffer T, MD      . Derrill Memo ON 02/03/2018] insulin aspart (novoLOG) injection 0-9 Units  0-9 Units Subcutaneous TID WC Arletta Lumadue T, MD      . Derrill Memo ON 02/03/2018] metFORMIN (GLUCOPHAGE) tablet 1,000 mg  1,000 mg Oral BID WC Itzel Mckibbin T, MD      . traZODone (DESYREL) tablet 100 mg  100 mg Oral QHS Scarlette Hogston T, MD      . Derrill Memo ON 02/03/2018] venlafaxine [facility]) tablet 75 mg  75 mg Oral BID WC Aamirah Salmi, Madie Reno, MD       Current Outpatient Medications  Medication Sig Dispense Refill  . benzocaine (ORAJEL) 10 % mucosal gel Use as directed in the mouth or throat 3 (three) times daily as needed for mouth pain. 5.3 g 1  . desvenlafaxine (PRISTIQ) 50 MG 24 hr tablet Take 1 tablet (50 mg total) by mouth daily. 30 tablet 1  . gemfibrozil (LOPID) 600 MG tablet Take 1 tablet (600 mg total) by mouth 2 (two) times daily before a meal. 60 tablet 1  . hydrOXYzine (ATARAX/VISTARIL) 50 MG tablet Take 1 tablet (50 mg total) by mouth 3 (three) times daily as needed for anxiety. 90 tablet 1  . ibuprofen (ADVIL,MOTRIN) 800 MG tablet Take 1 tablet (800 mg total) by mouth every 6 (six) hours as needed for moderate pain. 30 tablet 0  . metFORMIN (GLUCOPHAGE) 1000 MG tablet Take 1 tablet (1,000 mg total) by mouth 2 (two) times daily with a meal. 60 tablet 1  . penicillin v potassium (VEETID) 500 MG tablet Take 1 tablet (500 mg total) by mouth every 6 (six) hours. 20 tablet 0  . traZODone (DESYREL) 100 MG tablet Take  1 tablet (100 mg total) by mouth at bedtime. 30 tablet 1    Musculoskeletal: Strength & Muscle Tone: decreased Gait & Station: normal Patient leans: N/A  Psychiatric Specialty Exam: Physical Exam  Nursing note and vitals reviewed. Constitutional: He appears well-developed and well-nourished.  HENT:  Head: Normocephalic and atraumatic.  Eyes: Pupils are equal, round, and reactive to light. Conjunctivae are normal.  Neck: Normal range of motion.  Cardiovascular: Regular rhythm and normal heart sounds.  Respiratory: Effort normal. No respiratory distress.  GI: Soft.  Musculoskeletal: Normal range of motion.  Neurological: He is alert.  Skin: Skin is warm and dry.  Psychiatric: His mood appears anxious. His affect is blunt. His speech is delayed. He is slowed. Thought content is paranoid. Cognition and memory are impaired. He expresses impulsivity. He exhibits a depressed mood. He expresses suicidal ideation. He expresses suicidal plans.    Review of Systems  Constitutional: Negative.   HENT: Negative.   Eyes: Negative.   Respiratory: Negative.   Cardiovascular: Negative.   Gastrointestinal: Negative.   Musculoskeletal: Negative.   Skin: Negative.  Neurological: Negative.   Psychiatric/Behavioral: Positive for depression, memory loss and suicidal ideas. Negative for hallucinations and substance abuse. The patient is nervous/anxious and has insomnia.     Blood pressure 115/68, pulse 90, temperature 98.9 F (37.2 C), temperature source Oral, resp. rate 16, height 5' 10"  (1.778 m), weight 67.1 kg (148 lb), SpO2 96 %.Body mass index is 21.24 kg/m.  General Appearance: Casual  Eye Contact:  Fair  Speech:  Clear and Coherent  Volume:  Decreased  Mood:  Anxious and Depressed  Affect:  Congruent  Thought Process:  Goal Directed  Orientation:  Full (Time, Place, and Person)  Thought Content:  Logical  Suicidal Thoughts:  Yes.  with intent/plan  Homicidal Thoughts:  No  Memory:   Immediate;   Good Recent;   Fair Remote;   Fair  Judgement:  Fair  Insight:  Fair  Psychomotor Activity:  Decreased  Concentration:  Concentration: Fair  Recall:  [employer] of Knowledge:  Fair  Language:  Fair  Akathisia:  No  Handed:  Right  AIMS (if indicated):     Assets:  Desire for Improvement Housing  ADL's:  Impaired  Cognition:  Impaired,  Mild  Sleep:        Treatment Plan Summary: Daily contact with patient to assess and evaluate symptoms and progress in treatment, Medication management and Plan Patient will be admitted to the psychiatric ward.  15-minute checks in place.  Resume medicine for diabetes and also do glycemic checks.  Restart Effexor.  Discontinue Latuda.  Engage patient in individual and group psychotherapy on the unit.  Disposition: Recommend psychiatric Inpatient admission when medically cleared. Supportive therapy provided about ongoing stressors.  Alethia Berthold, MD 02/02/2018 6:47 PM

## 2018-02-02 NOTE — ED Triage Notes (Signed)
"  Today and everyday I'm depressed".  States lately he has been staying in bed for 2-3 days and not eating.  States symptoms have been worsening for the past year, since May 2018.   Reports intermittent SI.  Denies plan.  Denies HI.    AAOx3.  Skin warm and dry.  Calm and cooperative.  NAD

## 2018-02-02 NOTE — ED Provider Notes (Signed)
Edgar White Provider Note    First MD Initiated Contact with Patient 02/02/18 1619     (approximate)  I have reviewed the triage vital signs and the nursing notes.   HISTORY  Chief Complaint Suicidal    HPI Edgar White is a 52 y.o. male the history of depression presents the ER with several weeks of progressively worsening depressive symptoms were he is feeling increased anhedonia as well as desire to die.  States he just when she was diagnosed sleep.  States he does have a plan to overdose on pills.  States he has been feeling more paranoid particular around people and feeling that people are talking about him.  He does not have any HI.  Is having auditory hallucinations but no command hallucinations.  Denies any changes to his medications.    Past Medical History:  Diagnosis Date  . Depression   . Diabetes mellitus without complication ([place])    No family history on file. Past Surgical History:  Procedure Laterality Date  . INGUINAL HERNIA REPAIR    . LUMBAR FUSION     Patient Active Problem List   Diagnosis Date Noted  . Cannabis use disorder, moderate, dependence ([place]) 11/04/2016  . Tobacco use disorder 11/04/2016  . Severe recurrent major depression without psychotic features ([place]) 11/04/2016  . Substance induced mood disorder ([place]) 10/13/2016  . Diabetes mellitus without complication ([employer]) 67/67/2094  . Anxiety state   . Polysubstance abuse ([place])   . Major depressive disorder, recurrent severe without psychotic features ([place]) 04/13/2016      Prior to Admission medications   Medication Sig Start Date End Date Taking? Authorizing Provider  benzocaine (ORAJEL) 10 % mucosal gel Use as directed in the mouth or throat 3 (three) times daily as needed for mouth pain. 11/09/16   Pucilowska, Jolanta B, MD  desvenlafaxine (PRISTIQ) 50 MG 24 hr tablet Take 1 tablet (50 mg total) by mouth daily. 11/10/16   Pucilowska, Herma Ard B, MD   gemfibrozil (LOPID) 600 MG tablet Take 1 tablet (600 mg total) by mouth 2 (two) times daily before a meal. 11/09/16   Pucilowska, Jolanta B, MD  hydrOXYzine (ATARAX/VISTARIL) 50 MG tablet Take 1 tablet (50 mg total) by mouth 3 (three) times daily as needed for anxiety. 11/09/16   Pucilowska, Herma Ard B, MD  ibuprofen (ADVIL,MOTRIN) 800 MG tablet Take 1 tablet (800 mg total) by mouth every 6 (six) hours as needed for moderate pain. 11/09/16   Pucilowska, Herma Ard B, MD  metFORMIN (GLUCOPHAGE) 1000 MG tablet Take 1 tablet (1,000 mg total) by mouth 2 (two) times daily with a meal. 11/09/16   Pucilowska, Jolanta B, MD  penicillin v potassium (VEETID) 500 MG tablet Take 1 tablet (500 mg total) by mouth every 6 (six) hours. 11/09/16   Pucilowska, Herma Ard B, MD  traZODone (DESYREL) 100 MG tablet Take 1 tablet (100 mg total) by mouth at bedtime. 11/09/16   Pucilowska, Wardell Honour, MD    Allergies Patient has no known allergies.    Social History Social History   Tobacco Use  . Smoking status: Current Every Day Smoker    Packs/day: 0.50    Types: Cigarettes  . Smokeless tobacco: Never Used  Substance Use Topics  . Alcohol use: No    Comment: denies use  . Drug use: Yes    Types: Marijuana, Cocaine    Comment: Last used in february 2018    Review of Systems Patient denies headaches, rhinorrhea, blurry  vision, numbness, shortness of breath, chest pain, edema, cough, abdominal pain, nausea, vomiting, diarrhea, dysuria, fevers, rashes or hallucinations unless otherwise stated above in HPI. ____________________________________________   PHYSICAL EXAM:  VITAL SIGNS: Vitals:   02/02/18 1601  BP: 115/68  Pulse: 90  Resp: 16  Temp: 98.9 F (37.2 C)  SpO2: 96%    Constitutional: Alert and oriented.  Eyes: Conjunctivae are normal.  Head: Atraumatic. Nose: No congestion/rhinnorhea. Mouth/Throat: Mucous membranes are moist.   Neck: No stridor. Painless ROM.  Cardiovascular: Normal rate,  regular rhythm. Grossly normal heart sounds.  Good peripheral circulation. Respiratory: Normal respiratory effort.  No retractions. Lungs CTAB. Gastrointestinal: Soft and nontender. No distention. No abdominal bruits. No CVA tenderness. Genitourinary:  Musculoskeletal: No lower extremity tenderness nor edema.  No joint effusions. Neurologic:  Normal speech and language. No gross focal neurologic deficits are appreciated. No facial droop Skin:  Skin is warm, dry and intact. No rash noted. Psychiatric:speech and behavior are normal.  ____________________________________________   LABS (all labs ordered are listed, but only abnormal results are displayed)  No results found for this or any previous visit (from the past 24 hour(s)). ____________________________________________ ____________________________________________   PROCEDURES  Procedure(s) performed:  Procedures    Critical Care performed: no ____________________________________________   INITIAL IMPRESSION / ASSESSMENT AND PLAN / ED COURSE  Pertinent labs & imaging results that were available during my care of the patient were reviewed by me and considered in my medical decision making (see chart for details).   DDX: Psychosis, delirium, medication effect, noncompliance, polysubstance abuse, Si, Hi, depression   BARI White is a 52 y.o. who presents to the ED with for evaluation of depression and si.  Patient has psych history of depression.  Laboratory testing was ordered to evaluation for underlying electrolyte derangement or signs of underlying organic pathology to explain today's presentation.  Based on history and physical and laboratory evaluation, it appears that the patient's presentation is 2/2 underlying psychiatric disorder and will require further evaluation and management by inpatient psychiatry.  Patient was  made an IVC due to SI with plan.  Disposition pending psychiatric evaluation.       As part of my  medical decision making, I reviewed the following data within the [place] notes reviewed and incorporated, Labs reviewed, notes from prior ED visits.   ____________________________________________   FINAL CLINICAL IMPRESSION(S) / ED DIAGNOSES  Final diagnoses:  Suicidal ideation      NEW MEDICATIONS STARTED DURING THIS VISIT:  New Prescriptions   No medications on file     Note:  This document was prepared using Dragon voice recognition software and may include unintentional dictation errors.    Merlyn Lot, MD 02/02/18 [phone]

## 2018-02-02 NOTE — ED Notes (Signed)
Pt belongings placed in pt black bag that came in with pt. Belongings taken from pt consist of shoes, socks, shorts, T-shirt, underwear and belt. Pt black bag consisted of what looked like clothes, but was not searched. Pt put cell phone in black bag and pt had $54.00 (verified by this tech and [place], EDT) in wallet and wallet was placed in black bag at pt request to follow him when he goes down stairs. Pt was given the option of [place] locking up the money or leaving the money in his wallet. Pt chose wallet.

## 2018-02-02 NOTE — ED Notes (Signed)
Pt asked if he wanted blanket, pt told writer No. Pt was giving apple juice.

## 2018-02-02 NOTE — Plan of Care (Signed)
Patient is just being admitted for Depression.

## 2018-02-02 NOTE — Tx Team (Signed)
Initial Treatment Plan 02/02/2018 11:48 PM [name] FMZ:[id number]    PATIENT STRESSORS: Financial difficulties Health problems   PATIENT STRENGTHS: Average or above average intelligence Supportive family/friends   PATIENT IDENTIFIED PROBLEMS: Financial concerns  Depression                   DISCHARGE CRITERIA:  Improved stabilization in mood, thinking, and/or behavior Verbal commitment to aftercare and medication compliance  PRELIMINARY DISCHARGE PLAN: Outpatient therapy Return to previous living arrangement  PATIENT/FAMILY INVOLVEMENT: This treatment plan has been presented to and reviewed with the patient, Edgar White,  .  The patient and family have been given the opportunity to ask questions and make suggestions.  Isabella Bowens, RN 02/02/2018, 11:48 PM

## 2018-02-03 LAB — GLUCOSE, CAPILLARY
GLUCOSE-CAPILLARY: 126 mg/dL — AB (ref 65–99)
GLUCOSE-CAPILLARY: 170 mg/dL — AB (ref 65–99)
Glucose-Capillary: 81 mg/dL (ref 65–99)
Glucose-Capillary: 113 mg/dL — ABNORMAL HIGH (ref 65–99)

## 2018-02-03 LAB — LIPID PANEL
Cholesterol: 235 mg/dL — ABNORMAL HIGH (ref 0–200)
HDL: 35 mg/dL — ABNORMAL LOW (ref >40)
LDL CALC: 171 mg/dL — AB (ref 0–99)
TRIGLYCERIDES: 146 mg/dL (ref <150)
Total CHOL/HDL Ratio: 6.7 RATIO
VLDL: 29 mg/dL (ref 0–40)

## 2018-02-03 LAB — TSH: TSH: 1.029 u[IU]/mL (ref 0.350–4.500)

## 2018-02-03 MED ORDER — SERTRALINE HCL 25 MG PO TABS
50.0000 mg | ORAL_TABLET | Freq: Every day | ORAL | Status: DC
  Administered 2018-02-03 – 2018-02-04 (×2): 50 mg via ORAL
  Filled 2018-02-03 (×2): qty 2

## 2018-02-03 NOTE — [facility] Suicide Risk Assessment (Signed)
[facility] Admission Suicide Risk Assessment   Nursing information obtained from:  Patient Demographic factors:  Male Current Mental Status:  NA Loss Factors:  Financial problems / change in socioeconomic status Historical Factors:  NA Risk Reduction Factors:  Sense of responsibility to family, Religious beliefs about death  Total Time spent with patient: 1 hour Principal Problem: <principal problem not specified> Diagnosis:   Patient Active Problem List   Diagnosis Date Noted  . Cannabis use disorder, moderate, dependence ([place]) [F12.20] 11/04/2016  . Tobacco use disorder [F17.200] 11/04/2016  . Severe recurrent major depression without psychotic features ([place]) [F33.2] 11/04/2016  . Substance induced mood disorder ([place]) [F19.94] 10/13/2016  . Diabetes mellitus without complication ([place]) [M75.4] 06/08/2016  . Anxiety state [F41.1]   . Polysubstance abuse ([place]) [F19.10]   . Major depressive disorder, recurrent severe without psychotic features ([place]) [F33.2] 04/13/2016   Subjective Data:  I'm depressed" 52 year old man with a history of  Depression admitted for worsening depression and suicidal thoughts and wishes that he were dead. Also voiced plan to OD . pt reports depression getting worse for last 6 months and even worse for several weeks. 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 He has not been eating well and has lost  weight. Feels nervous a lot. Sleep pattern is irregular. He is not taking any of his psychiatric medicine including not taking his diabetes medicine. Reports paranoia- "people talking about me", denies AVH. Denies SI at this time,      Continued Clinical Symptoms:    The "Alcohol Use Disorders Identification Test", Guidelines for Use in Primary Care, Second Edition.  World Pharmacologist [facility]). Score between 0-7:  no or low risk or alcohol related problems. Score between 8-15:  moderate risk of  alcohol related problems. Score between 16-19:  high risk of alcohol related problems. Score 20 or above:  warrants further diagnostic evaluation for alcohol dependence and treatment.   CLINICAL FACTORS:   Severe Anxiety and/or Agitation Depression:   Hopelessness Severe   Musculoskeletal: Strength & Muscle Tone: within normal limits Gait & Station: normal Patient leans: N/A  Psychiatric Specialty Exam: Physical Exam  Nursing note and vitals reviewed.   ROS  Blood pressure 91/69, pulse 98, temperature 97.9 F (36.6 C), temperature source Oral, resp. rate 18, height 5\' 10"  (1.778 m), weight 70.8 kg (156 lb), SpO2 98 %.Body mass index is 22.38 kg/m.  General Appearance:Casual  Eye Contact:Fair  Speech:Clear and Coherent  Volume:Decreased  Mood:Anxious and Depressed  Affect:Congruent, depressed  Thought Process:Goal Directed  Orientation:Full (Time, Place, and Person)  Thought Content:depressed, paranoid  Suicidal Thoughts:Yes.with intent/plan  Homicidal Thoughts:No  Memory:Immediate;Good Recent;Fair Remote;[place]  Akathisia:No  Handed:Right  AIMS (if indicated):   Assets:Desire for Improvement Housing  ADL's:Impaired  Cognition:Impaired,Mild  Sleep: 5.75         COGNITIVE FEATURES THAT CONTRIBUTE TO RISK:  Polarized thinking    SUICIDE RISK:   high  PLAN OF CARE:  Daily contact with patient to assess and evaluate symptoms and progress in treatment and Medication management.  Pt is severely depressed, pt does not want to take Effexor, states it did not help him when he was taking it. Wants to try sth new.   Agrees to try Zoloft. Monitor SI.    Observation Level/Precautions:  15 minute checks  Laboratory:  CBC  Chemistry Profile UDS UA   Psychotherapy:    Medications:    Consultations:    Discharge Concerns:    Estimated LOS:  Other:     Physician Treatment Plan for Primary Diagnosis: <principal problem not specified> Long Term Goal(s): Improvement in symptoms so as ready for discharge  Short Term Goals: Ability to identify changes in lifestyle to reduce recurrence of condition will improve, Ability to verbalize feelings will improve, Ability to disclose and discuss suicidal ideas, Ability to demonstrate self-control will improve, Ability to identify and develop effective coping behaviors will improve, Ability to maintain clinical measurements within normal limits will improve, Compliance with prescribed medications will improve and Ability to identify triggers associated with substance abuse/mental health issues will improve  Physician Treatment Plan for Secondary Diagnosis: Active Problems:   Severe recurrent major depression without psychotic features ([place])  Long Term Goal(s): Improvement in symptoms so as ready for discharge  Short Term Goals: Ability to identify changes in lifestyle to reduce recurrence of condition will improve, Ability to verbalize feelings will improve, Ability to disclose and discuss suicidal ideas, Ability to demonstrate self-control will improve, Ability to identify and develop effective coping behaviors will improve, Ability to maintain clinical measurements within normal limits will improve, Compliance with prescribed medications will improve and Ability to identify triggers associated with substance abuse/mental health issues will improve     I certify that inpatient services furnished can reasonably be expected to improve the patient's condition.   Lenward Chancellor, MD 02/03/2018, 3:43 PM

## 2018-02-03 NOTE — Plan of Care (Signed)
Patient rated his depression and anxiety 8/10.Patient is pleasant and cooperative on approach.Compliant with medications.Appropriate with staff & peers.Appetite and energy level good.Support and encouragement given.

## 2018-02-03 NOTE — H&P (Signed)
Psychiatric Admission Assessment Adult  Patient Identification: Edgar White MRN:  [id number] Date of Evaluation:  02/03/2018 Chief Complaint:  Suicidal Principal Diagnosis: <principal problem not specified> Diagnosis:   Patient Active Problem List   Diagnosis Date Noted  . Cannabis use disorder, moderate, dependence ([place]) [F12.20] 11/04/2016  . Tobacco use disorder [F17.200] 11/04/2016  . Severe recurrent major depression without psychotic features ([place]) [F33.2] 11/04/2016  . Substance induced mood disorder ([place]) [F19.94] 10/13/2016  . Diabetes mellitus without complication ([place]) [Z66.0] 06/08/2016  . Anxiety state [F41.1]   . Polysubstance abuse ([place]) [F19.10]   . Major depressive disorder, recurrent severe without psychotic features ([place]) [F33.2] 04/13/2016   History of Present Illness:  I'm depressed" 52 year old man with a history of  Depression admitted for worsening depression and suicidal thoughts and wishes that he were dead. Also voiced plan to OD . pt reports depression getting worse for last 6 months and even worse for several weeks. 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  He has not been eating well and has lost  weight.  Feels nervous a lot.  Sleep pattern is irregular.  He is not taking any of his psychiatric medicine including not taking his diabetes medicine.  Reports paranoia- "people talking about me", denies AVH. Denies SI at this time,     Associated Signs/Symptoms: Depression Symptoms:  depressed mood, fatigue, difficulty concentrating, hopelessness, (Hypo) Manic Symptoms:   Anxiety Symptoms:  Excessive Worry, Psychotic Symptoms:  Paranoia, PTSD Symptoms: Negative Total Time spent with patient: 1 hour  Past Psychiatric History: depression since 2003, multiple psych hospitalization, last in March 2018, Op at [facility]. Several prior serious suicide attempts.  Has had ECT as well as antidepressants.   Does reasonably well when he is on his medicine.  Currently he is going to [place] and is taking a combination of Celexa and Latuda and thinks it is making him feel worse. Was not taking effexor for about 1 yr, was just restarted, wants to try sth new.     Is the patient at risk to self? Yes.    Has the patient been a risk to self in the past 6 months? Yes.    Has the patient been a risk to self within the distant past? Yes.    Is the patient a risk to others? No.  Has the patient been a risk to others in the past 6 months? No.  Has the patient been a risk to others within the distant past? No.   Prior Inpatient Therapy:   Prior Outpatient Therapy:    Alcohol Screening: 1. How often do you have a drink containing alcohol?: Never 2. How many drinks containing alcohol do you have on a typical day when you are drinking?: 1 or 2 3. How often do you have six or more drinks on one occasion?: Never AUDIT-C Score: 0 Intervention/Follow-up: AUDIT Score <7 follow-up not indicated Substance Abuse History in the last 12 months:  Yes.  THC- 2x/week Consequences of Substance Abuse: Negative Previous Psychotropic Medications: Yes  Psychological Evaluations:  Past Medical History:  Past Medical History:  Diagnosis Date  . Depression   . Diabetes mellitus without complication [facility])     Past Surgical History:  Procedure Laterality Date  . INGUINAL HERNIA REPAIR    . LUMBAR FUSION     Family History: History reviewed. No pertinent family history. Family Psychiatric  History: unknown Tobacco  Screening: Have you used any form of tobacco in the last 30 days? (Cigarettes, Smokeless Tobacco, Cigars, and/or Pipes): Yes Tobacco use, Select all that apply: 5 or more cigarettes per day Are you interested in Tobacco Cessation Medications?: No, patient refused Counseled patient on smoking cessation including recognizing danger situations, developing coping skills and basic information about quitting provided:  Refused/Declined practical counseling Social History:  Social History   Substance and Sexual Activity  Alcohol Use No   Comment: denies use     Social History   Substance and Sexual Activity  Drug Use Yes  . Types: Marijuana, Cocaine    Additional Social History:      living with his brother                        Allergies:  No Known Allergies Lab Results:  Results for orders placed or performed during the hospital encounter of 02/02/18 (from the past 48 hour(s))  Lipid panel     Status: Abnormal   Collection Time: 02/02/18  4:06 PM  Result Value Ref Range   Cholesterol 235 (H) 0 - 200 mg/dL   Triglycerides 146 <150 mg/dL   HDL 35 (L) >40 mg/dL   Total CHOL/HDL Ratio 6.7 RATIO   VLDL 29 0 - 40 mg/dL   LDL Cholesterol 171 (H) 0 - 99 mg/dL    Comment:        Total Cholesterol/HDL:CHD Risk Coronary Heart Disease Risk Table                     Men   Women  1/2 Average Risk   3.4   3.3  Average Risk       5.0   4.4  2 X Average Risk   9.6   7.1  3 X Average Risk  23.4   11.0        Use the calculated Patient Ratio above and the CHD Risk Table to determine the patient's CHD Risk.        ATP III CLASSIFICATION (LDL):  <100     mg/dL   Optimal  100-129  mg/dL   Near or Above                    Optimal  130-159  mg/dL   Borderline  160-189  mg/dL   High  >190     mg/dL   Very High Performed at [facility], [address] [place], [place] [address]   TSH     Status: None   Collection Time: 02/02/18  4:06 PM  Result Value Ref Range   TSH 1.029 0.350 - 4.500 uIU/mL    Comment: Performed by a 3rd Generation assay with a functional sensitivity of <=0.01 uIU/mL. Performed at [facility], [address] [place], [place] [address]   Glucose, capillary     Status: Abnormal   Collection Time: 02/03/18  7:02 AM  Result Value Ref Range   Glucose-Capillary 126 (H) 65 - 99 mg/dL   Comment 1 Notify RN   Glucose, capillary     Status:  Abnormal   Collection Time: 02/03/18 11:35 AM  Result Value Ref Range   Glucose-Capillary 170 (H) 65 - 99 mg/dL    Blood Alcohol level:  Lab Results  Component Value Date   ETH <10 02/02/2018   ETH <5 47/04/6282    Metabolic Disorder Labs:  Lab Results  Component Value Date  HGBA1C 8.3 (H) 10/13/2016   MPG 192 10/13/2016   MPG 140 04/15/2016   No results found for: PROLACTIN Lab Results  Component Value Date   CHOL 235 (H) 02/02/2018   TRIG 146 02/02/2018   HDL 35 (L) 02/02/2018   CHOLHDL 6.7 02/02/2018   VLDL 29 02/02/2018   LDLCALC 171 (H) 02/02/2018   LDLCALC 153 (H) 11/05/2016    Current Medications: Current Facility-Administered Medications  Medication Dose Route Frequency Provider Last Rate Last Dose  . acetaminophen (TYLENOL) tablet 650 mg  650 mg Oral Q6H PRN Clapacs, John T, MD      . alum & mag hydroxide-simeth (MAALOX/MYLANTA) 200-200-20 MG/5ML suspension 30 mL  30 mL Oral Q4H PRN Clapacs, John T, MD      . gemfibrozil (LOPID) tablet 600 mg  600 mg Oral BID AC Clapacs, Madie [place], MD   600 mg at 02/03/18 6789  . hydrOXYzine (ATARAX/VISTARIL) tablet 50 mg  50 mg Oral TID PRN Clapacs, John T, MD      . insulin aspart (novoLOG) injection 0-9 Units  0-9 Units Subcutaneous TID WC Clapacs, Madie Reno, MD   2 Units at 02/03/18 1204  . magnesium hydroxide (MILK OF MAGNESIA) suspension 30 mL  30 mL Oral Daily PRN Clapacs, John T, MD      . metFORMIN (GLUCOPHAGE) tablet 1,000 mg  1,000 mg Oral BID WC Clapacs, Madie Reno, MD   1,000 mg at 02/03/18 3810  . traZODone (DESYREL) tablet 100 mg  100 mg Oral QHS Clapacs, John T, MD      . venlafaxine [facility]) tablet 75 mg  75 mg Oral BID WC Clapacs, Madie Reno, MD   75 mg at 02/03/18 1751   PTA Medications: Medications Prior to Admission  Medication Sig Dispense Refill Last Dose  . benzocaine (ORAJEL) 10 % mucosal gel Use as directed in the mouth or throat 3 (three) times daily as needed for mouth pain. 5.3 g 1   . desvenlafaxine  (PRISTIQ) 50 MG 24 hr tablet Take 1 tablet (50 mg total) by mouth daily. 30 tablet 1   . gemfibrozil (LOPID) 600 MG tablet Take 1 tablet (600 mg total) by mouth 2 (two) times daily before a meal. 60 tablet 1   . hydrOXYzine (ATARAX/VISTARIL) 50 MG tablet Take 1 tablet (50 mg total) by mouth 3 (three) times daily as needed for anxiety. 90 tablet 1   . ibuprofen (ADVIL,MOTRIN) 800 MG tablet Take 1 tablet (800 mg total) by mouth every 6 (six) hours as needed for moderate pain. 30 tablet 0   . metFORMIN (GLUCOPHAGE) 1000 MG tablet Take 1 tablet (1,000 mg total) by mouth 2 (two) times daily with a meal. 60 tablet 1   . penicillin v potassium (VEETID) 500 MG tablet Take 1 tablet (500 mg total) by mouth every 6 (six) hours. 20 tablet 0   . traZODone (DESYREL) 100 MG tablet Take 1 tablet (100 mg total) by mouth at bedtime. 30 tablet 1     Musculoskeletal: Strength & Muscle Tone: within normal limits Gait & Station: normal Patient leans: N/A  Psychiatric Specialty Exam: Physical Exam  Nursing note and vitals reviewed.   ROS  Blood pressure 91/69, pulse 98, temperature 97.9 F (36.6 C), temperature source Oral, resp. rate 18, height 5\' 10"  (1.778 m), weight 70.8 kg (156 lb), SpO2 98 %.Body mass index is 22.38 kg/m.  General Appearance: Casual  Eye Contact:  Fair  Speech:  Clear and Coherent  Volume:  Decreased  Mood:  Anxious and Depressed  Affect:  Congruent, depressed  Thought Process:  Goal Directed  Orientation:  Full (Time, Place, and Person)  Thought Content:  depressed, paranoid  Suicidal Thoughts:  Yes.  with intent/plan  Homicidal Thoughts:  No  Memory:  Immediate;   Good Recent;   Fair Remote;   Fair  Judgement:  Fair  Insight:  Fair  Psychomotor Activity:  Decreased  Concentration:  Concentration: Fair  Recall:  [employer] of Knowledge:  Fair  Language:  Fair  Akathisia:  No  Handed:  Right  AIMS (if indicated):     Assets:  Desire for Improvement Housing  ADL's:   Impaired  Cognition:  Impaired,  Mild  Sleep:   5.75      Treatment Plan Summary: Daily contact with patient to assess and evaluate symptoms and progress in treatment and Medication management.  Pt is severely depressed, pt does not want to take Effexor, states it did not help him when he was taking it. Wants to try sth new.   Agrees to try Zoloft.   Observation Level/Precautions:  15 minute checks  Laboratory:  CBC Chemistry Profile UDS UA  Psychotherapy:    Medications:    Consultations:    Discharge Concerns:    Estimated LOS:  Other:     Physician Treatment Plan for Primary Diagnosis: <principal problem not specified> Long Term Goal(s): Improvement in symptoms so as ready for discharge  Short Term Goals: Ability to identify changes in lifestyle to reduce recurrence of condition will improve, Ability to verbalize feelings will improve, Ability to disclose and discuss suicidal ideas, Ability to demonstrate self-control will improve, Ability to identify and develop effective coping behaviors will improve, Ability to maintain clinical measurements within normal limits will improve, Compliance with prescribed medications will improve and Ability to identify triggers associated with substance abuse/mental health issues will improve  Physician Treatment Plan for Secondary Diagnosis: Active Problems:   Severe recurrent major depression without psychotic features ([place])  Long Term Goal(s): Improvement in symptoms so as ready for discharge  Short Term Goals: Ability to identify changes in lifestyle to reduce recurrence of condition will improve, Ability to verbalize feelings will improve, Ability to disclose and discuss suicidal ideas, Ability to demonstrate self-control will improve, Ability to identify and develop effective coping behaviors will improve, Ability to maintain clinical measurements within normal limits will improve, Compliance with prescribed medications will improve and  Ability to identify triggers associated with substance abuse/mental health issues will improve  I certify that inpatient services furnished can reasonably be expected to improve the patient's condition.    Lenward Chancellor, MD 6/15/20193:25 PM

## 2018-02-03 NOTE — [facility] Group Notes (Signed)
LCSW Group Therapy Note  02/03/2018 1:15pm  Type of Therapy and Topic:  Group Therapy:  Fears and Unhealthy Coping Skills  Participation Level:  Active   Description of Group:  The focus of this group was to discuss some of the prevalent fears that patients experience, and to identify the commonalities among group members.  An exercise was used to initiate the discussion, followed by writing on the white board a group-generated list of unhealthy coping and healthy coping techniques to deal with each fear.    Therapeutic Goals: 1. Patient will identify and describe 3 fears they experience 2. Patient will identify one positive coping strategy for each fear they experience 3. Patient will respond empathically to peers statements regarding fears they experience  Summary of Patient Progress:  The patient scored his mood at a 3 (10 best). Pt. discussed some of his prevalent fears that he has experienced and was able to identify the commonalities among group members.  Pt shared with the group that he is trying to "get back on his feet." Pt. was actively and appropriately engaged in the group. Patient was able to provide support and validation to other group members     Therapeutic Modalities [place], LCSW 02/03/2018 4:40 PM

## 2018-02-04 ENCOUNTER — Encounter: Payer: Self-pay | Admitting: Psychiatry

## 2018-02-04 LAB — GLUCOSE, CAPILLARY
GLUCOSE-CAPILLARY: 122 mg/dL — AB (ref 65–99)
GLUCOSE-CAPILLARY: 100 mg/dL — AB (ref 65–99)
Glucose-Capillary: 191 mg/dL — ABNORMAL HIGH (ref 65–99)
Glucose-Capillary: 85 mg/dL (ref 65–99)

## 2018-02-04 LAB — HEMOGLOBIN A1C
Hgb A1c MFr Bld: 5.8 % — ABNORMAL HIGH (ref 4.8–5.6)
Mean Plasma Glucose: 119.76 mg/dL

## 2018-02-04 MED ORDER — SERTRALINE HCL 25 MG PO TABS
25.0000 mg | ORAL_TABLET | Freq: Every day | ORAL | Status: DC
  Administered 2018-02-05: 25 mg via ORAL
  Filled 2018-02-04: qty 1

## 2018-02-04 MED ORDER — MIRTAZAPINE 15 MG PO TABS
15.0000 mg | ORAL_TABLET | Freq: Every day | ORAL | Status: DC
  Administered 2018-02-04 – 2018-02-06 (×3): 15 mg via ORAL
  Filled 2018-02-04 (×3): qty 1

## 2018-02-04 MED ORDER — METFORMIN HCL 500 MG PO TABS
500.0000 mg | ORAL_TABLET | Freq: Two times a day (BID) | ORAL | Status: DC
  Administered 2018-02-04 – 2018-02-07 (×6): 500 mg via ORAL
  Filled 2018-02-04 (×6): qty 1

## 2018-02-04 MED ORDER — GLUCERNA SHAKE PO LIQD
237.0000 mL | Freq: Three times a day (TID) | ORAL | Status: DC
  Administered 2018-02-04 – 2018-02-07 (×8): 237 mL via ORAL

## 2018-02-04 NOTE — [facility] Group Notes (Signed)
[facility] Group Notes:  (Nursing/MHT/Case Management/Adjunct)  Date:  02/04/2018  Time:  10:14 PM  Type of Therapy:  Group Therapy  Participation Level:  Active  Participation Quality:  Appropriate  Affect:  Appropriate  Cognitive:  Appropriate  Insight:  Appropriate  Engagement in Group:  Engaged  Modes of Intervention:  Discussion  Summary of Progress/Problems:  Kandis Fantasia 02/04/2018, 10:14 PM

## 2018-02-04 NOTE — Progress Notes (Addendum)
[facility] MD Progress Note  02/05/2018 12:13 PM Edgar White  MRN:  [id number]  Subjective:    Edgar White, Edgar White met with treatment team today. He is still depressed but no longer suicidal. Sleep is interrupted. He did not find Trazodone helpful. He did not do well on Zyprexa or Latuda, none of them he could afford. He wants to keep the Zoloft. No somatic complaints.  Principal Problem: Severe recurrent major depression without psychotic features ([place]) Diagnosis:   Patient Active Problem List   Diagnosis Date Noted  . Severe recurrent major depression without psychotic features ([place]) [F33.2] 11/04/2016    Priority: High  . Cannabis use disorder, moderate, dependence ([place]) [F12.20] 11/04/2016  . Tobacco use disorder [F17.200] 11/04/2016  . Substance induced mood disorder ([place]) [F19.94] 10/13/2016  . Diabetes mellitus without complication ([place]) [T05.6] 06/08/2016  . Anxiety state [F41.1]   . Polysubstance abuse ([place]) [F19.10]   . Major depressive disorder, recurrent severe without psychotic features ([place]) [F33.2] 04/13/2016   Total Time spent with patient: 20 minutes  Past Psychiatric History: depression.  Past Medical History:  Past Medical History:  Diagnosis Date  . Depression   . Diabetes mellitus without complication [facility])     Past Surgical History:  Procedure Laterality Date  . INGUINAL HERNIA REPAIR    . LUMBAR FUSION     Family History: History reviewed. No pertinent family history. Family Psychiatric  History: depression. Social History:  Social History   Substance and Sexual Activity  Alcohol Use No   Comment: denies use     Social History   Substance and Sexual Activity  Drug Use Yes  . Types: Marijuana, Cocaine    Social History   Socioeconomic History  . Marital status: Single    Spouse name: Not on file  . Number of children: Not on file  . Years of education: Not on file  . Highest education level: Not on file  Occupational History  . Not on file   Social Needs  . Financial resource strain: Not on file  . Food insecurity:    Worry: Not on file    Inability: Not on file  . Transportation needs:    Medical: Not on file    Non-medical: Not on file  Tobacco Use  . Smoking status: Current Every Day Smoker    Packs/day: 0.50    Types: Cigarettes  . Smokeless tobacco: Never Used  Substance and Sexual Activity  . Alcohol use: No    Comment: denies use  . Drug use: Yes    Types: Marijuana, Cocaine  . Sexual activity: Never  Lifestyle  . Physical activity:    Days per week: Not on file    Minutes per session: Not on file  . Stress: Not on file  Relationships  . Social connections:    Talks on phone: Not on file    Gets together: Not on file    Attends religious service: Not on file    Active member of club or organization: Not on file    Attends meetings of clubs or organizations: Not on file    Relationship status: Not on file  Other Topics Concern  . Not on file  Social History Narrative  . Not on file   Additional Social History:                         Sleep: Poor  Appetite:  Poor  Current Medications: Current Facility-Administered Medications  Medication Dose Route Frequency Provider Last Rate Last Dose  . acetaminophen (TYLENOL) tablet 650 mg  650 mg Oral Q6H PRN Clapacs, John T, MD      . alum & mag hydroxide-simeth (MAALOX/MYLANTA) 200-200-20 MG/5ML suspension 30 mL  30 mL Oral Q4H PRN Clapacs, John T, MD      . feeding supplement (GLUCERNA SHAKE) (GLUCERNA SHAKE) liquid 237 mL  237 mL Oral TID BM Lillyonna Armstead B, MD   237 mL at 02/04/18 2132  . insulin aspart (novoLOG) injection 0-9 Units  0-9 Units Subcutaneous TID WC Clapacs, Madie Reno, MD   1 Units at 02/05/18 0759  . magnesium hydroxide (MILK OF MAGNESIA) suspension 30 mL  30 mL Oral Daily PRN Clapacs, John T, MD      . metFORMIN (GLUCOPHAGE) tablet 500 mg  500 mg Oral BID WC Hiedi Touchton B, MD   500 mg at 02/05/18 0804  .  mirtazapine (REMERON) tablet 15 mg  15 mg Oral QHS Opal Dinning B, MD   15 mg at 02/04/18 2133  . QUEtiapine (SEROQUEL) tablet 100 mg  100 mg Oral QHS Shamaria Kavan B, MD      . sertraline (ZOLOFT) tablet 100 mg  100 mg Oral QHS Zeyna Mkrtchyan B, MD      . sertraline (ZOLOFT) tablet 50 mg  50 mg Oral QHS Dominico Rod B, MD        Lab Results:  Results for orders placed or performed during the hospital encounter of 02/02/18 (from the past 48 hour(s))  Glucose, capillary     Status: None   Collection Time: 02/03/18  4:14 PM  Result Value Ref Range   Glucose-Capillary 81 65 - 99 mg/dL   Comment 1 Notify RN   Glucose, capillary     Status: Abnormal   Collection Time: 02/03/18  9:59 PM  Result Value Ref Range   Glucose-Capillary 113 (H) 65 - 99 mg/dL   Comment 1 Notify RN   Glucose, capillary     Status: Abnormal   Collection Time: 02/04/18  7:06 AM  Result Value Ref Range   Glucose-Capillary 122 (H) 65 - 99 mg/dL   Comment 1 Notify RN   Glucose, capillary     Status: Abnormal   Collection Time: 02/04/18 11:25 AM  Result Value Ref Range   Glucose-Capillary 191 (H) 65 - 99 mg/dL   Comment 1 Notify RN   Glucose, capillary     Status: Abnormal   Collection Time: 02/04/18  4:15 PM  Result Value Ref Range   Glucose-Capillary 100 (H) 65 - 99 mg/dL   Comment 1 Notify RN   Glucose, capillary     Status: None   Collection Time: 02/04/18  8:19 PM  Result Value Ref Range   Glucose-Capillary 85 65 - 99 mg/dL   Comment 1 Notify RN   Glucose, capillary     Status: Abnormal   Collection Time: 02/05/18  6:59 AM  Result Value Ref Range   Glucose-Capillary 121 (H) 65 - 99 mg/dL   Comment 1 Notify RN   Glucose, capillary     Status: None   Collection Time: 02/05/18 11:32 AM  Result Value Ref Range   Glucose-Capillary 96 65 - 99 mg/dL   Comment 1 Notify RN     Blood Alcohol level:  Lab Results  Component Value Date   ETH <10 02/02/2018   ETH <5 95/18/8416     Metabolic Disorder Labs: Lab Results  Component Value Date  HGBA1C 5.8 (H) 02/02/2018   MPG 119.76 02/02/2018   MPG 192 10/13/2016   No results found for: PROLACTIN Lab Results  Component Value Date   CHOL 235 (H) 02/02/2018   TRIG 146 02/02/2018   HDL 35 (L) 02/02/2018   CHOLHDL 6.7 02/02/2018   VLDL 29 02/02/2018   LDLCALC 171 (H) 02/02/2018   LDLCALC 153 (H) 11/05/2016    Physical Findings: AIMS:  , ,  ,  ,    CIWA:    COWS:     Musculoskeletal: Strength & Muscle Tone: within normal limits Gait & Station: normal Patient leans: N/A  Psychiatric Specialty Exam: Physical Exam  Nursing note and vitals reviewed. Psychiatric: His speech is normal and behavior is normal. Thought content normal. Cognition and memory are normal. He expresses impulsivity. He exhibits a depressed mood.    Review of Systems  Neurological: Negative.   Psychiatric/Behavioral: Positive for depression and substance abuse. The patient has insomnia.   All other systems reviewed and are negative.   Blood pressure 98/71, pulse 89, temperature 98.5 F (36.9 C), temperature source Oral, resp. rate 16, height _0  (1.778 m), weight 70.8 kg (156 lb), SpO2 98 %.Body mass index is 22.38 kg/m.  General Appearance: Casual  Eye Contact:  Good  Speech:  Clear and Coherent  Volume:  Normal  Mood:  Depressed  Affect:  Flat  Thought Process:  Goal Directed and Descriptions of Associations: Intact  Orientation:  Full (Time, Place, and Person)  Thought Content:  WDL  Suicidal Thoughts:  No  Homicidal Thoughts:  No  Memory:  Immediate;   Fair Recent;   Fair Remote;   Fair  Judgement:  Poor  Insight:  Shallow  Psychomotor Activity:  Normal  Concentration:  Concentration: Fair and Attention Span: Fair  Recall:  [employer] of Knowledge:  Fair  Language:  Fair  Akathisia:  No  Handed:  Right  AIMS (if indicated):     Assets:  Communication Skills Desire for Improvement Housing Physical  Health Resilience Social Support  ADL's:  Intact  Cognition:  WNL  Sleep:  Number of Hours: 6.3     Treatment Plan Summary: Daily contact with patient to assess and evaluate symptoms and progress in treatment and Medication management   Edgar White is a 52 year old male with a history of dsepression and treatment noncompliance admitted for worsening depression and suicidal ideation.  #Suicidal ideation, resolved  #Mood -increase Zoloft to 100 mg nightly  -start Remeron 15 mg nightly -start Seroquel 100 mg nightlytly  #Diabetes -Lower Metformin to 500 mg BID due to diarrhea -SSI, ADA diet, CBG  #Dyslipidemia -declines Lopid  #Cannabis abuse -minimizes problems and declines treatment  #Smoking cessation -nicotine patch is available  #Labs -lipid panel shows elevate Chol and TG, TSH and A1C normal -EKG reviewed, NSR with QTc 440  #Disposition -discharge with the brother -follow up with [facility]    Orson Slick, MD 02/05/2018, 12:13 PM

## 2018-02-04 NOTE — Plan of Care (Signed)
Cooperative to care

## 2018-02-04 NOTE — Progress Notes (Signed)
[facility] MD Progress Note  02/04/2018 12:30 PM Edgar White  MRN:  [id number] Subjective:   " stomach upset, diarhea" Pt reports mild stomach upset and diarhea , states he usually have it when he gets metformin, encouraged to drink fluid, pt endorsing depression and anxiety, denies SI/HI.  Principal Problem: <principal problem not specified> Diagnosis:   Patient Active Problem List   Diagnosis Date Noted  . Cannabis use disorder, moderate, dependence ([place]) [F12.20] 11/04/2016  . Tobacco use disorder [F17.200] 11/04/2016  . Severe recurrent major depression without psychotic features ([place]) [F33.2] 11/04/2016  . Substance induced mood disorder ([place]) [F19.94] 10/13/2016  . Diabetes mellitus without complication ([place]) [V61.6] 06/08/2016  . Anxiety state [F41.1]   . Polysubstance abuse ([place]) [F19.10]   . Major depressive disorder, recurrent severe without psychotic features ([place]) [F33.2] 04/13/2016   Total Time spent with patient: 30 minutes  Past Psychiatric History: no new info  Past Medical History:  Past Medical History:  Diagnosis Date  . Depression   . Diabetes mellitus without complication [facility])     Past Surgical History:  Procedure Laterality Date  . INGUINAL HERNIA REPAIR    . LUMBAR FUSION     Family History: History reviewed. No pertinent family history. Family Psychiatric  History: no new info Social History:  Social History   Substance and Sexual Activity  Alcohol Use No   Comment: denies use     Social History   Substance and Sexual Activity  Drug Use Yes  . Types: Marijuana, Cocaine    Social History   Socioeconomic History  . Marital status: Single    Spouse name: Not on file  . Number of children: Not on file  . Years of education: Not on file  . Highest education level: Not on file  Occupational History  . Not on file  Social Needs  . Financial resource strain: Not on file  . Food insecurity:    Worry: Not on file    Inability: Not on file  .  Transportation needs:    Medical: Not on file    Non-medical: Not on file  Tobacco Use  . Smoking status: Current Every Day Smoker    Packs/day: 0.50    Types: Cigarettes  . Smokeless tobacco: Never Used  Substance and Sexual Activity  . Alcohol use: No    Comment: denies use  . Drug use: Yes    Types: Marijuana, Cocaine  . Sexual activity: Never  Lifestyle  . Physical activity:    Days per week: Not on file    Minutes per session: Not on file  . Stress: Not on file  Relationships  . Social connections:    Talks on phone: Not on file    Gets together: Not on file    Attends religious service: Not on file    Active member of club or organization: Not on file    Attends meetings of clubs or organizations: Not on file    Relationship status: Not on file  Other Topics Concern  . Not on file  Social History Narrative  . Not on file   Additional Social History:                         Sleep: Fair  Appetite:  Fair  Current Medications: Current Facility-Administered Medications  Medication Dose Route Frequency Provider Last Rate Last Dose  . acetaminophen (TYLENOL) tablet 650 mg  650 mg Oral Q6H PRN Clapacs,  Madie Reno, MD      . alum & mag hydroxide-simeth (MAALOX/MYLANTA) 200-200-20 MG/5ML suspension 30 mL  30 mL Oral Q4H PRN Clapacs, John T, MD      . gemfibrozil (LOPID) tablet 600 mg  600 mg Oral BID AC Clapacs, John T, MD   600 mg at 02/03/18 1646  . hydrOXYzine (ATARAX/VISTARIL) tablet 50 mg  50 mg Oral TID PRN Clapacs, John T, MD      . insulin aspart (novoLOG) injection 0-9 Units  0-9 Units Subcutaneous TID WC Clapacs, Madie Reno, MD   2 Units at 02/04/18 1128  . magnesium hydroxide (MILK OF MAGNESIA) suspension 30 mL  30 mL Oral Daily PRN Clapacs, John T, MD      . metFORMIN (GLUCOPHAGE) tablet 1,000 mg  1,000 mg Oral BID WC Clapacs, Madie Reno, MD   1,000 mg at 02/04/18 0829  . sertraline (ZOLOFT) tablet 50 mg  50 mg Oral Daily Lenward Chancellor, MD   50 mg at  02/04/18 0829  . traZODone (DESYREL) tablet 100 mg  100 mg Oral QHS Clapacs, Madie Reno, MD   100 mg at 02/03/18 2220    Lab Results:  Results for orders placed or performed during the hospital encounter of 02/02/18 (from the past 48 hour(s))  Lipid panel     Status: Abnormal   Collection Time: 02/02/18  4:06 PM  Result Value Ref Range   Cholesterol 235 (H) 0 - 200 mg/dL   Triglycerides 146 <150 mg/dL   HDL 35 (L) >40 mg/dL   Total CHOL/HDL Ratio 6.7 RATIO   VLDL 29 0 - 40 mg/dL   LDL Cholesterol 171 (H) 0 - 99 mg/dL    Comment:        Total Cholesterol/HDL:CHD Risk Coronary Heart Disease Risk Table                     Men   Women  1/2 Average Risk   3.4   3.3  Average Risk       5.0   4.4  2 X Average Risk   9.6   7.1  3 X Average Risk  23.4   11.0        Use the calculated Patient Ratio above and the CHD Risk Table to determine the patient's CHD Risk.        ATP III CLASSIFICATION (LDL):  <100     mg/dL   Optimal  100-129  mg/dL   Near or Above                    Optimal  130-159  mg/dL   Borderline  160-189  mg/dL   High  >190     mg/dL   Very High Performed at [facility], [address] [place], [place] [address]   TSH     Status: None   Collection Time: 02/02/18  4:06 PM  Result Value Ref Range   TSH 1.029 0.350 - 4.500 uIU/mL    Comment: Performed by a 3rd Generation assay with a functional sensitivity of <=0.01 uIU/mL. Performed at [facility], [address] [place],  [address]   Hemoglobin A1c     Status: Abnormal   Collection Time: 02/02/18  4:06 PM  Result Value Ref Range   Hgb A1c MFr Bld 5.8 (H) 4.8 - 5.6 %    Comment: (NOTE) Pre diabetes:          5.7%-6.4% Diabetes:              >  6.4% Glycemic control for   <7.0% adults with diabetes    Mean Plasma Glucose 119.76 mg/dL    Comment: Performed at [facility] [address], [place], [place] [address]  Glucose, capillary     Status: Abnormal   Collection Time:  02/03/18  7:02 AM  Result Value Ref Range   Glucose-Capillary 126 (H) 65 - 99 mg/dL   Comment 1 Notify RN   Glucose, capillary     Status: Abnormal   Collection Time: 02/03/18 11:35 AM  Result Value Ref Range   Glucose-Capillary 170 (H) 65 - 99 mg/dL  Glucose, capillary     Status: None   Collection Time: 02/03/18  4:14 PM  Result Value Ref Range   Glucose-Capillary 81 65 - 99 mg/dL   Comment 1 Notify RN   Glucose, capillary     Status: Abnormal   Collection Time: 02/03/18  9:59 PM  Result Value Ref Range   Glucose-Capillary 113 (H) 65 - 99 mg/dL   Comment 1 Notify RN   Glucose, capillary     Status: Abnormal   Collection Time: 02/04/18  7:06 AM  Result Value Ref Range   Glucose-Capillary 122 (H) 65 - 99 mg/dL   Comment 1 Notify RN   Glucose, capillary     Status: Abnormal   Collection Time: 02/04/18 11:25 AM  Result Value Ref Range   Glucose-Capillary 191 (H) 65 - 99 mg/dL   Comment 1 Notify RN     Blood Alcohol level:  Lab Results  Component Value Date   ETH <10 02/02/2018   ETH <5 32/20/2542    Metabolic Disorder Labs: Lab Results  Component Value Date   HGBA1C 5.8 (H) 02/02/2018   MPG 119.76 02/02/2018   MPG 192 10/13/2016   No results found for: PROLACTIN Lab Results  Component Value Date   CHOL 235 (H) 02/02/2018   TRIG 146 02/02/2018   HDL 35 (L) 02/02/2018   CHOLHDL 6.7 02/02/2018   VLDL 29 02/02/2018   LDLCALC 171 (H) 02/02/2018   LDLCALC 153 (H) 11/05/2016    Physical Findings: AIMS:  , ,  ,  ,    CIWA:    COWS:     Musculoskeletal: Strength & Muscle Tone: within normal limits Gait & Station: normal Patient leans: N/A  Psychiatric Specialty Exam: Physical Exam  Nursing note and vitals reviewed.   ROS  Blood pressure 96/65, pulse (!) 108, temperature 98.5 F (36.9 C), temperature source Oral, resp. rate 18, height 5\' 10"  (1.778 m), weight 70.8 kg (156 lb), SpO2 98 %.Body mass index is 22.38 kg/m.  General Appearance:Casual  Eye  Contact:Fair  Speech:Clear and Coherent  Volume:Decreased  Mood:Anxious and Depressed  Affect:Congruent,   Thought Process:Goal Directed  Orientation:Full (Time, Place, and Person)  Thought Content:depressed, paranoid  Suicidal Thoughts:denies  Homicidal Thoughts:No  Memory:Immediate;Good Recent;Fair Remote;[place]  Akathisia:No  Handed:Right  AIMS (if indicated):   Assets:Desire for Improvement Housing  ADL's:Impaired  Cognition:Impaired,Mild  Sleep: 5.75        Treatment Plan Summary: Daily contact with patient to assess and evaluate symptoms and progress in treatment and Medication management.  Pt is  depressed,  Decrease zoloft due to stomach upset.    Lenward Chancellor, MD 02/04/2018, 12:30 PM

## 2018-02-04 NOTE — [facility] Group Notes (Signed)
LCSW Group Therapy Note 02/04/2018 1:15pm  Type of Therapy and Topic: Group Therapy: Feelings Around Returning Home & Establishing a Supportive Framework and Supporting Oneself When Supports Not Available  Participation Level: Did Not Attend  Description of Group:  Patients first processed thoughts and feelings about upcoming discharge. These included fears of upcoming changes, lack of change, new living environments, judgements and expectations from others and overall stigma of mental health issues. The group then discussed the definition of a supportive framework, what that looks and feels like, and how do to discern it from an unhealthy non-supportive network. The group identified different types of supports as well as what to do when your family/friends are less than helpful or unavailable  Therapeutic Goals  1. Patient will identify one healthy supportive network that they can use at discharge. 2. Patient will identify one factor of a supportive framework and how to tell it from an unhealthy network. 3. Patient able to identify one coping skill to use when they do not have positive supports from others. 4. Patient will demonstrate ability to communicate their needs through discussion and/or role plays.  Summary of Patient Progress:  Pt was invited to attend group but chose not to attend. CSW will continue to encourage pt to attend group throughout their admission.   Therapeutic Modalities Cognitive Behavioral Therapy Motivational Interviewing   Cheree Ditto, LCSW 02/04/2018 12:38 PM

## 2018-02-04 NOTE — Plan of Care (Signed)
Patient is alert and oriented X 4, denies SI, HI and AVH. Patient affect is pleasant but depressed. Patient rates depression 7/10 hopelessness 6/10 and anxiety 8/10. Patient is isolative but goal for today is to stay out of bed. Patient is compliant with medications. Nurse will encourage group meetings. Problem: Education: Goal: Utilization of techniques to improve thought processes will improve Outcome: Progressing Goal: Knowledge of the prescribed therapeutic regimen will improve Outcome: Progressing   Problem: Coping: Goal: Coping ability will improve Outcome: Progressing Goal: Will verbalize feelings Outcome: Progressing

## 2018-02-04 NOTE — BHH Counselor (Signed)
Adult Comprehensive Assessment  Patient ID: Edgar White, male   DOB: 09-14-1965, 52 y.o.   MRN: [id number]  Information Source: Information source: Patient  Current Stressors:  Patient states their primary concerns and needs for treatment are:: pt reports he got in a bad place and his symptoms of anxiety increase due to being unemployed Patient states their goals for this hospitilization and ongoing recovery are:: pt reports he would like to get adjusted to his psych medications Educational / Learning stressors: none reported Employment / Job issues: none reported Family Relationships: "not goodPublishing copy / Lack of resources (include bankruptcy): unemployed Housing / Lack of housing: resides with brother Physical health (include injuries & life threatening diseases): Diabetes Social relationships: none reported Substance abuse: THC and cocaine Bereavement / Loss: parents   Living/Environment/Situation:  Living Arrangements: Other relatives Who else lives in the home?: brother  How long has patient lived in current situation?: 1 year What is atmosphere in current home: Temporary  Family History:  Marital status: Single Are you sexually active?: No What is your sexual orientation?: Gay Has your sexual activity been affected by drugs, alcohol, medication, or emotional stress?: Pt states he is "not able to perform" due to medications and stress  Does patient have children?: No  Childhood History:  By whom was/is the patient raised?: Both parents Description of patient's relationship with caregiver when they were a child: Close with parents, closer with his mother  How were you disciplined when you got in trouble as a child/adolescent?: Pt reports he was very spoiled as a child and was not disciplined often. Does patient have siblings?: Yes Number of Siblings: 3 Description of patient's current relationship with siblings: Pt had a conflictual relationship with his siblings  after the death of his mother. However, recently their relationship has improved and he is getting close with them again. Did patient suffer any verbal/emotional/physical/sexual abuse as a child?: No Did patient suffer from severe childhood neglect?: No Has patient ever been sexually abused/assaulted/raped as an adolescent or adult?: No Was the patient ever a victim of a crime or a disaster?: No Witnessed domestic violence?: No Has patient been effected by domestic violence as an adult?: No  Education:  Highest grade of school patient has completed: college degree Currently a student?: No Learning disability?: No  Employment/Work Situation:   Employment situation: Unemployed What is the longest time patient has a held a job?: 7 years Where was the patient employed at that time?: Therapist, 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 Did You Receive Any Psychiatric Treatment/Services While in Passenger transport manager?: No Are There Guns or Other Weapons in [place]?: No  Financial Resources:   Financial resources: No income Does patient have a Programmer, applications or guardian?: No  Alcohol/Substance Abuse:   What has been your use of drugs/alcohol within the last 12 months?: THC 2-3 times daily, cocaine 6 months ago "once in a while" pt unsured of the amount taken If attempted suicide, did drugs/alcohol play a role in this?: No Alcohol/Substance Abuse Treatment Hx: Denies past history Has alcohol/substance abuse ever caused legal problems?: No  Social Support System:   Pensions consultant Support System: Fair Dietitian Support System: sister Type of faith/religion: Christian How does patient's faith help to cope with current illness?: praying  Leisure/Recreation:   Leisure and Hobbies: Exercise, walking dogs, spending time with friends   Strengths/Needs:   What is the patient's perception of their strengths?: "people's person, dedicated and try to be positive" Patient states they  can use these  personal strengths during their treatment to contribute to their recovery: "having a positive attitude helps me, I am goingto persev through this" Patient states these barriers may affect/interfere with their treatment: cost Patient states these barriers may affect their return to the community: none reported  Discharge Plan:   Currently receiving community mental health services: Yes (From Whom)([facility]) Patient states concerns and preferences for aftercare planning are: pt reports he has an upcoming appointment at [facility] on July 6th Patient states they will know when they are safe and ready for discharge when: "when I feel better about the depression" Does patient have access to transportation?: Yes(siblings will pick him up) Does patient have financial barriers related to discharge medications?: No Patient description of barriers related to discharge medications: cannot afford Will patient be returning to same living situation after discharge?: Yes  Summary/Recommendations:   Summary and Recommendations (to be completed by the evaluator): Patient is a 52 year old male admitted voluntarily with a history of Depression admitted for worsening depression and suicidal thoughts and wishes that he were dead. Also voiced plan to overdose. Patient reports depression getting worse for last 6 months and even worse for several weeks. 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  He has not been eating well and has lost weight.  Feels nervous a lot.  Sleep pattern is irregular.  He is not taking any of his psychiatric medicine including not taking his diabetes medicine.  Reports paranoia. Patient will benefit from crisis stabilization, medication evaluation, group therapy and psychoeducation. In addition to case management for discharge planning. At discharge it is recommended that patient adhere to the established discharge plan and continue treatment.    Edgar White  Edgar White. 02/04/2018

## 2018-02-04 NOTE — Plan of Care (Signed)
Patient is doing well in the unit encouraged to participate more in group activities and apply his coping skills well, patient verbalize understanding of  information given, patient  cooperating with his medications , sleeping long hours at a time without any interruptions, contract for safety of self and others denies any SI/HI and no signs of AVH at this time. Problem: Education: Goal: Utilization of techniques to improve thought processes will improve Outcome: Progressing Goal: Knowledge of the prescribed therapeutic regimen will improve Outcome: Progressing   Problem: Coping: Goal: Coping ability will improve Outcome: Progressing Goal: Will verbalize feelings Outcome: Progressing

## 2018-02-04 NOTE — Progress Notes (Signed)
Patient refused lopid medication stating he does not take it at home.

## 2018-02-04 NOTE — Progress Notes (Signed)
NUTRITION ASSESSMENT  Pt identified as at risk on the Malnutrition Screen Tool  INTERVENTION: Had initially discussed providing Ensure Enlive to patient, but noted dose of Metformin was decreased today due to abdominal discomfort and CBGs are now trending up. Diet was also changed from Regular to Carbohydrate Modified.  For now provide Glucerna Shake po TID, each supplement provides 220 kcal and 10 grams of protein. Patient prefers butter pecan flavor.  Patient would actually benefit from higher calorie and protein content in Ensure Enlive, so will continue to monitor glycemic control.  Encouraged adequate intake of calories and protein from meals, snacks, and oral nutrition supplements. Discussed goal of preventing any further unintentional weight loss or loss of lean body mass. With adequate oral intake and glycemic control, goal will be for patient to begin slowly gaining weight back to UBW.  NUTRITION DIAGNOSIS: Unintentional weight loss related to sub-optimal intake as evidenced by pt report.  Goal: Pt to meet >/= 90% of their estimated nutrition needs.  Monitor:  PO intake  Assessment: 52 y.o. male with PMHx of depression, anxiety, polysubstance abuse, DM who presented voluntarily for worsening depression and suicidal thoughts. Per HPI he has not been eating well, losing weight, and not taking his DM medications regularly.  Met with patient in his room. He is known to this RD from a previous admission in 10/2016. Patient reports his appetite and intake have been decreased for the past year now. He reports it is related to anxiety and depression. Last year he was having dental pain and now he is s/p removal of multiple upper and lower molars to both sides of his mouth. He reports it is very difficult to chew now and it takes him a while to eat. He refuses to have meat chopped on trays and prefers to cut it up himself. Discussed softer meat options available on menu. Also discussed other  sources of protein that are softer including beans, yogurt, and milk.  Patient's UBW was 172 lbs. He has now lost almost 20 lbs (11.6% body weight) over the past year.  Meal Completion: 85-100%  Medications reviewed and include: Novolog 0-9 units TID, metformin 500 mg BID, Remeron 15 mg QHS, sertraline.  Labs reviewed: CBG 81-191 past 24 hrs. HgbA1c 5.8 on 6/14.  Patient is at risk for malnutrition.  Height: Ht Readings from Last 1 Encounters:  02/02/18 5' 10" (1.778 m)    Weight: Wt Readings from Last 1 Encounters:  02/02/18 156 lb (70.8 kg)    Weight Hx: Wt Readings from Last 10 Encounters:  02/02/18 156 lb (70.8 kg)  02/02/18 148 lb (67.1 kg)  11/04/16 167 lb (75.8 kg)  11/03/16 174 lb (78.9 kg)  10/13/16 172 lb (78 kg)  10/12/16 179 lb (81.2 kg)  06/08/16 168 lb (76.2 kg)  04/13/16 158 lb (71.7 kg)    BMI:  Body mass index is 22.38 kg/m. Pt meets criteria for normal weight based on current BMI.  Estimated Nutritional Needs: Kcal: 25-30 kcal/kg Protein: > 1 gram protein/kg Fluid: 1 ml/kcal  Diet Order:  Diet Order           Diet Carb Modified Fluid consistency: Thin; Room service appropriate? Yes  Diet effective now         Pt is also offered choice of unit snacks mid-morning and mid-afternoon.    Stephens, MS, RD, LDN Office: [phone] Pager: [phone] After Hours/Weekend Pager: [phone]   

## 2018-02-04 NOTE — Progress Notes (Addendum)
Patient stayed in the milieu, pleasant and cooperative. Interacted with staff and peers appropriately. Denied thoughts of self harm. CBG 113 at HS.  Attended HS activities and received medications. Currently in bed resting and staff continue to monitor.

## 2018-02-05 DIAGNOSIS — F332 Major depressive disorder, recurrent severe without psychotic features: Principal | ICD-10-CM

## 2018-02-05 LAB — GLUCOSE, CAPILLARY
GLUCOSE-CAPILLARY: 96 mg/dL (ref 65–99)
Glucose-Capillary: 121 mg/dL — ABNORMAL HIGH (ref 65–99)
Glucose-Capillary: 107 mg/dL — ABNORMAL HIGH (ref 65–99)
Glucose-Capillary: 113 mg/dL — ABNORMAL HIGH (ref 65–99)

## 2018-02-05 MED ORDER — SERTRALINE HCL 100 MG PO TABS
100.0000 mg | ORAL_TABLET | Freq: Every day | ORAL | Status: DC
  Filled 2018-02-05: qty 1

## 2018-02-05 MED ORDER — SERTRALINE HCL 50 MG PO TABS
50.0000 mg | ORAL_TABLET | Freq: Every day | ORAL | Status: AC
  Administered 2018-02-05: 50 mg via ORAL
  Filled 2018-02-05: qty 2
  Filled 2018-02-05: qty 1

## 2018-02-05 MED ORDER — SERTRALINE HCL 100 MG PO TABS: 100.0000 mg | ORAL_TABLET | Freq: Every day | ORAL | 1 refills | Status: DC

## 2018-02-05 MED ORDER — QUETIAPINE FUMARATE 100 MG PO TABS
100.0000 mg | ORAL_TABLET | Freq: Every day | ORAL | Status: DC
  Administered 2018-02-05 – 2018-02-06 (×2): 100 mg via ORAL
  Filled 2018-02-05 (×3): qty 1

## 2018-02-05 MED ORDER — QUETIAPINE FUMARATE 100 MG PO TABS: 100.0000 mg | ORAL_TABLET | Freq: Every day | ORAL | 1 refills | Status: DC

## 2018-02-05 MED ORDER — SERTRALINE HCL 50 MG PO TABS: 50.0000 mg | ORAL_TABLET | Freq: Every day | ORAL | Status: DC

## 2018-02-05 MED ORDER — METFORMIN HCL 500 MG PO TABS: 500.0000 mg | ORAL_TABLET | Freq: Two times a day (BID) | ORAL | 1 refills | Status: DC

## 2018-02-05 NOTE — Progress Notes (Signed)
D- Patient alert and oriented. Patient presents in a pleasant mood on assessment stating that he slept "so-so" last night "I don't get much rest in here". Patient rates his depression a "6/10" and his anxiety a "7/10" and states to this writer that he feels this way because "I just want to get out of here". Patient denies SI, HI, AVH, and pain at this time. Patient's goal for today is to "be more active".  A- Scheduled medications administered to patient, per MD orders. Support and encouragement provided.  Routine safety checks conducted every 15 minutes.  Patient informed to notify staff with problems or concerns.  R- No adverse drug reactions noted. Patient contracts for safety at this time. Patient compliant with medications and treatment plan. Patient receptive, calm, and cooperative. Patient interacts well with others on the unit.  Patient remains safe at this time.

## 2018-02-05 NOTE — Plan of Care (Signed)
Pt cooperative and in the dayroom interacting with his peers. Pt denies SI/HI. Pt compliant with medication but just has a question about his sertraline amount(so refused the 100 mg but took the 50 mg) which he will address with the doctor tomorrow. Pt is receptive to treatment and safety maintained on unit. Will continue to monitor. Problem: Education: Goal: Utilization of techniques to improve thought processes will improve Outcome: Progressing Goal: Knowledge of the prescribed therapeutic regimen will improve Outcome: Progressing   Problem: Coping: Goal: Coping ability will improve Outcome: Progressing Goal: Will verbalize feelings Outcome: Progressing   Problem: Health Behavior/Discharge Planning: Goal: Ability to manage health-related needs will improve Outcome: Progressing   Problem: Clinical Measurements: Goal: Ability to maintain clinical measurements within normal limits will improve Outcome: Progressing   Problem: Activity: Goal: Risk for activity intolerance will decrease Outcome: Progressing   Problem: Nutrition: Goal: Adequate nutrition will be maintained Outcome: Progressing

## 2018-02-05 NOTE — Progress Notes (Signed)
Patient refused morning dose of Lopid because "I'm not going to be taking that when I get home".

## 2018-02-05 NOTE — Plan of Care (Signed)
Patient has the ability to improve thought processes and verbalizes understanding of his prescribed therapeutic regimen and has not voiced any further questions or concerns at this time. Patient denies SI/HI/AVH, but rates his depression level a "6/10" and his anxiety "7/10" stating to this writer that he's feeling this way because "I just want to get out of here. Patient has the ability to manage his health-related needs and has been working towards maintaining his clinical measurements within normal limits. Patient has remained free from infection and he has maintained adequate nutrition. Patient's risk for activity intolerance as well as impaired skin integrity has decreased. Patient's overall level of comfort has improved. Patient has remained free from injury and is safe on the unit at this time.  Problem: Education: Goal: Utilization of techniques to improve thought processes will improve Outcome: Progressing Goal: Knowledge of the prescribed therapeutic regimen will improve Outcome: Progressing   Problem: Coping: Goal: Coping ability will improve Outcome: Progressing Goal: Will verbalize feelings Outcome: Progressing   Problem: Education: Goal: Knowledge of General Education information will improve Outcome: Progressing   Problem: Health Behavior/Discharge Planning: Goal: Ability to manage health-related needs will improve Outcome: Progressing   Problem: Clinical Measurements: Goal: Ability to maintain clinical measurements within normal limits will improve Outcome: Progressing Goal: Will remain free from infection Outcome: Progressing   Problem: Activity: Goal: Risk for activity intolerance will decrease Outcome: Progressing   Problem: Nutrition: Goal: Adequate nutrition will be maintained Outcome: Progressing   Problem: Coping: Goal: Level of anxiety will decrease Outcome: Progressing   Problem: Pain Managment: Goal: General experience of comfort will  improve Outcome: Progressing   Problem: Safety: Goal: Ability to remain free from injury will improve Outcome: Progressing   Problem: Skin Integrity: Goal: Risk for impaired skin integrity will decrease Outcome: Progressing

## 2018-02-05 NOTE — Progress Notes (Signed)
Recreation Therapy Notes  INPATIENT RECREATION THERAPY ASSESSMENT  Patient Details Name: Edgar White MRN: [id number] DOB: 11-18-1965 Today's Date: 02/05/2018       Information Obtained From: Patient  Able to Participate in Assessment/Interview: Yes  Patient Presentation: Responsive  Reason for Admission (Per Patient): Other (Comments)(depression, anxiety)  Patient Stressors: Work  Radiographer, therapeutic:   Exercise, Other (Comment)(count to 10, play with dog, cook)  Leisure Interests (2+):  Exercise - Walking(Cook)  Frequency of Recreation/Participation: Weekly  Awareness of Community Resources:  Yes  Community Resources:  [employer]  Current Use: Yes  If no, Barriers?:    Expressed Interest in [place] of Residence:  [place]  Patient Main Form of Transportation: Musician  Patient Strengths:  Honest  Patient Identified Areas of Improvement:  Patience  Patient Goal for Hospitalization:  To get better and have a better plan  Current SI (including self-harm):  No  Current HI:  No  Current AVH: No  Staff Intervention Plan: Group Attendance, Collaborate with Interdisciplinary Treatment Team  Consent to Intern Participation: N/A  Cyril Railey 02/05/2018, 3:39 PM

## 2018-02-05 NOTE — Progress Notes (Signed)
Recreation Therapy Notes  Date: 02/05/2018  Time: 9:30 am  Location: Craft Room  Behavioral response: Appropriate   Intervention Topic: Happiness  Discussion/Intervention:  Group content today was focused on Happiness. The group defined happiness and stated reasons they are and are not happy at times. Participants identified reasons they are normally happy and why. Individuals expressed how not being happy affects themselves and others. Patients stated reasons why happiness is important to them. The group described how they feel when they are happy. Individuals participated in the intervention "What is happiness" where they defined what happiness means to them.   Clinical Observations/Feedback:  Patient came to group and was focused on what peers and staff had to say about happiness. He was social with peers and staff while participating in the intervention.  Latanga Nedrow LRT/CTRS         Samora Jernberg 02/05/2018 12:35 PM

## 2018-02-05 NOTE — BHH Group Notes (Signed)
LCSW Group Note  02/05/2018  Time: 1PM   Type of Therapy and Topic:  Group Therapy:  Overcoming Obstacles   Participation Level:  Did Not Attend   Description of Group:   In this group patients will be encouraged to explore what they see as obstacles to their own wellness and recovery. They will be guided to discuss their thoughts, feelings, and behaviors related to these obstacles. The group will process together ways to cope with barriers, with attention given to specific choices patients can make. Each patient will be challenged to identify changes they are motivated to make in order to overcome their obstacles. This group will be process-oriented, with patients participating in exploration of their own experiences, giving and receiving support, and processing challenge from other group members.   Therapeutic Goals: 1. Patient will identify personal and current obstacles as they relate to admission. 2. Patient will identify barriers that currently interfere with their wellness or overcoming obstacles.  3. Patient will identify feelings, thought process and behaviors related to these barriers. 4. Patient will identify two changes they are willing to make to overcome these obstacles:      Summary of Patient Progress   Pt was invited to attend group but chose not to attend. CSW will continue to encourage pt to attend group throughout their admission.   Therapeutic Modalities:   Cognitive Behavioral Therapy Solution Focused Therapy Motivational Interviewing Relapse Prevention Therapy  Alden Hipp, MSW, LCSW Clinical Social Worker 02/05/2018 2:01 PM

## 2018-02-05 NOTE — Progress Notes (Signed)
Patient was not given his lunch time meal coverage because his CBG was 96.

## 2018-02-05 NOTE — Progress Notes (Signed)
   02/05/18 1115  Clinical Encounter Type  Visited With Patient  Visit Type Initial;Spiritual support;Behavioral Health  Referral From Care management  Consult/Referral To Chaplain  Spiritual Encounters  Spiritual Needs Other (Comment)   [place] attended PT's care team meeting. PT feels that he has made progress toward his goals of handleing his depression better. PT will likely be discharged on Wednesday 07 February 2018. PT will be staying with his brother in [place]. [place] will follow up as needed.

## 2018-02-05 NOTE — Progress Notes (Signed)
   Mr. Edgar White attended group on tonight where he actively and willingly  Participated.  He pulled his sheet and read it where it asked him to name is favorite season. He shared with the group how his favorite season is summer.  He continued to share how he enjoy the beaches and he feels that there is more great things to do during the summer. MHT noticed the excitement in his voice and smile on his face as he talked about his favorite season summer.  MHT encouraged him to continue to enjoy the sunshine.

## 2018-02-05 NOTE — Tx Team (Addendum)
Interdisciplinary Treatment and Diagnostic Plan Update  02/05/2018 Time of Session: [place] MRN: [id number]  Principal Diagnosis: Severe recurrent major depression without psychotic features [facility])  Secondary Diagnoses: Principal Problem:   Severe recurrent major depression without psychotic features ([place]) Active Problems:   Diabetes mellitus without complication ([place])   Cannabis use disorder, moderate, dependence ([place])   Tobacco use disorder   Current Medications:  Current Facility-Administered Medications  Medication Dose Route Frequency Provider Last Rate Last Dose  . acetaminophen (TYLENOL) tablet 650 mg  650 mg Oral Q6H PRN Clapacs, John T, MD      . alum & mag hydroxide-simeth (MAALOX/MYLANTA) 200-200-20 MG/5ML suspension 30 mL  30 mL Oral Q4H PRN Clapacs, John T, MD      . feeding supplement (GLUCERNA SHAKE) (GLUCERNA SHAKE) liquid 237 mL  237 mL Oral TID BM Pucilowska, Jolanta B, MD   237 mL at 02/04/18 2132  . insulin aspart (novoLOG) injection 0-9 Units  0-9 Units Subcutaneous TID WC Clapacs, Madie Reno, MD   1 Units at 02/05/18 0759  . magnesium hydroxide (MILK OF MAGNESIA) suspension 30 mL  30 mL Oral Daily PRN Clapacs, John T, MD      . metFORMIN (GLUCOPHAGE) tablet 500 mg  500 mg Oral BID WC Pucilowska, Jolanta B, MD   500 mg at 02/05/18 0804  . mirtazapine (REMERON) tablet 15 mg  15 mg Oral QHS Pucilowska, Jolanta B, MD   15 mg at 02/04/18 2133  . QUEtiapine (SEROQUEL) tablet 100 mg  100 mg Oral QHS Pucilowska, Jolanta B, MD      . sertraline (ZOLOFT) tablet 100 mg  100 mg Oral QHS Pucilowska, Jolanta B, MD      . sertraline (ZOLOFT) tablet 50 mg  50 mg Oral QHS Pucilowska, Jolanta B, MD       PTA Medications: Medications Prior to Admission  Medication Sig Dispense Refill Last Dose  . benzocaine (ORAJEL) 10 % mucosal gel Use as directed in the mouth or throat 3 (three) times daily as needed for mouth pain. 5.3 g 1   . desvenlafaxine (PRISTIQ) 50 MG 24 hr  tablet Take 1 tablet (50 mg total) by mouth daily. 30 tablet 1   . gemfibrozil (LOPID) 600 MG tablet Take 1 tablet (600 mg total) by mouth 2 (two) times daily before a meal. 60 tablet 1   . hydrOXYzine (ATARAX/VISTARIL) 50 MG tablet Take 1 tablet (50 mg total) by mouth 3 (three) times daily as needed for anxiety. 90 tablet 1   . ibuprofen (ADVIL,MOTRIN) 800 MG tablet Take 1 tablet (800 mg total) by mouth every 6 (six) hours as needed for moderate pain. 30 tablet 0   . metFORMIN (GLUCOPHAGE) 1000 MG tablet Take 1 tablet (1,000 mg total) by mouth 2 (two) times daily with a meal. 60 tablet 1   . penicillin v potassium (VEETID) 500 MG tablet Take 1 tablet (500 mg total) by mouth every 6 (six) hours. 20 tablet 0   . traZODone (DESYREL) 100 MG tablet Take 1 tablet (100 mg total) by mouth at bedtime. 30 tablet 1     Patient Stressors: Financial difficulties Health problems  Patient Strengths: Average or above average intelligence Supportive family/friends  Treatment Modalities: Medication Management, Group therapy, Case management,  1 to 1 session with clinician, Psychoeducation, Recreational therapy.   Physician Treatment Plan for Primary Diagnosis: Severe recurrent major depression without psychotic features ([place]) Long Term Goal(s): Improvement in symptoms so as ready for discharge Improvement  in symptoms so as ready for discharge   Short Term Goals: Ability to identify changes in lifestyle to reduce recurrence of condition will improve Ability to verbalize feelings will improve Ability to disclose and discuss suicidal ideas Ability to demonstrate self-control will improve Ability to identify and develop effective coping behaviors will improve Ability to maintain clinical measurements within normal limits will improve Compliance with prescribed medications will improve Ability to identify triggers associated with substance abuse/mental health issues will improve Ability to identify changes  in lifestyle to reduce recurrence of condition will improve Ability to verbalize feelings will improve Ability to disclose and discuss suicidal ideas Ability to demonstrate self-control will improve Ability to identify and develop effective coping behaviors will improve Ability to maintain clinical measurements within normal limits will improve Compliance with prescribed medications will improve Ability to identify triggers associated with substance abuse/mental health issues will improve  Medication Management: Evaluate patient's response, side effects, and tolerance of medication regimen.  Therapeutic Interventions: 1 to 1 sessions, Unit Group sessions and Medication administration.  Evaluation of Outcomes: Progressing  Physician Treatment Plan for Secondary Diagnosis: Principal Problem:   Severe recurrent major depression without psychotic features ([place]) Active Problems:   Diabetes mellitus without complication ([place])   Cannabis use disorder, moderate, dependence ([facility])   Tobacco use disorder  Long Term Goal(s): Improvement in symptoms so as ready for discharge Improvement in symptoms so as ready for discharge   Short Term Goals: Ability to identify changes in lifestyle to reduce recurrence of condition will improve Ability to verbalize feelings will improve Ability to disclose and discuss suicidal ideas Ability to demonstrate self-control will improve Ability to identify and develop effective coping behaviors will improve Ability to maintain clinical measurements within normal limits will improve Compliance with prescribed medications will improve Ability to identify triggers associated with substance abuse/mental health issues will improve Ability to identify changes in lifestyle to reduce recurrence of condition will improve Ability to verbalize feelings will improve Ability to disclose and discuss suicidal ideas Ability to demonstrate self-control will improve Ability to  identify and develop effective coping behaviors will improve Ability to maintain clinical measurements within normal limits will improve Compliance with prescribed medications will improve Ability to identify triggers associated with substance abuse/mental health issues will improve     Medication Management: Evaluate patient's response, side effects, and tolerance of medication regimen.  Therapeutic Interventions: 1 to 1 sessions, Unit Group sessions and Medication administration.  Evaluation of Outcomes: Progressing   RN Treatment Plan for Primary Diagnosis: Severe recurrent major depression without psychotic features ([place]) Long Term Goal(s): Knowledge of disease and therapeutic regimen to maintain health will improve  Short Term Goals: Ability to verbalize feelings will improve, Ability to identify and develop effective coping behaviors will improve and Compliance with prescribed medications will improve  Medication Management: RN will administer medications as ordered by provider, will assess and evaluate patient's response and provide education to patient for prescribed medication. RN will report any adverse and/or side effects to prescribing provider.  Therapeutic Interventions: 1 on 1 counseling sessions, Psychoeducation, Medication administration, Evaluate responses to treatment, Monitor vital signs and CBGs as ordered, Perform/monitor CIWA, COWS, AIMS and Fall Risk screenings as ordered, Perform wound care treatments as ordered.  Evaluation of Outcomes: Progressing   LCSW Treatment Plan for Primary Diagnosis: Severe recurrent major depression without psychotic features ([place]) Long Term Goal(s): Safe transition to appropriate next level of care at discharge, Engage patient in therapeutic group addressing interpersonal concerns.  Short  Term Goals: Engage patient in aftercare planning with referrals and resources, Facilitate acceptance of mental health diagnosis and concerns and  Facilitate patient progression through stages of change regarding substance use diagnoses and concerns  Therapeutic Interventions: Assess for all discharge needs, 1 to 1 time with Social worker, Explore available resources and support systems, Assess for adequacy in community support network, Educate family and significant other(s) on suicide prevention, Complete Psychosocial Assessment, Interpersonal group therapy.  Evaluation of Outcomes: Progressing   Progress in Treatment: Attending groups: Yes. Participating in groups: Yes. Taking medication as prescribed: Yes. Toleration medication: Yes. Family/Significant other contact made: No, will contact:  pt's sister. Patient understands diagnosis: Yes. Discussing patient identified problems/goals with staff: Yes. Medical problems stabilized or resolved: Yes. Denies suicidal/homicidal ideation: Yes. Issues/concerns per patient self-inventory: No. Other: None at this time  New problem(s) identified: No, Describe:  none at this time.   New Short Term/Long Term Goal(s): "To feel better and be more positive."   Patient Goals:    "To feel better and be more positive."   Discharge Plan or Barriers: Pt will be discharged home and will continue tx in the outpatient setting.   Reason for Continuation of Hospitalization: Depression Medication stabilization  Estimated Length of Stay: 2-3 days   Attendees: Patient: Edgar White 02/05/2018 11:31 AM  Physician: Dr. Bary Leriche, MD 02/05/2018 11:31 AM  Nursing: Polly Cobia, RN 02/05/2018 11:31 AM  RN Care Manager: 02/05/2018 11:31 AM  Social Worker: Alden Hipp, LCSW 02/05/2018 11:31 AM  Recreational Therapist: Roanna Epley, LRT-CTRS 02/05/2018 11:31 AM  Other: Darin Engels, [place] 02/05/2018 11:31 AM  Other: Marney Doctor, [place]  02/05/2018 11:31 AM  Other: 02/05/2018 11:31 AM    Scribe for Treatment Team: Alden Hipp, LCSW 02/05/2018 11:31 AM

## 2018-02-06 LAB — GLUCOSE, CAPILLARY
GLUCOSE-CAPILLARY: 104 mg/dL — AB (ref 65–99)
GLUCOSE-CAPILLARY: 101 mg/dL — AB (ref 65–99)
Glucose-Capillary: 100 mg/dL — ABNORMAL HIGH (ref 65–99)
Glucose-Capillary: 103 mg/dL — ABNORMAL HIGH (ref 65–99)

## 2018-02-06 MED ORDER — LOPERAMIDE HCL 2 MG PO CAPS: 4.0000 mg | ORAL_CAPSULE | ORAL | Status: DC | PRN

## 2018-02-06 MED ORDER — SERTRALINE HCL 25 MG PO TABS
50.0000 mg | ORAL_TABLET | Freq: Every day | ORAL | Status: DC
  Filled 2018-02-06: qty 2

## 2018-02-06 MED ORDER — SERTRALINE HCL 100 MG PO TABS: 100.0000 mg | ORAL_TABLET | Freq: Every day | ORAL | Status: DC

## 2018-02-06 MED ORDER — SERTRALINE HCL 100 MG PO TABS
ORAL_TABLET | ORAL | Status: AC
  Filled 2018-02-06: qty 1

## 2018-02-06 MED ORDER — SERTRALINE HCL 100 MG PO TABS
100.0000 mg | ORAL_TABLET | Freq: Every day | ORAL | Status: DC
  Administered 2018-02-06: 100 mg via ORAL

## 2018-02-06 NOTE — [facility] Group Notes (Signed)
[place] Group Notes:  (Nursing/MHT/Case Management/Adjunct)  Date:  02/06/2018  Time:  9:28 PM  Type of Therapy:  Group Therapy  Participation Level:  Active  Participation Quality:  Appropriate  Affect:  Appropriate  Cognitive:  Appropriate  Insight:  Appropriate  Engagement in Group:  Engaged  Modes of Intervention:  Discussion  Summary of Progress/Problems: Edgar White shared with the group. Edgar White stated he accomplished his goal by staying positive today. Barnie Mort 02/06/2018, 9:28 PM

## 2018-02-06 NOTE — [facility] Group Notes (Signed)
02/06/2018 1PM  Type of Therapy/Topic:  Group Therapy:  Feelings about Diagnosis  Participation Level:  Active   Description of Group:   This group will allow patients to explore their thoughts and feelings about diagnoses they have received. Patients will be guided to explore their level of understanding and acceptance of these diagnoses. Facilitator will encourage patients to process their thoughts and feelings about the reactions of others to their diagnosis and will guide patients in identifying ways to discuss their diagnosis with significant others in their lives. This group will be process-oriented, with patients participating in exploration of their own experiences, giving and receiving support, and processing challenge from other group members.   Therapeutic Goals: 1. Patient will demonstrate understanding of diagnosis as evidenced by identifying two or more symptoms of the disorder 2. Patient will be able to express two feelings regarding the diagnosis 3. Patient will demonstrate their ability to communicate their needs through discussion and/or role play  Summary of Patient Progress: Actively and appropriately engaged in the group. Patient was able to provide support and validation to other group members.Patient practiced active listening when interacting with the facilitator and other group members. [name] did well in the group activity and interacted with other group members appropriately.  Patient is still in the process of obtaining treatment goals.        Therapeutic Modalities:   Cognitive Behavioral Therapy Brief Therapy Feelings Identification    Darin Engels, [place] 02/06/2018 2:10 PM

## 2018-02-06 NOTE — Progress Notes (Signed)
Patient was not given his morning meal coverage because his CBG was 104.

## 2018-02-06 NOTE — [facility] Suicide Risk Assessment (Signed)
[facility] Discharge Suicide Risk Assessment   Principal Problem: Severe recurrent major depression without psychotic features [facility]) Discharge Diagnoses:  Patient Active Problem List   Diagnosis Date Noted  . Severe recurrent major depression without psychotic features ([place]) [F33.2] 11/04/2016    Priority: High  . Cannabis use disorder, moderate, dependence ([place]) [F12.20] 11/04/2016  . Tobacco use disorder [F17.200] 11/04/2016  . Substance induced mood disorder ([place]) [F19.94] 10/13/2016  . Diabetes mellitus without complication ([place]) [H85.2] 06/08/2016  . Anxiety state [F41.1]   . Polysubstance abuse ([place]) [F19.10]   . Major depressive disorder, recurrent severe without psychotic features ([place]) [F33.2] 04/13/2016    Total Time spent with patient: 20 minutes  Musculoskeletal: Strength & Muscle Tone: within normal limits Gait & Station: normal Patient leans: N/A  Psychiatric Specialty Exam: ROS  Blood pressure (!) 91/52, pulse (!) 128, temperature (!) 97.5 F (36.4 C), temperature source Oral, resp. rate 16, height 5\' 10"  (1.778 m), weight 70.8 kg (156 lb), SpO2 98 %.Body mass index is 22.38 kg/m.  General Appearance: Casual  Eye Contact::  Good  Speech:  Clear and Coherent409  Volume:  Normal  Mood:  Euthymic  Affect:  Appropriate  Thought Process:  Goal Directed and Descriptions of Associations: Intact  Orientation:  Full (Time, Place, and Person)  Thought Content:  WDL  Suicidal Thoughts:  No  Homicidal Thoughts:  No  Memory:  Immediate;   Fair Recent;   Fair Remote;   Fair  Judgement:  Impaired  Insight:  Shallow  Psychomotor Activity:  Normal  Concentration:  Fair  Recall:  [employer] of Knowledge:Fair  Language: Fair  Akathisia:  No  Handed:  Right  AIMS (if indicated):     Assets:  Communication Skills Desire for Improvement Financial Resources/Insurance Housing Physical Health Resilience Social Support  Sleep:  Number of Hours: 6.3  Cognition: WNL  ADL's:   Intact   Mental Status Per Nursing Assessment::   On Admission:  NA  Demographic Factors:  Male, Divorced or widowed, Caucasian, Low socioeconomic status and Unemployed  Loss Factors: Decrease in vocational status and Financial problems/change in socioeconomic status  Historical Factors: Prior suicide attempts, Family history of mental illness or substance abuse and Impulsivity  Risk Reduction Factors:   Sense of responsibility to family, Living with another person, especially a relative and Positive social support  Continued Clinical Symptoms:  Depression:   Impulsivity  Cognitive Features That Contribute To Risk:  None    Suicide Risk:  Minimal: No identifiable suicidal ideation.  Patients presenting with no risk factors but with morbid ruminations; may be classified as minimal risk based on the severity of the depressive symptoms  Follow-up Information    [place] on 02/14/2018.   Why:  Please meet Sherrian Divers, peer support specialist, at [facility] on Wednesday, 02/14/18 at 7:15AM to begin peer support services. Thank you! Contact information: [place] [address] [phone]           Plan Of Care/Follow-up recommendations:  Activity:  as tolerated Diet:  low sodium heart healthy Other:  keep follow up appointments  Orson Slick, MD 02/06/2018, 12:21 PM

## 2018-02-06 NOTE — Progress Notes (Signed)
[facility] MD Progress Note  02/06/2018 11:50 AM Edgar White  MRN:  [id number]  Subjective:    Mr. Baby feels better in terms of his mood. He is no longer suicidal. He slept better with Seroquel. It is unclear, if he is able to tolerate Zoloft but wants to continue.  He however complains of diarrhea possible from metformin or zoloft. We will offer Loperamid. He still wants to be discharge tomorrow if possible. Diabetes is in good control with Metformin 500 mg, half a dose prescribed in the past. He feels much more optimistic about the future and finding a job. 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  Principal Problem: Severe recurrent major depression without psychotic features ([place]) Diagnosis:   Patient Active Problem List   Diagnosis Date Noted  . Severe recurrent major depression without psychotic features ([place]) [F33.2] 11/04/2016    Priority: High  . Cannabis use disorder, moderate, dependence ([place]) [F12.20] 11/04/2016  . Tobacco use disorder [F17.200] 11/04/2016  . Substance induced mood disorder ([place]) [F19.94] 10/13/2016  . Diabetes mellitus without complication ([place]) [F57.3] 06/08/2016  . Anxiety state [F41.1]   . Polysubstance abuse ([place]) [F19.10]   . Major depressive disorder, recurrent severe without psychotic features ([place]) [F33.2] 04/13/2016   Total Time spent with patient: 20 minutes  Past Psychiatric History: depression.  Past Medical History:  Past Medical History:  Diagnosis Date  . Depression   . Diabetes mellitus without complication [facility])     Past Surgical History:  Procedure Laterality Date  . INGUINAL HERNIA REPAIR    . LUMBAR FUSION     Family History: History reviewed. No pertinent family history. Family Psychiatric  History: none. Social History:  Social History   Substance and Sexual Activity  Alcohol Use No   Comment: denies use     Social History   Substance and Sexual Activity  Drug Use Yes  . Types:  Marijuana, Cocaine    Social History   Socioeconomic History  . Marital status: Single    Spouse name: Not on file  . Number of children: Not on file  . Years of education: Not on file  . Highest education level: Not on file  Occupational History  . Not on file  Social Needs  . Financial resource strain: Not on file  . Food insecurity:    Worry: Not on file    Inability: Not on file  . Transportation needs:    Medical: Not on file    Non-medical: Not on file  Tobacco Use  . Smoking status: Current Every Day Smoker    Packs/day: 0.50    Types: Cigarettes  . Smokeless tobacco: Never Used  Substance and Sexual Activity  . Alcohol use: No    Comment: denies use  . Drug use: Yes    Types: Marijuana, Cocaine  . Sexual activity: Never  Lifestyle  . Physical activity:    Days per week: Not on file    Minutes per session: Not on file  . Stress: Not on file  Relationships  . Social connections:    Talks on phone: Not on file    Gets together: Not on file    Attends religious service: Not on file    Active member of club or organization: Not on file    Attends meetings of clubs or organizations: Not on file    Relationship status: Not on file  Other Topics Concern  . Not on file  Social History Narrative  .  Not on file   Additional Social History:                         Sleep: Fair  Appetite:  Fair  Current Medications: Current Facility-Administered Medications  Medication Dose Route Frequency Provider Last Rate Last Dose  . acetaminophen (TYLENOL) tablet 650 mg  650 mg Oral Q6H PRN Clapacs, John T, MD      . alum & mag hydroxide-simeth (MAALOX/MYLANTA) 200-200-20 MG/5ML suspension 30 mL  30 mL Oral Q4H PRN Clapacs, John T, MD      . feeding supplement (GLUCERNA SHAKE) (GLUCERNA SHAKE) liquid 237 mL  237 mL Oral TID BM Taylr Meuth B, MD   237 mL at 02/05/18 2122  . insulin aspart (novoLOG) injection 0-9 Units  0-9 Units Subcutaneous TID WC  Clapacs, Madie [place], MD   1 Units at 02/05/18 0759  . magnesium hydroxide (MILK OF MAGNESIA) suspension 30 mL  30 mL Oral Daily PRN Clapacs, John T, MD      . metFORMIN (GLUCOPHAGE) tablet 500 mg  500 mg Oral BID WC Tacha Manni B, MD   500 mg at 02/06/18 0800  . mirtazapine (REMERON) tablet 15 mg  15 mg Oral QHS Majesty Stehlin B, MD   15 mg at 02/05/18 2120  . QUEtiapine (SEROQUEL) tablet 100 mg  100 mg Oral QHS Fendi Meinhardt B, MD   100 mg at 02/05/18 2119  . sertraline (ZOLOFT) tablet 50 mg  50 mg Oral QHS Obrian Bulson B, MD        Lab Results:  Results for orders placed or performed during the hospital encounter of 02/02/18 (from the past 48 hour(s))  Glucose, capillary     Status: Abnormal   Collection Time: 02/04/18  4:15 PM  Result Value Ref Range   Glucose-Capillary 100 (H) 65 - 99 mg/dL   Comment 1 Notify RN   Glucose, capillary     Status: None   Collection Time: 02/04/18  8:19 PM  Result Value Ref Range   Glucose-Capillary 85 65 - 99 mg/dL   Comment 1 Notify RN   Glucose, capillary     Status: Abnormal   Collection Time: 02/05/18  6:59 AM  Result Value Ref Range   Glucose-Capillary 121 (H) 65 - 99 mg/dL   Comment 1 Notify RN   Glucose, capillary     Status: None   Collection Time: 02/05/18 11:32 AM  Result Value Ref Range   Glucose-Capillary 96 65 - 99 mg/dL   Comment 1 Notify RN   Glucose, capillary     Status: Abnormal   Collection Time: 02/05/18  4:21 PM  Result Value Ref Range   Glucose-Capillary 107 (H) 65 - 99 mg/dL   Comment 1 Notify RN   Glucose, capillary     Status: Abnormal   Collection Time: 02/05/18  8:53 PM  Result Value Ref Range   Glucose-Capillary 113 (H) 65 - 99 mg/dL   Comment 1 Notify RN   Glucose, capillary     Status: Abnormal   Collection Time: 02/06/18  7:03 AM  Result Value Ref Range   Glucose-Capillary 104 (H) 65 - 99 mg/dL   Comment 1 Notify RN   Glucose, capillary     Status: Abnormal   Collection Time: 02/06/18  11:45 AM  Result Value Ref Range   Glucose-Capillary 100 (H) 65 - 99 mg/dL    Blood Alcohol level:  Lab Results  Component Value Date  ETH <10 02/02/2018   ETH <5 63/78/5885    Metabolic Disorder Labs: Lab Results  Component Value Date   HGBA1C 5.8 (H) 02/02/2018   MPG 119.76 02/02/2018   MPG 192 10/13/2016   No results found for: PROLACTIN Lab Results  Component Value Date   CHOL 235 (H) 02/02/2018   TRIG 146 02/02/2018   HDL 35 (L) 02/02/2018   CHOLHDL 6.7 02/02/2018   VLDL 29 02/02/2018   LDLCALC 171 (H) 02/02/2018   LDLCALC 153 (H) 11/05/2016    Physical Findings: AIMS:  , ,  ,  ,    CIWA:    COWS:     Musculoskeletal: Strength & Muscle Tone: within normal limits Gait & Station: normal Patient leans: N/A  Psychiatric Specialty Exam: Physical Exam  Nursing note and vitals reviewed. Psychiatric: His speech is normal and behavior is normal. Judgment and thought content normal. His mood appears anxious. Cognition and memory are normal. He exhibits a depressed mood.    Review of Systems  Gastrointestinal: Positive for diarrhea.  All other systems reviewed and are negative.   Blood pressure (!) 91/52, pulse (!) 128, temperature (!) 97.5 F (36.4 C), temperature source Oral, resp. rate 16, height 5\' 10"  (1.778 m), weight 70.8 kg (156 lb), SpO2 98 %.Body mass index is 22.38 kg/m.  General Appearance: Casual  Eye Contact:  Good  Speech:  Clear and Coherent  Volume:  Normal  Mood:  Euthymic  Affect:  Flat  Thought Process:  Goal Directed and Descriptions of Associations: Intact  Orientation:  Full (Time, Place, and Person)  Thought Content:  WDL  Suicidal Thoughts:  No  Homicidal Thoughts:  No  Memory:  Immediate;   Fair Recent;   Fair Remote;   Fair  Judgement:  Impaired  Insight:  Shallow  Psychomotor Activity:  Decreased  Concentration:  Concentration: Fair and Attention Span: Fair  Recall:  [employer] of Knowledge:  Fair  Language:  Fair   Akathisia:  No  Handed:  Right  AIMS (if indicated):     Assets:  Communication Skills Desire for Improvement Housing Physical Health Resilience Social Support  ADL's:  Intact  Cognition:  WNL  Sleep:  Number of Hours: 6.3     Treatment Plan Summary: Daily contact with patient to assess and evaluate symptoms and progress in treatment and Medication management   Mr. Cho is a 52 year old male with a history of dsepression and treatment noncompliance admitted for worsening depression and suicidal ideation.  #Suicidal ideation, resolved  #Mood -continue Zoloft to 100 mg nightly  -discontinue Remeron 15 mg nightly -continue Seroquel 100 mg nightlytly  #Diabetes, BS adequately controlled -continue Metformin 500 mg BID due to diarrhea -SSI, ADA diet, CBG  #Diarhea -Loperamid PRN  #Dyslipidemia -declines Lopid  #Cannabis abuse -minimizes problems and declines treatment  #Smoking cessation -nicotine patch is available  #Labs -lipid panel shows elevate Chol and TG, TSH and A1C normal -EKG reviewed, NSR with QTc 440  #Disposition -discharge with the brother -follow up with [facility]     Orson Slick, MD 02/06/2018, 11:50 AM

## 2018-02-06 NOTE — [facility] Group Notes (Signed)
CSW Group Therapy Note  02/06/2018  Time:  0900  Type of Therapy and Topic: Group Therapy: Goals Group: SMART Goals    Participation Level:  Did Not Attend    Description of Group:   The purpose of a daily goals group is to assist and guide patients in setting recovery/wellness-related goals. The objective is to set goals as they relate to the crisis in which they were admitted. Patients will be using SMART goal modalities to set measurable goals. Characteristics of realistic goals will be discussed and patients will be assisted in setting and processing how one will reach their goal. Facilitator will also assist patients in applying interventions and coping skills learned in psycho-education groups to the SMART goal and process how one will achieve defined goal.    Therapeutic Goals:  -Patients will develop and document one goal related to or their crisis in which brought them into treatment.  -Patients will be guided by LCSW using SMART goal setting modality in how to set a measurable, attainable, realistic and time sensitive goal.  -Patients will process barriers in reaching goal.  -Patients will process interventions in how to overcome and successful in reaching goal.    Patient's Goal:   Pt was invited to attend group but chose not to attend. CSW will continue to encourage pt to attend group throughout their admission.   Therapeutic Modalities:  Motivational Interviewing  Cognitive Behavioral Therapy  Crisis Intervention Model  SMART goals setting  Alden Hipp, MSW, LCSW Clinical Social Worker 02/06/2018 9:36 AM

## 2018-02-06 NOTE — Discharge Summary (Signed)
Physician Discharge Summary Note  Patient:  Edgar White is an 52 y.o., male MRN:  [id number] DOB:  10-20-1965 Patient phone:  [phone] (home)  Patient address:   [place] [address],  Total Time spent with patient: 20 minutes plus 15 min on care coordination and documantation  Date of Admission:  02/02/2018 Date of Discharge: 02/07/2018  Reason for Admission:  Depression.  History of Present Illness:   "I'm depressed"  52 year old man with a history of  Depression admitted for worsening depression and suicidal thoughts and wishes that he were dead. Also voiced plan to OD . pt reports depression getting worse for last 6 months and even worse for several weeks. 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 He has not been eating well and has lost  weight. Feels nervous a lot. Sleep pattern is irregular. He is not taking any of his psychiatric medicine including not taking his diabetes medicine. Reports paranoia- "people talking about me", denies AVH. Denies SI at this time,   Associated Signs/Symptoms: Depression Symptoms:  depressed mood, fatigue, difficulty concentrating, hopelessness, (Hypo) Manic Symptoms:   Anxiety Symptoms:  Excessive Worry, Psychotic Symptoms:  Paranoia, PTSD Symptoms: Negative  Past Psychiatric History: depression since 2003, multiple psych hospitalization, last in March 2018, Op at [facility]. Several prior serious suicide attempts. Has had ECT as well as antidepressants. Does reasonably well when he is on his medicine. Currently he is going to [place] and is taking a combination of Celexa and Latuda and thinks it is making him feel worse. Was not taking effexor for about 1 yr, was just restarted, wants to try sth new.   Family Psychiatric  History: multiple family members with substance abuse and depression.  Social History: living with his brother.  Principal Problem: Severe  recurrent major depression without psychotic features [facility]) Discharge Diagnoses: Patient Active Problem List   Diagnosis Date Noted  . Severe recurrent major depression without psychotic features ([place]) [F33.2] 11/04/2016    Priority: High  . Cannabis use disorder, moderate, dependence ([place]) [F12.20] 11/04/2016  . Tobacco use disorder [F17.200] 11/04/2016  . Substance induced mood disorder ([place]) [F19.94] 10/13/2016  . Diabetes mellitus without complication ([place]) [G86.7] 06/08/2016  . Anxiety state [F41.1]   . Polysubstance abuse ([place]) [F19.10]   . Major depressive disorder, recurrent severe without psychotic features ([place]) [F33.2] 04/13/2016    Past Medical History:  Past Medical History:  Diagnosis Date  . Depression   . Diabetes mellitus without complication [facility])     Past Surgical History:  Procedure Laterality Date  . INGUINAL HERNIA REPAIR    . LUMBAR FUSION     Family History: History reviewed. No pertinent family history.  Social History:  Social History   Substance and Sexual Activity  Alcohol Use No   Comment: denies use     Social History   Substance and Sexual Activity  Drug Use Yes  . Types: Marijuana, Cocaine    Social History   Socioeconomic History  . Marital status: Single    Spouse name: Not on file  . Number of children: Not on file  . Years of education: Not on file  . Highest education level: Not on file  Occupational History  . Not on file  Social Needs  . Financial resource strain: Not on file  . Food insecurity:    Worry: Not on file    Inability: Not on  file  . Transportation needs:    Medical: Not on file    Non-medical: Not on file  Tobacco Use  . Smoking status: Current Every Day Smoker    Packs/day: 0.50    Types: Cigarettes  . Smokeless tobacco: Never Used  Substance and Sexual Activity  . Alcohol use: No    Comment: denies use  . Drug use: Yes    Types: Marijuana, Cocaine  . Sexual activity: Never  Lifestyle  .  Physical activity:    Days per week: Not on file    Minutes per session: Not on file  . Stress: Not on file  Relationships  . Social connections:    Talks on phone: Not on file    Gets together: Not on file    Attends religious service: Not on file    Active member of club or organization: Not on file    Attends meetings of clubs or organizations: Not on file    Relationship status: Not on file  Other Topics Concern  . Not on file  Social History Narrative  . Not on file    Hospital Course:    Edgar White is a 52 year old male with a history of dsepression and treatment noncompliance admitted for worsening depression and passive suicidal ideation. He tolerated well medication adjustment. At the time of discharge, he is no longer suicidal or homicidal. He is able to contract for safety. He is forward thinking and optimistic about the future.  #Mood, improved -continue Zoloft to 50 mg nightly -continue Seroquel 100 mg nightly  #Diabetes, BS adequately controlled -continue Metformin 500 mg BID  #Diarhea -Loperamid PRN  #Dyslipidemia -declines Lopid  #Cannabis abuse -minimizes problems and declines treatment  #Smoking cessation -nicotine patch is available  #Labs -lipid panel shows elevate Chol and TG, TSH and A1C normal -EKG reviewed, NSR with QTc 440  #Disposition -discharge to home withthebrother -follow up with [facility]    Physical Findings: AIMS:  , ,  ,  ,    CIWA:    COWS:     Musculoskeletal: Strength & Muscle Tone: within normal limits Gait & Station: normal Patient leans: N/A  Psychiatric Specialty Exam: Physical Exam  Nursing note and vitals reviewed. Psychiatric: He has a normal mood and affect. His speech is normal and behavior is normal. Judgment and thought content normal. Cognition and memory are normal.    Review of Systems  Neurological: Negative.   Psychiatric/Behavioral: Positive for substance abuse.  All other systems reviewed  and are negative.   Blood pressure 112/83, pulse (!) 112, temperature 97.9 F (36.6 C), temperature source Oral, resp. rate 16, height 5\' 10"  (1.778 m), weight 70.8 kg (156 lb), SpO2 98 %.Body mass index is 22.38 kg/m.  General Appearance: Casual  Eye Contact:  Good  Speech:  Clear and Coherent  Volume:  Normal  Mood:  Euthymic  Affect:  Appropriate  Thought Process:  Goal Directed and Descriptions of Associations: Intact  Orientation:  Full (Time, Place, and Person)  Thought Content:  WDL  Suicidal Thoughts:  No  Homicidal Thoughts:  No  Memory:  Immediate;   Fair Recent;   Fair Remote;   Fair  Judgement:  Impaired  Insight:  Shallow  Psychomotor Activity:  Normal  Concentration:  Concentration: Fair and Attention Span: Fair  Recall:  [employer] of Knowledge:  Fair  Language:  Fair  Akathisia:  No  Handed:  Right  AIMS (if indicated):     Assets:  Communication Skills Desire for Improvement Housing Physical Health Resilience Social Support  ADL's:  Intact  Cognition:  WNL  Sleep:  Number of Hours: 6.3     Have you used any form of tobacco in the last 30 days? (Cigarettes, Smokeless Tobacco, Cigars, and/or Pipes): Yes  Has this patient used any form of tobacco in the last 30 days? (Cigarettes, Smokeless Tobacco, Cigars, and/or Pipes) Yes, Yes, A prescription for an FDA-approved tobacco cessation medication was offered at discharge and the patient refused  Blood Alcohol level:  Lab Results  Component Value Date   [facility] <10 02/02/2018   ETH <5 31/49/7026    Metabolic Disorder Labs:  Lab Results  Component Value Date   HGBA1C 5.8 (H) 02/02/2018   MPG 119.76 02/02/2018   MPG 192 10/13/2016   No results found for: PROLACTIN Lab Results  Component Value Date   CHOL 235 (H) 02/02/2018   TRIG 146 02/02/2018   HDL 35 (L) 02/02/2018   CHOLHDL 6.7 02/02/2018   VLDL 29 02/02/2018   LDLCALC 171 (H) 02/02/2018   LDLCALC 153 (H) 11/05/2016    See Psychiatric  Specialty Exam and Suicide Risk Assessment completed by Attending Physician prior to discharge.  Discharge destination:  Home  Is patient on multiple antipsychotic therapies at discharge:  No   Has Patient had three or more failed trials of antipsychotic monotherapy by history:  No  Recommended Plan for Multiple Antipsychotic Therapies: NA  Discharge Instructions    Diet - low sodium heart healthy   Complete by:  As directed    Increase activity slowly   Complete by:  As directed      Allergies as of 02/07/2018   No Known Allergies     Medication List    STOP taking these medications   benzocaine 10 % mucosal gel Commonly known as:  ORAJEL   desvenlafaxine 50 MG 24 hr tablet Commonly known as:  PRISTIQ   gemfibrozil 600 MG tablet Commonly known as:  LOPID   hydrOXYzine 50 MG tablet Commonly known as:  ATARAX/VISTARIL   ibuprofen 800 MG tablet Commonly known as:  ADVIL,MOTRIN   penicillin v potassium 500 MG tablet Commonly known as:  VEETID   traZODone 100 MG tablet Commonly known as:  DESYREL     TAKE these medications     Indication  metFORMIN 500 MG tablet Commonly known as:  GLUCOPHAGE Take 1 tablet (500 mg total) by mouth 2 (two) times daily with a meal. What changed:    medication strength  how much to take  Indication:  Type 2 Diabetes   QUEtiapine 100 MG tablet Commonly known as:  SEROQUEL Take 1 tablet (100 mg total) by mouth at bedtime.  Indication:  Major Depressive Disorder   sertraline 100 MG tablet Commonly known as:  ZOLOFT Take 1 tablet (100 mg total) by mouth at bedtime.  Indication:  Major Depressive Disorder      Follow-up Information    [facility] on 02/14/2018.   Why:  Please meet Sherrian Divers, peer support specialist, at [facility] on Wednesday, 02/14/18 at 7:15AM to begin peer support services. Thank you! Contact information: [place] [address] [phone]           Follow-up  recommendations:  Activity:  as tolerated Diet:  low sodium heart healthy Other:  keep follow up appoitments  Comments:    Signed: Orson Slick, MD 02/07/2018, 8:51 AM

## 2018-02-06 NOTE — Progress Notes (Signed)
D- Patient alert and oriented. Patient presents in a pleasant mood on assessment stating that he slept "a little better" last night. Patient rates his depression and anxiety a "3/10" and states to this writer that he feels this way because of "being in here". Patient denies SI, HI, AVH, and pain at this time. Patient's goal for today is to "be positive".  A- Scheduled medications administered to patient, per MD orders. Support and encouragement provided.  Routine safety checks conducted every 15 minutes.  Patient informed to notify staff with problems or concerns.  R- No adverse drug reactions noted. Patient contracts for safety at this time. Patient compliant with medications and treatment plan. Patient receptive, calm, and cooperative. Patient interacts well with others on the unit.  Patient remains safe at this time.

## 2018-02-06 NOTE — Plan of Care (Signed)
Patient has the ability to improve thought processes and verbalizes understanding of his prescribed therapeutic regimen and has not voiced any further questions or concerns at this time. Patient denies SI/HI/AVH, but rates his depression and anxiety levels a "3/10" because of "being in here. Patient has the ability to manage his health-related needs and has been working towards maintaining his clinical measurements within normal limits. Patient has remained free from infection and he has maintained adequate nutrition. Patient's risk for activity intolerance as well as impaired skin integrity has decreased. Patient's overall level of comfort has improved. Patient has remained free from injury and is safe on the unit at this time.  Problem: Education: Goal: Utilization of techniques to improve thought processes will improve Outcome: Progressing Goal: Knowledge of the prescribed therapeutic regimen will improve Outcome: Progressing   Problem: Coping: Goal: Coping ability will improve Outcome: Progressing Goal: Will verbalize feelings Outcome: Progressing   Problem: Education: Goal: Knowledge of General Education information will improve Outcome: Progressing   Problem: Health Behavior/Discharge Planning: Goal: Ability to manage health-related needs will improve Outcome: Progressing   Problem: Clinical Measurements: Goal: Ability to maintain clinical measurements within normal limits will improve Outcome: Progressing Goal: Will remain free from infection Outcome: Progressing   Problem: Activity: Goal: Risk for activity intolerance will decrease Outcome: Progressing   Problem: Nutrition: Goal: Adequate nutrition will be maintained Outcome: Progressing   Problem: Coping: Goal: Level of anxiety will decrease Outcome: Progressing   Problem: Pain Managment: Goal: General experience of comfort will improve Outcome: Progressing   Problem: Safety: Goal: Ability to remain free from  injury will improve Outcome: Progressing   Problem: Skin Integrity: Goal: Risk for impaired skin integrity will decrease Outcome: Progressing

## 2018-02-06 NOTE — Plan of Care (Signed)
Pt out in milieu this evening interacting with his peers. Pt denies SI/HI. Pt compliant with medications. Pt is receptive to treatment and safety maintained on unit. Will continue to monitor. Problem: Education: Goal: Utilization of techniques to improve thought processes will improve Outcome: Progressing Goal: Knowledge of the prescribed therapeutic regimen will improve Outcome: Progressing   Problem: Coping: Goal: Coping ability will improve Outcome: Progressing Goal: Will verbalize feelings Outcome: Progressing   Problem: Clinical Measurements: Goal: Ability to maintain clinical measurements within normal limits will improve Outcome: Progressing Goal: Will remain free from infection Outcome: Progressing   Problem: Activity: Goal: Risk for activity intolerance will decrease Outcome: Progressing

## 2018-02-07 LAB — GLUCOSE, CAPILLARY: Glucose-Capillary: 105 mg/dL — ABNORMAL HIGH (ref 65–99)

## 2018-02-07 NOTE — Progress Notes (Signed)
  [facility] Adult Case Management Discharge Plan :  Will you be returning to the same living situation after discharge:  Yes,  Home with family At discharge, do you have transportation home?: Yes,  Brother coming at discharge Do you have the ability to pay for your medications: Yes,  Referred to a provider who can assist  Release of information consent forms completed and in the chart;  Patient's signature needed at discharge.  Patient to Follow up at: Follow-up Information    [place] on 02/14/2018.   Why:  Please meet Sherrian Divers, peer support specialist, at [facility] on Wednesday, 02/14/18 at 7:15AM to begin peer support services. Thank you! Contact information: [place] [phone]           Next level of care provider has access to [place] and Suicide Prevention discussed: Yes,  Completed with patient  Have you used any form of tobacco in the last 30 days? (Cigarettes, Smokeless Tobacco, Cigars, and/or Pipes): Yes  Has patient been referred to the Quitline?: Patient refused referral  Patient has been referred for addiction treatment: Yes  Darin Engels, LCSW 02/07/2018, 9:43 AM

## 2018-02-07 NOTE — BHH Suicide Risk Assessment (Signed)
[place] INPATIENT:  Family/Significant Other Suicide Prevention Education  Suicide Prevention Education:  Contact Attempts: Santiago Glad Crespo/ sister/ [phone] has been identified by the patient as the family member/significant other with whom the patient will be residing, and identified as the person(s) who will aid the patient in the event of a mental health crisis.  With written consent from the patient, two attempts were made to provide suicide prevention education, prior to and/or following the patient's discharge.  We were unsuccessful in providing suicide prevention education.  A suicide education pamphlet was given to the patient to share with family/significant other.  Date and time of first attempt: CSW attempted to contact the patients family member/ significant other to complete SPE and obtain collateral information. CSW left a voicemail with a callback number on 02/06/2018 at 11am.  Date and time of second attempt: CSW attempted to contact the patients family member/ significant other to complete SPE and obtain collateral information. CSW left a voicemail with a callback number on 02/07/2018 9:41 AM   Darin Engels 02/07/2018, 9:41 AM

## 2018-02-07 NOTE — Progress Notes (Signed)
Patient ID: Edgar White, male   DOB: 07-Oct-1965, 52 y.o.   MRN: [id number]  Discharge Note:  Patient denies SI/HI/AVH at this time. Discharge instructions, AVS, and transition record gone over with patient. Patient agrees to comply with medication management, follow-up visit, and outpatient therapy. Patient belongings returned to patient. Patient questions and concerns addressed and answered. Patient ambulatory off unit. Patient discharged to home with sister.

## 2018-02-07 NOTE — Progress Notes (Signed)
Recreation Therapy Notes  Date: 06/19//2019  Time: 9:30 am   Location: Craft Room   Behavioral response: N/A   Intervention Topic: Team Work  Discussion/Intervention: Patient did not attend group.   Clinical Observations/Feedback:  Patient did not attend group.   Davianna Deutschman LRT/CTRS        Hien Cunliffe 02/07/2018 11:18 AM

## 2018-02-07 NOTE — Progress Notes (Signed)
Recreation Therapy Notes  INPATIENT RECREATION TR PLAN  Patient Details Name: Edgar White MRN: [id number] DOB: 01-09-1966 Today's Date: 02/07/2018  Rec Therapy Plan Is patient appropriate for Therapeutic Recreation?: Yes Treatment times per week: at least 3 Estimated Length of Stay: 5-7 days TR Treatment/Interventions: Group participation (Comment)  Discharge Criteria Pt will be discharged from therapy if:: Discharged Treatment plan/goals/alternatives discussed and agreed upon by:: Patient/family  Discharge Summary Short term goals set:  Patient will identify 3 positive coping skills strategies to use for depression post d/c within 5 recreation therapy group sessions Short term goals met: Adequate for discharge Progress toward goals comments: Groups attended Which groups?: Other (Comment)(Happiness) Reason goals not met: N/A Therapeutic equipment acquired: N/A Reason patient discharged from therapy: Discharge from hospital Pt/family agrees with progress & goals achieved: Yes Date patient discharged from therapy: 02/07/18   Leighann Amadon 02/07/2018, 3:19 PM

## 2018-02-07 NOTE — BHH Suicide Risk Assessment (Signed)
[place] INPATIENT:  Family/Significant Other Suicide Prevention Education  Suicide Prevention Education:  Education Completed; Santiago Glad Crespo/ sister/ [phone] has been identified by the patient as the family member/significant other with whom the patient will be residing, and identified as the person(s) who will aid the patient in the event of a mental health crisis (suicidal ideations/suicide attempt).  With written consent from the patient, the family member/significant other has been provided the following suicide prevention education, prior to the and/or following the discharge of the patient.  The suicide prevention education provided includes the following:  Suicide risk factors  Suicide prevention and interventions  National Suicide Hotline telephone number  [facility] assessment telephone number  [facility] Emergency Assistance [place] and/or Residential Mobile Crisis Unit telephone number  Request made of family/significant other to:  Remove weapons (e.g., guns, rifles, knives), all items previously/currently identified as safety concern.    Remove drugs/medications (over-the-counter, prescriptions, illicit drugs), all items previously/currently identified as a safety concern.  The family member/significant other verbalizes understanding of the suicide prevention education information provided.  The family member/significant other agrees to remove the items of safety concern listed above.  CSW spoke with the patient sister about concerns and questions. She reports that the patient has been dealing with his for a few years now and had questions reguarding his medications. She also asked for information on how the patient can apply for disability and medicaid. CSW answered all questions and addressed her concerns. No other concerns at the time.  Darin Engels 02/07/2018, 9:57 AM

## 2018-03-01 ENCOUNTER — Telehealth: Payer: Self-pay | Admitting: Pharmacy Technician

## 2018-03-01 NOTE — Telephone Encounter (Signed)
Provided updated [facility] application, DOH Attestation and 4506-T.  Still needs to provide Letter of Support.  Patient understands that no additional medication assistance will be provided until Letter of Support is provided.  [place] [facility]

## 2018-07-30 ENCOUNTER — Encounter (INDEPENDENT_AMBULATORY_CARE_PROVIDER_SITE_OTHER): Payer: Self-pay

## 2018-07-30 ENCOUNTER — Ambulatory Visit: Payer: Self-pay | Admitting: Pharmacy Technician

## 2018-07-30 ENCOUNTER — Telehealth: Payer: Self-pay | Admitting: Pharmacy Technician

## 2018-07-30 DIAGNOSIS — Z79899 Other long term (current) drug therapy: Secondary | ICD-10-CM

## 2018-07-30 NOTE — Telephone Encounter (Signed)
Patient provided poi.  Approved to receive medication assistance at [facility] as long as eligibility criteria continues to be met.  [place] [facility]

## 2018-07-30 NOTE — Progress Notes (Signed)
Completed [facility] application and contract.  Patient agreed to all terms of the [facility] contract.    Patient to provide last 30 days of paystubs.   Provided patient with Civil engineer, contracting based on his particular needs.    [employer] Prescription Application completed with patient.  Forwarded to Dr. Miles Costain for signature.  Upon receipt of signed application from provider and proof of income from patient, [employer] Prescription Application will be submitted to [place].  Referred patient to [place] because patient is a resident of [place].    [place] [facility]

## 2018-10-24 ENCOUNTER — Telehealth: Payer: Self-pay | Admitting: Pharmacy Technician

## 2018-10-24 NOTE — Telephone Encounter (Signed)
Received 2020 proof of income.  Patient eligible to receive medication assistance [facility] as long as eligibility requirements continue to be met.  [place] [facility]

## 2019-10-30 ENCOUNTER — Telehealth: Payer: Self-pay | Admitting: Pharmacy Technician

## 2019-10-30 NOTE — Telephone Encounter (Signed)
Patient has Medicaid with prescription coverage.  Patient no longer meets [facility]'s eligibility criteria.  Patient notified.  [place] [facility]

## 2021-06-17 ENCOUNTER — Encounter: Payer: Self-pay | Admitting: Emergency Medicine

## 2021-06-17 ENCOUNTER — Emergency Department (EMERGENCY_DEPARTMENT_HOSPITAL)
Admission: EM | Admit: 2021-06-17 | Discharge: 2021-06-18 | Disposition: A | Discharge status: Other Acute Inpatient Hospital | Payer: No Typology Code available for payment source | Source: Home / Self Care | Attending: Emergency Medicine | Admitting: Emergency Medicine

## 2021-06-17 ENCOUNTER — Other Ambulatory Visit: Payer: Self-pay

## 2021-06-17 DIAGNOSIS — Z7984 Long term (current) use of oral hypoglycemic drugs: Secondary | ICD-10-CM | POA: Insufficient documentation

## 2021-06-17 DIAGNOSIS — F17210 Nicotine dependence, cigarettes, uncomplicated: Secondary | ICD-10-CM | POA: Insufficient documentation

## 2021-06-17 DIAGNOSIS — Z20822 Contact with and (suspected) exposure to covid-19: Secondary | ICD-10-CM | POA: Insufficient documentation

## 2021-06-17 DIAGNOSIS — F332 Major depressive disorder, recurrent severe without psychotic features: Secondary | ICD-10-CM | POA: Diagnosis present

## 2021-06-17 DIAGNOSIS — R45851 Suicidal ideations: Secondary | ICD-10-CM | POA: Insufficient documentation

## 2021-06-17 DIAGNOSIS — E119 Type 2 diabetes mellitus without complications: Secondary | ICD-10-CM | POA: Insufficient documentation

## 2021-06-17 LAB — CBC
HCT: 44.5 % (ref 39.0–52.0)
Hemoglobin: 16.4 g/dL (ref 13.0–17.0)
MCH: 32.2 pg (ref 26.0–34.0)
MCHC: 36.9 g/dL — ABNORMAL HIGH (ref 30.0–36.0)
MCV: 87.3 fL (ref 80.0–100.0)
Platelets: 209 10*3/uL (ref 150–400)
RBC: 5.1 MIL/uL (ref 4.22–5.81)
RDW: 11.8 % (ref 11.5–15.5)
WBC: 7.6 10*3/uL (ref 4.0–10.5)
nRBC: 0 % (ref 0.0–0.2)

## 2021-06-17 LAB — URINE DRUG SCREEN, QUALITATIVE (ARMC ONLY)
Amphetamines, Ur Screen: NOT DETECTED
Barbiturates, Ur Screen: NOT DETECTED
Benzodiazepine, Ur Scrn: NOT DETECTED
Cannabinoid 50 Ng, Ur ~~LOC~~: POSITIVE — AB
Cocaine Metabolite,Ur ~~LOC~~: NOT DETECTED
MDMA (Ecstasy)Ur Screen: NOT DETECTED
Methadone Scn, Ur: NOT DETECTED
Opiate, Ur Screen: NOT DETECTED
Phencyclidine (PCP) Ur S: NOT DETECTED
Tricyclic, Ur Screen: NOT DETECTED

## 2021-06-17 LAB — COMPREHENSIVE METABOLIC PANEL
ALT: 14 U/L (ref 0–44)
AST: 19 U/L (ref 15–41)
Albumin: 4.2 g/dL (ref 3.5–5.0)
Alkaline Phosphatase: 60 U/L (ref 38–126)
Anion gap: 9 (ref 5–15)
BUN: 21 mg/dL — ABNORMAL HIGH (ref 6–20)
CO2: 25 mmol/L (ref 22–32)
Calcium: 9.3 mg/dL (ref 8.9–10.3)
Chloride: 100 mmol/L (ref 98–111)
Creatinine, Ser: 1.11 mg/dL (ref 0.61–1.24)
GFR, Estimated: >60 mL/min (ref >60)
Glucose, Bld: 180 mg/dL — ABNORMAL HIGH (ref 70–99)
Potassium: 4.4 mmol/L (ref 3.5–5.1)
Sodium: 134 mmol/L — ABNORMAL LOW (ref 135–145)
Total Bilirubin: 0.9 mg/dL (ref 0.3–1.2)
Total Protein: 7.1 g/dL (ref 6.5–8.1)

## 2021-06-17 LAB — RESP PANEL BY RT-PCR (FLU A&B, COVID) ARPGX2
Influenza A by PCR: NEGATIVE
Influenza B by PCR: NEGATIVE
SARS Coronavirus 2 by RT PCR: NEGATIVE

## 2021-06-17 LAB — ETHANOL: Alcohol, Ethyl (B): <10 mg/dL (ref <10)

## 2021-06-17 LAB — SALICYLATE LEVEL: Salicylate Lvl: <7 mg/dL — ABNORMAL LOW (ref 7.0–30.0)

## 2021-06-17 LAB — ACETAMINOPHEN LEVEL: Acetaminophen (Tylenol), Serum: <10 ug/mL — ABNORMAL LOW (ref 10–30)

## 2021-06-17 NOTE — ED Notes (Signed)
PT  MOVED TO  [facility]  /  PENDING  CONSULT

## 2021-06-17 NOTE — ED Notes (Signed)
Hourly rounding reveals patient in room. No complaints, stable, in no acute distress. Q15 minute rounds and monitoring via [employer] Cam[employer] to continue. 

## 2021-06-17 NOTE — ED Notes (Signed)
EDP [place] at bedside.  

## 2021-06-17 NOTE — ED Provider Notes (Signed)
Emergency Medicine Provider Triage Evaluation Note  Edgar White , a 55 y.o. male  was evaluated in triage.  Pt complains of depression and SI for several weeks. No specific plan. "If I had to it would be overdose." Denies overdose attempt.  Review of Systems  Positive: No appetite Negative: Headache, nausea, vomiting  Physical Exam  There were no vitals taken for this visit. Gen:   Awake, no distress   Resp:  Normal effort  MSK:   Moves extremities without difficulty  Other:    Medical Decision Making  Medically screening exam initiated at 1:29 PM.  Appropriate orders placed.  Edgar White was informed that the remainder of the evaluation will be completed by another provider, this initial triage assessment does not replace that evaluation, and the importance of remaining in the ED until their evaluation is complete.   Edgar Dike, FNP 06/17/21 1332    Edgar Dark, MD 06/17/21 1732

## 2021-06-17 NOTE — ED Notes (Signed)
Report off to [facility] rn b[facility] nurse

## 2021-06-17 NOTE — ED Notes (Signed)
Report to include Situation, Background, Assessment, and Recommendations received from [facility]. Patient alert and oriented, warm and dry, in no acute distress. Patient denies SI, HI, AVH and pain. Patient made aware of Q15 minute rounds and security cameras for their safety. Patient instructed to come to me with needs or concerns.

## 2021-06-17 NOTE — ED Notes (Signed)
Snack and beverage given. 

## 2021-06-17 NOTE — ED Notes (Signed)
VOLUNTARY/AWAITING TTS/PSYCH CONSULT

## 2021-06-17 NOTE — ED Notes (Signed)
Pt calm and cooperative. Pt reports history of depression; reports thoughts and intentions to harm self; SI. Reports no specific plan other than to potentially OD. Denies any recent major life changes. Denies HA, CP, SOB. Reports has been able to take his home psych medications regularly.

## 2021-06-17 NOTE — Consult Note (Signed)
[facility] Consult   Reason for Consult:  depression Referring Physician:  Jari Pigg Patient Identification: Edgar White MRN:  [id number] Principal Diagnosis: Major depressive disorder, recurrent severe without psychotic features ([place]) Diagnosis:  Principal Problem:   Major depressive disorder, recurrent severe without psychotic features ([place])   Total Time spent with patient: 1 hour  Subjective:  "I saw my doctor today and he told me to come here." Edgar White is a 55 y.o. male patient admitted with suicidal thoughts, worsening depression.  HPI: Patient seen and chart reviewed.  Patient is very tearful.  He states that he has had a long history of depression with several hospitalizations and ECT, which he feels did not work very well.  Patient describes that it has gotten particularly worse over the last year when he had all his teeth pulled and got dentures.  He said since that time he does not want to talk to people and the dentures making him feel self-conscious.  He does not want to eat.  He does not like to cook but he finds no enjoyment in that anymore.  He states he has lost a lot of weight.  He has had several losses in the last few years including his mother, father, and his dog.  He states that he is currently getting care at [facility] and is on Zyprexa and Pristiq.  He feels that he needs medication adjustment.  He is on disability due to his depression.  He does not feel that he has any joy in his life but is hopeful that he will get better.  Patient denies alcohol use.  States that he smoked marijuana several weeks ago but that did not help.  Denies other illicit drugs. Patient meets criteria for inpatient psychiatric admission and desires this.  Past Psychiatric History: MDD, Anxiety, Several hospitalizations for depression  Risk to Self:   Risk to Others:   Prior Inpatient Therapy:   Prior Outpatient Therapy:    Past Medical History:  Past Medical History:   Diagnosis Date   Depression    Diabetes mellitus without complication ([place])     Past Surgical History:  Procedure Laterality Date   INGUINAL HERNIA REPAIR     LUMBAR FUSION     Family History: No family history on file. Family Psychiatric  History: unknown Social History:  Social History   Substance and Sexual Activity  Alcohol Use No   Comment: denies use     Social History   Substance and Sexual Activity  Drug Use Yes   Types: Marijuana, Cocaine    Social History   Socioeconomic History   Marital status: Single    Spouse name: Not on file   Number of children: Not on file   Years of education: Not on file   Highest education level: Not on file  Occupational History   Not on file  Tobacco Use   Smoking status: Every Day    Packs/day: 0.50    Types: Cigarettes   Smokeless tobacco: Never  Substance and Sexual Activity   Alcohol use: No    Comment: denies use   Drug use: Yes    Types: Marijuana, Cocaine   Sexual activity: Never  Other Topics Concern   Not on file  Social History Narrative   Not on file   Social Determinants of Health   Financial Resource Strain: Not on file  Food Insecurity: Not on file  Transportation Needs: Not on file  Physical Activity: Not on  file  Stress: Not on file  Social Connections: Not on file   Additional Social History:    Allergies:  No Known Allergies  Labs:  Results for orders placed or performed during the hospital encounter of 06/17/21 (from the past 48 hour(s))  Comprehensive metabolic panel     Status: Abnormal   Collection Time: 06/17/21  2:26 PM  Result Value Ref Range   Sodium 134 (L) 135 - 145 mmol/L   Potassium 4.4 3.5 - 5.1 mmol/L   Chloride 100 98 - 111 mmol/L   CO2 25 22 - 32 mmol/L   Glucose, Bld 180 (H) 70 - 99 mg/dL    Comment: Glucose reference range applies only to samples taken after fasting for at least 8 hours.   BUN 21 (H) 6 - 20 mg/dL   Creatinine, Ser 1.11 0.61 - 1.24 mg/dL   Calcium  9.3 8.9 - 10.3 mg/dL   Total Protein 7.1 6.5 - 8.1 g/dL   Albumin 4.2 3.5 - 5.0 g/dL   AST 19 15 - 41 U/L   ALT 14 0 - 44 U/L   Alkaline Phosphatase 60 38 - 126 U/L   Total Bilirubin 0.9 0.3 - 1.2 mg/dL   GFR, Estimated >60 >60 mL/min    Comment: (NOTE) Calculated using the CKD-EPI Creatinine Equation (2021)    Anion gap 9 5 - 15    Comment: Performed at [facility], [address] [place], [place] [address]  Ethanol     Status: None   Collection Time: 06/17/21  2:26 PM  Result Value Ref Range   Alcohol, Ethyl (B) <10 <10 mg/dL    Comment: (NOTE) Lowest detectable limit for serum alcohol is 10 mg/dL.  For medical purposes only. Performed at [facility], [address] [place], [place] [address]   Salicylate level     Status: Abnormal   Collection Time: 06/17/21  2:26 PM  Result Value Ref Range   Salicylate Lvl <7.7 (L) 7.0 - 30.0 mg/dL    Comment: Performed at [facility], [address] [place], [place] [address]  Acetaminophen level     Status: Abnormal   Collection Time: 06/17/21  2:26 PM  Result Value Ref Range   Acetaminophen (Tylenol), Serum <10 (L) 10 - 30 ug/mL    Comment: (NOTE) Therapeutic concentrations vary significantly. A range of 10-30 ug/mL  may be an effective concentration for many patients. However, some  are best treated at concentrations outside of this range. Acetaminophen concentrations >150 ug/mL at 4 hours after ingestion  and >50 ug/mL at 12 hours after ingestion are often associated with  toxic reactions.  Performed at [facility], [address] [place], [place] [address]   cbc     Status: Abnormal   Collection Time: 06/17/21  2:26 PM  Result Value Ref Range   WBC 7.6 4.0 - 10.5 K/uL   RBC 5.10 4.22 - 5.81 MIL/uL   Hemoglobin 16.4 13.0 - 17.0 g/dL   HCT 44.5 39.0 - 52.0 %   MCV 87.3 80.0 - 100.0 fL   MCH 32.2 26.0 - 34.0 pg   MCHC 36.9 (H) 30.0 - 36.0 g/dL   RDW 11.8 11.5 - 15.5 %    Platelets 209 150 - 400 K/uL   nRBC 0.0 0.0 - 0.2 %    Comment: Performed at [facility], [address], [place],  [address]  Urine Drug Screen, Qualitative     Status: Abnormal  Collection Time: 06/17/21  4:17 PM  Result Value Ref Range   Tricyclic, Ur Screen NONE DETECTED NONE DETECTED   Amphetamines, Ur Screen NONE DETECTED NONE DETECTED   MDMA (Ecstasy)Ur Screen NONE DETECTED NONE DETECTED   Cocaine Metabolite,Ur [place] NONE DETECTED NONE DETECTED   Opiate, Ur Screen NONE DETECTED NONE DETECTED   Phencyclidine (PCP) Ur S NONE DETECTED NONE DETECTED   Cannabinoid 50 Ng, Ur [place] POSITIVE (A) NONE DETECTED   Barbiturates, Ur Screen NONE DETECTED NONE DETECTED   Benzodiazepine, Ur Scrn NONE DETECTED NONE DETECTED   Methadone Scn, Ur NONE DETECTED NONE DETECTED    Comment: (NOTE) Tricyclics + metabolites, urine    Cutoff 1000 ng/mL Amphetamines + metabolites, urine  Cutoff 1000 ng/mL MDMA (Ecstasy), urine              Cutoff 500 ng/mL Cocaine Metabolite, urine          Cutoff 300 ng/mL Opiate + metabolites, urine        Cutoff 300 ng/mL Phencyclidine (PCP), urine         Cutoff 25 ng/mL Cannabinoid, urine                 Cutoff 50 ng/mL Barbiturates + metabolites, urine  Cutoff 200 ng/mL Benzodiazepine, urine              Cutoff 200 ng/mL Methadone, urine                   Cutoff 300 ng/mL  The urine drug screen provides only a preliminary, unconfirmed analytical test result and should not be used for non-medical purposes. Clinical consideration and professional judgment should be applied to any positive drug screen result due to possible interfering substances. A more specific alternate chemical method must be used in order to obtain a confirmed analytical result. Gas chromatography / mass spectrometry (GC/MS) is the preferred confirm atory method. Performed at [facility], [address] [place], [place] [address]   Resp Panel by RT-PCR (Flu A&B,  Covid) Nasopharyngeal Swab     Status: None   Collection Time: 06/17/21  4:17 PM   Specimen: Nasopharyngeal Swab; Nasopharyngeal(NP) swabs in vial transport medium  Result Value Ref Range   SARS Coronavirus 2 by RT PCR NEGATIVE NEGATIVE    Comment: (NOTE) SARS-CoV-2 target nucleic acids are NOT DETECTED.  The SARS-CoV-2 RNA is generally detectable in upper respiratory specimens during the acute phase of infection. The lowest concentration of SARS-CoV-2 viral copies this assay can detect is 138 copies/mL. A negative result does not preclude SARS-Cov-2 infection and should not be used as the sole basis for treatment or other patient management decisions. A negative result may occur with  improper specimen collection/handling, submission of specimen other than nasopharyngeal swab, presence of viral mutation(s) within the areas targeted by this assay, and inadequate number of viral copies(<138 copies/mL). A negative result must be combined with clinical observations, patient history, and epidemiological information. The expected result is Negative.  Fact Sheet for Patients:  EntrepreneurPulse.com.au  Fact Sheet for Healthcare Providers:  IncredibleEmployment.be  This test is no t yet approved or cleared by the [place] FDA and  has been authorized for detection and/or diagnosis of SARS-CoV-2 by FDA under an Emergency Use Authorization (EUA). This EUA will remain  in effect (meaning this test can be used) for the duration of the COVID-19 declaration under Section 564(b)(1) of the Act, 21 U.S.C.section 360bbb-3(b)(1), unless the authorization is terminated  or revoked sooner.  Influenza A by PCR NEGATIVE NEGATIVE   Influenza B by PCR NEGATIVE NEGATIVE    Comment: (NOTE) The Xpert Xpress SARS-CoV-2/FLU/RSV plus assay is intended as an aid in the diagnosis of influenza from Nasopharyngeal swab specimens and should not be used as a sole  basis for treatment. Nasal washings and aspirates are unacceptable for Xpert Xpress SARS-CoV-2/FLU/RSV testing.  Fact Sheet for Patients: EntrepreneurPulse.com.au  Fact Sheet for Healthcare Providers: IncredibleEmployment.be  This test is not yet approved or cleared by the [place] FDA and has been authorized for detection and/or diagnosis of SARS-CoV-2 by FDA under an Emergency Use Authorization (EUA). This EUA will remain in effect (meaning this test can be used) for the duration of the COVID-19 declaration under Section 564(b)(1) of the Act, 21 U.S.C. section 360bbb-3(b)(1), unless the authorization is terminated or revoked.  Performed at [facility], [address] [place], [place] [address]     No current facility-administered medications for this encounter.   Current Outpatient Medications  Medication Sig Dispense Refill   desvenlafaxine (PRISTIQ) 50 MG 24 hr tablet Take 50 mg by mouth 2 (two) times daily.     hydrOXYzine (ATARAX/VISTARIL) 10 MG tablet Take 10 mg by mouth 2 (two) times daily as needed for anxiety.     metFORMIN (GLUCOPHAGE) 500 MG tablet Take 1 tablet (500 mg total) by mouth 2 (two) times daily with a meal. (Patient not taking: No sig reported) 60 tablet 1   QUEtiapine (SEROQUEL) 100 MG tablet Take 1 tablet (100 mg total) by mouth at bedtime. (Patient not taking: No sig reported) 30 tablet 1   sertraline (ZOLOFT) 100 MG tablet Take 1 tablet (100 mg total) by mouth at bedtime. (Patient not taking: No sig reported) 30 tablet 1    Musculoskeletal: Strength & Muscle Tone: within normal limits Gait & Station: normal Patient leans: N/A   Psychiatric Specialty Exam:  Presentation  General Appearance: Appropriate for Environment Eye Contact:Good Speech:Clear and Coherent Speech Volume:Normal Handedness:No data recorded  Mood and Affect  Mood:Anxious; Depressed Affect:Congruent  Thought Process   Thought Processes:Coherent Descriptions of Associations:Intact Orientation:Full (Time, Place and Person) Thought Content:Logical History of Schizophrenia/Schizoaffective disorder:No data recorded Duration of Psychotic Symptoms:No data recorded Hallucinations:Hallucinations: None Ideas of Reference:None Suicidal Thoughts:Suicidal Thoughts: No Homicidal Thoughts:Homicidal Thoughts: No  Sensorium  Memory:Immediate Good Judgment:Good Insight:Good  Executive Functions  Concentration:Fair Attention Span:[place] Language:Good  Psychomotor Activity  Psychomotor Activity:Psychomotor Activity: Normal  Assets  Assets:Resilience; Physical Health; Social Support; Housing; Catering manager; Desire for Improvement  Sleep  Sleep:Sleep: Poor  Physical Exam: Physical Exam Vitals and nursing note reviewed.  HENT:     Head: Normocephalic.     Nose: No congestion or rhinorrhea.  Eyes:     General:        Right eye: No discharge.        Left eye: No discharge.  Cardiovascular:     Rate and Rhythm: Normal rate.  Pulmonary:     Effort: Pulmonary effort is normal.  Musculoskeletal:        General: Normal range of motion.     Cervical back: Normal range of motion.  Skin:    General: Skin is dry.  Neurological:     Mental Status: He is alert and oriented to person, place, and time.  Psychiatric:        Attention and Perception: Attention normal.        Mood and Affect: Mood is depressed. Affect is tearful.  Speech: Speech normal.        Behavior: Behavior normal. Behavior is cooperative.        Thought Content: Thought content includes suicidal ideation.        Cognition and Memory: Cognition normal.        Judgment: Judgment normal.   Review of Systems  Psychiatric/Behavioral:  Positive for depression and suicidal ideas. Negative for hallucinations, memory loss and substance abuse. The patient is not nervous/anxious and does  not have insomnia.   All other systems reviewed and are negative. Blood pressure 121/79, pulse 80, temperature 99.2 F (37.3 C), temperature source Oral, resp. rate 18, height 5\' 10"  (1.778 m), weight 68.5 kg, SpO2 96 %. Body mass index is 21.67 kg/m.  Treatment Plan Summary: 55 year old male with long history of depression presenting with suicidal thoughts with.  Recommend psychiatric hospitalization for safety and stability.  Consulted with EDP  Disposition: Recommend psychiatric Inpatient admission when medically cleared. Supportive therapy provided about ongoing stressors.  Sherlon Handing, NP 06/17/2021 7:57 PM

## 2021-06-17 NOTE — ED Provider Notes (Addendum)
[facility] Emergency Department Provider Note  ____________________________________________   Event Date/Time   First MD Initiated Contact with Patient 06/17/21 1509     (approximate)  I have reviewed the triage vital signs and the nursing notes.   HISTORY  Chief Complaint Suicidal and Depression    HPI Edgar White is a 55 y.o. male with depression, diabetes who comes in with concern for depression and SI for several weeks, constant, worse over the past week, nothing makes it better or worse.  He has not only thought of any specific plans but just has thoughts of hurting himself.  He states that if he was to do that he would try to overdose on pills.  Patient reports a few years ago attempting to overdose on pills and having to be admitted to the psychiatric hospital.  He is denies taking any pills today or any self-harm today.  States that he has been laying in bed and not really eating or drinking as much due to depression.  He states that he has diabetes and is not on any medications for that.          Past Medical History:  Diagnosis Date   Depression    Diabetes mellitus without complication [facility])     Patient Active Problem List   Diagnosis Date Noted   Cannabis use disorder, moderate, dependence ([place]) 11/04/2016   Tobacco use disorder 11/04/2016   Severe recurrent major depression without psychotic features ([place]) 11/04/2016   Substance induced mood disorder ([place]) 10/13/2016   Diabetes mellitus without complication ([place]) 30/02/6225   Anxiety state    Polysubstance abuse ([place])    Major depressive disorder, recurrent severe without psychotic features ([place]) 04/13/2016    Past Surgical History:  Procedure Laterality Date   INGUINAL HERNIA REPAIR     LUMBAR FUSION      Prior to Admission medications   Medication Sig Start Date End Date Taking? Authorizing Provider  metFORMIN (GLUCOPHAGE) 500 MG tablet Take 1 tablet (500 mg total) by  mouth 2 (two) times daily with a meal. 02/06/18   Pucilowska, Jolanta B, MD  QUEtiapine (SEROQUEL) 100 MG tablet Take 1 tablet (100 mg total) by mouth at bedtime. 02/05/18   Pucilowska, Herma Ard B, MD  sertraline (ZOLOFT) 100 MG tablet Take 1 tablet (100 mg total) by mouth at bedtime. 02/05/18   Pucilowska, Wardell Honour, MD    Allergies Patient has no known allergies.  No family history on file.  Social History Social History   Tobacco Use   Smoking status: Every Day    Packs/day: 0.50    Types: Cigarettes   Smokeless tobacco: Never  Substance Use Topics   Alcohol use: No    Comment: denies use   Drug use: Yes    Types: Marijuana, Cocaine      Review of Systems Constitutional: No fever/chills Eyes: No visual changes. ENT: No sore throat. Cardiovascular: Denies chest pain. Respiratory: Denies shortness of breath. Gastrointestinal: No abdominal pain.  No nausea, no vomiting.  No diarrhea.  No constipation. Genitourinary: Negative for dysuria. Musculoskeletal: Negative for back pain. Skin: Negative for rash. Neurological: Negative for headaches, focal weakness or numbness. Psych: Depression, SI All other ROS negative ____________________________________________   PHYSICAL EXAM:  VITAL SIGNS: ED Triage Vitals  Enc Vitals Group     BP 06/17/21 1336 (!) 142/92     Pulse Rate 06/17/21 1336 (!) 114     Resp 06/17/21 1336 16  Temp 06/17/21 1336 98.5 F (36.9 C)     Temp src --      SpO2 06/17/21 1336 99 %     Weight 06/17/21 1333 151 lb (68.5 kg)     Height 06/17/21 1333 5\' 10"  (1.778 m)     Head Circumference --      Peak Flow --      Pain Score 06/17/21 1333 0     Pain Loc --      Pain Edu? --      Excl. in [place]? --     Constitutional: Alert and oriented. Well appearing and in no acute distress. Eyes: Conjunctivae are normal. No swelling around eyes Head: Atraumatic. Nose: No congestion/rhinnorhea. Mouth/Throat: Mucous membranes are moist.   Neck: No stridor.  Trachea Midline. FROM Cardiovascular: Normal rate, no swelling noted Respiratory: No increased wob, no stridor Gastrointestinal: Soft and nontender. No distention. No abdominal bruits.  Musculoskeletal: No lower extremity tenderness nor edema.  No joint effusions. Neurologic:  Normal speech and language. No gross focal neurologic deficits are appreciated.  Skin:  Skin is warm, dry and intact. No rash noted. Psychiatric: Positive depression, SI GU: Deferred   ____________________________________________   LABS (all labs ordered are listed, but only abnormal results are displayed)  Labs Reviewed  COMPREHENSIVE METABOLIC PANEL - Abnormal; Notable for the following components:      Result Value   Sodium 134 (*)    Glucose, Bld 180 (*)    BUN 21 (*)    All other components within normal limits  SALICYLATE LEVEL - Abnormal; Notable for the following components:   Salicylate Lvl <5.2 (*)    All other components within normal limits  ACETAMINOPHEN LEVEL - Abnormal; Notable for the following components:   Acetaminophen (Tylenol), Serum <10 (*)    All other components within normal limits  CBC - Abnormal; Notable for the following components:   MCHC 36.9 (*)    All other components within normal limits  ETHANOL  URINE DRUG SCREEN, QUALITATIVE ([facility] ONLY)   ____________________________________________   INITIAL IMPRESSION / ASSESSMENT AND PLAN / ED COURSE  Edgar White was evaluated in Emergency Department on 06/17/2021 for the symptoms described in the history of present illness. He was evaluated in the context of the global COVID-19 pandemic, which necessitated consideration that the patient might be at risk for infection with the SARS-CoV-2 virus that causes COVID-19. Institutional protocols and algorithms that pertain to the evaluation of patients at risk for COVID-19 are in a state of rapid change based on information released by regulatory bodies including the CDC and federal  and state organizations. These policies and algorithms were followed during the patient's care in the ED.    Pt is without any acute medical complaints. No exam findings to suggest medical cause of current presentation. Will order psychiatric screening labs and discuss further w/ psychiatric service.  At this time patient is willing to stay voluntary to be evaluated by psychiatry  D/d includes but is not limited to psychiatric disease, behavioral/personality disorder, inadequate socioeconomic support, medical.  Based on HPI, exam, unremarkable labs, no concern for acute medical problem at this time. No rigidity, clonus, hyperthermia, focal neurologic deficit, diaphoresis, tachycardia, meningismus, ataxia, gait abnormality or other finding to suggest this visit represents a non-psychiatric problem. Screening labs reviewed.    Given this, pt medically cleared, to be dispositioned per Psych.    The patient has been placed in psychiatric observation due to the need to provide  a safe environment for the patient while obtaining psychiatric consultation and evaluation, as well as ongoing medical and medication management to treat the patient's condition.  The patient has been placed vol at this time.        ____________________________________________   FINAL CLINICAL IMPRESSION(S) / ED DIAGNOSES   Final diagnoses:  Suicidal ideation      MEDICATIONS GIVEN DURING THIS VISIT:  Medications - No data to display   ED Discharge Orders     None        Note:  This document was prepared using Dragon voice recognition software and may include unintentional dictation errors.    Vanessa Warren, MD 06/17/21 1536    Vanessa [place], MD 06/17/21 [phone]

## 2021-06-17 NOTE — ED Notes (Signed)
Pt given food tray and drink. HOB adjusted for pt.

## 2021-06-17 NOTE — ED Triage Notes (Signed)
Pt to ED via POV, pt states that he has been having depression and SI for the last week or so. Pt reports that he has not ate since Saturday. Pt states that he does not currently have a plan, pt has attempted suicide once in the past. Pt states that he is currently on medication for depression, pt has been taking his medications. Pt denies any new life stressors. Pt states that he is having some financial problems at this time. Pt is calm and cooperative in triage.

## 2021-06-18 ENCOUNTER — Inpatient Hospital Stay
Admission: AD | Admit: 2021-06-18 | Discharge: 2021-07-09 | DRG: 885 | Disposition: A | Discharge status: Home / Self Care | Payer: No Typology Code available for payment source | Source: Intra-hospital | Attending: Psychiatry | Admitting: Psychiatry

## 2021-06-18 ENCOUNTER — Other Ambulatory Visit: Payer: Self-pay

## 2021-06-18 ENCOUNTER — Encounter: Payer: Self-pay | Admitting: Psychiatry

## 2021-06-18 DIAGNOSIS — E119 Type 2 diabetes mellitus without complications: Secondary | ICD-10-CM | POA: Diagnosis present

## 2021-06-18 DIAGNOSIS — R45851 Suicidal ideations: Secondary | ICD-10-CM | POA: Diagnosis present

## 2021-06-18 DIAGNOSIS — F332 Major depressive disorder, recurrent severe without psychotic features: Principal | ICD-10-CM | POA: Diagnosis present

## 2021-06-18 DIAGNOSIS — Z7984 Long term (current) use of oral hypoglycemic drugs: Secondary | ICD-10-CM | POA: Diagnosis not present

## 2021-06-18 DIAGNOSIS — F17210 Nicotine dependence, cigarettes, uncomplicated: Secondary | ICD-10-CM | POA: Diagnosis present

## 2021-06-18 DIAGNOSIS — Z9151 Personal history of suicidal behavior: Secondary | ICD-10-CM | POA: Diagnosis not present

## 2021-06-18 DIAGNOSIS — Z20822 Contact with and (suspected) exposure to covid-19: Secondary | ICD-10-CM | POA: Diagnosis present

## 2021-06-18 DIAGNOSIS — Z981 Arthrodesis status: Secondary | ICD-10-CM | POA: Diagnosis not present

## 2021-06-18 DIAGNOSIS — Z79899 Other long term (current) drug therapy: Secondary | ICD-10-CM | POA: Diagnosis not present

## 2021-06-18 MED ORDER — TRAZODONE HCL 50 MG PO TABS
100.0000 mg | ORAL_TABLET | Freq: Every evening | ORAL | Status: DC | PRN
  Administered 2021-06-21: 100 mg via ORAL
  Administered 2021-06-25 – 2021-06-28 (×2): 50 mg via ORAL
  Administered 2021-06-29: 100 mg via ORAL
  Filled 2021-06-18 (×6): qty 2

## 2021-06-18 MED ORDER — ONDANSETRON 4 MG PO TBDP
4.0000 mg | ORAL_TABLET | Freq: Once | ORAL | Status: AC
  Administered 2021-06-18: 4 mg via ORAL
  Filled 2021-06-18 (×2): qty 1

## 2021-06-18 MED ORDER — HYDROXYZINE HCL 25 MG PO TABS
50.0000 mg | ORAL_TABLET | Freq: Three times a day (TID) | ORAL | Status: DC | PRN
  Administered 2021-07-03 – 2021-07-07 (×4): 50 mg via ORAL
  Filled 2021-06-18 (×6): qty 2

## 2021-06-18 MED ORDER — HYDROXYZINE HCL 10 MG PO TABS
10.0000 mg | ORAL_TABLET | Freq: Two times a day (BID) | ORAL | Status: DC | PRN
  Filled 2021-06-18: qty 1

## 2021-06-18 MED ORDER — MAGNESIUM HYDROXIDE 400 MG/5ML PO SUSP: 30.0000 mL | Freq: Every day | ORAL | Status: DC | PRN

## 2021-06-18 MED ORDER — ACETAMINOPHEN 325 MG PO TABS
650.0000 mg | ORAL_TABLET | Freq: Four times a day (QID) | ORAL | Status: DC | PRN
  Administered 2021-06-19 – 2021-06-26 (×3): 650 mg via ORAL
  Filled 2021-06-18 (×3): qty 2

## 2021-06-18 MED ORDER — ALUM & MAG HYDROXIDE-SIMETH 200-200-20 MG/5ML PO SUSP
30.0000 mL | ORAL | Status: DC | PRN
  Administered 2021-06-25: 30 mL via ORAL
  Filled 2021-06-18: qty 30

## 2021-06-18 MED ORDER — VENLAFAXINE HCL ER 75 MG PO CP24
150.0000 mg | ORAL_CAPSULE | Freq: Every day | ORAL | Status: DC
  Administered 2021-06-19: 150 mg via ORAL
  Filled 2021-06-18: qty 2

## 2021-06-18 MED ORDER — VENLAFAXINE HCL ER 75 MG PO CP24
150.0000 mg | ORAL_CAPSULE | Freq: Every day | ORAL | Status: DC
  Administered 2021-06-18: 150 mg via ORAL
  Filled 2021-06-18: qty 2

## 2021-06-18 MED ORDER — HYDROXYZINE HCL 10 MG PO TABS
10.0000 mg | ORAL_TABLET | Freq: Two times a day (BID) | ORAL | Status: DC | PRN
  Administered 2021-06-21: 10 mg via ORAL
  Filled 2021-06-18 (×2): qty 1

## 2021-06-18 NOTE — ED Provider Notes (Addendum)
Emergency Medicine Observation Re-evaluation Note  Edgar White is a 56 y.o. male, seen on rounds today.  Pt initially presented to the ED for complaints of Suicidal and Depression Currently, the patient is calm, resting.  Physical Exam  BP 117/68   Pulse 80   Temp 97.9 F (36.6 C) (Oral)   Resp 17   Ht 5\' 10"  (1.778 m)   Wt 68.5 kg   SpO2 98%   BMI 21.67 kg/m    ED Course / MDM  EKG:   I have reviewed the labs performed to date as well as medications administered while in observation.  Recent changes in the last 24 hours include none  Plan  Current plan is for voluntary psych disposition  Edgar White is not under involuntary commitment.     Edgar Bruce, MD 06/18/21 1030    Edgar Bruce, MD 06/18/21 1030

## 2021-06-18 NOTE — Tx Team (Signed)
Initial Treatment Plan 06/18/2021 7:22 PM Edgar White [id number]    PATIENT STRESSORS: Financial difficulties   Loss of both parents   Other: fear of homelessness     PATIENT STRENGTHS: Motivation for treatment/growth  Physical Health    PATIENT IDENTIFIED PROBLEMS: Ineffective coping skills  Financial difficulties  Medication adjustment needed                 DISCHARGE CRITERIA:  Improved stabilization in mood, thinking, and/or behavior Motivation to continue treatment in a less acute level of care  PRELIMINARY DISCHARGE PLAN: Outpatient therapy Return to previous living arrangement Return to previous work or school arrangements  PATIENT/FAMILY INVOLVEMENT: This treatment plan has been presented to and reviewed with the patient, Edgar White, and/or family member.  The patient and family have been given the opportunity to ask questions and make suggestions.  Aleen Sells, RN 06/18/2021, 7:22 PM

## 2021-06-18 NOTE — ED Notes (Signed)
VS not taken, patient asleep 

## 2021-06-18 NOTE — ED Notes (Signed)
Hourly rounding reveals patient in room. No complaints, stable, in no acute distress. Q15 minute rounds and monitoring via [employer] Cam[employer] to continue. 

## 2021-06-18 NOTE — Progress Notes (Signed)
Patient presents with sad affect but brightens on approach. Patient noted walking around unit, reports this unit is different from the other side, states it is weird and erie. Patient endorses depression, denies SI, HI, at present. Verbally contracts for safety. Did eat snack, no interaction with peers. Encouragement and support provided, safety checks maintained, pt receptive and remains safe on unit with q 15 min checks.

## 2021-06-18 NOTE — BH Assessment (Signed)
Patient is to be admitted to [facility] by Dr. Weber Cooks.  Attending Physician will be Dr.  Weber Cooks .   Patient has been assigned to room L27, by [facility] Charge Nurse Vinnie Level.   Intake Paper Work has been signed and placed on patient chart.  ER staff is aware of the admission: Luann,ER Secretary   Dr. Starleen Blue, ER MD  [place], Patient's Nurse  Seth Bake, Patient Access.

## 2021-06-18 NOTE — Plan of Care (Signed)
  Problem: Safety: Goal: Periods of time without injury will increase Outcome: Progressing   Problem: Coping: Goal: Will verbalize feelings Outcome: Progressing

## 2021-06-18 NOTE — BH Assessment (Signed)
Comprehensive Clinical Assessment (CCA) Note  06/18/2021 Edgar White [id number] Recommendations for Services/Supports/Treatments: Per Barbaraann Share., B NP, who determined pt. meets inpatient criteria. TTS to seek bed placement.   Edgar White is a 55 year old, English speaking, Caucasian male with a hx of MDD without psychotic features. Pt presented to [facility] ED voluntarily due to pt's psychiatrist's recommendation to go to the ER due to concerns about pt.'s worsening depression and thoughts of SI. Upon assessment, pt endorsed feeling depressed, isolated, and withdrawn. Pt reported that he is single, lives alone, and does not have children. Pt reported that this depressive episode has been getting progressively worse over the last 6 months, however this past week has become unbearable. Pt unable to identify specific stressors or triggers. Pt explained that he lives like a hermit and has stopped activities that he used to enjoy. The pt. was forthcoming about his occasional cannabis use. Pt's UDS is positive for cannabis. Pt explained that he receives medication management at [facility] and complies with his psych meds. The pt reported that he is currently on disability. The pt. reports having sleep disturbance due to having racing thoughts. Pt also reported having appetite disturbance due to ill-fitted dentures and a disdain for chewing food. Pt explained that his appetite has decreased and that he has poor dietary habits that include eating fast food. The pt. had good insight and judgement. Pt did not appear to be responding to internal or external stimuli. Pt presented with normal speech and had cogent thought processes. Pt made good eye contact and was cooperative with the assessment. Pt presented with a depressed mood; affect was congruent. Pt denied current SI/HI/AV/H.  Chief Complaint:  Chief Complaint  Patient presents with   Suicidal   Depression   Visit Diagnosis: Major depressive disorder, recurrent severe  without psychotic features ([place])  CCA Screening, Triage and Referral (STR)  Patient Reported Information How did you hear about [place]? Self  Referral name: No data recorded Referral phone number: No data recorded  Whom do you see for routine medical problems? No data recorded Practice/Facility Name: No data recorded Practice/Facility Phone Number: No data recorded Name of Contact: No data recorded Contact Number: No data recorded Contact Fax Number: No data recorded Prescriber Name: No data recorded Prescriber Address (if known): No data recorded  What Is the Reason for Your Visit/Call Today? Worsening depression; SI  How Long Has This Been Causing You Problems? 1-6 months  What Do You Feel Would Help You the Most Today? Treatment for Depression or other mood problem   Have You Recently Been in Any Inpatient Treatment (Hospital/Detox/Crisis Center/28-Day Program)? No data recorded Name/Location of Program/Hospital:No data recorded How Long Were You There? No data recorded When Were You Discharged? No data recorded  Have You Ever Received Services From [facility] Before? No data recorded Who Do You See at [facility]? No data recorded  Have You Recently Had Any Thoughts About Hurting Yourself? Yes  Are You Planning to Commit Suicide/Harm Yourself At This time? No   Have you Recently Had Thoughts About [place]? No  Explanation: No data recorded  Have You Used Any Alcohol or Drugs in the Past 24 Hours? No  How Long Ago Did You Use Drugs or Alcohol? No data recorded What Did You Use and How Much? No data recorded  Do You Currently Have a Therapist/Psychiatrist? Yes  Name of Therapist/Psychiatrist: [facility]   Have You Been Recently Discharged From Any Office Practice or Programs? No  Explanation of Discharge From Practice/Program: No data recorded    CCA Screening Triage Referral Assessment Type of Contact: No data recorded Is this Initial or Reassessment?  No data recorded Date Telepsych consult ordered in [facility]:  No data recorded Time Telepsych consult ordered in [facility]:  No data recorded  Patient Reported Information Reviewed? No data recorded Patient Left Without Being Seen? No data recorded Reason for Not Completing Assessment: No data recorded  Collateral Involvement: None provided   Does Patient Have a Bibo? No data recorded Name and Contact of Legal Guardian: No data recorded If Minor and Not Living with Parent(s), Who has Custody? No data recorded Is CPS involved or ever been involved? Never  Is APS involved or ever been involved? Never   Patient Determined To Be At Risk for Harm To Self or Others Based on Review of Patient Reported Information or Presenting Complaint? Yes, for Self-Harm  Method: No data recorded Availability of Means: No data recorded Intent: No data recorded Notification Required: No data recorded Additional Information for Danger to Others Potential: No data recorded Additional Comments for Danger to Others Potential: No data recorded Are There Guns or Other Weapons in Your Home? No data recorded Types of Guns/Weapons: No data recorded Are These Weapons Safely Secured?                            No data recorded Who Could Verify You Are Able To Have These Secured: No data recorded Do You Have any Outstanding Charges, Pending Court Dates, Parole/Probation? No data recorded Contacted To Inform of Risk of Harm To Self or Others: No data recorded  Location of Assessment: [facility] ED   Does Patient Present under Involuntary Commitment? No  IVC Papers Initial File Date: No data recorded  [place] of Residence: [place]   Patient Currently Receiving the Following Services: Medication Management   Determination of Need: Emergent (2 hours)   Options For Referral: Inpatient Hospitalization     CCA Biopsychosocial Intake/Chief Complaint:  No data recorded Current  Symptoms/Problems: No data recorded  Patient Reported Schizophrenia/Schizoaffective Diagnosis in Past: No   Strengths: Pt has good insight; able to communicate needs  Preferences: No data recorded Abilities: No data recorded  Type of Services Patient Feels are Needed: No data recorded  Initial Clinical Notes/Concerns: No data recorded  Mental Health Symptoms Depression:   Hopelessness; Weight gain/loss; Sleep (too much or little); Increase/decrease in appetite   Duration of Depressive symptoms:  Greater than two weeks   Mania:   Racing thoughts   Anxiety:    Tension   Psychosis:   None   Duration of Psychotic symptoms: No data recorded  Trauma:   N/A   Obsessions:   None   Compulsions:   None   Inattention:   None   Hyperactivity/Impulsivity:   None   Oppositional/Defiant Behaviors:   None   Emotional Irregularity:   Chronic feelings of emptiness   Other Mood/Personality Symptoms:  No data recorded   Mental Status Exam Appearance and self-care  Stature:   Tall   Weight:   Average weight   Clothing:   -- (In scrubs)   Grooming:   Normal   Cosmetic use:   None   Posture/gait:   Normal   Motor activity:   Not Remarkable   Sensorium  Attention:   Normal   Concentration:   Normal   Orientation:   Object; Person; Place;  Situation   Recall/memory:   Normal   Affect and Mood  Affect:   Depressed   Mood:   Depressed   Relating  Eye contact:   Normal   Facial expression:   Depressed   Attitude toward examiner:   Cooperative   Thought and Language  Speech flow:  Clear and Coherent   Thought content:   Appropriate to Mood and Circumstances   Preoccupation:   None   Hallucinations:   None   Organization:  No data recorded  [employer] of Knowledge:   Average   Intelligence:   Average   Abstraction:   Normal   Judgement:   Good   Reality Testing:   Realistic   Insight:   Good    Decision Making:   Normal   Social Functioning  Social Maturity:   Isolates   Social Judgement:   Normal   Stress  Stressors:   Illness   Coping Ability:   Overwhelmed; Deficient supports   Skill Deficits:   None   Supports:   Family; Support needed     Religion: Religion/Spirituality Are You A Religious Person?:  (Not assessed)  Leisure/Recreation: Leisure / Recreation Do You Have Hobbies?: No  Exercise/Diet: Exercise/Diet Do You Exercise?: No Have You Gained or Lost A Significant Amount of Weight in the Past Six Months?: Yes-Lost Number of Pounds Lost?:  (Unknown amount) Do You Follow a Special Diet?: Yes Type of Diet: Pt reported that his dentures impact his dietary choices. Pt explained that he eats limited items from fast food restaurants because he hates eating/chewing. Do You Have Any Trouble Sleeping?: Yes Explanation of Sleeping Difficulties: Pt reported having issues falling asleep due to his racing thoughts.   CCA Employment/Education Employment/Work Situation: Employment / Work Technical sales engineer: On disability Why is Patient on Disability: For anxiety and depression dx How Long has Patient Been on Disability: 2 years Patient's Job has Been Impacted by Current Illness: No Has Patient ever Been in the [employer]?: No  Education: Education Is Patient Currently Attending School?: No Did Physicist, medical?: No Did You Have An Individualized Education Program (IIEP): No Did You Have Any Difficulty At School?: No Patient's Education Has Been Impacted by Current Illness: No   CCA Family/Childhood History Family and Relationship History: Family history Marital status: Single Does patient have children?: No  Childhood History:  Childhood History By whom was/is the patient raised?: Both parents Did patient suffer from severe childhood neglect?: No Has patient ever been sexually abused/assaulted/raped as an adolescent or adult?:  No Was the patient ever a victim of a crime or a disaster?: No Witnessed domestic violence?: No Has patient been affected by domestic violence as an adult?: No  Child/Adolescent Assessment:     CCA Substance Use Alcohol/Drug Use: Alcohol / Drug Use Pain Medications: SEE PTA  Prescriptions: SEE PTA  Over the Counter: SEE PTA  History of alcohol / drug use?: Yes Longest period of sobriety (when/how long): Unkown Negative Consequences of Use: Personal relationships Withdrawal Symptoms: None                         ASAM's:  Six Dimensions of Multidimensional Assessment  Dimension 1:  Acute Intoxication and/or Withdrawal Potential:   Dimension 1:  Description of individual's past and current experiences of substance use and withdrawal: Pt reported engaging in occassional cannabis use  Dimension 2:  Biomedical Conditions and Complications:  Dimension 3:  Emotional, Behavioral, or Cognitive Conditions and Complications:     Dimension 4:  Readiness to Change:     Dimension 5:  Relapse, Continued use, or Continued Problem Potential:     Dimension 6:  Recovery/Living Environment:     ASAM Severity Score: ASAM's Severity Rating Score: 3  ASAM Recommended Level of Treatment: ASAM Recommended Level of Treatment: Level I Outpatient Treatment   Substance use Disorder (SUD)    Recommendations for Services/Supports/Treatments: Recommendations for Services/Supports/Treatments Recommendations For Services/Supports/Treatments: Individual Therapy  DSM5 Diagnoses: Patient Active Problem List   Diagnosis Date Noted   Cannabis use disorder, moderate, dependence ([place]) 11/04/2016   Tobacco use disorder 11/04/2016   Severe recurrent major depression without psychotic features ([place]) 11/04/2016   Substance induced mood disorder ([place]) 10/13/2016   Diabetes mellitus without complication ([place]) 37/90/2409   Anxiety state    Polysubstance abuse ([place])    Major depressive disorder,  recurrent severe without psychotic features ([place]) 04/13/2016    [place] Schorr R [place], LCAS

## 2021-06-18 NOTE — ED Notes (Signed)
VOL/Consult completed/Pending Placement

## 2021-06-18 NOTE — BH Assessment (Signed)
Referral information for Psychiatric Hospitalization faxed to;   [facility] ([phone]- [phone]),   [facility] [phone]) [phone]  [facility] [phone]),  [place] [phone], [phone], [phone] or [phone]),   [facility] [phone] or [phone])  [facility] [phone]),   [facility] [phone] -or- [phone]),   [facility] [phone])  [place] [phone] or [phone]),   [facility] [phone]).  [facility] [phone])

## 2021-06-18 NOTE — ED Notes (Signed)
VOL per Maggie Font, patient to Behavior Med Unit after shift change

## 2021-06-18 NOTE — ED Notes (Signed)
VOl pending placement 

## 2021-06-18 NOTE — Progress Notes (Addendum)
Patient admitted voluntary to [facility] from [facility] ED with diagnosis of suicidal thoughts and worsening depression.  Patient presents to unit ambulatory.  A&O x 4.  VSS. Patient states,"The depression has gotten worse the past week. Effexor stopped working and I need something else to help me."  Patient endorses depression and anxiety stating his main stressors are financial and fear of homelessness. Patient lives with his brother.  Patient currently denies suicidal ideations, homicidal ideations, audio or visual hallucinations and verbally contracts for safety on unit.  Patient states he lives with his brother who is his main support system. Patient  reports smoking 1/2 PPD and denies ETOH use.   Patient oriented to room, unit and call light.  Patient denies needs and remains safe on the unit at this time.  Skin assessment was done with 2nd RN, Jake Samples. Skin with large blister like mole mid upper back and erythema top of feet bilaterally.  Denies discomfort or pain.

## 2021-06-19 MED ORDER — FLUOXETINE HCL 10 MG PO CAPS
10.0000 mg | ORAL_CAPSULE | Freq: Every day | ORAL | Status: DC
  Administered 2021-06-19 – 2021-06-20 (×2): 10 mg via ORAL
  Filled 2021-06-19 (×2): qty 1

## 2021-06-19 MED ORDER — MIRTAZAPINE 15 MG PO TABS
7.5000 mg | ORAL_TABLET | Freq: Every day | ORAL | Status: DC
  Administered 2021-06-19: 7.5 mg via ORAL
  Filled 2021-06-19: qty 1

## 2021-06-19 NOTE — Plan of Care (Signed)
Patient presents Anxious and Depressed.  Pt is visibly shaking and states his Anxiety and Depression is a 9/10.  Patient denies SI/HI/AVH and contracts for safety.  Pt states "I need some different medications".  Continues to isolate in room and did have minimal participation in therapeutic milieu with encouragement form staff. Patient did comply with scheduled medications as prescribed by Provider.  Patient denies any adverse reactions from medications given. Patient encouraged to notify nursing staff any needs arise.  Q 15 minute safety checks will continue on unit.    Problem: Education: Goal: Knowledge of [place] [facility] Education information/materials will improve Outcome: Progressing Goal: Emotional status will improve Outcome: Progressing Goal: Verbalization of understanding the information provided will improve Outcome: Progressing

## 2021-06-19 NOTE — BHH Suicide Risk Assessment (Signed)
[facility] Admission Suicide Risk Assessment   Nursing information obtained from:  Patient Demographic factors:  Male, Divorced or widowed Current Mental Status:  Suicidal ideation indicated by patient (denies at present) Loss Factors:  Loss of significant relationship, Financial problems / change in socioeconomic status Historical Factors:  Prior suicide attempts Risk Reduction Factors:  Living with another person, especially a relative, Employed  Total Time spent with patient: 55 min Principal Problem: <principal problem not specified> Diagnosis:  Active Problems:   Severe recurrent major depression without psychotic features (HCC)  Subjective Data: and HPI- " Im depressed, I cannot work, Im on disability , Im financially poor" Chart reviewed, pt seen. Edgar White is a 55 y.o. male patient admitted with suicidal thoughts, worsening depression.  Per Psych consult note-  "I saw my doctor today and he told me to come here." Edgar White is a 55 y.o. male patient admitted with suicidal thoughts, worsening depression. Patient is very tearful.  He states that he has had a long history of depression with several hospitalizations and ECT, which he feels did not work very well.  Patient describes that it has gotten particularly worse over the last year when he had all his teeth pulled and got dentures.  He said since that time he does not want to talk to people and the dentures making him feel self-conscious.  He does not want to eat.  He does not like to cook but he finds no enjoyment in that anymore.  He states he has lost a lot of weight.  He has had several losses in the last few years including his mother, father, and his dog.  He states that he is currently getting care at [facility] and is on Zyprexa and Pristiq.  He feels that he needs medication adjustment.  He is on disability due to his depression.  He does not feel that he has any joy in his life but is hopeful that he will get better.  Patient  denies alcohol use.  States that he smoked marijuana several weeks ago but that did not help.  Denies other illicit drugs. Patient meets criteria for inpatient psychiatric admission and desires this. Pt seen today in his room, pt endorses depression for about 5 yrs, after death of his parents, reports depression worsening for last several months, pt endorses hopelessness,, helplessness and intermittent SI, no plan, contracts for safety. Pt reports poor concentration, poor sleep, appetite leading 30 lb wt loss over last few yrs. Reports multiple stressors ,  "I cannot work, Im on disability , Im financially poor", pt lives with his brother, has no kids. We discussed meds, reports pristiq was working but not working anymore, Effexor was tried in the past and didn't work, not wanting to try again, pt willing to try prozac and remeron.  Associated Signs/Symptoms:see above, denies hallucination, denies manic /hypomanic symptoms Depression Symptoms:  yes, see above Duration of Depression Symptoms: Greater than two weeks     Total Time spent with patient: 55 min   Past Psychiatric History -MDD, Anxiety, Several hospitalizations for depression OD in 2013  Continued Clinical Symptoms:  Alcohol Use Disorder Identification Test Final Score (AUDIT): 2 The "Alcohol Use Disorders Identification Test", Guidelines for Use in Primary Care, Second Edition.  World Pharmacologist [facility]). Score between 0-7:  no or low risk or alcohol related problems. Score between 8-15:  moderate risk of alcohol related problems. Score between 16-19:  high risk of alcohol related problems. Score 20 or  above:  warrants further diagnostic evaluation for alcohol dependence and treatment.   CLINICAL FACTORS:   Depression:   Hopelessness Insomnia Severe Previous Psychiatric Diagnoses and Treatments   Musculoskeletal: Strength & Muscle Tone: within normal limits Gait & Station: normal Patient leans:   Psychiatric  Specialty Exam:  Presentation  General Appearance: guarded   Eye Contact:poor Speech:slow Speech Volume:Normal   Handedness:No data recorded   Mood and Affect  Mood:Anxious; Depressed   Affect:Congruent     Thought Process  Thought Processes:Coherent   Duration of Psychotic Symptoms: No data recorded Past Diagnosis of Schizophrenia or Psychoactive disorder: No   Descriptions of Associations:Intact   Orientation:Full (Time, Place and Person)   Thought Content:concrete, hopleessness Hallucinations:No data recorded Ideas of Reference:None   Suicidal Thoughts: yes, no plan Homicidal Thoughts: denies   Sensorium  Memory:Immediate Good   Judgment:poor Insight:Good     Executive Functions  Concentration:limited Attention Span:Fair   [place]   Language:Good     Psychomotor Activity  Psychomotor Activity:No data recorded   Assets  Assets:Resilience; Physical Health; Social Support; Housing; Catering manager; Desire for Improvement     Sleep  Sleep:No data recorded   Physical Exam: Physical Exam HENT:     Head: Normocephalic and atraumatic.  Pulmonary:     Breath sounds: Normal breath sounds.  Musculoskeletal:        General: Normal range of motion.  Neurological:     General: No focal deficit present.     Mental Status: He is alert.   ROS Blood pressure 128/79, pulse 85, temperature 97.8 F (36.6 C), temperature source Oral, resp. rate 18, height 5\' 10"  (1.778 m), weight 68 kg, SpO2 99 %. Body mass index is 21.52 kg/m.   COGNITIVE FEATURES THAT CONTRIBUTE TO RISK:  Closed-mindedness and Polarized thinking    SUICIDE RISK:   Severe:  Frequent, intense, and enduring suicidal ideation, specific plan, no subjective intent, but some objective markers of intent (i.e., choice of lethal method), the method is accessible, some limited preparatory behavior, evidence of impaired self-control, severe  dysphoria/symptomatology, multiple risk factors present, and few if any protective factors, particularly a lack of social support.  PLAN OF CARE:  Daily contact with patient to assess and evaluate symptoms and progress in treatment and Medication management. Pt with hx of depression presenting with worsening depression, SI- Plan- TSH and HbA1c ordered.  Try Prozac for depression/anxiety dc effezxor - pt stateseffexor didn't work in the past and not wanting to try again.  Add remeron for depression, may help with sleep and appetite. Trazodone for sleep. Prn hydroxyzine for anxiety.  Will address medical issues including DM- as appropriate. Group and miliuu tx as tolerated.   I certify that inpatient services furnished can reasonably be expected to improve the patient's condition.   Lenward Chancellor, MD 06/19/2021, 12:33 PM

## 2021-06-19 NOTE — H&P (Addendum)
Psychiatric Admission Assessment Adult  Patient Identification: Edgar White MRN:  [id number] Date of Evaluation:  06/19/2021 Chief Complaint:  Severe recurrent major depression without psychotic features ([place]) [F33.2] Principal Diagnosis: <principal problem not specified> Diagnosis:  Active Problems:   Severe recurrent major depression without psychotic features ([place])  History of Present Illness: and HPI- " Im depressed, I cannot work, Im on disability , Im financially poor" Chart reviewed, pt seen. Edgar White is a 55 y.o. male patient admitted with suicidal thoughts, worsening depression.  Per Psych consult note-  "I saw my doctor today and he told me to come here." Edgar White is a 55 y.o. male patient admitted with suicidal thoughts, worsening depression. Patient is very tearful.  He states that he has had a long history of depression with several hospitalizations and ECT, which he feels did not work very well.  Patient describes that it has gotten particularly worse over the last year when he had all his teeth pulled and got dentures.  He said since that time he does not want to talk to people and the dentures making him feel self-conscious.  He does not want to eat.  He does not like to cook but he finds no enjoyment in that anymore.  He states he has lost a lot of weight.  He has had several losses in the last few years including his mother, father, and his dog.  He states that he is currently getting care at [facility] and is on Zyprexa and Pristiq.  He feels that he needs medication adjustment.  He is on disability due to his depression.  He does not feel that he has any joy in his life but is hopeful that he will get better.  Patient denies alcohol use.  States that he smoked marijuana several weeks ago but that did not help.  Denies other illicit drugs. Patient meets criteria for inpatient psychiatric admission and desires this. Pt seen today in his room, pt endorses depression  for about 5 yrs, after death of his parents, reports depression worsening for last several months, pt endorses hopelessness,, helplessness and intermittent SI, no plan, contracts for safety. Pt reports poor concentration, poor sleep, appetite leading 30 lb wt loss over last few yrs. Reports multiple stressors ,  "I cannot work, Im on disability , Im financially poor", pt lives with his brother, has no kids. We discussed meds, reports pristiq was working but not working anymore, Effexor was tried in the past and didn't work, not wanting to try again, pt willing to try prozac and remeron.  Associated Signs/Symptoms:see above, denies hallucination, denies manic /hypomanic symptoms Depression Symptoms:  yes, see above Duration of Depression Symptoms: Greater than two weeks   Total Time spent with patient: 55 min  Past Psychiatric History -MDD, Anxiety, Several hospitalizations for depression OD in 2013 Is the patient at risk to self? Yes.    Has the patient been a risk to self in the past 6 months? Yes.    Has the patient been a risk to self within the distant past? Yes.    Is the patient a risk to others? No.  Has the patient been a risk to others in the past 6 months? No.  Has the patient been a risk to others within the distant past?    Prior Inpatient Therapy:   Prior Outpatient Therapy:    Alcohol Screening: 1. How often do you have a drink containing alcohol?: 2 to 4 times a  month 2. How many drinks containing alcohol do you have on a typical day when you are drinking?: 1 or 2 3. How often do you have six or more drinks on one occasion?: Never AUDIT-C Score: 2 4. How often during the last year have you found that you were not able to stop drinking once you had started?: Never 5. How often during the last year have you failed to do what was normally expected from you because of drinking?: Never 6. How often during the last year have you needed a first drink in the morning to get yourself  going after a heavy drinking session?: Never 7. How often during the last year have you had a feeling of guilt of remorse after drinking?: Never 8. How often during the last year have you been unable to remember what happened the night before because you had been drinking?: Never 9. Have you or someone else been injured as a result of your drinking?: No 10. Has a relative or friend or a doctor or another health worker been concerned about your drinking or suggested you cut down?: No Alcohol Use Disorder Identification Test Final Score (AUDIT): 2 Substance Abuse History in the last 12 months:  smokes cig 1/2 PPD, occ THC , UDS positive for THC Consequences of Substance Abuse: Previous Psychotropic Medications: yes, multiple Psychological Evaluations: Past Medical History:  Past Medical History:  Diagnosis Date   Depression    Diabetes mellitus without complication ([place])     Past Surgical History:  Procedure Laterality Date   INGUINAL HERNIA REPAIR     LUMBAR FUSION     Family History: History reviewed. No pertinent family history. Family Psychiatric  History:  Tobacco Screening:   Social History:  Social History   Substance and Sexual Activity  Alcohol Use No   Comment: denies use     Social History   Substance and Sexual Activity  Drug Use Yes   Types: Marijuana, Cocaine    Additional Social History:                           Allergies:  No Known Allergies Lab Results:  Results for orders placed or performed during the hospital encounter of 06/17/21 (from the past 48 hour(s))  Comprehensive metabolic panel     Status: Abnormal   Collection Time: 06/17/21  2:26 PM  Result Value Ref Range   Sodium 134 (L) 135 - 145 mmol/L   Potassium 4.4 3.5 - 5.1 mmol/L   Chloride 100 98 - 111 mmol/L   CO2 25 22 - 32 mmol/L   Glucose, Bld 180 (H) 70 - 99 mg/dL    Comment: Glucose reference range applies only to samples taken after fasting for at least 8 hours.   BUN 21 (H)  6 - 20 mg/dL   Creatinine, Ser 1.11 0.61 - 1.24 mg/dL   Calcium 9.3 8.9 - 10.3 mg/dL   Total Protein 7.1 6.5 - 8.1 g/dL   Albumin 4.2 3.5 - 5.0 g/dL   AST 19 15 - 41 U/L   ALT 14 0 - 44 U/L   Alkaline Phosphatase 60 38 - 126 U/L   Total Bilirubin 0.9 0.3 - 1.2 mg/dL   GFR, Estimated >60 >60 mL/min    Comment: (NOTE) Calculated using the CKD-EPI Creatinine Equation (2021)    Anion gap 9 5 - 15    Comment: Performed at [facility], [address] [place], [place]  [address]  Ethanol     Status: None   Collection Time: 06/17/21  2:26 PM  Result Value Ref Range   Alcohol, Ethyl (B) <10 <10 mg/dL    Comment: (NOTE) Lowest detectable limit for serum alcohol is 10 mg/dL.  For medical purposes only. Performed at [facility], [address] [place], [place] [address]   Salicylate level     Status: Abnormal   Collection Time: 06/17/21  2:26 PM  Result Value Ref Range   Salicylate Lvl <5.8 (L) 7.0 - 30.0 mg/dL    Comment: Performed at [facility], [address] [place], [place] [address]  Acetaminophen level     Status: Abnormal   Collection Time: 06/17/21  2:26 PM  Result Value Ref Range   Acetaminophen (Tylenol), Serum <10 (L) 10 - 30 ug/mL    Comment: (NOTE) Therapeutic concentrations vary significantly. A range of 10-30 ug/mL  may be an effective concentration for many patients. However, some  are best treated at concentrations outside of this range. Acetaminophen concentrations >150 ug/mL at 4 hours after ingestion  and >50 ug/mL at 12 hours after ingestion are often associated with  toxic reactions.  Performed at [facility], [address] [place], [place] [address]   cbc     Status: Abnormal   Collection Time: 06/17/21  2:26 PM  Result Value Ref Range   WBC 7.6 4.0 - 10.5 K/uL   RBC 5.10 4.22 - 5.81 MIL/uL   Hemoglobin 16.4 13.0 - 17.0 g/dL   HCT 44.5 39.0 - 52.0 %   MCV 87.3 80.0 - 100.0 fL   MCH 32.2 26.0 - 34.0 pg    MCHC 36.9 (H) 30.0 - 36.0 g/dL   RDW 11.8 11.5 - 15.5 %   Platelets 209 150 - 400 K/uL   nRBC 0.0 0.0 - 0.2 %    Comment: Performed at [facility], [address], [place], [place] [address]  Urine Drug Screen, Qualitative     Status: Abnormal   Collection Time: 06/17/21  4:17 PM  Result Value Ref Range   Tricyclic, Ur Screen NONE DETECTED NONE DETECTED   Amphetamines, Ur Screen NONE DETECTED NONE DETECTED   MDMA (Ecstasy)Ur Screen NONE DETECTED NONE DETECTED   Cocaine Metabolite,Ur [place] NONE DETECTED NONE DETECTED   Opiate, Ur Screen NONE DETECTED NONE DETECTED   Phencyclidine (PCP) Ur S NONE DETECTED NONE DETECTED   Cannabinoid 50 Ng, Ur [place] POSITIVE (A) NONE DETECTED   Barbiturates, Ur Screen NONE DETECTED NONE DETECTED   Benzodiazepine, Ur Scrn NONE DETECTED NONE DETECTED   Methadone Scn, Ur NONE DETECTED NONE DETECTED    Comment: (NOTE) Tricyclics + metabolites, urine    Cutoff 1000 ng/mL Amphetamines + metabolites, urine  Cutoff 1000 ng/mL MDMA (Ecstasy), urine              Cutoff 500 ng/mL Cocaine Metabolite, urine          Cutoff 300 ng/mL Opiate + metabolites, urine        Cutoff 300 ng/mL Phencyclidine (PCP), urine         Cutoff 25 ng/mL Cannabinoid, urine                 Cutoff 50 ng/mL Barbiturates + metabolites, urine  Cutoff 200 ng/mL Benzodiazepine, urine              Cutoff 200 ng/mL Methadone, urine  Cutoff 300 ng/mL  The urine drug screen provides only a preliminary, unconfirmed analytical test result and should not be used for non-medical purposes. Clinical consideration and professional judgment should be applied to any positive drug screen result due to possible interfering substances. A more specific alternate chemical method must be used in order to obtain a confirmed analytical result. Gas chromatography / mass spectrometry (GC/MS) is the preferred confirm atory method. Performed at [facility], [address] [place], [place] [address]   Resp Panel by RT-PCR (Flu A&B, Covid) Nasopharyngeal Swab     Status: None   Collection Time: 06/17/21  4:17 PM   Specimen: Nasopharyngeal Swab; Nasopharyngeal(NP) swabs in vial transport medium  Result Value Ref Range   SARS Coronavirus 2 by RT PCR NEGATIVE NEGATIVE    Comment: (NOTE) SARS-CoV-2 target nucleic acids are NOT DETECTED.  The SARS-CoV-2 RNA is generally detectable in upper respiratory specimens during the acute phase of infection. The lowest concentration of SARS-CoV-2 viral copies this assay can detect is 138 copies/mL. A negative result does not preclude SARS-Cov-2 infection and should not be used as the sole basis for treatment or other patient management decisions. A negative result may occur with  improper specimen collection/handling, submission of specimen other than nasopharyngeal swab, presence of viral mutation(s) within the areas targeted by this assay, and inadequate number of viral copies(<138 copies/mL). A negative result must be combined with clinical observations, patient history, and epidemiological information. The expected result is Negative.  Fact Sheet for Patients:  EntrepreneurPulse.com.au  Fact Sheet for Healthcare Providers:  IncredibleEmployment.be  This test is no t yet approved or cleared by the [place] FDA and  has been authorized for detection and/or diagnosis of SARS-CoV-2 by FDA under an Emergency Use Authorization (EUA). This EUA will remain  in effect (meaning this test can be used) for the duration of the COVID-19 declaration under Section 564(b)(1) of the Act, 21 U.S.C.section 360bbb-3(b)(1), unless the authorization is terminated  or revoked sooner.       Influenza A by PCR NEGATIVE NEGATIVE   Influenza B by PCR NEGATIVE NEGATIVE    Comment: (NOTE) The Xpert Xpress SARS-CoV-2/FLU/RSV plus assay is intended as an aid in the diagnosis of influenza from  Nasopharyngeal swab specimens and should not be used as a sole basis for treatment. Nasal washings and aspirates are unacceptable for Xpert Xpress SARS-CoV-2/FLU/RSV testing.  Fact Sheet for Patients: EntrepreneurPulse.com.au  Fact Sheet for Healthcare Providers: IncredibleEmployment.be  This test is not yet approved or cleared by the [place] FDA and has been authorized for detection and/or diagnosis of SARS-CoV-2 by FDA under an Emergency Use Authorization (EUA). This EUA will remain in effect (meaning this test can be used) for the duration of the COVID-19 declaration under Section 564(b)(1) of the Act, 21 U.S.C. section 360bbb-3(b)(1), unless the authorization is terminated or revoked.  Performed at [facility], [address] [place], [place] [address]     Blood Alcohol level:  Lab Results  Component Value Date   [facility] <10 06/17/2021   ETH <10 41/66/0630    Metabolic Disorder Labs:  Lab Results  Component Value Date   HGBA1C 5.8 (H) 02/02/2018   MPG 119.76 02/02/2018   MPG 192 10/13/2016   No results found for: PROLACTIN Lab Results  Component Value Date   CHOL 235 (H) 02/02/2018   TRIG 146 02/02/2018   HDL 35 (L) 02/02/2018   CHOLHDL 6.7 02/02/2018   VLDL 29 02/02/2018  LDLCALC 171 (H) 02/02/2018   LDLCALC 153 (H) 11/05/2016    Current Medications: Current Facility-Administered Medications  Medication Dose Route Frequency Provider Last Rate Last Admin   acetaminophen (TYLENOL) tablet 650 mg  650 mg Oral Q6H PRN Clapacs, Madie Reno, MD   650 mg at 06/19/21 0801   alum & mag hydroxide-simeth (MAALOX/MYLANTA) 200-200-20 MG/5ML suspension 30 mL  30 mL Oral Q4H PRN Clapacs, Madie Reno, MD       hydrOXYzine (ATARAX/VISTARIL) tablet 10 mg  10 mg Oral BID PRN Clapacs, Madie Reno, MD       hydrOXYzine (ATARAX/VISTARIL) tablet 50 mg  50 mg Oral TID PRN Clapacs, Madie Reno, MD       magnesium hydroxide (MILK OF MAGNESIA)  suspension 30 mL  30 mL Oral Daily PRN Clapacs, Madie Reno, MD       traZODone (DESYREL) tablet 100 mg  100 mg Oral QHS PRN Clapacs, Madie Reno, MD       venlafaxine XR (EFFEXOR-XR) 24 hr capsule 150 mg  150 mg Oral Q breakfast Clapacs, John T, MD   150 mg at 06/19/21 0800   PTA Medications: Medications Prior to Admission  Medication Sig Dispense Refill Last Dose   desvenlafaxine (PRISTIQ) 50 MG 24 hr tablet Take 50 mg by mouth 2 (two) times daily.      hydrOXYzine (ATARAX/VISTARIL) 10 MG tablet Take 10 mg by mouth 2 (two) times daily as needed for anxiety.      metFORMIN (GLUCOPHAGE) 500 MG tablet Take 1 tablet (500 mg total) by mouth 2 (two) times daily with a meal. (Patient not taking: No sig reported) 60 tablet 1    QUEtiapine (SEROQUEL) 100 MG tablet Take 1 tablet (100 mg total) by mouth at bedtime. (Patient not taking: No sig reported) 30 tablet 1    sertraline (ZOLOFT) 100 MG tablet Take 1 tablet (100 mg total) by mouth at bedtime. (Patient not taking: No sig reported) 30 tablet 1     Musculoskeletal: Strength & Muscle Tone: within normal limits Gait & Station: normal Patient leans:   Physical Exam Vitals and nursing note reviewed.  HENT:     Head: Normocephalic.     Nose: No congestion or rhinorrhea.  Eyes:     General:        Right eye: No discharge.        Left eye: No discharge.  Cardiovascular:     Rate and Rhythm: Normal rate.  Pulmonary:     Effort: Pulmonary effort is normal.  Musculoskeletal:        General: Normal range of motion.     Cervical back: Normal range of motion.  Skin:    General: Skin is dry.  Neurological:     Mental Status: He is alert and oriented to person, place, and time.  Psychiatric:     See below          Psychiatric Specialty Exam:  Presentation  General Appearance: guarded  Eye Contact:poor Speech:slow Speech Volume:Normal  Handedness:No data recorded  Mood and Affect  Mood:Anxious;  Depressed  Affect:Congruent   Thought Process  Thought Processes:Coherent  Duration of Psychotic Symptoms: No data recorded Past Diagnosis of Schizophrenia or Psychoactive disorder: No  Descriptions of Associations:Intact  Orientation:Full (Time, Place and Person)  Thought Content:concrete, hopleessness Hallucinations:No data recorded Ideas of Reference:None  Suicidal Thoughts: yes, no plan Homicidal Thoughts: denies  Sensorium  Memory:Immediate Good  Judgment:poor Insight:Good   Executive Functions  Concentration:limited Attention Span:Fair  Recall:Good  Fund of Knowledge:Good  Language:Good   Psychomotor Activity  Psychomotor Activity:No data recorded  Assets  Assets:Resilience; Physical Health; Social Support; Housing; Catering manager; Desire for Improvement   Sleep  Sleep:No data recorded   Physical Exam: Physical Exam ROS Blood pressure 128/79, pulse 85, temperature 97.8 F (36.6 C), temperature source Oral, resp. rate 18, height 5\' 10"  (1.778 m), weight 68 kg, SpO2 99 %. Body mass index is 21.52 kg/m. Physical Exam Vitals and nursing note reviewed.  HENT:     Head: Normocephalic.     Nose: No congestion or rhinorrhea.  Eyes:     General:        Right eye: No discharge.        Left eye: No discharge.  Cardiovascular:     Rate and Rhythm: Normal rate.  Pulmonary:     Effort: Pulmonary effort is normal.  Musculoskeletal:        General: Normal range of motion.     Cervical back: Normal range of motion.  Skin:    General: Skin is dry.  Neurological:     Mental Status: He is alert and oriented to person, place, and time.  Psychiatric:     See below Treatment Plan Summary: Daily contact with patient to assess and evaluate symptoms and progress in treatment and Medication management. Pt with hx of depression presenting with worsening depression, SI- Plan- TSH and HbA1c ordered.  Try Prozac for depression/anxiety dc  effezxor - pt stateseffexor didn't work in the past and not wanting to try again.  Add remeron for depression, may help with sleep and appetite. Trazodone for sleep. Prn hydroxyzine for anxiety.  Will address medical issues including DM- as appropriate. Group and miliuu tx as tolerated.      Observation Level/Precautions:  suicide  Laboratory:    Psychotherapy:    Medications:    Consultations:    Discharge Concerns:    Estimated LOS:  Other:     Physician Treatment Plan for Primary Diagnosis: <principal problem not specified> Long Term Goal(s): Improvement in symptoms so as ready for discharge  Short Term Goals: Ability to identify changes in lifestyle to reduce recurrence of condition will improve, Ability to verbalize feelings will improve, Ability to disclose and discuss suicidal ideas, Ability to demonstrate self-control will improve, Ability to identify and develop effective coping behaviors will improve, Ability to maintain clinical measurements within normal limits will improve, Compliance with prescribed medications will improve, and Ability to identify triggers associated with substance abuse/mental health issues will improve  Physician Treatment Plan for Secondary Diagnosis: Active Problems:   Severe recurrent major depression without psychotic features ([place])  Long Term Goal(s): Improvement in symptoms so as ready for discharge  Short Term Goals: Ability to identify changes in lifestyle to reduce recurrence of condition will improve, Ability to verbalize feelings will improve, Ability to disclose and discuss suicidal ideas, Ability to demonstrate self-control will improve, Ability to identify and develop effective coping behaviors will improve, Ability to maintain clinical measurements within normal limits will improve, Compliance with prescribed medications will improve, and Ability to identify triggers associated with substance abuse/mental health issues will improve  I  certify that inpatient services furnished can reasonably be expected to improve the patient's condition.    Lenward Chancellor, MD 10/29/202212:03 PM

## 2021-06-19 NOTE — Group Note (Signed)
LCSW Group Therapy Note  Group Date: 06/19/2021 Start Time: 4142 End Time: 1610   Type of Therapy and Topic:  Group Therapy - Healthy vs Unhealthy Coping Skills  Participation Level:  Minimal   Description of Group The focus of this group was to determine what unhealthy coping techniques typically are used by group members and what healthy coping techniques would be helpful in coping with various problems. Patients were guided in becoming aware of the differences between healthy and unhealthy coping techniques. Patients were asked to identify 2-3 healthy coping skills they would like to learn to use more effectively.  Therapeutic Goals Patients learned that coping is what human beings do all day long to deal with various situations in their lives Patients defined and discussed healthy vs unhealthy coping techniques Patients identified their preferred coping techniques and identified whether these were healthy or unhealthy Patients determined 2-3 healthy coping skills they would like to become more familiar with and use more often. Patients provided support and ideas to each other   Summary of Patient Progress: Patient was present for the entirety of the group session. Patient presented with a flat affect and appeared anxious. Patient was an active listener and participated in the group discussion when prompted. Patient shared coping skills that have helped him in the past, including breathing exercises and going for a walk. Patient also participated in the grounding exercise during the session.  Therapeutic Modalities Cognitive Behavioral Therapy Motivational Perkins, Latanya Presser 06/19/2021  5:23 PM

## 2021-06-20 DIAGNOSIS — F332 Major depressive disorder, recurrent severe without psychotic features: Secondary | ICD-10-CM | POA: Diagnosis not present

## 2021-06-20 LAB — TSH: TSH: 1.152 u[IU]/mL (ref 0.350–4.500)

## 2021-06-20 LAB — HEMOGLOBIN A1C
Hgb A1c MFr Bld: 6.1 % — ABNORMAL HIGH (ref 4.8–5.6)
Mean Plasma Glucose: 128.37 mg/dL

## 2021-06-20 MED ORDER — ENSURE ENLIVE PO LIQD
237.0000 mL | Freq: Three times a day (TID) | ORAL | Status: DC
  Administered 2021-06-20 – 2021-07-09 (×51): 237 mL via ORAL

## 2021-06-20 MED ORDER — MIRTAZAPINE 15 MG PO TABS
15.0000 mg | ORAL_TABLET | Freq: Every day | ORAL | Status: DC
  Administered 2021-06-20: 15 mg via ORAL
  Filled 2021-06-20: qty 1

## 2021-06-20 MED ORDER — ADULT MULTIVITAMIN W/MINERALS CH
1.0000 | ORAL_TABLET | Freq: Every day | ORAL | Status: DC
  Administered 2021-06-20 – 2021-07-09 (×20): 1 via ORAL
  Filled 2021-06-20 (×20): qty 1

## 2021-06-20 MED ORDER — FLUOXETINE HCL 20 MG PO CAPS
20.0000 mg | ORAL_CAPSULE | Freq: Every day | ORAL | Status: DC
  Administered 2021-06-21 – 2021-06-24 (×4): 20 mg via ORAL
  Filled 2021-06-20 (×4): qty 1

## 2021-06-20 NOTE — [facility] Suicide Risk Assessment (Signed)
[place] INPATIENT:  Family/Significant Other Suicide Prevention Education  Suicide Prevention Education:  Contact Attempts: Marrianne Mood (sister), [phone] has been identified by the patient as the family member/significant other with whom the patient will be residing, and identified as the person(s) who will aid the patient in the event of a mental health crisis.  With written consent from the patient, two attempts were made to provide suicide prevention education, prior to and/or following the patient's discharge.  We were unsuccessful in providing suicide prevention education.  A suicide education pamphlet was given to the patient to share with family/significant other.  Date and time of first attempt: 06/20/2021 11:32AM Date and time of second attempt: A second attempt will be made at a later time.   CSW placed call to K. Crespo, no answer. Left HIPAA compliant voicemail requesting return call.   Kenna Gilbert Tariq Pernell 06/20/2021, 11:33 AM

## 2021-06-20 NOTE — Progress Notes (Signed)
Patient pleasant and cooperative with care. Medication compliant. Appropriate with staff and peers. Noted in dayroom socializing with peers. In no distress. Reports depression improving. Denies SI, HI, AVH. Reports has lost 30lbs since 2017, reports prior to admission he had not eaten in 5 days. Patient excited about starting remeron in hopes it will help with depression and increase his appetite. Patient offered sleep medication but declined in hopes that remeron will aid with that as well.  Encouragement and support provided. Safety checks maintained. Medications given as prescribed. Pt receptive and remains safe on unit with q 15 min checks.

## 2021-06-20 NOTE — BHH Suicide Risk Assessment (Signed)
[facility] IN[facility]IENT:  Family/Significant Other Suicide Prevention Education  Suicide Prevention Education:  Education Completed; Marrianne Mood (sister), [phone] has been identified by the patient as the family member/significant other with whom the patient will be residing, and identified as the person(s) who will aid the patient in the event of a mental health crisis (suicidal ideations/suicide attempt).  With written consent from the patient, the family member/significant other has been provided the following suicide prevention education, prior to the and/or following the discharge of the patient.  The suicide prevention education provided includes the following: Suicide risk factors Suicide prevention and interventions National Suicide Hotline telephone number [facility] assessment telephone number [facility] Emergency Assistance [place] and/or Residential Mobile Crisis Unit telephone number  Request made of family/significant other to: Remove weapons (e.g., guns, rifles, knives), all items previously/currently identified as safety concern.   Remove drugs/medications (over-the-counter, prescriptions, illicit drugs), all items previously/currently identified as a safety concern.  The family member/significant other verbalizes understanding of the suicide prevention education information provided.  The family member/significant other agrees to remove the items of safety concern listed above.  CSW spoke with patient's sister, Raliegh Ip. Burnadette Peter who reports there are no firearms or safety concerns at patient's place of residence.    Kenna Gilbert Voncille Simm 06/20/2021, 3:31 PM

## 2021-06-20 NOTE — Plan of Care (Signed)
D- Patient alert and oriented. Patient presents in a depressed, but pleasant mood on assessment stating that he slept good last night and had no complaints to voice to this writer at this time. Patient endorsed both depression and anxiety, rating his depression an "8/10". Patient stated "I always feel bad on Sundays", but did not go into detail as to what this meant. Patient also reported anxiety "just thinking about going home". Patient denies SI, HI, AVH, and pain at this time. Patient had no stated goals for today. Patient did request that he take his medication at night being that this is the time he takes it at home. MD did change this order.  A- Scheduled medications administered to patient, per MD orders. Support and encouragement provided.  Routine safety checks conducted every 15 minutes.  Patient informed to notify staff with problems or concerns.  R- No adverse drug reactions noted. Patient contracts for safety at this time. Patient compliant with medications and treatment plan. Patient receptive, calm, and cooperative. Patient interacts well with others on the unit.  Patient remains safe at this time.  Problem: Education: Goal: Knowledge of [place] [facility] Education information/materials will improve Outcome: Not Progressing Goal: Emotional status will improve Outcome: Not Progressing Goal: Mental status will improve Outcome: Not Progressing Goal: Verbalization of understanding the information provided will improve Outcome: Not Progressing   Problem: Activity: Goal: Interest or engagement in activities will improve Outcome: Not Progressing Goal: Sleeping patterns will improve Outcome: Not Progressing   Problem: Coping: Goal: Ability to verbalize frustrations and anger appropriately will improve Outcome: Not Progressing Goal: Ability to demonstrate self-control will improve Outcome: Not Progressing   Problem: Health Behavior/Discharge Planning: Goal: Identification of  resources available to assist in meeting health care needs will improve Outcome: Not Progressing Goal: Compliance with treatment plan for underlying cause of condition will improve Outcome: Not Progressing   Problem: Physical Regulation: Goal: Ability to maintain clinical measurements within normal limits will improve Outcome: Not Progressing   Problem: Safety: Goal: Periods of time without injury will increase Outcome: Not Progressing   Problem: Education: Goal: Utilization of techniques to improve thought processes will improve Outcome: Not Progressing Goal: Knowledge of the prescribed therapeutic regimen will improve Outcome: Not Progressing   Problem: Activity: Goal: Interest or engagement in leisure activities will improve Outcome: Not Progressing Goal: Imbalance in normal sleep/wake cycle will improve Outcome: Not Progressing   Problem: Coping: Goal: Coping ability will improve Outcome: Not Progressing Goal: Will verbalize feelings Outcome: Not Progressing   Problem: Health Behavior/Discharge Planning: Goal: Ability to make decisions will improve Outcome: Not Progressing Goal: Compliance with therapeutic regimen will improve Outcome: Not Progressing   Problem: Role Relationship: Goal: Will demonstrate positive changes in social behaviors and relationships Outcome: Not Progressing   Problem: Safety: Goal: Ability to disclose and discuss suicidal ideas will improve Outcome: Not Progressing Goal: Ability to identify and utilize support systems that promote safety will improve Outcome: Not Progressing   Problem: Self-Concept: Goal: Will verbalize positive feelings about self Outcome: Not Progressing Goal: Level of anxiety will decrease Outcome: Not Progressing

## 2021-06-20 NOTE — Progress Notes (Signed)
Patient is alert and oriented. He is pleasant and cooperative with assessment. Patient denies SI, HI, and AVH. He endorses depression and rates it as a 6/10, but says that he feels it is improving. Patient is compliant with Remeron. Patient was receptive to medication education. He states that the Remeron helped him go to sleep immediately yesterday, which he was thankful about. Patient is observed to be interacting appropriately with staff and other patients on the unit. Patient remains safe on the unit at this time.

## 2021-06-20 NOTE — Progress Notes (Signed)
NUTRITION ASSESSMENT  Pt identified as at risk on the Malnutrition Screen Tool  INTERVENTION:  -MVI with minerals daily -Ensure Enlive po TID, each supplement provides 350 kcal and 20 grams of  -Downgrade diet to dysphagia 3 (advanced mechanical soft) for ease of intake  NUTRITION DIAGNOSIS: Unintentional weight loss related to sub-optimal intake as evidenced by pt report.   Goal: Pt to meet >/= 90% of their estimated nutrition needs.  Monitor:  PO intake  Assessment:  Edgar White is a 55 y.o. male patient admitted with suicidal thoughts, worsening depression.   Per chart review, pt with decreased appetite.  Eating is no longer pleasurable to him, as he had all of his teeth pulled and received dentures, which he feels very self-conscious about.   Pt endorses wt loss in H&P, however, wt has been stable over the past 3 years.  No meal completion data available to assess at this time.   Medications reviewed and include remeron.   Labs reviewed: Na: 134, CBGS: 105.   55 y.o. male  Height: Ht Readings from Last 1 Encounters:  06/18/21 5\' 10"  (1.778 m)    Weight: Wt Readings from Last 1 Encounters:  06/18/21 68 kg    Weight Hx: Wt Readings from Last 10 Encounters:  06/18/21 68 kg  06/17/21 68.5 kg  02/02/18 70.8 kg  02/02/18 67.1 kg  11/04/16 75.8 kg  11/03/16 78.9 kg  10/13/16 78 kg  10/12/16 81.2 kg  06/08/16 76.2 kg  04/13/16 71.7 kg    BMI:  Body mass index is 21.52 kg/m. Pt meets criteria for normal range based on current BMI.  Estimated Nutritional Needs: Kcal: 25-30 kcal/kg Protein: > 1 gram protein/kg Fluid: 1 ml/kcal  Diet Order:  Diet Order             Diet Carb Modified Fluid consistency: Thin; Room service appropriate? Yes  Diet effective now                  Pt is also offered choice of unit snacks mid-morning and mid-afternoon.  Pt is eating as desired.   Lab results and medications reviewed.   Loistine Chance, RD, LDN,  [place] Registered Dietitian II Certified Diabetes Care and Education Specialist Please refer to [facility] for RD and/or RD on-call/weekend/after hours pager

## 2021-06-20 NOTE — BHH Counselor (Signed)
CSW spoke to patient's sister, Marrianne Mood ([phone]) to complete SPE. Geannie Risen states she is involved in his care and is taking steps to have patient[facility] where he will be assigned an ACT team. Geannie Risen completed a referral on Friday. Geannie Risen states she has been in touch with patient's current psychiatrist, Dr. Lita Mains at [facility] who is informed that patient will likely be transferring psychiatric medication management to [facility].   Geannie Risen confirms that patient has had a difficult time coping with depression since their father passed in 2014 and mother passed in 2016. Geannie Risen reports patient has several unresolved issues related to parent's death including a history of substance abuse marked by abuse of prescription pain pills and family conflict among patient, herself, and patient's brother regarding their mother's inheritance. Geannie Risen is not aware of patient using any illicit or non-illicit drugs at this time aside from what patient is prescribed.   Idamae Lusher, MSW, [place], [place] 06/20/2021 3:48PM

## 2021-06-20 NOTE — Group Note (Signed)
LCSW Group Therapy Note  Group Date: 06/20/2021 Start Time: 5520 End Time: 1730   Type of Therapy and Topic:  Group Therapy - How To Cope with Nervousness about Discharge   Participation Level:  Active   Description of Group This process group involved identification of patients' feelings about discharge. Some of them are scheduled to be discharged soon, while others are new admissions, but each of them was asked to share thoughts and feelings surrounding discharge from the hospital. One common theme was that they are excited at the prospect of going home, while another was that many of them are apprehensive about sharing why they were hospitalized. Patients were given the opportunity to discuss these feelings with their peers in preparation for discharge.  Therapeutic Goals  Patient will identify their overall feelings about pending discharge. Patient will think about how they might proactively address issues that they believe will once again arise once they get home (i.e. with parents). Patients will participate in discussion about having hope for change.   Summary of Patient Progress: Patient was active during group and participated in discussion. Patient presented as engaged and smiled on occasion. Patient expressed that he does not feel ready to discharge yet. Patient shared that he has been isolated and withdrawn lately and stated that he cannot go back to his old routine. Patient shared about conflict with his family and expressed continued grief of the loss of his parents. Patient expressed hope for the future by coming up with several activities he would like to take part in again, including cooking and socializing more.    Therapeutic Modalities Cognitive Behavioral Therapy   Berniece Salines, LCSWA 06/20/2021  6:01 PM

## 2021-06-20 NOTE — Plan of Care (Signed)
  Problem: Education: Goal: Emotional status will improve Outcome: Progressing   Problem: Activity: Goal: Sleeping patterns will improve Outcome: Progressing

## 2021-06-20 NOTE — BHH Counselor (Signed)
Adult Comprehensive Assessment  Patient ID: Doran Nestle, male   DOB: [birth year], 55 y.o.   MRN: [id number]  Information Source: Information source: Patient  Current Stressors:  Patient states their primary concerns and needs for treatment are:: "To get on a medication that makes me feel alert and clear. I don't feel right. My head feels clouded and confused." Patient states their goals for this hospitilization and ongoing recovery are:: "To get on the right medication." Educational / Learning stressors: Patient denies Employment / Job issues: "Noise at work gives me anxiety.Marland KitchenMarland KitchenI want to scream." Family Relationships: Patient states he has unresolved family conflict with his siblings that began when his mother passed away in 11-30-2014 regarding her estate. Financial / Lack of resources (include bankruptcy): Patient states he feels dependent on his brother due to his limited income. Housing / Lack of housing: Patient denies Physical health (include injuries & life threatening diseases): Patient states he has lost weight due to depression. Social relationships: Patient denies Substance abuse: Patient denies Bereavement / Loss: Patient's father passed away in 11/29/12 and mother passed away in November 30, 2014. Patient states he is still impacted by grief.  Living/Environment/Situation:  Living Arrangements: Other relatives (Patient resides with brother) Living conditions (as described by patient or guardian): "It's comfortable, I have my own bedroom." Who else lives in the home?: Patient's brother How long has patient lived in current situation?: 4 years What is atmosphere in current home: Comfortable  Family History:  Marital status: Single Are you sexually active?: No What is your sexual orientation?: "Gay" Has your sexual activity been affected by drugs, alcohol, medication, or emotional stress?: "Medication and emotional stress." Does patient have children?: No  Childhood History:  By whom was/is  the patient raised?: Both parents Description of patient's relationship with caregiver when they were a child: "Good, I had a very happy family." Patient's description of current relationship with people who raised him/her: Patient's parents are deceased. How were you disciplined when you got in trouble as a child/adolescent?: "Very little." Does patient have siblings?: Yes Number of Siblings: 3 Description of patient's current relationship with siblings: "Still stressful due to the death of my mom." Patient states he is close with one of his sisters. 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 A lot of anxiety...a lot of people and that gives me anxiety." Why is Patient on Disability: Depression Patient's Job has Been Impacted by Current Illness: Yes Describe how Patient's Job has Been Impacted: Patient states it is hard to focus due to loud noises. What is the Longest Time Patient has Held a Job?: 7 years Where was the Patient Employed at that Time?: [employer] Has Patient ever Been in the [employer]?: No  Financial Resources:   Financial resources: Income from employment, [employer] Does patient have a Programmer, applications or guardian?:  No  Alcohol/Substance Abuse:   What has been your use of drugs/alcohol within the last 12 months?: Patient states he smokes marijuana 1x every 6-8 weeks. Patient denies use of alcohol and other substances. If attempted suicide, did drugs/alcohol play a role in this?: No Has alcohol/substance abuse ever caused legal problems?: No  Social Support System:   Patient's Community Support System: Fair Dietitian Support System: "My sister." Type of faith/religion: "Christian" How does patient's faith help to cope with current illness?: "I've lost my faith to be honest because of my depression."  Leisure/Recreation:   Do You Have Hobbies?: No  Strengths/Needs:   What is the patient's perception of their strengths?: "Cooking, listening. These were in the past." Patient states they can use these personal strengths during their treatment to contribute to their recovery: Patient agrees Patient states these barriers may affect/interfere with their treatment: No barriers identified Patient states these barriers may affect their return to the community: No barriers identified  Discharge Plan:   Currently receiving community mental health services: Yes (From Whom) ([facility] - psychiatric medication management) Patient states concerns and preferences for aftercare planning are: Patient states he would like to meet with a therapist in person. Patient states they will know when they are safe and ready for discharge when: "If my thought process and thinking is clear and if I want to get up and get around people." Does patient have access to transportation?: Yes (Patient's car is parked at [facility]) Does patient have financial barriers related to discharge medications?: No Will patient be returning to same living situation after discharge?: Yes  Summary/Recommendations:   Summary and Recommendations (to be completed by the evaluator): Patient is a 55 year old male from [place], [place] ([place])  admitted after voluntarily presenting to [facility] ED due to worsening depression and suicidal ideation. Per chart review, patient's psychiatrist at [facility] advised him to present to the ED. Patient reports his depression symptoms have increased over the past 6 months and requests a medication adjustment. Patient reports continued feelings of grief due to the loss of his mother and father several years ago and ongoing stressful relationships with his siblings regarding how his mother's estate was handled. Patient reports recent weight loss and stress at his part time job. Patient receives SSDI. Patient currently receives psychiatric medication management [facility]. Patient is interested in outpatient therapy and requests in person sessions. Patient states he smokes marijuana occasionally. Patient denies use of alcohol and other illicit substances. Recommendations include: crisis stabilization, therapeutic milieu, encourage group attendance and participation, medication management for mood stabilization and development of comprehensive mental wellness plan.  Kenna Gilbert [place]. 06/20/2021

## 2021-06-20 NOTE — Progress Notes (Signed)
[facility] MD Progress Note  06/20/2021 1:02 PM Edgar White  MRN:  [id number] Subjective and interval hx: I feel better today, still depressed" Edgar White is a 55 y.o. male patient admitted with suicidal thoughts, worsening depression..  Chart reviwed and pt seen.  Nursing report-  Patient pleasant and cooperative with care. Medication compliant. Appropriate with staff and peers. Noted in dayroom socializing with peers. In no distress. Reports depression improving. Denies SI, HI, AVH. Reports has lost 30lbs since 2017, reports prior to admission he had not eaten in 5 days. Patient excited about starting remeron in hopes it will help with depression and increase his appetite. Patient offered sleep medication but declined in hopes that remeron will aid with that as well.  Encouragement and support provided. Safety checks maintained. Medications given as prescribed. Pt receptive and remains safe on unit with q 15 min checks. Pt seen in his room, endorses depression, denies SI today, pt states  he does not follow a low carb diet at home , lost significant wt, wanting to switch to regular diet, no evidence of psychosis.  Principal Problem: <principal problem not specified> Diagnosis: Active Problems:   Severe recurrent major depression without psychotic features ([place])  Total Time spent with patient: 30 min  Past Psychiatric History: see H & P, no new info  Past Medical History:  Past Medical History:  Diagnosis Date   Depression    Diabetes mellitus without complication ([place])     Past Surgical History:  Procedure Laterality Date   INGUINAL HERNIA REPAIR     LUMBAR FUSION     Family History: History reviewed. No pertinent family history. Family Psychiatric  History: see H & P, no new info Social History:  Social History   Substance and Sexual Activity  Alcohol Use No   Comment: denies use     Social History   Substance and Sexual Activity  Drug Use Yes   Types: Marijuana,  Cocaine    Social History   Socioeconomic History   Marital status: Single    Spouse name: Not on file   Number of children: Not on file   Years of education: Not on file   Highest education level: Not on file  Occupational History   Not on file  Tobacco Use   Smoking status: Every Day    Packs/day: 0.50    Types: Cigarettes   Smokeless tobacco: Never  Substance and Sexual Activity   Alcohol use: No    Comment: denies use   Drug use: Yes    Types: Marijuana, Cocaine   Sexual activity: Never  Other Topics Concern   Not on file  Social History Narrative   Not on file   Social Determinants of Health   Financial Resource Strain: Not on file  Food Insecurity: Not on file  Transportation Needs: Not on file  Physical Activity: Not on file  Stress: Not on file  Social Connections: Not on file   Additional Social History:                         Sleep: Fair  Appetite:  Fair  Current Medications: Current Facility-Administered Medications  Medication Dose Route Frequency Provider Last Rate Last Admin   acetaminophen (TYLENOL) tablet 650 mg  650 mg Oral Q6H PRN Clapacs, John T, MD   650 mg at 06/19/21 0801   alum & mag hydroxide-simeth (MAALOX/MYLANTA) 200-200-20 MG/5ML suspension 30 mL  30 mL Oral Q4H  PRN Clapacs, Madie Reno, MD       feeding supplement (ENSURE ENLIVE / ENSURE PLUS) liquid 237 mL  237 mL Oral TID BM Clapacs, John T, MD   237 mL at 06/20/21 1037   FLUoxetine (PROZAC) capsule 10 mg  10 mg Oral Daily Lenward Chancellor, MD   10 mg at 06/20/21 0932   hydrOXYzine (ATARAX/VISTARIL) tablet 10 mg  10 mg Oral BID PRN Clapacs, Madie Reno, MD       hydrOXYzine (ATARAX/VISTARIL) tablet 50 mg  50 mg Oral TID PRN Clapacs, Madie Reno, MD       magnesium hydroxide (MILK OF MAGNESIA) suspension 30 mL  30 mL Oral Daily PRN Clapacs, John T, MD       mirtazapine (REMERON) tablet 7.5 mg  7.5 mg Oral QHS Lenward Chancellor, MD   7.5 mg at 06/19/21 2017   multivitamin with minerals  tablet 1 tablet  1 tablet Oral Daily Clapacs, Madie Reno, MD   1 tablet at 06/20/21 0932   traZODone (DESYREL) tablet 100 mg  100 mg Oral QHS PRN Clapacs, Madie Reno, MD        Lab Results:  Results for orders placed or performed during the hospital encounter of 06/18/21 (from the past 48 hour(s))  TSH     Status: None   Collection Time: 06/20/21  5:53 AM  Result Value Ref Range   TSH 1.152 0.350 - 4.500 uIU/mL    Comment: Performed by a 3rd Generation assay with a functional sensitivity of <=0.01 uIU/mL. Performed at [facility], [address] [place], [place] [address]   Hemoglobin A1c     Status: Abnormal   Collection Time: 06/20/21  5:53 AM  Result Value Ref Range   Hgb A1c MFr Bld 6.1 (H) 4.8 - 5.6 %    Comment: (NOTE) Pre diabetes:          5.7%-6.4%  Diabetes:              >6.4%  Glycemic control for   <7.0% adults with diabetes    Mean Plasma Glucose 128.37 mg/dL    Comment: Performed at [facility] [address] [place], [place] [address]    Blood Alcohol level:  Lab Results  Component Value Date   [facility] <10 06/17/2021   ETH <10 38/18/2993    Metabolic Disorder Labs: Lab Results  Component Value Date   HGBA1C 6.1 (H) 06/20/2021   MPG 128.37 06/20/2021   MPG 119.76 02/02/2018   No results found for: PROLACTIN Lab Results  Component Value Date   CHOL 235 (H) 02/02/2018   TRIG 146 02/02/2018   HDL 35 (L) 02/02/2018   CHOLHDL 6.7 02/02/2018   VLDL 29 02/02/2018   LDLCALC 171 (H) 02/02/2018   LDLCALC 153 (H) 11/05/2016    Physical Findings: AIMS:  , ,  ,  ,    CIWA:    COWS:     Musculoskeletal: Strength & Muscle Tone: within normal limits Gait & Station: normal Patient leans:   Psychiatric Specialty Exam:  Presentation  General Appearance: guarded   Eye Contact:poor Speech:slow Speech Volume:Normal   Handedness:No data recorded   Mood and Affect  Mood:Anxious; Depressed   Affect:Congruent     Thought Process  Thought  Processes:Coherent   Duration of Psychotic Symptoms: No data recorded Past Diagnosis of Schizophrenia or Psychoactive disorder: No   Descriptions of Associations:Intact   Orientation:Full (Time, Place and Person)   Thought Content:concrete, hopleessness Hallucinations:No data  recorded Ideas of Reference:None   Suicidal Thoughts: denies today Homicidal Thoughts: denies   Sensorium  Memory:Immediate Good   Judgment:poor Insight:Good     Executive Functions  Concentration:limited Attention Span:Fair   [place]   Language:Good     Psychomotor Activity  Psychomotor Activity:No data recorded   Assets  Assets:Resilience; Physical Health; Social Support; Housing; Catering manager; Desire for Improvement     Sleep  Sleep:No data recorded  Physical Exam: Physical Exam Constitutional:      Appearance: Normal appearance.  HENT:     Head: Normocephalic and atraumatic.  Pulmonary:     Effort: Pulmonary effort is normal.  Musculoskeletal:        General: Normal range of motion.     Cervical back: Normal range of motion.  Neurological:     General: No focal deficit present.     Mental Status: He is alert.   ROS Blood pressure 117/84, pulse 93, temperature 98 F (36.7 C), temperature source Oral, resp. rate 18, height 5\' 10"  (1.778 m), weight 68 kg, SpO2 100 %. Body mass index is 21.52 kg/m.   Treatment Plan Summary: Daily contact with patient to assess and evaluate symptoms and progress in treatment and Medication management. Pt with hx of depression presenting with worsening depression, SI- Plan- TSH -1.1, wnl and HbA1c -6.1.  Trying Prozac for depression/anxiety , effezxor dced  - pt states effexor didn't work in the past and not wanting to try again.   remeron for depression, may help with sleep and appetite. Trazodone for sleep. Prn hydroxyzine for anxiety.  Will address medical issues including DM- as  appropriate. Group and miliuu tx as tolerated.   Lenward Chancellor, MD 06/20/2021, 1:02 PM

## 2021-06-21 ENCOUNTER — Encounter: Payer: Self-pay | Admitting: Psychiatry

## 2021-06-21 DIAGNOSIS — F332 Major depressive disorder, recurrent severe without psychotic features: Secondary | ICD-10-CM | POA: Diagnosis not present

## 2021-06-21 MED ORDER — METFORMIN HCL 500 MG PO TABS
500.0000 mg | ORAL_TABLET | Freq: Two times a day (BID) | ORAL | Status: DC
  Administered 2021-06-21 – 2021-07-09 (×36): 500 mg via ORAL
  Filled 2021-06-21 (×36): qty 1

## 2021-06-21 NOTE — Progress Notes (Signed)
Recreation Therapy Notes  INPATIENT RECREATION TR PLAN  Patient Details Name: Edgar White MRN: [id number] DOB: 03/22/1966 Today's Date: 06/21/2021  Rec Therapy Plan Is patient appropriate for Therapeutic Recreation?: Yes Treatment times per week: at least 3 Estimated Length of Stay: 5-7 days TR Treatment/Interventions: Group participation (Comment)  Discharge Criteria Pt will be discharged from therapy if:: Discharged Treatment plan/goals/alternatives discussed and agreed upon by:: Patient/family  Discharge Summary     [name] 06/21/2021, 3:58 PM

## 2021-06-21 NOTE — Progress Notes (Signed)
[facility] MD Progress Note  06/21/2021 3:14 PM Edgar White  MRN:  [id number] Subjective: Patient seen for follow-up.  55 year old man with severe major depression.  Patient attended treatment team and was able to articulate a desire to see improvement in symptoms.  This morning he was complaining that the mirtazapine 15 mg last night made him excessively "hung over".  Feeling very tired today.  Did not like it.  Still feeling depressed.  Denies any suicidal intent currently.  Blood sugars have not been rechecked since he came in but his hemoglobin A1c is elevated and his blood sugar on presentation was elevated and he is not currently on any medicine for it. Principal Problem: Severe recurrent major depression without psychotic features ([place]) Diagnosis: Principal Problem:   Severe recurrent major depression without psychotic features ([place]) Active Problems:   Diabetes mellitus without complication ([place])  Total Time spent with patient: 30 minutes  Past Psychiatric History: Past psychiatric history of multiple episodes of recurrent severe depression.  Previous substance abuse under better control now.  Past Medical History:  Past Medical History:  Diagnosis Date   Depression    Diabetes mellitus without complication ([place])     Past Surgical History:  Procedure Laterality Date   INGUINAL HERNIA REPAIR     LUMBAR FUSION     Family History: History reviewed. No pertinent family history. Family Psychiatric  History: See previous Social History:  Social History   Substance and Sexual Activity  Alcohol Use No   Comment: denies use     Social History   Substance and Sexual Activity  Drug Use Yes   Types: Marijuana, Cocaine    Social History   Socioeconomic History   Marital status: Single    Spouse name: Not on file   Number of children: Not on file   Years of education: Not on file   Highest education level: Not on file  Occupational History   Not on file  Tobacco Use    Smoking status: Every Day    Packs/day: 0.50    Types: Cigarettes   Smokeless tobacco: Never  Vaping Use   Vaping Use: Never used  Substance and Sexual Activity   Alcohol use: No    Comment: denies use   Drug use: Yes    Types: Marijuana, Cocaine   Sexual activity: Never  Other Topics Concern   Not on file  Social History Narrative   Not on file   Social Determinants of Health   Financial Resource Strain: Not on file  Food Insecurity: Not on file  Transportation Needs: Not on file  Physical Activity: Not on file  Stress: Not on file  Social Connections: Not on file   Additional Social History:                         Sleep: Fair  Appetite:  Fair  Current Medications: Current Facility-Administered Medications  Medication Dose Route Frequency Provider Last Rate Last Admin   acetaminophen (TYLENOL) tablet 650 mg  650 mg Oral Q6H PRN Hisako Bugh T, MD   650 mg at 06/19/21 0801   alum & mag hydroxide-simeth (MAALOX/MYLANTA) 200-200-20 MG/5ML suspension 30 mL  30 mL Oral Q4H PRN Chanceler Pullin T, MD       feeding supplement (ENSURE ENLIVE / ENSURE PLUS) liquid 237 mL  237 mL Oral TID BM Abed Schar T, MD   237 mL at 06/20/21 2009   FLUoxetine (PROZAC) capsule 20 mg  20 mg Oral Daily Lenward Chancellor, MD   20 mg at 06/21/21 1243   hydrOXYzine (ATARAX/VISTARIL) tablet 10 mg  10 mg Oral BID PRN Rahul Malinak, Madie Reno, MD       hydrOXYzine (ATARAX/VISTARIL) tablet 50 mg  50 mg Oral TID PRN Aireanna Luellen, Madie Reno, MD       magnesium hydroxide (MILK OF MAGNESIA) suspension 30 mL  30 mL Oral Daily PRN Jatorian Renault T, MD       metFORMIN (GLUCOPHAGE) tablet 500 mg  500 mg Oral BID WC Shamarra Warda, Madie Reno, MD       multivitamin with minerals tablet 1 tablet  1 tablet Oral Daily Roquel Burgin, Madie Reno, MD   1 tablet at 06/21/21 1243   traZODone (DESYREL) tablet 100 mg  100 mg Oral QHS PRN Raiven Belizaire, Madie Reno, MD        Lab Results:  Results for orders placed or performed during the hospital  encounter of 06/18/21 (from the past 48 hour(s))  TSH     Status: None   Collection Time: 06/20/21  5:53 AM  Result Value Ref Range   TSH 1.152 0.350 - 4.500 uIU/mL    Comment: Performed by a 3rd Generation assay with a functional sensitivity of <=0.01 uIU/mL. Performed at [facility], [address] [place], [place] [address]   Hemoglobin A1c     Status: Abnormal   Collection Time: 06/20/21  5:53 AM  Result Value Ref Range   Hgb A1c MFr Bld 6.1 (H) 4.8 - 5.6 %    Comment: (NOTE) Pre diabetes:          5.7%-6.4%  Diabetes:              >6.4%  Glycemic control for   <7.0% adults with diabetes    Mean Plasma Glucose 128.37 mg/dL    Comment: Performed at [facility] [address], [place], [place] [address]    Blood Alcohol level:  Lab Results  Component Value Date   [facility] <10 06/17/2021   ETH <10 79/09/4095    Metabolic Disorder Labs: Lab Results  Component Value Date   HGBA1C 6.1 (H) 06/20/2021   MPG 128.37 06/20/2021   MPG 119.76 02/02/2018   No results found for: PROLACTIN Lab Results  Component Value Date   CHOL 235 (H) 02/02/2018   TRIG 146 02/02/2018   HDL 35 (L) 02/02/2018   CHOLHDL 6.7 02/02/2018   VLDL 29 02/02/2018   LDLCALC 171 (H) 02/02/2018   LDLCALC 153 (H) 11/05/2016    Physical Findings: AIMS:  , ,  ,  ,    CIWA:    COWS:     Musculoskeletal: Strength & Muscle Tone: within normal limits Gait & Station: normal Patient leans: N/A  Psychiatric Specialty Exam:  Presentation  General Appearance: Appropriate for Environment  Eye Contact:Good  Speech:Clear and Coherent  Speech Volume:Normal  Handedness:No data recorded  Mood and Affect  Mood:Anxious; Depressed  Affect:Congruent   Thought Process  Thought Processes:Coherent  Descriptions of Associations:Intact  Orientation:Full (Time, Place and Person)  Thought Content:Logical  History of Schizophrenia/Schizoaffective disorder:No  Duration of Psychotic  Symptoms:No data recorded Hallucinations:No data recorded Ideas of Reference:None  Suicidal Thoughts:No data recorded Homicidal Thoughts:No data recorded  Sensorium  Memory:Immediate Good  Judgment:Good  Insight:Good   Executive Functions  Concentration:Fair  Attention Span:Fair  [place]  Language:Good   Psychomotor Activity  Psychomotor Activity:No data recorded  Assets  Assets:Resilience; Physical Health; Social Support;  Housing; Catering manager; Desire for Improvement   Sleep  Sleep:No data recorded   Physical Exam: Physical Exam Vitals and nursing note reviewed.  Constitutional:      Appearance: Normal appearance.  HENT:     Head: Normocephalic and atraumatic.     Mouth/Throat:     Pharynx: Oropharynx is clear.  Eyes:     Pupils: Pupils are equal, round, and reactive to light.  Cardiovascular:     Rate and Rhythm: Normal rate and regular rhythm.  Pulmonary:     Effort: Pulmonary effort is normal.     Breath sounds: Normal breath sounds.  Abdominal:     General: Abdomen is flat.     Palpations: Abdomen is soft.  Musculoskeletal:        General: Normal range of motion.  Skin:    General: Skin is warm and dry.  Neurological:     General: No focal deficit present.     Mental Status: He is alert. Mental status is at baseline.  Psychiatric:        Attention and Perception: Attention normal.        Mood and Affect: Mood is depressed. Affect is blunt.        Speech: Speech is delayed.        Behavior: Behavior is slowed.        Thought Content: Thought content normal. Thought content does not include suicidal ideation.        Cognition and Memory: Cognition normal.        Judgment: Judgment normal.   Review of Systems  Constitutional:  Positive for malaise/fatigue.  HENT: Negative.    Eyes: Negative.   Respiratory: Negative.    Cardiovascular: Negative.   Gastrointestinal: Negative.    Musculoskeletal: Negative.   Skin: Negative.   Neurological: Negative.   Psychiatric/Behavioral:  Positive for depression. Negative for hallucinations, memory loss, substance abuse and suicidal ideas. The patient is not nervous/anxious and does not have insomnia.   Blood pressure 126/78, pulse 66, temperature 97.8 F (36.6 C), temperature source Oral, resp. rate 20, height 5\' 10"  (1.778 m), weight 68 kg, SpO2 100 %. Body mass index is 21.52 kg/m.   Treatment Plan Summary: Medication management and Plan reviewed medication plan on admission.  He was started on 2 antidepressants.  He finds the mirtazapine excessively sedating so we will stop that for now and continue fluoxetine.  Encourage patient to be out of bed interacting with others attending things on the unit.  I am going to restart him on the modest dose of 500 mg Glucophage twice a day for his blood sugar.  Alethia Berthold, MD 06/21/2021, 3:14 PM

## 2021-06-21 NOTE — Progress Notes (Signed)
Patient is calm and cooperative and is observed in the day room watching TV. Patient is minimal in his interactions. Pt complained of depression and anxiety to this Probation officer. Pt wants to speak to the doctor tomorrow about his anti-depressant medication. Pt denies SI, HI and AVH at this time. Pt remains safe on the unit and is monitored with q 15 minute safety checks.

## 2021-06-21 NOTE — Progress Notes (Signed)
Patient in bed awake. Reports that Remeron made him very groggy "I think that's a very high dose". Patient is asking  for dose decrease.

## 2021-06-21 NOTE — Group Note (Signed)
[facility] LCSW Group Therapy Note    Group Date: 06/21/2021 Start Time: 1430 End Time: 1500  Type of Therapy and Topic:  Group Therapy:  Overcoming Obstacles  Participation Level:  [facility] PARTICIPATION LEVEL: Did Not Attend    Description of Group:   In this group patients will be encouraged to explore what they see as obstacles to their own wellness and recovery. They will be guided to discuss their thoughts, feelings, and behaviors related to these obstacles. The group will process together ways to cope with barriers, with attention given to specific choices patients can make. Each patient will be challenged to identify changes they are motivated to make in order to overcome their obstacles. This group will be process-oriented, with patients participating in exploration of their own experiences as well as giving and receiving support and challenge from other group members.  Therapeutic Goals: 1. Patient will identify personal and current obstacles as they relate to admission. 2. Patient will identify barriers that currently interfere with their wellness or overcoming obstacles.  3. Patient will identify feelings, thought process and behaviors related to these barriers. 4. Patient will identify two changes they are willing to make to overcome these obstacles:    Summary of Patient Progress   X   Therapeutic Modalities:   Cognitive Behavioral Therapy Solution Focused Therapy Motivational Interviewing Relapse Prevention Therapy   Shenoa Hattabaugh A [place], LCSWA

## 2021-06-21 NOTE — Plan of Care (Signed)
Has been in bed except for breakfast, which he ate around 1030. Returned to bed reporting that he is still feeling tired. Was encouraged to call staff as needed. Safety precautions reinforced.

## 2021-06-21 NOTE — BH IP Treatment Plan (Signed)
Interdisciplinary Treatment and Diagnostic Plan Update  06/21/2021 Time of Session: 10:00AM Edgar White MRN: [id number]  Principal Diagnosis: <principal problem not specified>  Secondary Diagnoses: Active Problems:   Severe recurrent major depression without psychotic features (HCC)   Current Medications:  Current Facility-Administered Medications  Medication Dose Route Frequency Provider Last Rate Last Admin   acetaminophen (TYLENOL) tablet 650 mg  650 mg Oral Q6H PRN Clapacs, Madie Reno, MD   650 mg at 06/19/21 0801   alum & mag hydroxide-simeth (MAALOX/MYLANTA) 200-200-20 MG/5ML suspension 30 mL  30 mL Oral Q4H PRN Clapacs, Madie Reno, MD       feeding supplement (ENSURE ENLIVE / ENSURE PLUS) liquid 237 mL  237 mL Oral TID BM Clapacs, John T, MD   237 mL at 06/20/21 2009   FLUoxetine (PROZAC) capsule 20 mg  20 mg Oral Daily Lenward Chancellor, MD       hydrOXYzine (ATARAX/VISTARIL) tablet 10 mg  10 mg Oral BID PRN Clapacs, Madie Reno, MD       hydrOXYzine (ATARAX/VISTARIL) tablet 50 mg  50 mg Oral TID PRN Clapacs, Madie Reno, MD       magnesium hydroxide (MILK OF MAGNESIA) suspension 30 mL  30 mL Oral Daily PRN Clapacs, Madie Reno, MD       mirtazapine (REMERON) tablet 15 mg  15 mg Oral QHS Lenward Chancellor, MD   15 mg at 06/20/21 2113   multivitamin with minerals tablet 1 tablet  1 tablet Oral Daily Clapacs, Madie Reno, MD   1 tablet at 06/20/21 0932   traZODone (DESYREL) tablet 100 mg  100 mg Oral QHS PRN Clapacs, Madie Reno, MD       PTA Medications: Medications Prior to Admission  Medication Sig Dispense Refill Last Dose   desvenlafaxine (PRISTIQ) 50 MG 24 hr tablet Take 50 mg by mouth 2 (two) times daily.      hydrOXYzine (ATARAX/VISTARIL) 10 MG tablet Take 10 mg by mouth 2 (two) times daily as needed for anxiety.      metFORMIN (GLUCOPHAGE) 500 MG tablet Take 1 tablet (500 mg total) by mouth 2 (two) times daily with a meal. (Patient not taking: No sig reported) 60 tablet 1    QUEtiapine (SEROQUEL)  100 MG tablet Take 1 tablet (100 mg total) by mouth at bedtime. (Patient not taking: No sig reported) 30 tablet 1    sertraline (ZOLOFT) 100 MG tablet Take 1 tablet (100 mg total) by mouth at bedtime. (Patient not taking: No sig reported) 30 tablet 1     Patient Stressors: Financial difficulties   Loss of both parents   Other: fear of homelessness    Patient Strengths: Motivation for treatment/growth  Physical Health   Treatment Modalities: Medication Management, Group therapy, Case management,  1 to 1 session with clinician, Psychoeducation, Recreational therapy.   Physician Treatment Plan for Primary Diagnosis: <principal problem not specified> Long Term Goal(s): Improvement in symptoms so as ready for discharge   Short Term Goals: Ability to identify changes in lifestyle to reduce recurrence of condition will improve Ability to verbalize feelings will improve Ability to disclose and discuss suicidal ideas Ability to demonstrate self-control will improve Ability to identify and develop effective coping behaviors will improve Ability to maintain clinical measurements within normal limits will improve Compliance with prescribed medications will improve Ability to identify triggers associated with substance abuse/mental health issues will improve  Medication Management: Evaluate patient's response, side effects, and tolerance of medication regimen.  Therapeutic Interventions: 1 to  1 sessions, Unit Group sessions and Medication administration.  Evaluation of Outcomes: Not Met  Physician Treatment Plan for Secondary Diagnosis: Active Problems:   Severe recurrent major depression without psychotic features ([place])  Long Term Goal(s): Improvement in symptoms so as ready for discharge   Short Term Goals: Ability to identify changes in lifestyle to reduce recurrence of condition will improve Ability to verbalize feelings will improve Ability to disclose and discuss suicidal  ideas Ability to demonstrate self-control will improve Ability to identify and develop effective coping behaviors will improve Ability to maintain clinical measurements within normal limits will improve Compliance with prescribed medications will improve Ability to identify triggers associated with substance abuse/mental health issues will improve     Medication Management: Evaluate patient's response, side effects, and tolerance of medication regimen.  Therapeutic Interventions: 1 to 1 sessions, Unit Group sessions and Medication administration.  Evaluation of Outcomes: Not Met   RN Treatment Plan for Primary Diagnosis: <principal problem not specified> Long Term Goal(s): Knowledge of disease and therapeutic regimen to maintain health will improve  Short Term Goals: Ability to remain free from injury will improve, Ability to verbalize frustration and anger appropriately will improve, Ability to demonstrate self-control, Ability to participate in decision making will improve, Ability to verbalize feelings will improve, Ability to disclose and discuss suicidal ideas, Ability to identify and develop effective coping behaviors will improve, and Compliance with prescribed medications will improve  Medication Management: RN will administer medications as ordered by provider, will assess and evaluate patient's response and provide education to patient for prescribed medication. RN will report any adverse and/or side effects to prescribing provider.  Therapeutic Interventions: 1 on 1 counseling sessions, Psychoeducation, Medication administration, Evaluate responses to treatment, Monitor vital signs and CBGs as ordered, Perform/monitor CIWA, COWS, AIMS and Fall Risk screenings as ordered, Perform wound care treatments as ordered.  Evaluation of Outcomes: Not Met   LCSW Treatment Plan for Primary Diagnosis: <principal problem not specified> Long Term Goal(s): Safe transition to appropriate next  level of care at discharge, Engage patient in therapeutic group addressing interpersonal concerns.  Short Term Goals: Engage patient in aftercare planning with referrals and resources, Increase social support, Increase ability to appropriately verbalize feelings, Increase emotional regulation, Facilitate acceptance of mental health diagnosis and concerns, Identify triggers associated with mental health/substance abuse issues, and Increase skills for wellness and recovery  Therapeutic Interventions: Assess for all discharge needs, 1 to 1 time with Social worker, Explore available resources and support systems, Assess for adequacy in community support network, Educate family and significant other(s) on suicide prevention, Complete Psychosocial Assessment, Interpersonal group therapy.  Evaluation of Outcomes: Not Met   Progress in Treatment: Attending groups: No. Participating in groups: No. Taking medication as prescribed: Yes. Toleration medication: No. Family/Significant other contact made: Yes, individual(s) contacted:  pt's sister Patient understands diagnosis: Yes. Discussing patient identified problems/goals with staff: Yes. Medical problems stabilized or resolved: Yes. Denies suicidal/homicidal ideation: Yes. Issues/concerns per patient self-inventory: No. Other: None  New problem(s) identified: No, Describe:  None  New Short Term/Long Term Goal(s):  elimination of symptoms of psychosis, medication management for mood stabilization; elimination of SI thoughts; development of comprehensive mental wellness plan.   Patient Goals:  "To get on meds that I feel work so I can get back to self."  Discharge Plan or Barriers: CSW will assist patient in development of appropriate discharge/aftercare plan.   Reason for Continuation of Hospitalization: Depression Medication stabilization  Estimated Length of Stay: TBD  Scribe for Treatment Team: Davionne Mastrangelo A [place], Latanya Presser 06/21/2021 10:22  AM

## 2021-06-21 NOTE — Progress Notes (Signed)
Patient currently still in bed asleep. Writer attempted  to wake him up for breakfast but pt appeared to be in deep sleep. Will continue to monitor.

## 2021-06-21 NOTE — Progress Notes (Signed)
Recreation Therapy Notes  INPATIENT RECREATION THERAPY ASSESSMENT  Patient Details Name: Edgar White MRN: [id number] DOB: [birth year] Today's Date: 06/21/2021       Information Obtained From: Patient (Patient stated that he did not feel up to it right now)  Able to Participate in Assessment/Interview:    Patient Presentation:    Reason for Admission (Per Patient):    Patient Stressors:    Coping Skills:      Leisure Interests (2+):     Frequency of Recreation/Participation:    Awareness of Community Resources:     [employer]:     Current Use:    If no, Barriers?:    Expressed Interest in [address] of [address]:     Patient Main Form of Transportation:    Patient Strengths:     Patient Identified Areas of Improvement:     Patient Goal for Hospitalization:     Current SI (including self-harm):     Current HI:     Current AVH:    Staff Intervention Plan:    Consent to Intern Participation:    Jaretzi Droz 06/21/2021, 3:57 PM

## 2021-06-21 NOTE — Progress Notes (Signed)
Recreation Therapy Notes  Date: 06/21/2021  Time: 1:30 pm   Location: Dayroom     Behavioral response: N/A   Intervention Topic: Happiness     Discussion/Intervention: Patient did not attend group.   Clinical Observations/Feedback:  Patient did not attend group.   Kina Shiffman LRT/CTRS         Abrianna Sidman 06/21/2021 3:56 PM

## 2021-06-22 DIAGNOSIS — F332 Major depressive disorder, recurrent severe without psychotic features: Secondary | ICD-10-CM | POA: Diagnosis not present

## 2021-06-22 MED ORDER — MIRTAZAPINE 15 MG PO TABS
7.5000 mg | ORAL_TABLET | Freq: Every day | ORAL | Status: DC
  Administered 2021-06-22 – 2021-06-30 (×9): 7.5 mg via ORAL
  Filled 2021-06-22 (×9): qty 1

## 2021-06-22 NOTE — Progress Notes (Signed)
On initial round after report Pt is resting quietly in room w/o s/s of distress.  Will continue to monitor throughout shift as ordered for any changes in behaviors and for continued safety.

## 2021-06-22 NOTE — Progress Notes (Signed)
Patient is calm and cooperative. Pt enjoyed his visit with his sister today. Pt denies SI/ HI and AVH at this time. Pt voiced no complaints to this Probation officer. Pt is medication compliant. Pt remains on q 15 minute safety checks.

## 2021-06-22 NOTE — Progress Notes (Signed)
Pt took a.m. medications w/o incident.

## 2021-06-22 NOTE — Group Note (Signed)
[facility] LCSW Group Therapy Note   Group Date: 06/22/2021 Start Time: 1430 End Time: 1500   Type of Therapy/Topic:  Group Therapy:  Emotion Regulation  Participation Level:  Active     Description of Group:    The purpose of this group is to assist patients in learning to regulate negative emotions and experience positive emotions. Patients will be guided to discuss ways in which they have been vulnerable to their negative emotions. These vulnerabilities will be juxtaposed with experiences of positive emotions or situations, and patients challenged to use positive emotions to combat negative ones. Special emphasis will be placed on coping with negative emotions in conflict situations, and patients will process healthy conflict resolution skills.  Therapeutic Goals: Patient will identify two positive emotions or experiences to reflect on in order to balance out negative emotions:  Patient will label two or more emotions that they find the most difficult to experience:  Patient will be able to demonstrate positive conflict resolution skills through discussion or role plays:   Summary of Patient Progress:   Patient was present for the entirety of the group session. Patient was an active listener and participated in the topic of discussion, appeared to be pleasant in mood, provided helpful advice to others, and added nuance to topic of conversation. CSW provided "emotional regulation" BINGO activity. He discussed throughout the game that he tended to blame himself in situations. He said that his biggest fear was  "failure." He stated that he has not always been depressed and would "prefer to be happy;" but his depression has made him feel negative about himself. He stated he was grateful that he had good parents, good memories and believes good will come his way.   Therapeutic Modalities:   Cognitive Behavioral Therapy Feelings Identification Dialectical Behavioral Therapy   Sheridan Gettel A [place],  LCSWA

## 2021-06-22 NOTE — Progress Notes (Signed)
Recreation Therapy Notes   Date: 06/22/2021  Time: 1:30 pm  Location: Court yard   Behavioral response: N/A   Intervention Topic: Relaxation    Discussion/Intervention: Patient did not attend group.   Clinical Observations/Feedback:  Patient did not attend group.   Hoover Grewe LRT/CTRS        Niccolas Loeper 06/22/2021 4:15 PM

## 2021-06-22 NOTE — Progress Notes (Signed)
[facility] MD Progress Note  06/22/2021 6:36 PM Edgar White  MRN:  [id number] Subjective:  still depressed but no current suicidal intent Principal Problem: Severe recurrent major depression without psychotic features ([place]) Diagnosis: Principal Problem:   Severe recurrent major depression without psychotic features ([place]) Active Problems:   Diabetes mellitus without complication ([place])  Total Time spent with patient: 30 minutes  Past Psychiatric History: longstanding depression  Past Medical History:  Past Medical History:  Diagnosis Date   Depression    Diabetes mellitus without complication ([place])     Past Surgical History:  Procedure Laterality Date   INGUINAL HERNIA REPAIR     LUMBAR FUSION     Family History: History reviewed. No pertinent family history. Family Psychiatric  History: see previous Social History:  Social History   Substance and Sexual Activity  Alcohol Use No   Comment: denies use     Social History   Substance and Sexual Activity  Drug Use Yes   Types: Marijuana, Cocaine    Social History   Socioeconomic History   Marital status: Single    Spouse name: Not on file   Number of children: Not on file   Years of education: Not on file   Highest education level: Not on file  Occupational History   Not on file  Tobacco Use   Smoking status: Every Day    Packs/day: 0.50    Types: Cigarettes   Smokeless tobacco: Never  Vaping Use   Vaping Use: Never used  Substance and Sexual Activity   Alcohol use: No    Comment: denies use   Drug use: Yes    Types: Marijuana, Cocaine   Sexual activity: Never  Other Topics Concern   Not on file  Social History Narrative   Not on file   Social Determinants of Health   Financial Resource Strain: Not on file  Food Insecurity: Not on file  Transportation Needs: Not on file  Physical Activity: Not on file  Stress: Not on file  Social Connections: Not on file   Additional Social History:                          Sleep: Fair  Appetite:  Fair  Current Medications: Current Facility-Administered Medications  Medication Dose Route Frequency Provider Last Rate Last Admin   acetaminophen (TYLENOL) tablet 650 mg  650 mg Oral Q6H PRN Mathew Storck, Madie Reno, MD   650 mg at 06/19/21 0801   alum & mag hydroxide-simeth (MAALOX/MYLANTA) 200-200-20 MG/5ML suspension 30 mL  30 mL Oral Q4H PRN Analilia Geddis, Madie Reno, MD       feeding supplement (ENSURE ENLIVE / ENSURE PLUS) liquid 237 mL  237 mL Oral TID BM Isiac Breighner T, MD   237 mL at 06/22/21 1346   FLUoxetine (PROZAC) capsule 20 mg  20 mg Oral Daily Lenward Chancellor, MD   20 mg at 06/22/21 6063   hydrOXYzine (ATARAX/VISTARIL) tablet 10 mg  10 mg Oral BID PRN Krystalle Pilkington, Madie Reno, MD   10 mg at 06/21/21 2100   hydrOXYzine (ATARAX/VISTARIL) tablet 50 mg  50 mg Oral TID PRN Jaclin Finks, Madie Reno, MD       magnesium hydroxide (MILK OF MAGNESIA) suspension 30 mL  30 mL Oral Daily PRN Berneta Sconyers T, MD       metFORMIN (GLUCOPHAGE) tablet 500 mg  500 mg Oral BID WC Ezekeil Bethel, Madie Reno, MD   500 mg at 06/22/21 1629  mirtazapine (REMERON) tablet 7.5 mg  7.5 mg Oral QHS Khaila Velarde T, MD       multivitamin with minerals tablet 1 tablet  1 tablet Oral Daily Shabana Armentrout, Madie Reno, MD   1 tablet at 06/22/21 1610   traZODone (DESYREL) tablet 100 mg  100 mg Oral QHS PRN Bekah Igoe, Madie Reno, MD   100 mg at 06/21/21 2057    Lab Results: No results found for this or any previous visit (from the past 48 hour(s)).  Blood Alcohol level:  Lab Results  Component Value Date   ETH <10 06/17/2021   ETH <10 96/11/5407    Metabolic Disorder Labs: Lab Results  Component Value Date   HGBA1C 6.1 (H) 06/20/2021   MPG 128.37 06/20/2021   MPG 119.76 02/02/2018   No results found for: PROLACTIN Lab Results  Component Value Date   CHOL 235 (H) 02/02/2018   TRIG 146 02/02/2018   HDL 35 (L) 02/02/2018   CHOLHDL 6.7 02/02/2018   VLDL 29 02/02/2018   LDLCALC 171 (H) 02/02/2018   LDLCALC 153  (H) 11/05/2016    Physical Findings: AIMS:  , ,  ,  ,    CIWA:    COWS:     Musculoskeletal: Strength & Muscle Tone: within normal limits Gait & Station: normal Patient leans: N/A  Psychiatric Specialty Exam:  Presentation  General Appearance: Appropriate for Environment  Eye Contact:Good  Speech:Clear and Coherent  Speech Volume:Normal  Handedness:No data recorded  Mood and Affect  Mood:Anxious; Depressed  Affect:Congruent   Thought Process  Thought Processes:Coherent  Descriptions of Associations:Intact  Orientation:Full (Time, Place and Person)  Thought Content:Logical  History of Schizophrenia/Schizoaffective disorder:No  Duration of Psychotic Symptoms:No data recorded Hallucinations:No data recorded Ideas of Reference:None  Suicidal Thoughts:No data recorded Homicidal Thoughts:No data recorded  Sensorium  Memory:Immediate Good  Judgment:Good  Insight:Good   Executive Functions  Concentration:Fair  Attention Span:Fair  [place]  Language:Good   Psychomotor Activity  Psychomotor Activity:No data recorded  Assets  Assets:Resilience; Physical Health; Social Support; Housing; Catering manager; Desire for Improvement   Sleep  Sleep:No data recorded   Physical Exam: Physical Exam Vitals and nursing note reviewed.  Constitutional:      Appearance: Normal appearance.  HENT:     Head: Normocephalic and atraumatic.     Mouth/Throat:     Pharynx: Oropharynx is clear.  Eyes:     Pupils: Pupils are equal, round, and reactive to light.  Cardiovascular:     Rate and Rhythm: Normal rate and regular rhythm.  Pulmonary:     Effort: Pulmonary effort is normal.     Breath sounds: Normal breath sounds.  Abdominal:     General: Abdomen is flat.     Palpations: Abdomen is soft.  Musculoskeletal:        General: Normal range of motion.  Skin:    General: Skin is warm and dry.  Neurological:      General: No focal deficit present.     Mental Status: He is alert. Mental status is at baseline.  Psychiatric:        Mood and Affect: Mood normal.        Thought Content: Thought content normal.   Review of Systems  Constitutional: Negative.   HENT: Negative.    Eyes: Negative.   Respiratory: Negative.    Cardiovascular: Negative.   Gastrointestinal: Negative.   Musculoskeletal: Negative.   Skin: Negative.   Neurological: Negative.   Psychiatric/Behavioral:  Positive  for depression. Negative for hallucinations, substance abuse and suicidal ideas. The patient is nervous/anxious.   Blood pressure 111/74, pulse 97, temperature 98 F (36.7 C), temperature source Oral, resp. rate 18, height 5\' 10"  (1.778 m), weight 68 kg, SpO2 99 %. Body mass index is 21.52 kg/m.   Treatment Plan Summary: Medication management and Plan restart remeron low dose continue prozac. Urge group attendance  Alethia Berthold, MD 06/22/2021, 6:36 PM

## 2021-06-22 NOTE — Progress Notes (Signed)
Pt is A/Ox3, fully ambulatory and uses no assistive devices.  Pt denies any SI/HI stated he has never suffered from A/V hallucinations. Pt relayed to staff that he chronically suffers from depression.  Staff allowed pt time to voice feelings and concerns.  Pt then decided to walk around in the unit to help distract self.  Pt has been withdrawn and isolated to his room for the majority of the day, coming out for meals and attended one group.  Staff to continue to encourage pt to participate in tx goals to aid in the advancement towards d/c.  Cont to monitor as ordered.

## 2021-06-23 DIAGNOSIS — F332 Major depressive disorder, recurrent severe without psychotic features: Secondary | ICD-10-CM | POA: Diagnosis not present

## 2021-06-23 NOTE — Plan of Care (Signed)
  Problem: Group Participation Goal: STG - Patient will engage in groups without prompting or encouragement from LRT x3 group sessions within 5 recreation therapy group sessions Description: STG - Patient will engage in groups without prompting or encouragement from LRT x3 group sessions within 5 recreation therapy group sessions Outcome: Not Progressing   

## 2021-06-23 NOTE — [facility] Group Notes (Signed)
9:45 AM  Pt Refused to attend group  Pt req to let him sleep

## 2021-06-23 NOTE — Progress Notes (Signed)
Recreation Therapy Notes  Date: 06/23/2021  Time: 1:15pm   Location: Craft room   Behavioral response: N/A   Intervention Topic: Goals    Discussion/Intervention: Patient invited to attend group, patient refused.   Clinical Observations/Feedback:  Patient did not attend group.   Laymond Postle LRT/CTRS          Shawndell Varas 06/23/2021 4:05 PM

## 2021-06-23 NOTE — Progress Notes (Addendum)
Patient is minimal in interation. Pt voiced no complaints to this Probation officer. Pt denies SI, HI, and AVH. Pt is monitored q 15 minutes for safety.

## 2021-06-23 NOTE — Group Note (Signed)
[facility] LCSW Group Therapy Note   Group Date: 06/23/2021 Start Time: 1430 End Time: 1500  Type of Therapy/Topic:  Group Therapy:  Feelings about Diagnosis  Participation Level:  Active     Description of Group:    This group will allow patients to explore their thoughts and feelings about diagnoses they have received. Patients will be guided to explore their level of understanding and acceptance of these diagnoses. Facilitator will encourage patients to process their thoughts and feelings about the reactions of others to their diagnosis, and will guide patients in identifying ways to discuss their diagnosis with significant others in their lives. This group will be process-oriented, with patients participating in exploration of their own experiences as well as giving and receiving support and challenge from other group members.   Therapeutic Goals: 1. Patient will demonstrate understanding of diagnosis as evidence by identifying two or more symptoms of the disorder:  2. Patient will be able to express two feelings regarding the diagnosis 3. Patient will demonstrate ability to communicate their needs through discussion and/or role plays  Summary of Patient Progress:   Patient was present for the entirety of the group session. Patient was an active listener and participated in the topic of discussion. CSW met with patient individually regarding topic of discussion. Patient stated that since his mother's death in October 24, 2014, his level of depression has felt worse. He stated he used to be able to relate and find common support with his mother because she also had a MDD diagnosis. He stated he continues to feel groggy but has experienced less tearfulness since admission. He stated that he may be interested in participating in a support group moving forward, post discharge.       Therapeutic Modalities:   Cognitive Behavioral Therapy Brief Therapy Feelings Identification    Heaton Sarin A [place], LCSWA

## 2021-06-23 NOTE — Progress Notes (Signed)
[facility] MD Progress Note  06/23/2021 5:05 PM Edgar White  MRN:  [id number] Subjective: Follow-up 55 year old man with depression.  Patient says his mood is still depressed and down but not as bad as when he came in.  Denies acute suicidal ideation.  He continues to insist that he feels tired and sleepy during the daytime and to blame this on the tiny dose of mirtazapine that he is taking. Principal Problem: Severe recurrent major depression without psychotic features ([place]) Diagnosis: Principal Problem:   Severe recurrent major depression without psychotic features ([place]) Active Problems:   Diabetes mellitus without complication ([place])  Total Time spent with patient: 30 minutes  Past Psychiatric History: Past history of recurrent severe depression multiple prior hospitalizations and suicide attempts  Past Medical History:  Past Medical History:  Diagnosis Date   Depression    Diabetes mellitus without complication ([place])     Past Surgical History:  Procedure Laterality Date   INGUINAL HERNIA REPAIR     LUMBAR FUSION     Family History: History reviewed. No pertinent family history. Family Psychiatric  History: See previous Social History:  Social History   Substance and Sexual Activity  Alcohol Use No   Comment: denies use     Social History   Substance and Sexual Activity  Drug Use Yes   Types: Marijuana, Cocaine    Social History   Socioeconomic History   Marital status: Single    Spouse name: Not on file   Number of children: Not on file   Years of education: Not on file   Highest education level: Not on file  Occupational History   Not on file  Tobacco Use   Smoking status: Every Day    Packs/day: 0.50    Types: Cigarettes   Smokeless tobacco: Never  Vaping Use   Vaping Use: Never used  Substance and Sexual Activity   Alcohol use: No    Comment: denies use   Drug use: Yes    Types: Marijuana, Cocaine   Sexual activity: Never  Other Topics Concern    Not on file  Social History Narrative   Not on file   Social Determinants of Health   Financial Resource Strain: Not on file  Food Insecurity: Not on file  Transportation Needs: Not on file  Physical Activity: Not on file  Stress: Not on file  Social Connections: Not on file   Additional Social History:                         Sleep: Fair  Appetite:  Fair  Current Medications: Current Facility-Administered Medications  Medication Dose Route Frequency Provider Last Rate Last Admin   acetaminophen (TYLENOL) tablet 650 mg  650 mg Oral Q6H PRN Bradleigh Sonnen T, MD   650 mg at 06/19/21 0801   alum & mag hydroxide-simeth (MAALOX/MYLANTA) 200-200-20 MG/5ML suspension 30 mL  30 mL Oral Q4H PRN Marilou Barnfield T, MD       feeding supplement (ENSURE ENLIVE / ENSURE PLUS) liquid 237 mL  237 mL Oral TID BM Ulla Mckiernan T, MD   237 mL at 06/23/21 1456   FLUoxetine (PROZAC) capsule 20 mg  20 mg Oral Daily Lenward Chancellor, MD   20 mg at 06/23/21 1033   hydrOXYzine (ATARAX/VISTARIL) tablet 10 mg  10 mg Oral BID PRN Sherran Margolis, Madie Reno, MD   10 mg at 06/21/21 2100   hydrOXYzine (ATARAX/VISTARIL) tablet 50 mg  50 mg Oral  TID PRN Jden Want, Madie Reno, MD       magnesium hydroxide (MILK OF MAGNESIA) suspension 30 mL  30 mL Oral Daily PRN Graviela Nodal T, MD       metFORMIN (GLUCOPHAGE) tablet 500 mg  500 mg Oral BID WC Traeh Milroy T, MD   500 mg at 06/23/21 1647   mirtazapine (REMERON) tablet 7.5 mg  7.5 mg Oral QHS Nyoka Alcoser T, MD   7.5 mg at 06/22/21 2126   multivitamin with minerals tablet 1 tablet  1 tablet Oral Daily Trent Gabler, Madie Reno, MD   1 tablet at 06/23/21 1033   traZODone (DESYREL) tablet 100 mg  100 mg Oral QHS PRN Mellina Benison, Madie Reno, MD   100 mg at 06/21/21 2057    Lab Results: No results found for this or any previous visit (from the past 48 hour(s)).  Blood Alcohol level:  Lab Results  Component Value Date   ETH <10 06/17/2021   ETH <10 44/31/5400    Metabolic Disorder  Labs: Lab Results  Component Value Date   HGBA1C 6.1 (H) 06/20/2021   MPG 128.37 06/20/2021   MPG 119.76 02/02/2018   No results found for: PROLACTIN Lab Results  Component Value Date   CHOL 235 (H) 02/02/2018   TRIG 146 02/02/2018   HDL 35 (L) 02/02/2018   CHOLHDL 6.7 02/02/2018   VLDL 29 02/02/2018   LDLCALC 171 (H) 02/02/2018   LDLCALC 153 (H) 11/05/2016    Physical Findings: AIMS:  , ,  ,  ,    CIWA:    COWS:     Musculoskeletal: Strength & Muscle Tone: within normal limits Gait & Station: normal Patient leans: N/A  Psychiatric Specialty Exam:  Presentation  General Appearance: Appropriate for Environment  Eye Contact:Good  Speech:Clear and Coherent  Speech Volume:Normal  Handedness:No data recorded  Mood and Affect  Mood:Anxious; Depressed  Affect:Congruent   Thought Process  Thought Processes:Coherent  Descriptions of Associations:Intact  Orientation:Full (Time, Place and Person)  Thought Content:Logical  History of Schizophrenia/Schizoaffective disorder:No  Duration of Psychotic Symptoms:No data recorded Hallucinations:No data recorded Ideas of Reference:None  Suicidal Thoughts:No data recorded Homicidal Thoughts:No data recorded  Sensorium  Memory:Immediate Good  Judgment:Good  Insight:Good   Executive Functions  Concentration:Fair  Attention Span:Fair  [place]  Language:Good   Psychomotor Activity  Psychomotor Activity:No data recorded  Assets  Assets:Resilience; Physical Health; Social Support; Housing; Catering manager; Desire for Improvement   Sleep  Sleep:No data recorded   Physical Exam: Physical Exam Vitals and nursing note reviewed.  Constitutional:      Appearance: Normal appearance.  HENT:     Head: Normocephalic and atraumatic.     Mouth/Throat:     Pharynx: Oropharynx is clear.  Eyes:     Pupils: Pupils are equal, round, and reactive to light.   Cardiovascular:     Rate and Rhythm: Normal rate and regular rhythm.  Pulmonary:     Effort: Pulmonary effort is normal.     Breath sounds: Normal breath sounds.  Abdominal:     General: Abdomen is flat.     Palpations: Abdomen is soft.  Musculoskeletal:        General: Normal range of motion.  Skin:    General: Skin is warm and dry.  Neurological:     General: No focal deficit present.     Mental Status: He is alert. Mental status is at baseline.  Psychiatric:        Mood  and Affect: Mood normal.        Thought Content: Thought content normal.   Review of Systems  Constitutional:  Positive for malaise/fatigue.  HENT: Negative.    Eyes: Negative.   Respiratory: Negative.    Cardiovascular: Negative.   Gastrointestinal: Negative.   Musculoskeletal: Negative.   Skin: Negative.   Neurological: Negative.   Psychiatric/Behavioral:  Positive for depression. Negative for hallucinations, substance abuse and suicidal ideas. The patient is nervous/anxious.   Blood pressure 126/73, pulse 89, temperature 98 F (36.7 C), temperature source Oral, resp. rate 20, height 5\' 10"  (1.778 m), weight 68 kg, SpO2 98 %. Body mass index is 21.52 kg/m.   Treatment Plan Summary: Medication management and Plan affect slightly dysphoric but patient is neatly dressed interacting with others at times.  After talking about the pros and cons of various options with medicines he prefers to continue the small 7.5 mg dose of mirtazapine along with the fluoxetine.  Encourage patient to be up out of bed interacting on the unit.  Reassess tomorrow.  Alethia Berthold, MD 06/23/2021, 5:05 PM

## 2021-06-24 DIAGNOSIS — F332 Major depressive disorder, recurrent severe without psychotic features: Secondary | ICD-10-CM | POA: Diagnosis not present

## 2021-06-24 MED ORDER — FLUOXETINE HCL 20 MG PO CAPS
40.0000 mg | ORAL_CAPSULE | Freq: Every day | ORAL | Status: DC
  Administered 2021-06-25 – 2021-07-01 (×7): 40 mg via ORAL
  Filled 2021-06-24 (×7): qty 2

## 2021-06-24 NOTE — Progress Notes (Signed)
Patient came to the dayroom when asked to at around 10am. He stayed to eat lunch and returned to his room.

## 2021-06-24 NOTE — Progress Notes (Signed)
[facility] MD Progress Note  06/24/2021 10:47 AM Edgar White  MRN:  [id number] Subjective: Follow-up 55 year old man with history of recurrent depression.  Patient seen and chart reviewed.  Patient continues to endorse being depressed and in fact says he feels "about the same" as he did when he came into the hospital.  He is getting up out of bed more today and yesterday and attending more activities.  Does not have any new physical complaints.  Says that he slept okay last night.  Still talking about feeling a little bit "groggy" in the morning.  Also mentions that he is having "brain shocks" today. Principal Problem: Severe recurrent major depression without psychotic features ([place]) Diagnosis: Principal Problem:   Severe recurrent major depression without psychotic features ([place]) Active Problems:   Diabetes mellitus without complication ([place])  Total Time spent with patient: 30 minutes  Past Psychiatric History: Past history of recurrent severe depression with multiple medications and treatments tried.  Past Medical History:  Past Medical History:  Diagnosis Date   Depression    Diabetes mellitus without complication ([place])     Past Surgical History:  Procedure Laterality Date   INGUINAL HERNIA REPAIR     LUMBAR FUSION     Family History: History reviewed. No pertinent family history. Family Psychiatric  History: None reported Social History:  Social History   Substance and Sexual Activity  Alcohol Use No   Comment: denies use     Social History   Substance and Sexual Activity  Drug Use Yes   Types: Marijuana, Cocaine    Social History   Socioeconomic History   Marital status: Single    Spouse name: Not on file   Number of children: Not on file   Years of education: Not on file   Highest education level: Not on file  Occupational History   Not on file  Tobacco Use   Smoking status: Every Day    Packs/day: 0.50    Types: Cigarettes   Smokeless tobacco: Never   Vaping Use   Vaping Use: Never used  Substance and Sexual Activity   Alcohol use: No    Comment: denies use   Drug use: Yes    Types: Marijuana, Cocaine   Sexual activity: Never  Other Topics Concern   Not on file  Social History Narrative   Not on file   Social Determinants of Health   Financial Resource Strain: Not on file  Food Insecurity: Not on file  Transportation Needs: Not on file  Physical Activity: Not on file  Stress: Not on file  Social Connections: Not on file   Additional Social History:                         Sleep: Fair  Appetite:  Fair  Current Medications: Current Facility-Administered Medications  Medication Dose Route Frequency Provider Last Rate Last Admin   acetaminophen (TYLENOL) tablet 650 mg  650 mg Oral Q6H PRN Breezie Micucci T, MD   650 mg at 06/19/21 0801   alum & mag hydroxide-simeth (MAALOX/MYLANTA) 200-200-20 MG/5ML suspension 30 mL  30 mL Oral Q4H PRN Schneider Warchol T, MD       feeding supplement (ENSURE ENLIVE / ENSURE PLUS) liquid 237 mL  237 mL Oral TID BM Kai Railsback T, MD   237 mL at 06/24/21 0941   FLUoxetine (PROZAC) capsule 20 mg  20 mg Oral Daily Lenward Chancellor, MD   20 mg at 06/24/21  9485   hydrOXYzine (ATARAX/VISTARIL) tablet 10 mg  10 mg Oral BID PRN Nyashia Raney, Madie Reno, MD   10 mg at 06/21/21 2100   hydrOXYzine (ATARAX/VISTARIL) tablet 50 mg  50 mg Oral TID PRN Chava Dulac, Madie Reno, MD       magnesium hydroxide (MILK OF MAGNESIA) suspension 30 mL  30 mL Oral Daily PRN Kia Stavros T, MD       metFORMIN (GLUCOPHAGE) tablet 500 mg  500 mg Oral BID WC Mylah Baynes T, MD   500 mg at 06/24/21 0941   mirtazapine (REMERON) tablet 7.5 mg  7.5 mg Oral QHS Kamylle Axelson T, MD   7.5 mg at 06/23/21 2042   multivitamin with minerals tablet 1 tablet  1 tablet Oral Daily Aneth Schlagel, Madie Reno, MD   1 tablet at 06/24/21 0941   traZODone (DESYREL) tablet 100 mg  100 mg Oral QHS PRN Marlaina Coburn, Madie Reno, MD   100 mg at 06/21/21 2057    Lab  Results: No results found for this or any previous visit (from the past 48 hour(s)).  Blood Alcohol level:  Lab Results  Component Value Date   ETH <10 06/17/2021   ETH <10 46/27/0350    Metabolic Disorder Labs: Lab Results  Component Value Date   HGBA1C 6.1 (H) 06/20/2021   MPG 128.37 06/20/2021   MPG 119.76 02/02/2018   No results found for: PROLACTIN Lab Results  Component Value Date   CHOL 235 (H) 02/02/2018   TRIG 146 02/02/2018   HDL 35 (L) 02/02/2018   CHOLHDL 6.7 02/02/2018   VLDL 29 02/02/2018   LDLCALC 171 (H) 02/02/2018   LDLCALC 153 (H) 11/05/2016    Physical Findings: AIMS:  , ,  ,  ,    CIWA:    COWS:     Musculoskeletal: Strength & Muscle Tone: within normal limits Gait & Station: normal Patient leans: N/A  Psychiatric Specialty Exam:  Presentation  General Appearance: Appropriate for Environment  Eye Contact:Good  Speech:Clear and Coherent  Speech Volume:Normal  Handedness:No data recorded  Mood and Affect  Mood:Anxious; Depressed  Affect:Congruent   Thought Process  Thought Processes:Coherent  Descriptions of Associations:Intact  Orientation:Full (Time, Place and Person)  Thought Content:Logical  History of Schizophrenia/Schizoaffective disorder:No  Duration of Psychotic Symptoms:No data recorded Hallucinations:No data recorded Ideas of Reference:None  Suicidal Thoughts:No data recorded Homicidal Thoughts:No data recorded  Sensorium  Memory:Immediate Good  Judgment:Good  Insight:Good   Executive Functions  Concentration:Fair  Attention Span:Fair  [place]  Language:Good   Psychomotor Activity  Psychomotor Activity:No data recorded  Assets  Assets:Resilience; Physical Health; Social Support; Housing; Catering manager; Desire for Improvement   Sleep  Sleep:No data recorded   Physical Exam: Physical Exam Vitals and nursing note reviewed.  Constitutional:       Appearance: Normal appearance.  HENT:     Head: Normocephalic and atraumatic.     Mouth/Throat:     Pharynx: Oropharynx is clear.  Eyes:     Pupils: Pupils are equal, round, and reactive to light.  Cardiovascular:     Rate and Rhythm: Normal rate and regular rhythm.  Pulmonary:     Effort: Pulmonary effort is normal.     Breath sounds: Normal breath sounds.  Abdominal:     General: Abdomen is flat.     Palpations: Abdomen is soft.  Musculoskeletal:        General: Normal range of motion.  Skin:    General: Skin is warm and  dry.  Neurological:     General: No focal deficit present.     Mental Status: He is alert. Mental status is at baseline.  Psychiatric:        Attention and Perception: Attention normal.        Mood and Affect: Mood is depressed. Affect is blunt.        Speech: Speech normal.        Thought Content: Thought content includes suicidal ideation. Thought content does not include suicidal plan.        Cognition and Memory: Cognition normal.   Review of Systems  Constitutional: Negative.   HENT: Negative.    Eyes: Negative.   Respiratory: Negative.    Cardiovascular: Negative.   Gastrointestinal: Negative.   Musculoskeletal: Negative.   Skin: Negative.   Neurological: Negative.   Psychiatric/Behavioral:  Positive for depression and suicidal ideas. Negative for hallucinations and substance abuse. The patient is nervous/anxious.   Blood pressure 122/74, pulse 87, temperature 97.7 F (36.5 C), temperature source Oral, resp. rate 18, height 5\' 10"  (1.778 m), weight 68 kg, SpO2 99 %. Body mass index is 21.52 kg/m.   Treatment Plan Summary: Medication management and Plan reassured patient that the "brain shocks" are not a likely side effect of his medicine but may just be from coming off the [place].  I suggested we can increase the dose of fluoxetine to 40 mg a day which I think is probably going to be more like an appropriate dose for him anyway.  He still  wants to continue the mirtazapine but only at the 7.5 mg dose.  Encourage patient in being up out of bed interacting with others and tried to engage him in some conversation about future planning and how he wants to get back to work but at the same time resents his family for "pressuring" him to get a job.  Alethia Berthold, MD 06/24/2021, 10:47 AM

## 2021-06-24 NOTE — Progress Notes (Signed)
Recreation Therapy Notes    Date: 06/24/2021   Time: 1:15 pm    Location: [address]    Behavioral response: Appropriate   Intervention Topic: Leisure    Discussion/Intervention:  Group content today was focused on leisure. The group defined what leisure is and some positive leisure activities they participate in. Individuals identified the difference between good and bad leisure. Participants expressed how they feel after participating in the leisure of their choice. The group discussed how they go about picking a leisure activity and if others are involved in their leisure activities. The patient stated how many leisure activities they have to choose from and reasons why it is important to have leisure time. Individuals participated in the intervention "Exploration of Leisure" where they had a chance to identify new leisure activities as well as benefits of leisure. Clinical Observations/Feedback: Patient came to group late due to unknown reasons. Individual was social with staff and peers while participating in the intervention. Adelle Zachar LRT/CTRS         Mack Thurmon 06/24/2021 3:02 PM

## 2021-06-24 NOTE — Progress Notes (Signed)
Patient is calm and cooperative with assessment. He is minimal in interaction. He denies SI, HI, and AVH. He also denies pain and other physical problems. He states his sleep was "on and off" throughout the night. Patient does not want to get up for breakfast and isolates to his room, but states that he will get up later in the day and take his medications. Patient remains safe on the unit at this time.

## 2021-06-24 NOTE — Group Note (Signed)
[facility] LCSW Group Therapy Note   Group Date: 06/24/2021 Start Time: 1400 End Time: 1430   Type of Therapy/Topic:  Group Therapy:  Balance in Life  Participation Level:  Active   Description of Group:    This group will address the concept of balance and how it feels and looks when one is unbalanced. Patients will be encouraged to process areas in their lives that are out of balance, and identify reasons for remaining unbalanced. Facilitators will guide patients utilizing problem- solving interventions to address and correct the stressor making their life unbalanced. Understanding and applying boundaries will be explored and addressed for obtaining  and maintaining a balanced life. Patients will be encouraged to explore ways to assertively make their unbalanced needs known to significant others in their lives, using other group members and facilitator for support and feedback.  Therapeutic Goals: Patient will identify two or more emotions or situations they have that consume much of in their lives. Patient will identify signs/triggers that life has become out of balance:  Patient will identify two ways to set boundaries in order to achieve balance in their lives:  Patient will demonstrate ability to communicate their needs through discussion and/or role plays  Summary of Patient Progress:   Patient was present for the entirety of the group session. Patient was an active listener and participated in the topic of discussion, CSW met with pt individually. 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 He stated that he did not have many coping skills aside from medication to assist with his depression and stated he would like to learn more coping strategies. He also stated he was less tearful which has been good.       Therapeutic Modalities:   Cognitive Behavioral Therapy Solution-Focused  Therapy Assertiveness Training   [place], LCSWA

## 2021-06-25 DIAGNOSIS — F332 Major depressive disorder, recurrent severe without psychotic features: Secondary | ICD-10-CM | POA: Diagnosis not present

## 2021-06-25 MED ORDER — LOPERAMIDE HCL 2 MG PO CAPS
2.0000 mg | ORAL_CAPSULE | ORAL | Status: DC | PRN
  Filled 2021-06-25: qty 1

## 2021-06-25 MED ORDER — ONDANSETRON 4 MG PO TBDP
8.0000 mg | ORAL_TABLET | Freq: Three times a day (TID) | ORAL | Status: DC | PRN
  Administered 2021-06-25 – 2021-07-08 (×4): 8 mg via ORAL
  Filled 2021-06-25 (×4): qty 2

## 2021-06-25 NOTE — Progress Notes (Signed)
Recreation Therapy Notes   Date: 06/25/2021  Time: 1:00 pm   Location: Day room   Behavioral response: Appropriate  Intervention Topic: Social skills   Discussion/Intervention:  Group content on today was focused on social skills. The group defined social skills and identified ways they use social skills. Patients expressed what obstacles they face when trying to be social. Participants described the importance of social skills. The group listed ways to improve social skills and reasons to improve social skills. Individuals had an opportunity to learn new and improve social skills as well as identify their weaknesses. Clinical Observations/Feedback: Patient came to group and explained that he mainly talks to others face to face when he is social. He rated his social skills a 4/10 stating that it is sometimes hard to be the first one to talk. Individual was social with peers and staff while participating in the intervention. Maeson Lourenco LRT/CTRS         Mertha Clyatt 06/25/2021 3:15 PM

## 2021-06-25 NOTE — Progress Notes (Signed)
Patient presents with sad, flat affect. Reports he felt a little depressed today. Also reports he feels like his brain is being zapped when walking and blinking his eyes. Patient pleasant and cooperative. Noted in milieu interacting with peers. Snack offered and excepted. Patient given prn for sleep, only wants half dose. Patient reports did not rest well on last pm due to heartburn. Pt reports heartburn tonight. Prn given with good relief.  Encouragement and support provided. Safety checks maintained. Medications given as prescribed. Patient receptive and remains safe on unit with q 15 min checks.

## 2021-06-25 NOTE — Plan of Care (Signed)
  Problem: Education: Goal: Knowledge of the prescribed therapeutic regimen will improve Outcome: Progressing   Problem: Activity: Goal: Interest or engagement in leisure activities will improve Outcome: Progressing   Problem: Coping: Goal: Will verbalize feelings Outcome: Progressing   Problem: Self-Concept: Goal: Level of anxiety will decrease Outcome: Progressing

## 2021-06-25 NOTE — Progress Notes (Signed)
Patient was cooperative with treatment, he spent most of the evening visiting with his sitter in the dayroom. He had minimal interaction with staff, he denies SI, HI & AVH. No behavioral issues to report on shift at this time. Writer will continue to provide support as needed.

## 2021-06-25 NOTE — Progress Notes (Signed)
Patient denies SI, HI, and AVH. He endorses depression and says that he has just been "feeling off". He says it feels like a "brain zap", and attributes that feeling to getting adjusted to his new medications. Patient says he does not want to get up for breakfast but that he will get up later. He states that he had some difficulty sleeping due to heartburn and hiccups last night. Patient was reminded to sit up after meals and that he could get Maalox for indigestion if he needed it. Patient is compliant with medications. Support and education provided. Patient remains safe on the unit at this time.

## 2021-06-25 NOTE — Progress Notes (Signed)
[facility] MD Progress Note  06/25/2021 10:47 AM Edgar White  MRN:  [id number]  CC "Stomach hurts today."  Subjective:  Edgar White is a 55 y.o. male patient admitted with suicidal thoughts, worsening depression. No acute events overnight, medication compliant, ADLs intact. Patient seen one-on-one today. He reports poor sleep overnight due to hiccups and heart burn. He did not eat breakfast today. Reporting stomach aches with nausea, no emesis. States he feels just as depressed as the day of admission. Passive SI. Denies HI/AH/VH. Denies medication side effects.  Principal Problem: Severe recurrent major depression without psychotic features ([place]) Diagnosis: Principal Problem:   Severe recurrent major depression without psychotic features ([place]) Active Problems:   Diabetes mellitus without complication ([place])  Total Time spent with patient: 20 minutes  Past Psychiatric History: See H&P  Past Medical History:  Past Medical History:  Diagnosis Date   Depression    Diabetes mellitus without complication ([place])     Past Surgical History:  Procedure Laterality Date   INGUINAL HERNIA REPAIR     LUMBAR FUSION     Family History: History reviewed. No pertinent family history. Family Psychiatric  History: See H&P Social History:  Social History   Substance and Sexual Activity  Alcohol Use No   Comment: denies use     Social History   Substance and Sexual Activity  Drug Use Yes   Types: Marijuana, Cocaine    Social History   Socioeconomic History   Marital status: Single    Spouse name: Not on file   Number of children: Not on file   Years of education: Not on file   Highest education level: Not on file  Occupational History   Not on file  Tobacco Use   Smoking status: Every Day    Packs/day: 0.50    Types: Cigarettes   Smokeless tobacco: Never  Vaping Use   Vaping Use: Never used  Substance and Sexual Activity   Alcohol use: No    Comment: denies use   Drug  use: Yes    Types: Marijuana, Cocaine   Sexual activity: Never  Other Topics Concern   Not on file  Social History Narrative   Not on file   Social Determinants of Health   Financial Resource Strain: Not on file  Food Insecurity: Not on file  Transportation Needs: Not on file  Physical Activity: Not on file  Stress: Not on file  Social Connections: Not on file   Additional Social History:                         Sleep: Poor  Appetite:  Poor  Current Medications: Current Facility-Administered Medications  Medication Dose Route Frequency Provider Last Rate Last Admin   acetaminophen (TYLENOL) tablet 650 mg  650 mg Oral Q6H PRN Clapacs, John T, MD   650 mg at 06/24/21 1201   alum & mag hydroxide-simeth (MAALOX/MYLANTA) 200-200-20 MG/5ML suspension 30 mL  30 mL Oral Q4H PRN Clapacs, John T, MD       feeding supplement (ENSURE ENLIVE / ENSURE PLUS) liquid 237 mL  237 mL Oral TID BM Clapacs, John T, MD   237 mL at 06/25/21 0913   FLUoxetine (PROZAC) capsule 40 mg  40 mg Oral Daily Clapacs, Madie Reno, MD   40 mg at 06/25/21 0913   hydrOXYzine (ATARAX/VISTARIL) tablet 10 mg  10 mg Oral BID PRN Clapacs, Madie Reno, MD   10 mg at 06/21/21  2100   hydrOXYzine (ATARAX/VISTARIL) tablet 50 mg  50 mg Oral TID PRN Clapacs, Madie Reno, MD       loperamide (IMODIUM) capsule 2 mg  2 mg Oral Q4H PRN Salley Scarlet, MD       magnesium hydroxide (MILK OF MAGNESIA) suspension 30 mL  30 mL Oral Daily PRN Clapacs, John T, MD       metFORMIN (GLUCOPHAGE) tablet 500 mg  500 mg Oral BID WC Clapacs, John T, MD   500 mg at 06/25/21 0913   mirtazapine (REMERON) tablet 7.5 mg  7.5 mg Oral QHS Clapacs, John T, MD   7.5 mg at 06/24/21 2120   multivitamin with minerals tablet 1 tablet  1 tablet Oral Daily Clapacs, Madie Reno, MD   1 tablet at 06/25/21 0913   ondansetron (ZOFRAN-ODT) disintegrating tablet 8 mg  8 mg Oral Q8H PRN Salley Scarlet, MD       traZODone (DESYREL) tablet 100 mg  100 mg Oral QHS PRN  Clapacs, Madie Reno, MD   100 mg at 06/21/21 2057    Lab Results: No results found for this or any previous visit (from the past 48 hour(s)).  Blood Alcohol level:  Lab Results  Component Value Date   ETH <10 06/17/2021   ETH <10 54/62/7035    Metabolic Disorder Labs: Lab Results  Component Value Date   HGBA1C 6.1 (H) 06/20/2021   MPG 128.37 06/20/2021   MPG 119.76 02/02/2018   No results found for: PROLACTIN Lab Results  Component Value Date   CHOL 235 (H) 02/02/2018   TRIG 146 02/02/2018   HDL 35 (L) 02/02/2018   CHOLHDL 6.7 02/02/2018   VLDL 29 02/02/2018   LDLCALC 171 (H) 02/02/2018   LDLCALC 153 (H) 11/05/2016    Physical Findings: AIMS:  , ,  ,  ,    CIWA:    COWS:     Musculoskeletal: Strength & Muscle Tone: within normal limits Gait & Station: normal Patient leans: N/A  Psychiatric Specialty Exam:  Presentation  General Appearance: Casual  Eye Contact:Fair  Speech:Clear and Coherent  Speech Volume:Normal  Handedness:Right  Mood and Affect  Mood:Anxious; Depressed  Affect:Congruent   Thought Process  Thought Processes:Coherent  Descriptions of Associations:Intact  Orientation:Full (Time, Place and Person)  Thought Content:Logical  History of Schizophrenia/Schizoaffective disorder:No  Duration of Psychotic Symptoms:No data recorded Hallucinations:Hallucinations: None Ideas of Reference:None  Suicidal Thoughts:Suicidal Thoughts: Yes, Passive Homicidal Thoughts:Homicidal Thoughts: No  Sensorium  Memory:Immediate Fair; Recent Fair; Remote Fair  Judgment:Intact  Insight:Good   Executive Functions  Concentration:Fair  Attention Span:Fair  [place]   Psychomotor Activity  Psychomotor Activity:Psychomotor Activity: Decreased  Assets  Assets:Communication Skills; Desire for Improvement; Financial Resources/Insurance; Housing; Resilience   Sleep  Sleep:Sleep: Good Number of  Hours of Sleep: 8   Physical Exam: Physical Exam ROS Blood pressure 123/75, pulse 78, temperature 97.7 F (36.5 C), temperature source Oral, resp. rate 18, height 5\' 10"  (1.778 m), weight 68 kg, SpO2 96 %. Body mass index is 21.52 kg/m.   Treatment Plan Summary: Daily contact with patient to assess and evaluate symptoms and progress in treatment and Medication management 55 year old male presenting for worsening depression and SI. Continues to endorse depression and passive SI. Continue medications as above. Supportive psychotherapy.   Salley Scarlet, MD 06/25/2021, 10:47 AM

## 2021-06-25 NOTE — Progress Notes (Addendum)
Recreation Therapy Notes   Date: 06/25/2021  Time: 3:30pm  Location: Courtyard   Behavioral response: Appropriate  Group Type: Leisure  Participation level: Active  Communication: Patient was social with peers and staff.  Comments: N/A  Ahnaf Caponi LRT/CTRS        Nero Sawatzky 06/25/2021 4:33 PM

## 2021-06-26 DIAGNOSIS — F332 Major depressive disorder, recurrent severe without psychotic features: Secondary | ICD-10-CM | POA: Diagnosis not present

## 2021-06-26 NOTE — Progress Notes (Signed)
Pt took morning medication w/o complaints.  Pt continues to stay in bed.  Staff had to bring medication as well as nutritional supplement to pt in room.  Staff encouraged pt to come out of room and socialize with peers and staff.  Cont to monitor as ordered

## 2021-06-26 NOTE — Group Note (Signed)
LCSW Group Therapy Note  Group Date: 06/26/2021 Start Time: 1330 End Time: 1410   Type of Therapy and Topic:  Group Therapy - Healthy vs Unhealthy Coping Skills  Participation Level:  Minimal   Description of Group The focus of this group was to determine what unhealthy coping techniques typically are used by group members and what healthy coping techniques would be helpful in coping with various problems. Patients were guided in becoming aware of the differences between healthy and unhealthy coping techniques. Patients were asked to identify 2-3 healthy coping skills they would like to learn to use more effectively.  Therapeutic Goals Patients learned that coping is what human beings do all day long to deal with various situations in their lives Patients defined and discussed healthy vs unhealthy coping techniques Patients identified their preferred coping techniques and identified whether these were healthy or unhealthy Patients determined 2-3 healthy coping skills they would like to become more familiar with and use more often. Patients provided support and ideas to each other   Summary of Patient Progress: Patient presented a few minutes after the start of group. Patient presented with a flat affect. Patient participated only when prompted, but remained engaged listening to other group members share. Patient shared how sleeping too much is an unhealthy coping mechanism for him. Patient stated that he finds himself spending too much time in bed and has noticed that he has engaged in this same behavior while hospitalized. Patient identified exercise as a healthy coping skill and committed to going on walks in the future. Patient shared how he reconnected with a friend while hospitalized.   Therapeutic Modalities Cognitive Behavioral Therapy Motivational Interviewing  Berniece Salines, Latanya Presser 06/26/2021  4:08 PM

## 2021-06-26 NOTE — Progress Notes (Signed)
Patient is calm and cooperative. Alert and oriented x4. Pt complained of anxiety to this Probation officer. Pt did not want to take antianxiety PRN. Pt was given alternative activity (word search) to ease anxiety. Pt is medication compliant. Pt denies SI, HI, and AVH at this time. Pt monitored q 15 minutes for safety.

## 2021-06-26 NOTE — Progress Notes (Signed)
Uneventful night. Sleep, eyes closed in no distress most of shift.

## 2021-06-26 NOTE — Progress Notes (Signed)
[facility] MD Progress Note  06/26/2021 10:42 AM Edgar White  MRN:  [id number]  CC "Little better"  Subjective:  Edgar White is a 55 y.o. male patient admitted with suicidal thoughts, worsening depression. No acute events overnight, medication compliant, ADLs intact. Patient seen one-on-one today. Stomach pain and nausea has resolved since yesterday. He denies SI/HI/AH/VH, but feels he is still depressed. Encouraged behavioral activation, and out of bed activities.   Principal Problem: Severe recurrent major depression without psychotic features ([place]) Diagnosis: Principal Problem:   Severe recurrent major depression without psychotic features ([place]) Active Problems:   Diabetes mellitus without complication ([place])  Total Time spent with patient: 20 minutes  Past Psychiatric History: See H&P  Past Medical History:  Past Medical History:  Diagnosis Date   Depression    Diabetes mellitus without complication ([place])     Past Surgical History:  Procedure Laterality Date   INGUINAL HERNIA REPAIR     LUMBAR FUSION     Family History: History reviewed. No pertinent family history. Family Psychiatric  History: See H&P Social History:  Social History   Substance and Sexual Activity  Alcohol Use No   Comment: denies use     Social History   Substance and Sexual Activity  Drug Use Yes   Types: Marijuana, Cocaine    Social History   Socioeconomic History   Marital status: Single    Spouse name: Not on file   Number of children: Not on file   Years of education: Not on file   Highest education level: Not on file  Occupational History   Not on file  Tobacco Use   Smoking status: Every Day    Packs/day: 0.50    Types: Cigarettes   Smokeless tobacco: Never  Vaping Use   Vaping Use: Never used  Substance and Sexual Activity   Alcohol use: No    Comment: denies use   Drug use: Yes    Types: Marijuana, Cocaine   Sexual activity: Never  Other Topics Concern   Not on  file  Social History Narrative   Not on file   Social Determinants of Health   Financial Resource Strain: Not on file  Food Insecurity: Not on file  Transportation Needs: Not on file  Physical Activity: Not on file  Stress: Not on file  Social Connections: Not on file   Additional Social History:    Sleep: Good  Appetite:  Fair  Current Medications: Current Facility-Administered Medications  Medication Dose Route Frequency Provider Last Rate Last Admin   acetaminophen (TYLENOL) tablet 650 mg  650 mg Oral Q6H PRN Clapacs, John T, MD   650 mg at 06/24/21 1201   alum & mag hydroxide-simeth (MAALOX/MYLANTA) 200-200-20 MG/5ML suspension 30 mL  30 mL Oral Q4H PRN Clapacs, John T, MD   30 mL at 06/25/21 2102   feeding supplement (ENSURE ENLIVE / ENSURE PLUS) liquid 237 mL  237 mL Oral TID BM Clapacs, John T, MD   237 mL at 06/26/21 0920   FLUoxetine (PROZAC) capsule 40 mg  40 mg Oral Daily Clapacs, John T, MD   40 mg at 06/26/21 0920   hydrOXYzine (ATARAX/VISTARIL) tablet 10 mg  10 mg Oral BID PRN Clapacs, John T, MD   10 mg at 06/21/21 2100   hydrOXYzine (ATARAX/VISTARIL) tablet 50 mg  50 mg Oral TID PRN Clapacs, Madie Reno, MD       loperamide (IMODIUM) capsule 2 mg  2 mg Oral Q4H PRN Domingo Cocking,  Hedwig Morton, MD       magnesium hydroxide (MILK OF MAGNESIA) suspension 30 mL  30 mL Oral Daily PRN Clapacs, John T, MD       metFORMIN (GLUCOPHAGE) tablet 500 mg  500 mg Oral BID WC Clapacs, John T, MD   500 mg at 06/26/21 0920   mirtazapine (REMERON) tablet 7.5 mg  7.5 mg Oral QHS Clapacs, John T, MD   7.5 mg at 06/25/21 2057   multivitamin with minerals tablet 1 tablet  1 tablet Oral Daily Clapacs, Madie Reno, MD   1 tablet at 06/26/21 0921   ondansetron (ZOFRAN-ODT) disintegrating tablet 8 mg  8 mg Oral Q8H PRN Salley Scarlet, MD   8 mg at 06/25/21 1059   traZODone (DESYREL) tablet 100 mg  100 mg Oral QHS PRN Clapacs, Madie Reno, MD   50 mg at 06/25/21 2057    Lab Results: No results found for this or  any previous visit (from the past 48 hour(s)).  Blood Alcohol level:  Lab Results  Component Value Date   ETH <10 06/17/2021   ETH <10 93/26/7124    Metabolic Disorder Labs: Lab Results  Component Value Date   HGBA1C 6.1 (H) 06/20/2021   MPG 128.37 06/20/2021   MPG 119.76 02/02/2018   No results found for: PROLACTIN Lab Results  Component Value Date   CHOL 235 (H) 02/02/2018   TRIG 146 02/02/2018   HDL 35 (L) 02/02/2018   CHOLHDL 6.7 02/02/2018   VLDL 29 02/02/2018   LDLCALC 171 (H) 02/02/2018   LDLCALC 153 (H) 11/05/2016    Physical Findings: AIMS:  , ,  ,  ,    CIWA:    COWS:     Musculoskeletal: Strength & Muscle Tone: within normal limits Gait & Station: normal Patient leans: N/A  Psychiatric Specialty Exam:  Presentation  General Appearance: Casual  Eye Contact:Fair  Speech:Clear and Coherent  Speech Volume:Normal  Handedness:Right  Mood and Affect  Mood:Anxious; Depressed  Affect:Congruent   Thought Process  Thought Processes:Coherent  Descriptions of Associations:Intact  Orientation:Full (Time, Place and Person)  Thought Content:Logical  History of Schizophrenia/Schizoaffective disorder:No  Duration of Psychotic Symptoms:N/A Hallucinations:Hallucinations: None Ideas of Reference:None  Suicidal Thoughts:Denies Homicidal Thoughts:Homicidal Thoughts: No  Sensorium  Memory:Immediate Fair; Recent Fair; Remote Fair  Judgment:Intact  Insight:Good   Executive Functions  Concentration:Fair  Attention Span:Fair  [place]   Psychomotor Activity  Psychomotor Activity:Psychomotor Activity: Decreased  Assets  Assets:Communication Skills; Desire for Improvement; Financial Resources/Insurance; Housing; Resilience   Sleep  Sleep:Sleep: Good Number of Hours of Sleep: 8   Physical Exam: Physical Exam ROS Blood pressure 112/69, pulse 81, temperature 97.8 F (36.6 C), temperature  source Oral, resp. rate 18, height 5\' 10"  (1.778 m), weight 68 kg, SpO2 99 %. Body mass index is 21.52 kg/m.   Treatment Plan Summary: Daily contact with patient to assess and evaluate symptoms and progress in treatment and Medication management 55 year old male presenting for worsening depression and SI. Continues to endorse depression though denies SI/HI/AH/VH today. Continue medications as above. Supportive psychotherapy.   Salley Scarlet, MD 06/26/2021, 10:42 AM

## 2021-06-26 NOTE — BH IP Treatment Plan (Signed)
Interdisciplinary Treatment and Diagnostic Plan Update  06/26/2021 Time of Session: 12:00PM Edgar White MRN: [id number]  Principal Diagnosis: Severe recurrent major depression without psychotic features [facility])  Secondary Diagnoses: Principal Problem:   Severe recurrent major depression without psychotic features ([place]) Active Problems:   Diabetes mellitus without complication ([place])   Current Medications:  Current Facility-Administered Medications  Medication Dose Route Frequency Provider Last Rate Last Admin   acetaminophen (TYLENOL) tablet 650 mg  650 mg Oral Q6H PRN Clapacs, Madie Reno, MD   650 mg at 06/26/21 1112   alum & mag hydroxide-simeth (MAALOX/MYLANTA) 200-200-20 MG/5ML suspension 30 mL  30 mL Oral Q4H PRN Clapacs, Madie Reno, MD   30 mL at 06/25/21 2102   feeding supplement (ENSURE ENLIVE / ENSURE PLUS) liquid 237 mL  237 mL Oral TID BM Clapacs, John T, MD   237 mL at 06/26/21 1346   FLUoxetine (PROZAC) capsule 40 mg  40 mg Oral Daily Clapacs, John T, MD   40 mg at 06/26/21 0920   hydrOXYzine (ATARAX/VISTARIL) tablet 10 mg  10 mg Oral BID PRN Clapacs, Madie Reno, MD   10 mg at 06/21/21 2100   hydrOXYzine (ATARAX/VISTARIL) tablet 50 mg  50 mg Oral TID PRN Clapacs, Madie Reno, MD       loperamide (IMODIUM) capsule 2 mg  2 mg Oral Q4H PRN Salley Scarlet, MD       magnesium hydroxide (MILK OF MAGNESIA) suspension 30 mL  30 mL Oral Daily PRN Clapacs, John T, MD       metFORMIN (GLUCOPHAGE) tablet 500 mg  500 mg Oral BID WC Clapacs, John T, MD   500 mg at 06/26/21 0920   mirtazapine (REMERON) tablet 7.5 mg  7.5 mg Oral QHS Clapacs, John T, MD   7.5 mg at 06/25/21 2057   multivitamin with minerals tablet 1 tablet  1 tablet Oral Daily Clapacs, Madie Reno, MD   1 tablet at 06/26/21 0921   ondansetron (ZOFRAN-ODT) disintegrating tablet 8 mg  8 mg Oral Q8H PRN Salley Scarlet, MD   8 mg at 06/25/21 1059   traZODone (DESYREL) tablet 100 mg  100 mg Oral QHS PRN Clapacs, Madie Reno, MD   50 mg at 06/25/21  2057   PTA Medications: Medications Prior to Admission  Medication Sig Dispense Refill Last Dose   desvenlafaxine (PRISTIQ) 50 MG 24 hr tablet Take 50 mg by mouth 2 (two) times daily.      hydrOXYzine (ATARAX/VISTARIL) 10 MG tablet Take 10 mg by mouth 2 (two) times daily as needed for anxiety.      metFORMIN (GLUCOPHAGE) 500 MG tablet Take 1 tablet (500 mg total) by mouth 2 (two) times daily with a meal. (Patient not taking: No sig reported) 60 tablet 1    QUEtiapine (SEROQUEL) 100 MG tablet Take 1 tablet (100 mg total) by mouth at bedtime. (Patient not taking: No sig reported) 30 tablet 1    sertraline (ZOLOFT) 100 MG tablet Take 1 tablet (100 mg total) by mouth at bedtime. (Patient not taking: No sig reported) 30 tablet 1     Patient Stressors: Financial difficulties   Loss of both parents   Other: fear of homelessness    Patient Strengths: Motivation for treatment/growth  Physical Health   Treatment Modalities: Medication Management, Group therapy, Case management,  1 to 1 session with clinician, Psychoeducation, Recreational therapy.   Physician Treatment Plan for Primary Diagnosis: Severe recurrent major depression without psychotic features ([place]) Long Term Goal(s):  Improvement in symptoms so as ready for discharge   Short Term Goals: Ability to identify changes in lifestyle to reduce recurrence of condition will improve Ability to verbalize feelings will improve Ability to disclose and discuss suicidal ideas Ability to demonstrate self-control will improve Ability to identify and develop effective coping behaviors will improve Ability to maintain clinical measurements within normal limits will improve Compliance with prescribed medications will improve Ability to identify triggers associated with substance abuse/mental health issues will improve  Medication Management: Evaluate patient's response, side effects, and tolerance of medication regimen.  Therapeutic  Interventions: 1 to 1 sessions, Unit Group sessions and Medication administration.  Evaluation of Outcomes: Progressing  Physician Treatment Plan for Secondary Diagnosis: Principal Problem:   Severe recurrent major depression without psychotic features ([place]) Active Problems:   Diabetes mellitus without complication ([place])  Long Term Goal(s): Improvement in symptoms so as ready for discharge   Short Term Goals: Ability to identify changes in lifestyle to reduce recurrence of condition will improve Ability to verbalize feelings will improve Ability to disclose and discuss suicidal ideas Ability to demonstrate self-control will improve Ability to identify and develop effective coping behaviors will improve Ability to maintain clinical measurements within normal limits will improve Compliance with prescribed medications will improve Ability to identify triggers associated with substance abuse/mental health issues will improve     Medication Management: Evaluate patient's response, side effects, and tolerance of medication regimen.  Therapeutic Interventions: 1 to 1 sessions, Unit Group sessions and Medication administration.  Evaluation of Outcomes: Progressing   RN Treatment Plan for Primary Diagnosis: Severe recurrent major depression without psychotic features ([place]) Long Term Goal(s): Knowledge of disease and therapeutic regimen to maintain health will improve  Short Term Goals: Ability to remain free from injury will improve, Ability to verbalize frustration and anger appropriately will improve, Ability to demonstrate self-control, Ability to participate in decision making will improve, Ability to verbalize feelings will improve, Ability to disclose and discuss suicidal ideas, Ability to identify and develop effective coping behaviors will improve, and Compliance with prescribed medications will improve  Medication Management: RN will administer medications as ordered by provider, will  assess and evaluate patient's response and provide education to patient for prescribed medication. RN will report any adverse and/or side effects to prescribing provider.  Therapeutic Interventions: 1 on 1 counseling sessions, Psychoeducation, Medication administration, Evaluate responses to treatment, Monitor vital signs and CBGs as ordered, Perform/monitor CIWA, COWS, AIMS and Fall Risk screenings as ordered, Perform wound care treatments as ordered.  Evaluation of Outcomes: Progressing   LCSW Treatment Plan for Primary Diagnosis: Severe recurrent major depression without psychotic features ([place]) Long Term Goal(s): Safe transition to appropriate next level of care at discharge, Engage patient in therapeutic group addressing interpersonal concerns.  Short Term Goals: Engage patient in aftercare planning with referrals and resources, Increase social support, Increase ability to appropriately verbalize feelings, Increase emotional regulation, Facilitate acceptance of mental health diagnosis and concerns, Identify triggers associated with mental health/substance abuse issues, and Increase skills for wellness and recovery  Therapeutic Interventions: Assess for all discharge needs, 1 to 1 time with Social worker, Explore available resources and support systems, Assess for adequacy in community support network, Educate family and significant other(s) on suicide prevention, Complete Psychosocial Assessment, Interpersonal group therapy.  Evaluation of Outcomes: Progressing   Progress in Treatment: Attending groups: Yes. Participating in groups: Yes. Taking medication as prescribed: Yes. Toleration medication: Yes. Family/Significant other contact made: Yes, individual(s) contacted:  SPE completed with  patient's sister. Patient understands diagnosis: Yes. Discussing patient identified problems/goals with staff: Yes. Medical problems stabilized or resolved: Yes. Denies suicidal/homicidal ideation:  Yes. Issues/concerns per patient self-inventory: No. Other: none.  New problem(s) identified: No, Describe:  none.  New Short Term/Long Term Goal(s):  elimination of symptoms of psychosis, medication management for mood stabilization; elimination of SI thoughts; development of comprehensive mental wellness plan. Update 06/26/2021: No changes at this time.   Patient Goals:   "To get on meds that I feel work so I can get back to self." Update 06/26/2021: No changes at this time.   Discharge Plan or Barriers: CSW will assist patient in development of appropriate discharge/aftercare plan. Update 06/26/2021: No changes at this time.   Reason for Continuation of Hospitalization: Depression Medication stabilization  Estimated Length of Stay: TBD   Scribe for Treatment Team: Sherilyn Dacosta 06/26/2021 3:58 PM

## 2021-06-26 NOTE — Progress Notes (Signed)
Hospital meal provided, pt refused to get out of bed to eat.

## 2021-06-27 DIAGNOSIS — F332 Major depressive disorder, recurrent severe without psychotic features: Secondary | ICD-10-CM | POA: Diagnosis not present

## 2021-06-27 NOTE — Progress Notes (Signed)
Patient has been in bed asleep. No sign of distress. Safety precautions maintained per unit protocol.

## 2021-06-27 NOTE — Progress Notes (Signed)
[facility] MD Progress Note  06/27/2021 9:02 AM Edgar White  MRN:  [id number]  CC " Anxious"  Subjective:  Edgar White is a 55 y.o. male patient admitted with suicidal thoughts, worsening depression. No acute events overnight, medication compliant, ADLs intact. Patient denies SI/HI/AH/VH, denies medication side effects. No nausea, reports good sleep. Reports anxiety mostly around idea of discharge.    Principal Problem: Severe recurrent major depression without psychotic features ([place]) Diagnosis: Principal Problem:   Severe recurrent major depression without psychotic features ([place]) Active Problems:   Diabetes mellitus without complication ([place])  Total Time spent with patient: 20 minutes  Past Psychiatric History: See H&P  Past Medical History:  Past Medical History:  Diagnosis Date   Depression    Diabetes mellitus without complication ([place])     Past Surgical History:  Procedure Laterality Date   INGUINAL HERNIA REPAIR     LUMBAR FUSION     Family History: History reviewed. No pertinent family history. Family Psychiatric  History: See H&P Social History:  Social History   Substance and Sexual Activity  Alcohol Use No   Comment: denies use     Social History   Substance and Sexual Activity  Drug Use Yes   Types: Marijuana, Cocaine    Social History   Socioeconomic History   Marital status: Single    Spouse name: Not on file   Number of children: Not on file   Years of education: Not on file   Highest education level: Not on file  Occupational History   Not on file  Tobacco Use   Smoking status: Every Day    Packs/day: 0.50    Types: Cigarettes   Smokeless tobacco: Never  Vaping Use   Vaping Use: Never used  Substance and Sexual Activity   Alcohol use: No    Comment: denies use   Drug use: Yes    Types: Marijuana, Cocaine   Sexual activity: Never  Other Topics Concern   Not on file  Social History Narrative   Not on file   Social  Determinants of Health   Financial Resource Strain: Not on file  Food Insecurity: Not on file  Transportation Needs: Not on file  Physical Activity: Not on file  Stress: Not on file  Social Connections: Not on file   Additional Social History:    Sleep: Good  Appetite:  Fair  Current Medications: Current Facility-Administered Medications  Medication Dose Route Frequency Provider Last Rate Last Admin   acetaminophen (TYLENOL) tablet 650 mg  650 mg Oral Q6H PRN Clapacs, John T, MD   650 mg at 06/26/21 1112   alum & mag hydroxide-simeth (MAALOX/MYLANTA) 200-200-20 MG/5ML suspension 30 mL  30 mL Oral Q4H PRN Clapacs, John T, MD   30 mL at 06/25/21 2102   feeding supplement (ENSURE ENLIVE / ENSURE PLUS) liquid 237 mL  237 mL Oral TID BM Clapacs, John T, MD   237 mL at 06/26/21 2004   FLUoxetine (PROZAC) capsule 40 mg  40 mg Oral Daily Clapacs, John T, MD   40 mg at 06/26/21 0920   hydrOXYzine (ATARAX/VISTARIL) tablet 10 mg  10 mg Oral BID PRN Clapacs, John T, MD   10 mg at 06/21/21 2100   hydrOXYzine (ATARAX/VISTARIL) tablet 50 mg  50 mg Oral TID PRN Clapacs, Madie Reno, MD       loperamide (IMODIUM) capsule 2 mg  2 mg Oral Q4H PRN Salley Scarlet, MD  magnesium hydroxide (MILK OF MAGNESIA) suspension 30 mL  30 mL Oral Daily PRN Clapacs, John T, MD       metFORMIN (GLUCOPHAGE) tablet 500 mg  500 mg Oral BID WC Clapacs, John T, MD   500 mg at 06/27/21 0736   mirtazapine (REMERON) tablet 7.5 mg  7.5 mg Oral QHS Clapacs, John T, MD   7.5 mg at 06/26/21 2111   multivitamin with minerals tablet 1 tablet  1 tablet Oral Daily Clapacs, Madie Reno, MD   1 tablet at 06/26/21 0921   ondansetron (ZOFRAN-ODT) disintegrating tablet 8 mg  8 mg Oral Q8H PRN Salley Scarlet, MD   8 mg at 06/25/21 1059   traZODone (DESYREL) tablet 100 mg  100 mg Oral QHS PRN Clapacs, Madie Reno, MD   50 mg at 06/25/21 2057    Lab Results: No results found for this or any previous visit (from the past 48 hour(s)).  Blood  Alcohol level:  Lab Results  Component Value Date   ETH <10 06/17/2021   ETH <10 93/79/0240    Metabolic Disorder Labs: Lab Results  Component Value Date   HGBA1C 6.1 (H) 06/20/2021   MPG 128.37 06/20/2021   MPG 119.76 02/02/2018   No results found for: PROLACTIN Lab Results  Component Value Date   CHOL 235 (H) 02/02/2018   TRIG 146 02/02/2018   HDL 35 (L) 02/02/2018   CHOLHDL 6.7 02/02/2018   VLDL 29 02/02/2018   LDLCALC 171 (H) 02/02/2018   LDLCALC 153 (H) 11/05/2016    Physical Findings: AIMS:  , ,  ,  ,    CIWA:    COWS:     Musculoskeletal: Strength & Muscle Tone: within normal limits Gait & Station: normal Patient leans: N/A  Psychiatric Specialty Exam:  Presentation  General Appearance: Casual  Eye Contact:Fair  Speech:Clear and Coherent  Speech Volume:Normal  Handedness:Right  Mood and Affect  Mood:Anxious; Depressed  Affect:Congruent   Thought Process  Thought Processes:Coherent  Descriptions of Associations:Intact  Orientation:Full (Time, Place and Person)  Thought Content:Logical  History of Schizophrenia/Schizoaffective disorder:No  Duration of Psychotic Symptoms:N/A Hallucinations:No data recorded Ideas of Reference:None  Suicidal Thoughts:Denies Homicidal Thoughts:No data recorded  Sensorium  Memory:Immediate Fair; Recent Fair; Remote Fair  Judgment:Intact  Insight:Good   Executive Functions  Concentration:Fair  Attention Span:Fair  [place]   Psychomotor Activity  Psychomotor Activity:Normal  Assets  Assets:Communication Skills; Desire for Improvement; Financial Resources/Insurance; Housing; Resilience   Sleep  Sleep:Good, 8 hours   Physical Exam: Physical Exam ROS Blood pressure 125/65, pulse 80, temperature 98 F (36.7 C), temperature source Oral, resp. rate 18, height 5\' 10"  (1.778 m), weight 68 kg, SpO2 99 %. Body mass index is 21.52  kg/m.   Treatment Plan Summary: Daily contact with patient to assess and evaluate symptoms and progress in treatment and Medication management 55 year old male presenting for worsening depression and SI. Continues to endorse depression though denies SI/HI/AH/VH today. Continue medications as above. Supportive psychotherapy.   Salley Scarlet, MD 06/27/2021, 9:02 AM

## 2021-06-27 NOTE — Plan of Care (Signed)
Patient stayed in bed for the morning and got up for lunch. Cooperative and calm. Denying SI/HI/AVH. Pt ate lunch  and received medications. Has no concerns. Encouragements and support provided. Safety monitored as expected.

## 2021-06-28 DIAGNOSIS — F332 Major depressive disorder, recurrent severe without psychotic features: Secondary | ICD-10-CM | POA: Diagnosis not present

## 2021-06-28 NOTE — Progress Notes (Signed)
Recreation Therapy Notes   Date: 06/28/2021   Time: 1:45pm    Location: [address]   Behavioral response: N/A   Intervention Topic: Emotions   Discussion/Intervention: Patient did not attend group.   Clinical Observations/Feedback:  Patient did not attend group.   Cassell Voorhies LRT/CTRS        Ryman Rathgeber 06/28/2021 3:57 PM

## 2021-06-28 NOTE — Group Note (Signed)
[facility] LCSW Group Therapy Note    Group Date: 06/28/2021 Start Time: 6945 End Time: 1405  Type of Therapy and Topic:  Group Therapy:  Overcoming Obstacles  Participation Level:  [facility] PARTICIPATION LEVEL: Did Not Attend    Description of Group:   In this group patients will be encouraged to explore what they see as obstacles to their own wellness and recovery. They will be guided to discuss their thoughts, feelings, and behaviors related to these obstacles. The group will process together ways to cope with barriers, with attention given to specific choices patients can make. Each patient will be challenged to identify changes they are motivated to make in order to overcome their obstacles. This group will be process-oriented, with patients participating in exploration of their own experiences as well as giving and receiving support and challenge from other group members.  Therapeutic Goals: 1. Patient will identify personal and current obstacles as they relate to admission. 2. Patient will identify barriers that currently interfere with their wellness or overcoming obstacles.  3. Patient will identify feelings, thought process and behaviors related to these barriers. 4. Patient will identify two changes they are willing to make to overcome these obstacles:    Summary of Patient Progress   X   Therapeutic Modalities:   Cognitive Behavioral Therapy Solution Focused Therapy Motivational Interviewing Relapse Prevention Therapy   Edgar White A [place], LCSWA

## 2021-06-28 NOTE — Plan of Care (Signed)
  Problem: Education: Goal: Mental status will improve Outcome: Progressing   Problem: Activity: Goal: Interest or engagement in activities will improve Outcome: Progressing

## 2021-06-28 NOTE — Progress Notes (Signed)
Patient is alert and oriented x4. He rates depression as a 3/10 and anxiety as a 4/10. He states that he is anxious about discharge and does not want to leave until his medicine is "right". He says that he is feeling a little "foggy" and wants his mind to be clearer. He reports that he still feels some "brain zaps", but that they are much improved from prior days. He denies any nausea at this time. He also states that he is finding it difficult to get up in the morning. Patient was encouraged to come out to the dayroom. Support and education provided. Patient remains safe on the unit at this time.

## 2021-06-28 NOTE — Progress Notes (Signed)
D: Pt alert and oriented. Pt rated anxiety-5/ depression-4 at the beginning of shift, attributes feelings to, " I don't want to be discharged until I'm ready, until my medicine starts working well." Pt. denies experiencing any pain. Pt denies experiencing any SI/HI, or AVH this time, describes current mood as "irritable". Pt. Visited w/ brother in dayarea at beginning shift, played card game w/ peer during shift w/o any incident.    A: Scheduled medications administered to pt, per MD orders. Support and encouragement provided. Frequent verbal contact made.  Routine safety checks conducted q15 minutes.   R: No adverse drug reactions noted. Pt is agreeable to notifying staff with any safety concerns. Pt remains complaint with taking his medications. Pt interacts appropriately with others on the unit. Pt remains safe at this time. Will continue to monitor.

## 2021-06-28 NOTE — Progress Notes (Signed)
[facility] MD Progress Note  06/28/2021 6:27 PM Edgar White  MRN:  [id number] Subjective: Follow-up for this 55 year old man with chronic recurrent depression.  Patient has been in the hospital multiple days now.  On interview he tells me that he does not feel any less depressed.  He has some somatic complaints.  He says that he feels a pressure-like feeling in the front of his head.  He was quite disturbed by this.  I explained to him that this was a pretty typical description of a tension headache.  Patient is also complaining that his appetite has disappeared and asks if he should blame that on his current medicine.  He is not reporting any active suicidal ideation.  Does not appear psychotic.  He is interacting with his peers on the unit fine. Principal Problem: Severe recurrent major depression without psychotic features ([place]) Diagnosis: Principal Problem:   Severe recurrent major depression without psychotic features ([place]) Active Problems:   Diabetes mellitus without complication ([place])  Total Time spent with patient: 30 minutes  Past Psychiatric History: Longstanding chronic recurrent depression  Past Medical History:  Past Medical History:  Diagnosis Date   Depression    Diabetes mellitus without complication ([place])     Past Surgical History:  Procedure Laterality Date   INGUINAL HERNIA REPAIR     LUMBAR FUSION     Family History: History reviewed. No pertinent family history. Family Psychiatric  History: See previous Social History:  Social History   Substance and Sexual Activity  Alcohol Use No   Comment: denies use     Social History   Substance and Sexual Activity  Drug Use Yes   Types: Marijuana, Cocaine    Social History   Socioeconomic History   Marital status: Single    Spouse name: Not on file   Number of children: Not on file   Years of education: Not on file   Highest education level: Not on file  Occupational History   Not on file  Tobacco Use    Smoking status: Every Day    Packs/day: 0.50    Types: Cigarettes   Smokeless tobacco: Never  Vaping Use   Vaping Use: Never used  Substance and Sexual Activity   Alcohol use: No    Comment: denies use   Drug use: Yes    Types: Marijuana, Cocaine   Sexual activity: Never  Other Topics Concern   Not on file  Social History Narrative   Not on file   Social Determinants of Health   Financial Resource Strain: Not on file  Food Insecurity: Not on file  Transportation Needs: Not on file  Physical Activity: Not on file  Stress: Not on file  Social Connections: Not on file   Additional Social History:                         Sleep: Fair  Appetite:  Fair  Current Medications: Current Facility-Administered Medications  Medication Dose Route Frequency Provider Last Rate Last Admin   acetaminophen (TYLENOL) tablet 650 mg  650 mg Oral Q6H PRN Lateshia Schmoker T, MD   650 mg at 06/26/21 1112   alum & mag hydroxide-simeth (MAALOX/MYLANTA) 200-200-20 MG/5ML suspension 30 mL  30 mL Oral Q4H PRN Sephora Boyar T, MD   30 mL at 06/25/21 2102   feeding supplement (ENSURE ENLIVE / ENSURE PLUS) liquid 237 mL  237 mL Oral TID BM Devery Murgia, Madie Reno, MD   237  mL at 06/28/21 1514   FLUoxetine (PROZAC) capsule 40 mg  40 mg Oral Daily Ryane Konieczny, Madie Reno, MD   40 mg at 06/28/21 4174   hydrOXYzine (ATARAX/VISTARIL) tablet 10 mg  10 mg Oral BID PRN Ora Mcnatt, Madie Reno, MD   10 mg at 06/21/21 2100   hydrOXYzine (ATARAX/VISTARIL) tablet 50 mg  50 mg Oral TID PRN Chabely Norby, Madie Reno, MD       loperamide (IMODIUM) capsule 2 mg  2 mg Oral Q4H PRN Salley Scarlet, MD       magnesium hydroxide (MILK OF MAGNESIA) suspension 30 mL  30 mL Oral Daily PRN Shaila Gilchrest T, MD       metFORMIN (GLUCOPHAGE) tablet 500 mg  500 mg Oral BID WC Io Dieujuste T, MD   500 mg at 06/28/21 1639   mirtazapine (REMERON) tablet 7.5 mg  7.5 mg Oral QHS Adelynne Joerger T, MD   7.5 mg at 06/27/21 2145   multivitamin with minerals tablet 1  tablet  1 tablet Oral Daily Eshal Propps, Madie Reno, MD   1 tablet at 06/28/21 0922   ondansetron (ZOFRAN-ODT) disintegrating tablet 8 mg  8 mg Oral Q8H PRN Salley Scarlet, MD   8 mg at 06/27/21 1219   traZODone (DESYREL) tablet 100 mg  100 mg Oral QHS PRN Marvella Jenning, Madie Reno, MD   50 mg at 06/25/21 2057    Lab Results: No results found for this or any previous visit (from the past 48 hour(s)).  Blood Alcohol level:  Lab Results  Component Value Date   ETH <10 06/17/2021   ETH <10 04/04/4817    Metabolic Disorder Labs: Lab Results  Component Value Date   HGBA1C 6.1 (H) 06/20/2021   MPG 128.37 06/20/2021   MPG 119.76 02/02/2018   No results found for: PROLACTIN Lab Results  Component Value Date   CHOL 235 (H) 02/02/2018   TRIG 146 02/02/2018   HDL 35 (L) 02/02/2018   CHOLHDL 6.7 02/02/2018   VLDL 29 02/02/2018   LDLCALC 171 (H) 02/02/2018   LDLCALC 153 (H) 11/05/2016    Physical Findings: AIMS:  , ,  ,  ,    CIWA:    COWS:     Musculoskeletal: Strength & Muscle Tone: within normal limits Gait & Station: normal Patient leans: N/A  Psychiatric Specialty Exam:  Presentation  General Appearance: Casual  Eye Contact:Fair  Speech:Clear and Coherent  Speech Volume:Normal  Handedness:Right   Mood and Affect  Mood:Anxious; Depressed  Affect:Congruent   Thought Process  Thought Processes:Coherent  Descriptions of Associations:Intact  Orientation:Full (Time, Place and Person)  Thought Content:Logical  History of Schizophrenia/Schizoaffective disorder:No  Duration of Psychotic Symptoms:No data recorded Hallucinations:No data recorded Ideas of Reference:None  Suicidal Thoughts:No data recorded Homicidal Thoughts:No data recorded  Sensorium  Memory:Immediate Fair; Recent Fair; Remote Fair  Judgment:Intact  Insight:Good   Executive Functions  Concentration:Fair  Attention Span:Fair  [place]   Psychomotor Activity  Psychomotor Activity:No data recorded  Assets  Assets:Communication Skills; Desire for Improvement; Financial Resources/Insurance; Housing; Resilience   Sleep  Sleep:No data recorded   Physical Exam: Physical Exam Vitals and nursing note reviewed.  Constitutional:      Appearance: Normal appearance.  HENT:     Head: Normocephalic and atraumatic.     Mouth/Throat:     Pharynx: Oropharynx is clear.  Eyes:     Pupils: Pupils are equal, round, and reactive to light.  Cardiovascular:  Rate and Rhythm: Normal rate and regular rhythm.  Pulmonary:     Effort: Pulmonary effort is normal.     Breath sounds: Normal breath sounds.  Abdominal:     General: Abdomen is flat.     Palpations: Abdomen is soft.  Musculoskeletal:        General: Normal range of motion.  Skin:    General: Skin is warm and dry.  Neurological:     General: No focal deficit present.     Mental Status: He is alert. Mental status is at baseline.  Psychiatric:        Attention and Perception: Attention normal.        Mood and Affect: Mood is anxious.        Speech: Speech normal.        Behavior: Behavior is cooperative.        Thought Content: Thought content normal.        Cognition and Memory: Cognition normal.   Review of Systems  Constitutional: Negative.   HENT: Negative.    Eyes: Negative.   Respiratory: Negative.    Cardiovascular: Negative.   Gastrointestinal: Negative.   Musculoskeletal: Negative.   Skin: Negative.   Neurological:  Positive for headaches.  Psychiatric/Behavioral:  Positive for depression. The patient is nervous/anxious.   Blood pressure 134/89, pulse 100, temperature 97.6 F (36.4 C), temperature source Oral, resp. rate 18, height 5\' 10"  (1.778 m), weight 68 kg, SpO2 99 %. Body mass index is 21.52 kg/m.   Treatment Plan Summary: Medication management and Plan talked with patient about the inherent difficulties  of expecting changes from antidepressant medicines in an inpatient setting given the time needed to see an improvement.  Suggested to patient that it seemed to me less likely that his complaints were from his current medication.  Proposed to him that rather than making abrupt changes again we tried to stick with this medicine a little longer.  Also I suggested that he has probably maximized the benefit from inpatient treatment and we will aim for discharge within the next 1 to 2 days as he is not acutely suicidal.  Alethia Berthold, MD 06/28/2021, 6:27 PM

## 2021-06-29 DIAGNOSIS — F332 Major depressive disorder, recurrent severe without psychotic features: Secondary | ICD-10-CM | POA: Diagnosis not present

## 2021-06-29 NOTE — Progress Notes (Signed)
Patient came out of his room at 3pm and is currently eating dinner and watching TV in the dayroom.

## 2021-06-29 NOTE — Progress Notes (Signed)
Recreation Therapy Notes  Date: 06/29/2021   Time: 1:15 pm     Location:  [facility]    Behavioral response: N/A   Intervention Topic: Relaxation    Discussion/Intervention: Patient did not attend group.   Clinical Observations/Feedback:  Patient did not attend group.   Annalisse Minkoff LRT/CTRS        Verdun Rackley 06/29/2021 2:43 PM

## 2021-06-29 NOTE — Progress Notes (Signed)
[facility] MD Progress Note  06/29/2021 2:23 PM Edgar White  MRN:  [id number] Subjective: Follow-up for this 55 year old man with a history of recurrent depression.  Patient continues to endorse depression not feeling like he is much changed from when he came in.  Day-to-day he interacts well with others on the unit.  No behavior problems.  No active suicidal thought.  Continues to frequently focus on somatic symptoms.  Patient talks about his fear that his family is going to throw him out of his brother's house if he does not get more active Principal Problem: Severe recurrent major depression without psychotic features ([place]) Diagnosis: Principal Problem:   Severe recurrent major depression without psychotic features ([place]) Active Problems:   Diabetes mellitus without complication ([place])  Total Time spent with patient: 30 minutes  Past Psychiatric History: Past history of recurrent episodes of depression  Past Medical History:  Past Medical History:  Diagnosis Date   Depression    Diabetes mellitus without complication ([place])     Past Surgical History:  Procedure Laterality Date   INGUINAL HERNIA REPAIR     LUMBAR FUSION     Family History: History reviewed. No pertinent family history. Family Psychiatric  History: See previous Social History:  Social History   Substance and Sexual Activity  Alcohol Use No   Comment: denies use     Social History   Substance and Sexual Activity  Drug Use Yes   Types: Marijuana, Cocaine    Social History   Socioeconomic History   Marital status: Single    Spouse name: Not on file   Number of children: Not on file   Years of education: Not on file   Highest education level: Not on file  Occupational History   Not on file  Tobacco Use   Smoking status: Every Day    Packs/day: 0.50    Types: Cigarettes   Smokeless tobacco: Never  Vaping Use   Vaping Use: Never used  Substance and Sexual Activity   Alcohol use: No    Comment: denies  use   Drug use: Yes    Types: Marijuana, Cocaine   Sexual activity: Never  Other Topics Concern   Not on file  Social History Narrative   Not on file   Social Determinants of Health   Financial Resource Strain: Not on file  Food Insecurity: Not on file  Transportation Needs: Not on file  Physical Activity: Not on file  Stress: Not on file  Social Connections: Not on file   Additional Social History:                         Sleep: Fair  Appetite:  Fair  Current Medications: Current Facility-Administered Medications  Medication Dose Route Frequency Provider Last Rate Last Admin   acetaminophen (TYLENOL) tablet 650 mg  650 mg Oral Q6H PRN Danial Hlavac T, MD   650 mg at 06/26/21 1112   alum & mag hydroxide-simeth (MAALOX/MYLANTA) 200-200-20 MG/5ML suspension 30 mL  30 mL Oral Q4H PRN Loucinda Croy T, MD   30 mL at 06/25/21 2102   feeding supplement (ENSURE ENLIVE / ENSURE PLUS) liquid 237 mL  237 mL Oral TID BM Hilmar Moldovan T, MD   237 mL at 06/28/21 2038   FLUoxetine (PROZAC) capsule 40 mg  40 mg Oral Daily Sherman Lipuma, Madie Reno, MD   40 mg at 06/29/21 0935   hydrOXYzine (ATARAX/VISTARIL) tablet 10 mg  10 mg Oral  BID PRN Leodis Alcocer, Madie Reno, MD   10 mg at 06/21/21 2100   hydrOXYzine (ATARAX/VISTARIL) tablet 50 mg  50 mg Oral TID PRN Tareva Leske, Madie Reno, MD       loperamide (IMODIUM) capsule 2 mg  2 mg Oral Q4H PRN Salley Scarlet, MD       magnesium hydroxide (MILK OF MAGNESIA) suspension 30 mL  30 mL Oral Daily PRN Hawley Pavia T, MD       metFORMIN (GLUCOPHAGE) tablet 500 mg  500 mg Oral BID WC Niall Illes, Madie Reno, MD   500 mg at 06/29/21 0936   mirtazapine (REMERON) tablet 7.5 mg  7.5 mg Oral QHS Raylen Ken T, MD   7.5 mg at 06/28/21 2037   multivitamin with minerals tablet 1 tablet  1 tablet Oral Daily Antoin Dargis, Madie Reno, MD   1 tablet at 06/29/21 0935   ondansetron (ZOFRAN-ODT) disintegrating tablet 8 mg  8 mg Oral Q8H PRN Salley Scarlet, MD   8 mg at 06/27/21 1219   traZODone  (DESYREL) tablet 100 mg  100 mg Oral QHS PRN Marybeth Dandy, Madie Reno, MD   50 mg at 06/28/21 2043    Lab Results: No results found for this or any previous visit (from the past 48 hour(s)).  Blood Alcohol level:  Lab Results  Component Value Date   ETH <10 06/17/2021   ETH <10 02/19/1600    Metabolic Disorder Labs: Lab Results  Component Value Date   HGBA1C 6.1 (H) 06/20/2021   MPG 128.37 06/20/2021   MPG 119.76 02/02/2018   No results found for: PROLACTIN Lab Results  Component Value Date   CHOL 235 (H) 02/02/2018   TRIG 146 02/02/2018   HDL 35 (L) 02/02/2018   CHOLHDL 6.7 02/02/2018   VLDL 29 02/02/2018   LDLCALC 171 (H) 02/02/2018   LDLCALC 153 (H) 11/05/2016    Physical Findings: AIMS:  , ,  ,  ,    CIWA:    COWS:     Musculoskeletal: Strength & Muscle Tone: within normal limits Gait & Station: normal Patient leans: N/A  Psychiatric Specialty Exam:  Presentation  General Appearance: Casual  Eye Contact:Fair  Speech:Clear and Coherent  Speech Volume:Normal  Handedness:Right   Mood and Affect  Mood:Anxious; Depressed  Affect:Congruent   Thought Process  Thought Processes:Coherent  Descriptions of Associations:Intact  Orientation:Full (Time, Place and Person)  Thought Content:Logical  History of Schizophrenia/Schizoaffective disorder:No  Duration of Psychotic Symptoms:No data recorded Hallucinations:No data recorded Ideas of Reference:None  Suicidal Thoughts:No data recorded Homicidal Thoughts:No data recorded  Sensorium  Memory:Immediate Fair; Recent Fair; Remote Fair  Judgment:Intact  Insight:Good   Executive Functions  Concentration:Fair  Attention Span:Fair  [place]   Psychomotor Activity  Psychomotor Activity:No data recorded  Assets  Assets:Communication Skills; Desire for Improvement; Financial Resources/Insurance; Housing; Resilience   Sleep  Sleep:No data  recorded   Physical Exam: Physical Exam Vitals and nursing note reviewed.  Constitutional:      Appearance: Normal appearance.  HENT:     Head: Normocephalic and atraumatic.     Mouth/Throat:     Pharynx: Oropharynx is clear.  Eyes:     Pupils: Pupils are equal, round, and reactive to light.  Cardiovascular:     Rate and Rhythm: Normal rate and regular rhythm.  Pulmonary:     Effort: Pulmonary effort is normal.     Breath sounds: Normal breath sounds.  Abdominal:     General: Abdomen is  flat.     Palpations: Abdomen is soft.  Musculoskeletal:        General: Normal range of motion.  Skin:    General: Skin is warm and dry.  Neurological:     General: No focal deficit present.     Mental Status: He is alert. Mental status is at baseline.  Psychiatric:        Attention and Perception: Attention normal.        Mood and Affect: Mood is depressed.        Speech: Speech normal.        Behavior: Behavior is cooperative.        Thought Content: Thought content normal.        Cognition and Memory: Cognition normal.   Review of Systems  Constitutional: Negative.   HENT: Negative.    Eyes: Negative.   Respiratory: Negative.    Cardiovascular: Negative.   Gastrointestinal: Negative.   Musculoskeletal: Negative.   Skin: Negative.   Neurological: Negative.   Psychiatric/Behavioral:  Positive for depression. The patient is nervous/anxious.   Blood pressure 127/76, pulse 78, temperature 98 F (36.7 C), temperature source Oral, resp. rate 18, height 5\' 10"  (1.778 m), weight 68 kg, SpO2 99 %. Body mass index is 21.52 kg/m.   Treatment Plan Summary: Medication management and Plan continue current medicine.  Supportive counseling.  Encouragement to him to try to have a more positive focus on being able to be active and do things when he gets home.  We have made an agreement that we are planning on likely discharge tomorrow.  Alethia Berthold, MD 06/29/2021, 2:23 PM

## 2021-06-29 NOTE — Progress Notes (Signed)
Patient denies SI, HI, and AVH. Patient continues to endorse depression. He states that he is going home tomorrow and feels ok about discharging. He says that he feels he is getting adjusted to his medications. Patient has remained isolative to his room and did not come out for breakfast. Patient is compliant with scheduled medications. Support and encouragement provided. He remains safe on the unit at this time.

## 2021-06-29 NOTE — Progress Notes (Signed)
Patient continues to complain of depression. Patient had visit from sister on yesterday evening and states it did not go well. Reports he feels as if he is not in charge of his own life. Patient reports he wants to make decisions for himself and not do what they want him to do. Denies any SI, HI, AVH. Patient visible in milieu early shift, isolative to self. No interaction with family member or peers. Sister reports he is not talking to her. Encouragement and support provided. Safety checks maintained. Medications given as prescribed. Pt receptive and remains safe on unit with q 15 min checks.

## 2021-06-29 NOTE — Group Note (Signed)
[facility] LCSW Group Therapy Note   Group Date: 06/29/2021 Start Time: 1224 End Time: 1345  Type of Therapy/Topic:  Group Therapy:  Feelings about Diagnosis  Participation Level:  Did Not Attend      Description of Group:    This group will allow patients to explore their thoughts and feelings about diagnoses they have received. Patients will be guided to explore their level of understanding and acceptance of these diagnoses. Facilitator will encourage patients to process their thoughts and feelings about the reactions of others to their diagnosis, and will guide patients in identifying ways to discuss their diagnosis with significant others in their lives. This group will be process-oriented, with patients participating in exploration of their own experiences as well as giving and receiving support and challenge from other group members.   Therapeutic Goals: 1. Patient will demonstrate understanding of diagnosis as evidence by identifying two or more symptoms of the disorder:  2. Patient will be able to express two feelings regarding the diagnosis 3. Patient will demonstrate ability to communicate their needs through discussion and/or role plays  Summary of Patient Progress:  X    Therapeutic Modalities:   Cognitive Behavioral Therapy Brief Therapy Feelings Identification    [place], LCSWA

## 2021-06-29 NOTE — Progress Notes (Signed)
Pt lying in bed with eyes open; calm, cooperative. Pt states "I'm okay." Pt denies pain and SI/HI/AVH at this time. Pt states that he has been sleeping "good" and describes appetite as "not been good"; she says that lack of appetite may be due to medication side effects and bland hospital food. Pt expresses feelings of anxiety, which he rates 4/10 and depression, which he rates 3/10 due to "being discharged tomorrow" and says "I've got a lot to work on." Pt declined anxiety medication at this time. No acute distress noted.

## 2021-06-29 NOTE — Progress Notes (Signed)
Patient refused to get out of bed for lunch. Patient was also encouraged to spend time in the dayroom interacting  with others. Patient stated that he had too much to think about before discharge and that he just wanted to be alone.

## 2021-06-30 DIAGNOSIS — F332 Major depressive disorder, recurrent severe without psychotic features: Secondary | ICD-10-CM | POA: Diagnosis not present

## 2021-06-30 NOTE — Progress Notes (Signed)
[facility] MD Progress Note  06/30/2021 4:41 PM Edgar White  MRN:  [id number] Subjective: Follow-up for 55 year old man with recurrent severe depression.  Plan had been for discharge today but this morning the patient was informed by his siblings that he was not allowed to come back and stay at his brother's home which leaves him homeless at this time.  Not clear if they have any contingency on that.  Patient tearful sad and withdrawn and feeling very hopeless and once again expressing suicidal ideation. Principal Problem: Severe recurrent major depression without psychotic features ([place]) Diagnosis: Principal Problem:   Severe recurrent major depression without psychotic features ([place]) Active Problems:   Diabetes mellitus without complication ([place])  Total Time spent with patient: 30 minutes  Past Psychiatric History: Past history of multiple episodes of recurrent severe depression  Past Medical History:  Past Medical History:  Diagnosis Date   Depression    Diabetes mellitus without complication ([place])     Past Surgical History:  Procedure Laterality Date   INGUINAL HERNIA REPAIR     LUMBAR FUSION     Family History: History reviewed. No pertinent family history. Family Psychiatric  History: See previous Social History:  Social History   Substance and Sexual Activity  Alcohol Use No   Comment: denies use     Social History   Substance and Sexual Activity  Drug Use Yes   Types: Marijuana, Cocaine    Social History   Socioeconomic History   Marital status: Single    Spouse name: Not on file   Number of children: Not on file   Years of education: Not on file   Highest education level: Not on file  Occupational History   Not on file  Tobacco Use   Smoking status: Every Day    Packs/day: 0.50    Types: Cigarettes   Smokeless tobacco: Never  Vaping Use   Vaping Use: Never used  Substance and Sexual Activity   Alcohol use: No    Comment: denies use   Drug use: Yes     Types: Marijuana, Cocaine   Sexual activity: Never  Other Topics Concern   Not on file  Social History Narrative   Not on file   Social Determinants of Health   Financial Resource Strain: Not on file  Food Insecurity: Not on file  Transportation Needs: Not on file  Physical Activity: Not on file  Stress: Not on file  Social Connections: Not on file   Additional Social History:                         Sleep: Fair  Appetite:  Fair  Current Medications: Current Facility-Administered Medications  Medication Dose Route Frequency Provider Last Rate Last Admin   acetaminophen (TYLENOL) tablet 650 mg  650 mg Oral Q6H PRN Amoria Mclees T, MD   650 mg at 06/26/21 1112   alum & mag hydroxide-simeth (MAALOX/MYLANTA) 200-200-20 MG/5ML suspension 30 mL  30 mL Oral Q4H PRN Therin Vetsch T, MD   30 mL at 06/25/21 2102   feeding supplement (ENSURE ENLIVE / ENSURE PLUS) liquid 237 mL  237 mL Oral TID BM Honestii Marton T, MD   237 mL at 06/30/21 1549   FLUoxetine (PROZAC) capsule 40 mg  40 mg Oral Daily Keven Osborn, Madie Reno, MD   40 mg at 06/30/21 0901   hydrOXYzine (ATARAX/VISTARIL) tablet 10 mg  10 mg Oral BID PRN Jaedan Huttner, Madie Reno, MD  10 mg at 06/21/21 2100   hydrOXYzine (ATARAX/VISTARIL) tablet 50 mg  50 mg Oral TID PRN Preslea Rhodus, Madie Reno, MD       loperamide (IMODIUM) capsule 2 mg  2 mg Oral Q4H PRN Salley Scarlet, MD       magnesium hydroxide (MILK OF MAGNESIA) suspension 30 mL  30 mL Oral Daily PRN Shadi Sessler T, MD       metFORMIN (GLUCOPHAGE) tablet 500 mg  500 mg Oral BID WC Amaura Authier T, MD   500 mg at 06/30/21 0820   mirtazapine (REMERON) tablet 7.5 mg  7.5 mg Oral QHS Gennette Shadix T, MD   7.5 mg at 06/29/21 2113   multivitamin with minerals tablet 1 tablet  1 tablet Oral Daily Donnelle Rubey, Madie Reno, MD   1 tablet at 06/30/21 0901   ondansetron (ZOFRAN-ODT) disintegrating tablet 8 mg  8 mg Oral Q8H PRN Salley Scarlet, MD   8 mg at 06/27/21 1219   traZODone (DESYREL) tablet 100  mg  100 mg Oral QHS PRN Greysyn Vanderberg, Madie Reno, MD   100 mg at 06/29/21 2113    Lab Results: No results found for this or any previous visit (from the past 48 hour(s)).  Blood Alcohol level:  Lab Results  Component Value Date   ETH <10 06/17/2021   ETH <10 37/16/9678    Metabolic Disorder Labs: Lab Results  Component Value Date   HGBA1C 6.1 (H) 06/20/2021   MPG 128.37 06/20/2021   MPG 119.76 02/02/2018   No results found for: PROLACTIN Lab Results  Component Value Date   CHOL 235 (H) 02/02/2018   TRIG 146 02/02/2018   HDL 35 (L) 02/02/2018   CHOLHDL 6.7 02/02/2018   VLDL 29 02/02/2018   LDLCALC 171 (H) 02/02/2018   LDLCALC 153 (H) 11/05/2016    Physical Findings: AIMS:  , ,  ,  ,    CIWA:    COWS:     Musculoskeletal: Strength & Muscle Tone: within normal limits Gait & Station: normal Patient leans: N/A  Psychiatric Specialty Exam:  Presentation  General Appearance: Casual  Eye Contact:Fair  Speech:Clear and Coherent  Speech Volume:Normal  Handedness:Right   Mood and Affect  Mood:Anxious; Depressed  Affect:Congruent   Thought Process  Thought Processes:Coherent  Descriptions of Associations:Intact  Orientation:Full (Time, Place and Person)  Thought Content:Logical  History of Schizophrenia/Schizoaffective disorder:No  Duration of Psychotic Symptoms:No data recorded Hallucinations:No data recorded Ideas of Reference:None  Suicidal Thoughts:No data recorded Homicidal Thoughts:No data recorded  Sensorium  Memory:Immediate Fair; Recent Fair; Remote Fair  Judgment:Intact  Insight:Good   Executive Functions  Concentration:Fair  Attention Span:Fair  [place]   Psychomotor Activity  Psychomotor Activity:No data recorded  Assets  Assets:Communication Skills; Desire for Improvement; Financial Resources/Insurance; Housing; Resilience   Sleep  Sleep:No data recorded   Physical  Exam: Physical Exam Vitals and nursing note reviewed.  Constitutional:      Appearance: Normal appearance.  HENT:     Head: Normocephalic and atraumatic.     Mouth/Throat:     Pharynx: Oropharynx is clear.  Eyes:     Pupils: Pupils are equal, round, and reactive to light.  Cardiovascular:     Rate and Rhythm: Normal rate and regular rhythm.  Pulmonary:     Effort: Pulmonary effort is normal.     Breath sounds: Normal breath sounds.  Abdominal:     General: Abdomen is flat.     Palpations: Abdomen is  soft.  Musculoskeletal:        General: Normal range of motion.  Skin:    General: Skin is warm and dry.  Neurological:     General: No focal deficit present.     Mental Status: He is alert. Mental status is at baseline.  Psychiatric:        Attention and Perception: He is inattentive.        Mood and Affect: Mood is depressed. Affect is blunt and tearful.        Speech: Speech is delayed.        Behavior: Behavior is slowed.        Thought Content: Thought content includes suicidal ideation. Thought content does not include suicidal plan.        Judgment: Judgment is inappropriate.   Review of Systems  Constitutional: Negative.   HENT: Negative.    Eyes: Negative.   Respiratory: Negative.    Cardiovascular: Negative.   Gastrointestinal: Negative.   Musculoskeletal: Negative.   Skin: Negative.   Neurological: Negative.   Psychiatric/Behavioral:  Positive for depression and suicidal ideas. The patient is nervous/anxious.   Blood pressure 121/76, pulse 97, temperature 98.6 F (37 C), temperature source Oral, resp. rate 20, height 5\' 10"  (1.778 m), weight 68 kg, SpO2 99 %. Body mass index is 21.52 kg/m.   Treatment Plan Summary: Plan patient seen this morning.  His case is being transferred to Dr. Louis Meckel who will be taking over the geriatric unit as of tomorrow.  Due to return of suicidal ideation and lack of safe discharge plan his discharge is being postponed.  Treatment  team will work on considering any changes to medication treatment.  Patient encouraged to be up and out of bed as much is possible today.  Alethia Berthold, MD 06/30/2021, 4:41 PM

## 2021-06-30 NOTE — Progress Notes (Signed)
Pt was withdrawn to room for the first part of this shift.  Staff encouraged pt to sit in day room to socialize with peers.  He attended group(s) offered as well. Edgar White became tearful during nursing assessment when asked about SI.  He would not give a verbal answer however assured staff that he would not attempt to harm himself "as long as I'm in here".  Staff asked if he had attempted to contact his siblings and he stated "not today." Pt has made an effort to stay out of his room and is currently watching a movie in the day room with his peer.  Cont to monitor pt for additional changes in behaviors

## 2021-06-30 NOTE — Progress Notes (Signed)
Patient is alert and oriented. Calm and cooperative during assessment. Denies SI, HI, and AVH. Endorse anxiety and depression. Patient stated " I took care of my mother for 2 year before she die and my sibling turn her against me", I don't trust them". Patient also stated " I use to take my mother pain medication when I was caring for her, I know it's the devil". Patient remain safe in the unit with Q15 minute safety checks. Patient is currently in the dayroom watching movie.

## 2021-06-30 NOTE — BHH Counselor (Signed)
CSW spoke with pt's sister, Edgar White, [phone], who stated that she is attempting to coordinate pt's entry to a therapeutic program in [place]. [facility].   She informed CSW that she had been in conversation with  Colletta Maryland, a staff member, [phone], who thought pt could be a good fit for the program, which also provides housing support.   CSW will follow up with Colletta Maryland to obtain information on program and coordinate potential admittance from there.   Edgar White, MSW, LCSW-A 11/9/20224:08 PM

## 2021-06-30 NOTE — Progress Notes (Signed)
Recreation Therapy Notes    Date: 06/30/2021  Time: 1:30pm   Location: Craft room  Behavioral response: Appropriate  Intervention Topic: Wellness    Discussion/Intervention:  Group content today was focused on Wellness. The group defined wellness and some positive ways they make decisions for themselves. Individuals expressed reasons why they neglected any wellness in the past. Patients described ways to improve wellness skills in the future. The group explained what could happen if they did not do any wellness at all. Participants express how bad choices has affected them and others around them. Individual explained the importance of wellness. The group participated in the intervention "Testing my Wellness" where they had a chance to identify some of their weaknesses and strengths in wellness.  Clinical Observations/Feedback: Patient came to group and was focused on what peers and staff had to say about wellness. He stated that he likes to rest to promote wellness. Participant explained that wellness is important because it keeps you in the right frame of mind. Individual was social with staff while participating in the intervention.  Kathyjo Briere LRT/CTRS         Edgar White 06/30/2021 2:51 PM

## 2021-07-01 ENCOUNTER — Encounter: Payer: Self-pay | Admitting: Psychiatry

## 2021-07-01 MED ORDER — MIRTAZAPINE 15 MG PO TABS
15.0000 mg | ORAL_TABLET | Freq: Every day | ORAL | Status: DC
  Administered 2021-07-01 – 2021-07-08 (×8): 15 mg via ORAL
  Filled 2021-07-01 (×8): qty 1

## 2021-07-01 MED ORDER — VENLAFAXINE HCL ER 75 MG PO CP24
75.0000 mg | ORAL_CAPSULE | Freq: Every day | ORAL | Status: DC
  Administered 2021-07-02 – 2021-07-04 (×3): 75 mg via ORAL
  Filled 2021-07-01 (×3): qty 1

## 2021-07-01 NOTE — Progress Notes (Signed)
Patient slept well through the night. Remain safe on the unit with q15 minute safety checks.

## 2021-07-01 NOTE — Plan of Care (Signed)
Patient Flat and Isolative on unit during assessment.  Patient denies SI/HI AVH and contracts for safety.  Patient did verbalized 5/10 score for Depression/Anxiety.  Patient has limited participation in therapeutic milieu.  Patient did verbalize healthy coping skills to include therapeutic communicating and exercise.  Will continue q 15 minute safety checks. And monitoring.  Patient in no apparent distress at this time.  Patient informed to notify nursing staff if any needs arise.   Problem: Education: Goal: Knowledge of [place] [facility] Education information/materials will improve Outcome: Progressing Goal: Emotional status will improve Outcome: Progressing Goal: Mental status will improve Outcome: Progressing Goal: Verbalization of understanding the information provided will improve Outcome: Progressing   Problem: Activity: Goal: Interest or engagement in activities will improve Outcome: Not Progressing

## 2021-07-01 NOTE — Group Note (Deleted)
LCSW Group Therapy Note  Group Date: 07/01/2021 Start Time: 0712 End Time: 1400   Type of Therapy and Topic:  Group Therapy - Healthy vs Unhealthy Coping Skills  Participation Level:  {BHH PARTICIPATION [id number]}   Description of Group The focus of this group was to determine what unhealthy coping techniques typically are used by group members and what healthy coping techniques would be helpful in coping with various problems. Patients were guided in becoming aware of the differences between healthy and unhealthy coping techniques. Patients were asked to identify 2-3 healthy coping skills they would like to learn to use more effectively.  Therapeutic Goals 1. Patients learned that coping is what human beings do all day long to deal with various situations in their lives 2. Patients defined and discussed healthy vs unhealthy coping techniques 3. Patients identified their preferred coping techniques and identified whether these were healthy or unhealthy 4. Patients determined 2-3 healthy coping skills they would like to become more familiar with and use more often. 5. Patients provided support and ideas to each other   Summary of Patient Progress:  During group, *** expressed ***. Patient proved open to input from peers and feedback from [place]. Patient demonstrated *** insight into the subject matter, was respectful of peers, and participated throughout the entire session.   Therapeutic Modalities Cognitive Behavioral Therapy Motivational Interviewing  Jalie Eiland A [place], Latanya Presser 07/01/2021  2:17 PM

## 2021-07-01 NOTE — BH IP Treatment Plan (Signed)
Interdisciplinary Treatment and Diagnostic Plan Update  07/01/2021 Time of Session: 9:00AM Edgar White MRN: [id number]  Principal Diagnosis: Severe recurrent major depression without psychotic features [facility])  Secondary Diagnoses: Principal Problem:   Severe recurrent major depression without psychotic features ([place]) Active Problems:   Diabetes mellitus without complication ([place])   Current Medications:  Current Facility-Administered Medications  Medication Dose Route Frequency Provider Last Rate Last Admin   acetaminophen (TYLENOL) tablet 650 mg  650 mg Oral Q6H PRN Clapacs, Madie Reno, MD   650 mg at 06/26/21 1112   alum & mag hydroxide-simeth (MAALOX/MYLANTA) 200-200-20 MG/5ML suspension 30 mL  30 mL Oral Q4H PRN Clapacs, Madie Reno, MD   30 mL at 06/25/21 2102   feeding supplement (ENSURE ENLIVE / ENSURE PLUS) liquid 237 mL  237 mL Oral TID BM Clapacs, John T, MD   237 mL at 07/01/21 0930   hydrOXYzine (ATARAX/VISTARIL) tablet 10 mg  10 mg Oral BID PRN Clapacs, Madie Reno, MD   10 mg at 06/21/21 2100   hydrOXYzine (ATARAX/VISTARIL) tablet 50 mg  50 mg Oral TID PRN Clapacs, Madie Reno, MD       loperamide (IMODIUM) capsule 2 mg  2 mg Oral Q4H PRN Salley Scarlet, MD       magnesium hydroxide (MILK OF MAGNESIA) suspension 30 mL  30 mL Oral Daily PRN Clapacs, Madie Reno, MD       metFORMIN (GLUCOPHAGE) tablet 500 mg  500 mg Oral BID WC Clapacs, John T, MD   500 mg at 07/01/21 0924   mirtazapine (REMERON) tablet 15 mg  15 mg Oral QHS Parks Ranger, DO       multivitamin with minerals tablet 1 tablet  1 tablet Oral Daily Clapacs, Madie Reno, MD   1 tablet at 07/01/21 0928   ondansetron (ZOFRAN-ODT) disintegrating tablet 8 mg  8 mg Oral Q8H PRN Salley Scarlet, MD   8 mg at 06/27/21 1219   traZODone (DESYREL) tablet 100 mg  100 mg Oral QHS PRN Clapacs, Madie Reno, MD   100 mg at 06/29/21 2113   [START ON 07/02/2021] venlafaxine XR (EFFEXOR-XR) 24 hr capsule 75 mg  75 mg Oral Q breakfast Parks Ranger, DO       PTA Medications: Medications Prior to Admission  Medication Sig Dispense Refill Last Dose   desvenlafaxine (PRISTIQ) 50 MG 24 hr tablet Take 50 mg by mouth 2 (two) times daily.      hydrOXYzine (ATARAX/VISTARIL) 10 MG tablet Take 10 mg by mouth 2 (two) times daily as needed for anxiety.      metFORMIN (GLUCOPHAGE) 500 MG tablet Take 1 tablet (500 mg total) by mouth 2 (two) times daily with a meal. (Patient not taking: No sig reported) 60 tablet 1    QUEtiapine (SEROQUEL) 100 MG tablet Take 1 tablet (100 mg total) by mouth at bedtime. (Patient not taking: No sig reported) 30 tablet 1    sertraline (ZOLOFT) 100 MG tablet Take 1 tablet (100 mg total) by mouth at bedtime. (Patient not taking: No sig reported) 30 tablet 1     Patient Stressors: Financial difficulties   Loss of both parents   Other: fear of homelessness    Patient Strengths: Motivation for treatment/growth  Physical Health   Treatment Modalities: Medication Management, Group therapy, Case management,  1 to 1 session with clinician, Psychoeducation, Recreational therapy.   Physician Treatment Plan for Primary Diagnosis: Severe recurrent major depression without psychotic features [facility]) Long Term  Goal(s): Improvement in symptoms so as ready for discharge   Short Term Goals: Ability to identify changes in lifestyle to reduce recurrence of condition will improve Ability to verbalize feelings will improve Ability to disclose and discuss suicidal ideas Ability to demonstrate self-control will improve Ability to identify and develop effective coping behaviors will improve Ability to maintain clinical measurements within normal limits will improve Compliance with prescribed medications will improve Ability to identify triggers associated with substance abuse/mental health issues will improve  Medication Management: Evaluate patient's response, side effects, and tolerance of medication  regimen.  Therapeutic Interventions: 1 to 1 sessions, Unit Group sessions and Medication administration.  Evaluation of Outcomes: Adequate for Discharge  Physician Treatment Plan for Secondary Diagnosis: Principal Problem:   Severe recurrent major depression without psychotic features ([place]) Active Problems:   Diabetes mellitus without complication ([place])  Long Term Goal(s): Improvement in symptoms so as ready for discharge   Short Term Goals: Ability to identify changes in lifestyle to reduce recurrence of condition will improve Ability to verbalize feelings will improve Ability to disclose and discuss suicidal ideas Ability to demonstrate self-control will improve Ability to identify and develop effective coping behaviors will improve Ability to maintain clinical measurements within normal limits will improve Compliance with prescribed medications will improve Ability to identify triggers associated with substance abuse/mental health issues will improve     Medication Management: Evaluate patient's response, side effects, and tolerance of medication regimen.  Therapeutic Interventions: 1 to 1 sessions, Unit Group sessions and Medication administration.  Evaluation of Outcomes: Adequate for Discharge   RN Treatment Plan for Primary Diagnosis: Severe recurrent major depression without psychotic features ([place]) Long Term Goal(s): Knowledge of disease and therapeutic regimen to maintain health will improve  Short Term Goals: Ability to remain free from injury will improve, Ability to verbalize frustration and anger appropriately will improve, Ability to demonstrate self-control, Ability to participate in decision making will improve, Ability to verbalize feelings will improve, Ability to identify and develop effective coping behaviors will improve, and Compliance with prescribed medications will improve  Medication Management: RN will administer medications as ordered by provider, will  assess and evaluate patient's response and provide education to patient for prescribed medication. RN will report any adverse and/or side effects to prescribing provider.  Therapeutic Interventions: 1 on 1 counseling sessions, Psychoeducation, Medication administration, Evaluate responses to treatment, Monitor vital signs and CBGs as ordered, Perform/monitor CIWA, COWS, AIMS and Fall Risk screenings as ordered, Perform wound care treatments as ordered.  Evaluation of Outcomes: Adequate for Discharge   LCSW Treatment Plan for Primary Diagnosis: Severe recurrent major depression without psychotic features ([place]) Long Term Goal(s): Safe transition to appropriate next level of care at discharge, Engage patient in therapeutic group addressing interpersonal concerns.  Short Term Goals: Engage patient in aftercare planning with referrals and resources, Increase social support, Increase ability to appropriately verbalize feelings, Increase emotional regulation, Facilitate acceptance of mental health diagnosis and concerns, Identify triggers associated with mental health/substance abuse issues, and Increase skills for wellness and recovery  Therapeutic Interventions: Assess for all discharge needs, 1 to 1 time with Social worker, Explore available resources and support systems, Assess for adequacy in community support network, Educate family and significant other(s) on suicide prevention, Complete Psychosocial Assessment, Interpersonal group therapy.  Evaluation of Outcomes: Adequate for Discharge   Progress in Treatment: Attending groups: No. Participating in groups: No. Taking medication as prescribed: Yes. Toleration medication: Yes. Family/Significant other contact made: Yes, individual(s) contacted:  pt's  sister Patient understands diagnosis: Yes. Discussing patient identified problems/goals with staff: Yes. Medical problems stabilized or resolved: Yes. Denies suicidal/homicidal ideation:  Yes. Issues/concerns per patient self-inventory: No. Other: None  New problem(s) identified: No, Describe:  None  New Short Term/Long Term Goal(s):  elimination of symptoms of psychosis, medication management for mood stabilization; elimination of SI thoughts; development of comprehensive mental wellness plan. Update 06/26/2021: No changes at this time. Update 07/01/21: No changes at this time,  Patient Goals:   "To get on meds that I feel work so I can get back to self." Update 06/26/2021: No changes at this time. Update 07/01/21: No changes at this time   Discharge Plan or Barriers: CSW will assist patient in development of appropriate discharge/aftercare plan. Update 06/26/2021: No changes at this time. Update 07/01/21: CSW was informed by pt's sisters that there was a mental health program in [place] that pt is interested in for follow up care. CSW has contacted facility, [facility], to find out acceptance into program, with possible housing.    Reason for Continuation of Hospitalization: Depression Medication stabilization   Estimated Length of Stay: TBD   Scribe for Treatment Team: Savayah Waltrip A [place], Latanya Presser 07/01/2021 12:30 PM

## 2021-07-01 NOTE — BHH Counselor (Signed)
CSW contacted Colletta Maryland at [employer],  [phone] of [place]. CSW left voicemail with CSW's contact information. CSW will follow up at another time today if not contacted within a timely manner.    Davonne Jarnigan [place], MSW, LCSW-A 11/10/20229:53 AM

## 2021-07-01 NOTE — Group Note (Signed)
[facility] LCSW Group Therapy Note   Group Date: 07/01/2021 Start Time: 1287 End Time: 1400   Type of Therapy/Topic:  Group Therapy:  Balance in Life  Participation Level:  Active   Description of Group:    This group will address the concept of balance and how it feels and looks when one is unbalanced. Patients will be encouraged to process areas in their lives that are out of balance, and identify reasons for remaining unbalanced. Facilitators will guide patients utilizing problem- solving interventions to address and correct the stressor making their life unbalanced. Understanding and applying boundaries will be explored and addressed for obtaining  and maintaining a balanced life. Patients will be encouraged to explore ways to assertively make their unbalanced needs known to significant others in their lives, using other group members and facilitator for support and feedback.  Therapeutic Goals: Patient will identify two or more emotions or situations they have that consume much of in their lives. Patient will identify signs/triggers that life has become out of balance:  Patient will identify two ways to set boundaries in order to achieve balance in their lives:  Patient will demonstrate ability to communicate their needs through discussion and/or role plays  Summary of Patient Progress:  Patient was present for the entirety of the group session. Patient was an active listener and participated in the topic of discussion. CSW provided coping skills activities and demonstrated the importance of maintaining balance through relaxation and facilitating a calming environment.  He stated that he needed to find balance through attempting to try therapy.      Therapeutic Modalities:   Cognitive Behavioral Therapy Solution-Focused Therapy Assertiveness Training   [place] A [place], LCSWA

## 2021-07-01 NOTE — Progress Notes (Signed)
[facility] MD Progress Note  07/01/2021 12:20 PM Edgar White  MRN:  [id number] Subjective: Edgar White was seen by myself today for the first time.  I saw him yesterday with Dr. Weber White.  Edgar White has a very long history of depression.  He has received ECT in the past back in 2014.  He had approximately 7 treatments but states that he does not think it helped.  He was on Effexor for about 7 years and felt that it was the most helpful.  I discussed changing his Prozac to Effexor today.  He would like that.  He states that the Remeron is making him too sleepy in the daytime.  I explained to him that sometimes if you increase the dose he will be so tired the next day and it will help more with depression.  He understood and agreed.  He has not had any contact with his brother with whom he lives.  His brother wants him to do therapy he is okay with that but would like some time before he starts. Principal Problem: Severe recurrent major depression without psychotic features (Edgar White) Diagnosis: Principal Problem:   Severe recurrent major depression without psychotic features (Edgar White) Active Problems:   Diabetes mellitus without complication (Edgar White)  Total Time spent with patient: 20 minutes  Past Psychiatric History: Extensive  Past Medical History:  Past Medical History:  Diagnosis Date   Depression    Diabetes mellitus without complication ([place])     Past Surgical History:  Procedure Laterality Date   INGUINAL HERNIA REPAIR     LUMBAR FUSION     Family History: History reviewed. No pertinent family history. Family Psychiatric  History: Unknown Social History:  Social History   Substance and Sexual Activity  Alcohol Use No   Comment: denies use     Social History   Substance and Sexual Activity  Drug Use Yes   Types: Marijuana, Cocaine    Social History   Socioeconomic History   Marital status: Single    Spouse name: Not on file   Number of children: Not on file   Years of education: Not on  file   Highest education level: Not on file  Occupational History   Not on file  Tobacco Use   Smoking status: Every Day    Packs/day: 0.50    Types: Cigarettes   Smokeless tobacco: Never  Vaping Use   Vaping Use: Never used  Substance and Sexual Activity   Alcohol use: No    Comment: denies use   Drug use: Yes    Types: Marijuana, Cocaine   Sexual activity: Never  Other Topics Concern   Not on file  Social History Narrative   Not on file   Social Determinants of Health   Financial Resource Strain: Not on file  Food Insecurity: Not on file  Transportation Needs: Not on file  Physical Activity: Not on file  Stress: Not on file  Social Connections: Not on file                       Sleep: Good  Appetite:  Fair  Current Medications: Current Facility-Administered Medications  Medication Dose Route Frequency Provider Last Rate Last Admin   acetaminophen (TYLENOL) tablet 650 mg  650 mg Oral Q6H PRN Edgar White, Edgar T, MD   650 mg at 06/26/21 1112   alum & mag hydroxide-simeth (MAALOX/MYLANTA) 200-200-20 MG/5ML suspension 30 mL  30 mL Oral Q4H PRN Edgar White, Edgar Reno, MD  30 mL at 06/25/21 2102   feeding supplement (ENSURE ENLIVE / ENSURE PLUS) liquid 237 mL  237 mL Oral TID BM Edgar White, Edgar T, MD   237 mL at 07/01/21 0930   hydrOXYzine (ATARAX/VISTARIL) tablet 10 mg  10 mg Oral BID PRN Edgar White, Edgar Reno, MD   10 mg at 06/21/21 2100   hydrOXYzine (ATARAX/VISTARIL) tablet 50 mg  50 mg Oral TID PRN Edgar White, Edgar Reno, MD       loperamide (IMODIUM) capsule 2 mg  2 mg Oral Q4H PRN Edgar Scarlet, MD       magnesium hydroxide (MILK OF MAGNESIA) suspension 30 mL  30 mL Oral Daily PRN Edgar White, Edgar T, MD       metFORMIN (GLUCOPHAGE) tablet 500 mg  500 mg Oral BID WC Edgar White, Edgar T, MD   500 mg at 07/01/21 1610   mirtazapine (REMERON) tablet 15 mg  15 mg Oral QHS Edgar Ranger, DO       multivitamin with minerals tablet 1 tablet  1 tablet Oral Daily Edgar White, Edgar Reno, MD   1  tablet at 07/01/21 0928   ondansetron (ZOFRAN-ODT) disintegrating tablet 8 mg  8 mg Oral Q8H PRN Edgar Scarlet, MD   8 mg at 06/27/21 1219   traZODone (DESYREL) tablet 100 mg  100 mg Oral QHS PRN Edgar White, Edgar T, MD   100 mg at 06/29/21 2113   [START ON 07/02/2021] venlafaxine XR (EFFEXOR-XR) 24 hr capsule 75 mg  75 mg Oral Q breakfast Edgar Ranger, DO        Lab Results: No results found for this or any previous visit (from the past 48 hour(s)).  Blood Alcohol level:  Lab Results  Component Value Date   ETH <10 06/17/2021   ETH <10 96/11/5407    Metabolic Disorder Labs: Lab Results  Component Value Date   HGBA1C 6.1 (H) 06/20/2021   MPG 128.37 06/20/2021   MPG 119.76 02/02/2018   No results found for: PROLACTIN Lab Results  Component Value Date   CHOL 235 (H) 02/02/2018   TRIG 146 02/02/2018   HDL 35 (L) 02/02/2018   CHOLHDL 6.7 02/02/2018   VLDL 29 02/02/2018   LDLCALC 171 (H) 02/02/2018   LDLCALC 153 (H) 11/05/2016    Physical Findings: AIMS:  , ,  ,  ,    CIWA:    COWS:     Musculoskeletal: Strength & Muscle Tone: within normal limits Gait & Station: normal Patient leans: N/A  Psychiatric Specialty Exam:  Presentation  General Appearance: Casual  Eye Contact:Fair  Speech:Clear and Coherent  Speech Volume:Normal  Handedness:Right   Mood and Affect  Mood:Anxious; Depressed  Affect:Congruent   Thought Process  Thought Processes:Coherent  Descriptions of Associations:Intact  Orientation:Full (Time, Place and Person)  Thought Content:Logical  History of Schizophrenia/Schizoaffective disorder:No  Duration of Psychotic Symptoms:No data recorded Hallucinations:No data recorded Ideas of Reference:None  Suicidal Thoughts:No data recorded Homicidal Thoughts:No data recorded  Sensorium  Memory:Immediate Fair; Recent Fair; Remote Fair  Judgment:Intact  Insight:Good   Executive Functions  Concentration:Fair  Attention  Span:Fair  [place]   Psychomotor Activity  Psychomotor Activity:No data recorded  Assets  Assets:Communication Skills; Desire for Improvement; Financial Resources/Insurance; Housing; Resilience   Sleep  Sleep:No data recorded   Physical Exam: Physical Exam ROS Blood pressure 121/71, pulse 81, temperature 97.7 F (36.5 C), temperature source Oral, resp. rate 18, height 5\' 10"  (1.778 m), weight 68 kg, SpO2 99 %.  Body mass index is 21.52 kg/m.   Treatment Plan Summary: Medication management and Plan I discussed medication changes with him.  I will start him on Effexor XR 75 mg/day starting today and discontinue his Prozac.  I explained the risk side effects benefits of the medication changes.  He understands and agrees.  Due to his excessive fatigue when he wakes up we will going to increase his Remeron to 15 mg/day.  Edgar Ranger, DO 07/01/2021, 12:20 PM

## 2021-07-01 NOTE — Progress Notes (Signed)
Patient is alert , oriented calm and cooperative during assessment. Denies pain. Denies SI, HI, AVH. Denies being depressed. Verbalize anxiety rating 5/10. Compliant with medication. Remain safe in the unit with Q15 minute safety check.

## 2021-07-01 NOTE — BHH Counselor (Signed)
CSW spoke with staff member at [facility] [employer] in [place], [phone]. She confirmed that pt's sister, Marrianne Mood, has been in contact with staff.   CSW provided email address for ACTT referral to be sent for pt to fill out.  CSW will communicate with pt regarding referral and interest in starting in an ACTT program.   CSW will complete and fax referral upon receipt.   Hether Anselmo Martinique, MSW, LCSW-A 11/10/20221:11 PM

## 2021-07-01 NOTE — Progress Notes (Signed)
Recreation Therapy Notes  Date: 07/01/2021  Time: 2:05pm    Location: Dayroom   Behavioral response: Appropriate  Intervention Topic: Goals   Discussion/Intervention:  Group content on today was focused on goals. Patients described what goals are and how they define goals. Individuals expressed how they go about setting goals and reaching them. The group identified how important goals are and if they make short term goals to reach long term goals. Patients described how many goals they work on at a time and what affects them not reaching their goal. Individuals described how much time they put into planning and obtaining their goals. The group participated in the intervention "My Goal Board" and made personal goal boards to help them achieve their goal. Clinical Observations/Feedback: Patient came to group and expressed that his goals is to gain weight. He expressed that goals are important to keep yourself motivated.  Individual was social with peers and staff while participating in the intervention. Cyanna Neace LRT/CTRS         Dystany Duffy 07/01/2021 3:08 PM

## 2021-07-02 NOTE — Progress Notes (Signed)
[facility] MD Progress Note  07/02/2021 1:16 PM Edgar White  MRN:  [id number] Subjective: Edgar White continues to be depressed.  We switched his Prozac to Effexor XR yesterday.  He took it this morning.  He has no complaints.  He did call his brother but they have not spoken.  We also increased his Remeron to 15 mg at bedtime.  He states that he slept well.  He has been taking his medications as prescribed and denies any side effects.  He currently denies any suicidal ideation.  Unknown Principal Problem: Severe recurrent major depression without psychotic features ([place]) Diagnosis: Principal Problem:   Severe recurrent major depression without psychotic features ([place]) Active Problems:   Diabetes mellitus without complication ([place])  Total Time spent with patient: 20 minutes  Past Psychiatric History: Extensive history of depression.  Past Medical History:  Past Medical History:  Diagnosis Date   Depression    Diabetes mellitus without complication ([place])     Past Surgical History:  Procedure Laterality Date   INGUINAL HERNIA REPAIR     LUMBAR FUSION     Family History: History reviewed. No pertinent family history. Family Psychiatric  History: Unknown Social History:  Social History   Substance and Sexual Activity  Alcohol Use No   Comment: denies use     Social History   Substance and Sexual Activity  Drug Use Yes   Types: Marijuana, Cocaine    Social History   Socioeconomic History   Marital status: Single    Spouse name: Not on file   Number of children: Not on file   Years of education: Not on file   Highest education level: Not on file  Occupational History   Not on file  Tobacco Use   Smoking status: Every Day    Packs/day: 0.50    Types: Cigarettes   Smokeless tobacco: Never  Vaping Use   Vaping Use: Never used  Substance and Sexual Activity   Alcohol use: No    Comment: denies use   Drug use: Yes    Types: Marijuana, Cocaine   Sexual activity: Never   Other Topics Concern   Not on file  Social History Narrative   Not on file   Social Determinants of Health   Financial Resource Strain: Not on file  Food Insecurity: Not on file  Transportation Needs: Not on file  Physical Activity: Not on file  Stress: Not on file  Social Connections: Not on file   Additional Social History:                         Sleep: Good  Appetite: Improving  Current Medications: Current Facility-Administered Medications  Medication Dose Route Frequency Provider Last Rate Last Admin   acetaminophen (TYLENOL) tablet 650 mg  650 mg Oral Q6H PRN Clapacs, John T, MD   650 mg at 06/26/21 1112   alum & mag hydroxide-simeth (MAALOX/MYLANTA) 200-200-20 MG/5ML suspension 30 mL  30 mL Oral Q4H PRN Clapacs, John T, MD   30 mL at 06/25/21 2102   feeding supplement (ENSURE ENLIVE / ENSURE PLUS) liquid 237 mL  237 mL Oral TID BM Clapacs, John T, MD   237 mL at 07/02/21 1002   hydrOXYzine (ATARAX/VISTARIL) tablet 10 mg  10 mg Oral BID PRN Clapacs, Madie Reno, MD   10 mg at 06/21/21 2100   hydrOXYzine (ATARAX/VISTARIL) tablet 50 mg  50 mg Oral TID PRN Clapacs, Madie Reno, MD  loperamide (IMODIUM) capsule 2 mg  2 mg Oral Q4H PRN Salley Scarlet, MD       magnesium hydroxide (MILK OF MAGNESIA) suspension 30 mL  30 mL Oral Daily PRN Clapacs, John T, MD       metFORMIN (GLUCOPHAGE) tablet 500 mg  500 mg Oral BID WC Clapacs, John T, MD   500 mg at 07/02/21 0827   mirtazapine (REMERON) tablet 15 mg  15 mg Oral QHS Parks Ranger, DO   15 mg at 07/01/21 2237   multivitamin with minerals tablet 1 tablet  1 tablet Oral Daily Clapacs, Madie Reno, MD   1 tablet at 07/02/21 0955   ondansetron (ZOFRAN-ODT) disintegrating tablet 8 mg  8 mg Oral Q8H PRN Salley Scarlet, MD   8 mg at 07/02/21 1200   traZODone (DESYREL) tablet 100 mg  100 mg Oral QHS PRN Clapacs, John T, MD   100 mg at 06/29/21 2113   venlafaxine XR (EFFEXOR-XR) 24 hr capsule 75 mg  75 mg Oral Q breakfast  Parks Ranger, DO   75 mg at 07/02/21 7026    Lab Results: No results found for this or any previous visit (from the past 48 hour(s)).  Blood Alcohol level:  Lab Results  Component Value Date   ETH <10 06/17/2021   ETH <10 37/85/8850    Metabolic Disorder Labs: Lab Results  Component Value Date   HGBA1C 6.1 (H) 06/20/2021   MPG 128.37 06/20/2021   MPG 119.76 02/02/2018   No results found for: PROLACTIN Lab Results  Component Value Date   CHOL 235 (H) 02/02/2018   TRIG 146 02/02/2018   HDL 35 (L) 02/02/2018   CHOLHDL 6.7 02/02/2018   VLDL 29 02/02/2018   LDLCALC 171 (H) 02/02/2018   LDLCALC 153 (H) 11/05/2016    Physical Findings: AIMS:  , ,  ,  ,    CIWA:    COWS:     Musculoskeletal: Strength & Muscle Tone: Within normal limits Gait & Station: Within normal limits Patient leans: Not applicable  Psychiatric Specialty Exam:  Presentation  General Appearance: Casual  Eye Contact:Fair  Speech:Clear and Coherent  Speech Volume:Normal  Handedness:Right   Mood and Affect  Mood:Anxious; Depressed  Affect:Congruent Flat  Thought Process  Thought Processes:Coherent  Descriptions of Associations:Intact  Orientation:Full (Time, Place and Person)  Thought Content:Logical  History of Schizophrenia/Schizoaffective disorder:No  Duration of Psychotic Symptoms:No data recorded Hallucinations:No data recorded Ideas of Reference:None  Suicidal Thoughts:No data recorded currently denies Homicidal Thoughts:No data recorded none  Sensorium  Memory:Immediate Fair; Recent Fair; Remote Fair  Judgment:Intact  Insight:Good   Executive Functions  Concentration:Fair  Attention Span:Fair  [place]   Psychomotor Activity  Psychomotor Activity:No data recorded  Assets  Assets:Communication Skills; Desire for Improvement; Financial Resources/Insurance; Housing; Resilience   Sleep  Sleep:No  data recorded   Physical Exam: Physical Exam Review of Systems  Constitutional: Negative.   HENT: Negative.    Eyes: Negative.   Respiratory: Negative.    Cardiovascular: Negative.   Gastrointestinal: Negative.   Genitourinary: Negative.   Musculoskeletal: Negative.   Skin: Negative.   Neurological: Negative.   Endo/Heme/Allergies: Negative.   Psychiatric/Behavioral:  Positive for depression. The patient is nervous/anxious.   Blood pressure 119/69, pulse 84, temperature 98.2 F (36.8 C), temperature source Oral, resp. rate 20, height 5\' 10"  (1.778 m), weight 68 kg, SpO2 99 %. Body mass index is 21.52 kg/m.   Treatment Plan Summary:  Daily contact with patient to assess and evaluate symptoms and progress in treatment, Medication management, and Plan continue his current medications.  We changed him yesterday.  He is going to continue to try to get a hold of his brother.  [place], DO 07/02/2021, 1:16 PM

## 2021-07-02 NOTE — Progress Notes (Signed)
Recreation Therapy Notes  Date: 07/02/2021  Time: 1:05 pm    Location: Craft room    Behavioral response: Appropriate  Intervention Topic: Creative Expressions   Discussion/Intervention:  Group content on today was focused on creative expressions. The group defined creative expressions and ways they use creative expressions. Individual identified other positive ways creative expressions can be used and why it is important to express yourself. Patients participated in the intervention "expressive shading", where they had a chance to creatively express themselves. Clinical Observations/Feedback: Patient came to group and expressed that creative expressions means using your own style. Individual was social with peers and staff while participating in the intervention. Bernette Seeman LRT/CTRS         Jolleen Seman 07/02/2021 3:05 PM

## 2021-07-02 NOTE — Progress Notes (Signed)
Patient slept well through the night. Remain safe on the unit with q15 minute safety check.

## 2021-07-02 NOTE — BHH Counselor (Signed)
CSW sent fax referral for [employer], production) of [facility] for pt.   CSW contacted [phone] and received no answer. CSW left voicemail to staff regarding referral receipt and CSW's contact information.   CSW will follow up in a timely manner to follow up if fax was received.   Bienvenido Proehl Martinique, MSW, LCSW-A 11/11/20222:00 PM

## 2021-07-02 NOTE — Progress Notes (Signed)
Awake, alert and oriented x 4. Denies SI/HI/AVH. Pleasant affect; converses with peers in the dayroom watching television. No complaints voiced. offered snack at snack time, but only wanted apple juice. He did ask for his ensure and this was given to patient as noted in the [place]. Client went to his room around 2130 and is currently in bed awake. Safety measures maintained. Remains safe on the unit with q15 min safety checks.

## 2021-07-02 NOTE — Plan of Care (Signed)
Patient presents Flat and Depressed.  Pt continues to isolate. Pt denies SI/HI/AVH and contracts for safety.  Rates 4/10 Depression/Anxiety. Patient did comply will medications as ordered by Provider.  Patient denies any adverse affects from medications given.  Patient verbalized importance of compliance with treatment plan. Q15 minute safety rounding will continue. Pt in no apparent distress. Informed pt to notify nursing staff if any needs arise.     Problem: Education: Goal: Knowledge of [place] [facility] Education information/materials will improve Outcome: Progressing Goal: Mental status will improve Outcome: Progressing

## 2021-07-03 NOTE — Plan of Care (Signed)
Patient presents Anxious and Isolative. Pt denies SI/HI/AVH and contracts for safety on unit.  Pt did report "My anxiety has increased since I switched the medicine I feel stuck " Pt places his hands on the sides of his head. Nurse did educate pt of new medication adjustments. Patient given prn medication to manage Anxiety.  Patient encouraged to participate in therapeutic milieu with no resolve.  Patient denies pain.  Q 15 minute safety rounds will continue.  Informed pt to notify nursing staff if any needs arise.   Problem: Education: Goal: Knowledge of [place] [facility] Education information/materials will improve Outcome: Progressing Goal: Verbalization of understanding the information provided will improve Outcome: Progressing   Problem: Education: Goal: Emotional status will improve Outcome: Not Progressing   Problem: Activity: Goal: Interest or engagement in activities will improve Outcome: Not Progressing

## 2021-07-03 NOTE — Progress Notes (Signed)
[facility] MD Progress Note  07/03/2021 2:20 PM Edgar White  MRN:  [id number] Subjective:  Patient evaluate White his room today; he has been in bed most of the day and reports feeling " alright". He reports moderately depressed mood, is anxious and feels a bit off after medicine switch. He did get some sleep last night and ha slow appetite. He is denying current SI/HI or any AVH.  Principal Problem: Severe recurrent major depression without psychotic features ([place]) Diagnosis: Principal Problem:   Severe recurrent major depression without psychotic features ([place]) Active Problems:   Diabetes mellitus without complication ([place])  Total Time spent with patient: 20 minutes  Past Psychiatric History: MDD  Past Medical History:  Past Medical History:  Diagnosis Date   Depression    Diabetes mellitus without complication ([place])     Past Surgical History:  Procedure Laterality Date   INGUINAL HERNIA REPAIR     LUMBAR FUSION     Family History: History reviewed. No pertinent family history. Family Psychiatric  History:  Social History:  Social History   Substance and Sexual Activity  Alcohol Use No   Comment: denies use     Social History   Substance and Sexual Activity  Drug Use Yes   Types: Marijuana, Cocaine    Social History   Socioeconomic History   Marital status: Single    Spouse name: Not on file   Number of children: Not on file   Years of education: Not on file   Highest education level: Not on file  Occupational History   Not on file  Tobacco Use   Smoking status: Every Day    Packs/day: 0.50    Types: Cigarettes   Smokeless tobacco: Never  Vaping Use   Vaping Use: Never used  Substance and Sexual Activity   Alcohol use: No    Comment: denies use   Drug use: Yes    Types: Marijuana, Cocaine   Sexual activity: Never  Other Topics Concern   Not on file  Social History Narrative   Not on file   Social Determinants of Health   Financial Resource Strain:  Not on file  Food Insecurity: Not on file  Transportation Needs: Not on file  Physical Activity: Not on file  Stress: Not on file  Social Connections: Not on file   Additional Social History:                         Sleep: Good  Appetite:  Fair  Current Medications: Current Facility-Administered Medications  Medication Dose Route Frequency Provider Last Rate Last Admin   acetaminophen (TYLENOL) tablet 650 mg  650 mg Oral Q6H PRN Clapacs, John T, MD   650 mg at 06/26/21 1112   alum & mag hydroxide-simeth (MAALOX/MYLANTA) 200-200-20 MG/5ML suspension 30 mL  30 mL Oral Q4H PRN Clapacs, John T, MD   30 mL at 06/25/21 2102   feeding supplement (ENSURE ENLIVE / ENSURE PLUS) liquid 237 mL  237 mL Oral TID BM Clapacs, John T, MD   237 mL at 07/03/21 1328   hydrOXYzine (ATARAX/VISTARIL) tablet 10 mg  10 mg Oral BID PRN Clapacs, John T, MD   10 mg at 06/21/21 2100   hydrOXYzine (ATARAX/VISTARIL) tablet 50 mg  50 mg Oral TID PRN Clapacs, Madie Reno, MD   50 mg at 07/03/21 1325   loperamide (IMODIUM) capsule 2 mg  2 mg Oral Q4H PRN Salley Scarlet, MD  magnesium hydroxide (MILK OF MAGNESIA) suspension 30 mL  30 mL Oral Daily PRN Clapacs, John T, MD       metFORMIN (GLUCOPHAGE) tablet 500 mg  500 mg Oral BID WC Clapacs, John T, MD   500 mg at 07/03/21 0836   mirtazapine (REMERON) tablet 15 mg  15 mg Oral QHS Parks Ranger, DO   15 mg at 07/02/21 2112   multivitamin with minerals tablet 1 tablet  1 tablet Oral Daily Clapacs, Madie Reno, MD   1 tablet at 07/03/21 0929   ondansetron (ZOFRAN-ODT) disintegrating tablet 8 mg  8 mg Oral Q8H PRN Salley Scarlet, MD   8 mg at 07/02/21 1200   traZODone (DESYREL) tablet 100 mg  100 mg Oral QHS PRN Clapacs, John T, MD   100 mg at 06/29/21 2113   venlafaxine XR (EFFEXOR-XR) 24 hr capsule 75 mg  75 mg Oral Q breakfast Parks Ranger, DO   75 mg at 07/03/21 7169    Lab Results: No results found for this or any previous visit (from the  past 48 hour(s)).  Blood Alcohol level:  Lab Results  Component Value Date   ETH <10 06/17/2021   ETH <10 67/89/3810    Metabolic Disorder Labs: Lab Results  Component Value Date   HGBA1C 6.1 (H) 06/20/2021   MPG 128.37 06/20/2021   MPG 119.76 02/02/2018   No results found for: PROLACTIN Lab Results  Component Value Date   CHOL 235 (H) 02/02/2018   TRIG 146 02/02/2018   HDL 35 (L) 02/02/2018   CHOLHDL 6.7 02/02/2018   VLDL 29 02/02/2018   LDLCALC 171 (H) 02/02/2018   LDLCALC 153 (H) 11/05/2016    Physical Findings: AIMS:  , ,  ,  ,    CIWA:    COWS:     Musculoskeletal: Strength & Muscle Tone: within normal limits Gait & Station: normal Patient leans: N/A  Psychiatric Specialty Exam:  Presentation  General Appearance: Appropriate for Environment; Casual  Eye Contact:Fair  Speech:Clear and Coherent  Speech Volume:Decreased  Handedness:Right   Mood and Affect  Mood:Anxious; Depressed  Affect:Appropriate; Constricted   Thought Process  Thought Processes:Coherent  Descriptions of Associations:Intact  Orientation:Full (Time, Place and Person)  Thought Content:Illogical  History of Schizophrenia/Schizoaffective disorder:No  Duration of Psychotic Symptoms:No data recorded Hallucinations:Hallucinations: None Ideas of Reference:None  Suicidal Thoughts:Suicidal Thoughts: No Homicidal Thoughts:Homicidal Thoughts: No  Sensorium  Memory:Immediate Fair; Recent Fair  Judgment:Fair  Insight:Fair   Executive Functions  Concentration:Fair  Attention Span:Fair  [place]   Psychomotor Activity  Psychomotor Activity:Psychomotor Activity: Decreased  Assets  Assets:Communication Skills; Desire for Improvement; Housing   Sleep  Sleep:No data recorded   Physical Exam: Physical Exam Constitutional:      Appearance: Normal appearance. He is normal weight.  HENT:     Head: Normocephalic and  atraumatic.     Right Ear: Tympanic membrane normal.     Left Ear: Tympanic membrane normal.     Nose: Nose normal.     Mouth/Throat:     Mouth: Mucous membranes are moist.     Pharynx: Oropharynx is clear.  Eyes:     Extraocular Movements: Extraocular movements intact.     Conjunctiva/sclera: Conjunctivae normal.     Pupils: Pupils are equal, round, and reactive to light.  Cardiovascular:     Rate and Rhythm: Normal rate and regular rhythm.     Pulses: Normal pulses.     Heart sounds:  Normal heart sounds.  Pulmonary:     Effort: Pulmonary effort is normal.     Breath sounds: Normal breath sounds.  Abdominal:     General: Abdomen is flat. Bowel sounds are normal.  Musculoskeletal:        General: Normal range of motion.     Cervical back: Normal range of motion and neck supple.  Skin:    General: Skin is warm and dry.  Neurological:     General: No focal deficit present.     Mental Status: He is alert.   Review of Systems  Constitutional: Negative.   HENT: Negative.    Eyes: Negative.   Respiratory: Negative.    Cardiovascular: Negative.   Gastrointestinal: Negative.   Genitourinary: Negative.   Musculoskeletal: Negative.   Skin: Negative.   Neurological: Negative.   Endo/Heme/Allergies: Negative.   Psychiatric/Behavioral:  Positive for depression. The patient is nervous/anxious.   Blood pressure 122/73, pulse 81, temperature 97.8 F (36.6 C), temperature source Oral, resp. rate 18, height 5\' 10"  (1.778 m), weight 68 kg, SpO2 99 %. Body mass index is 21.52 kg/m.   Treatment Plan Summary: Daily contact with patient to assess and evaluate symptoms and progress in treatment, Medication management, and Plan : Continue Current meds.   Edgar White 07/03/2021, 2:20 PM

## 2021-07-03 NOTE — Group Note (Signed)
LCSW Group Therapy Note  Group Date: 07/03/2021 Start Time: 1430 End Time: 1530   Type of Therapy and Topic:  Group Therapy - Healthy vs Unhealthy Coping Skills  Participation Level:  Did Not Attend   Description of Group The focus of this group was to determine what unhealthy coping techniques typically are used by group members and what healthy coping techniques would be helpful in coping with various problems. Patients were guided in becoming aware of the differences between healthy and unhealthy coping techniques. Patients were asked to identify 2-3 healthy coping skills they would like to learn to use more effectively.  Therapeutic Goals Patients learned that coping is what human beings do all day long to deal with various situations in their lives Patients defined and discussed healthy vs unhealthy coping techniques Patients identified their preferred coping techniques and identified whether these were healthy or unhealthy Patients determined 2-3 healthy coping skills they would like to become more familiar with and use more often. Patients provided support and ideas to each other   Summary of Patient Progress: Patient did not attend group despite encouraged participation.   Therapeutic Modalities Cognitive Behavioral Therapy Motivational Interviewing  Berniece Salines, Latanya Presser 07/03/2021  4:05 PM

## 2021-07-03 NOTE — Progress Notes (Signed)
Awake, alert and oriented x 4. Denies SI/HI/AVH at present time. Offered snack and drink at snack time. Client was sitting in dayroom watching television for a couple hours and then went to room. Remains safe on the unit with q15 min safety checks.

## 2021-07-04 MED ORDER — VENLAFAXINE HCL ER 75 MG PO CP24
112.5000 mg | ORAL_CAPSULE | Freq: Every day | ORAL | Status: DC
  Administered 2021-07-05: 112.5 mg via ORAL
  Filled 2021-07-04: qty 1

## 2021-07-04 NOTE — Plan of Care (Signed)
Patient continues to present Anxious and Isolative.  Pt denies SI/HI/AVH and contracts for safety on unit.  Patient did comply with medications as ordered per Provider. Pt did complain of "grogginess from Remeron". Writer did educate pt that Provider did make na adjustment to current medication and will re evaluate.  Pt did complain of increased Anxiety and did request prn to manage symptoms.  Encourage pt to participate in therapeutic milieu.  Pt in no apparent distress.  Q 15 minute safety rounds will continue. Writer informed pt to notify nursing staff if any needs arise.   Problem: Education: Goal: Knowledge of [facility] Education information/materials will improve Outcome: Progressing Goal: Mental status will improve Outcome: Progressing Goal: Verbalization of understanding the information provided will improve Outcome: Progressing

## 2021-07-04 NOTE — Group Note (Signed)
LCSW Group Therapy Note  Group Date: 07/04/2021 Start Time: 1430 End Time: 1530   Type of Therapy and Topic:  Group Therapy - How To Cope with Nervousness about Discharge   Participation Level:  Active   Description of Group This process group involved identification of patients' feelings about discharge. Some of them are scheduled to be discharged soon, while others are new admissions, but each of them was asked to share thoughts and feelings surrounding discharge from the hospital. One common theme was that they are excited at the prospect of going home, while another was that many of them are apprehensive about sharing why they were hospitalized. Patients were given the opportunity to discuss these feelings with their peers in preparation for discharge.  Therapeutic Goals  Patient will identify their overall feelings about pending discharge. Patient will think about how they might proactively address issues that they believe will once again arise once they get home (i.e. with parents). Patients will participate in discussion about having hope for change.   Summary of Patient Progress:  Patient was very active throughout the session and engaged in group discussion. Patient shared that he feels apprehensive about discharge because he does not want to revert back to isolative behaviors. Patient shared how he recently had his medication adjusted and believes this change was helpful. Patient processed how the death of his parents led to experiencing depression and feelings of hopelessness. Patient was hopeful about managing depression symptoms after he discharges from the hospital, stating "I've gotta find that fight" to challenge unhelpful thinking patterns.  Therapeutic Modalities Cognitive Behavioral Therapy   Berniece Salines, Latanya Presser 07/04/2021  5:20 PM

## 2021-07-04 NOTE — BHH Group Notes (Signed)
3p-345pm  Group - Pt participated in group w/ soc worker in day room

## 2021-07-04 NOTE — Progress Notes (Signed)
[facility] MD Progress Note  07/04/2021 9:24 AM Edgar White  MRN:  [id number] Subjective:   Patient evaluated in his room this morning; he does not do breakfast and has been laying in bed. He reports feeling somewhat better today and rates his depression 3/10. He slept well last night and is denying any SI/HI or any AVH. He is willing to increase the dose of Effexor today.  Nursing Report : Patient presents Anxious and Isolative. Pt denies SI/HI/AVH and contracts for safety on unit.  Pt did report "My anxiety has increased since I switched the medicine I feel stuck " Pt places his hands on the sides of his head. Nurse did educate pt of new medication adjustments. Patient given prn medication to manage Anxiety.  Patient encouraged to participate in therapeutic milieu with no resolve.  Principal Problem: Severe recurrent major depression without psychotic features ([place]) Diagnosis: Principal Problem:   Severe recurrent major depression without psychotic features ([place]) Active Problems:   Diabetes mellitus without complication ([place])  Total Time spent with patient: 20 minutes  Past Psychiatric History: MDD  Past Medical History:  Past Medical History:  Diagnosis Date   Depression    Diabetes mellitus without complication ([place])     Past Surgical History:  Procedure Laterality Date   INGUINAL HERNIA REPAIR     LUMBAR FUSION     Family History: History reviewed. No pertinent family history. Family Psychiatric  History:  Social History:  Social History   Substance and Sexual Activity  Alcohol Use No   Comment: denies use     Social History   Substance and Sexual Activity  Drug Use Yes   Types: Marijuana, Cocaine    Social History   Socioeconomic History   Marital status: Single    Spouse name: Not on file   Number of children: Not on file   Years of education: Not on file   Highest education level: Not on file  Occupational History   Not on file  Tobacco Use   Smoking  status: Every Day    Packs/day: 0.50    Types: Cigarettes   Smokeless tobacco: Never  Vaping Use   Vaping Use: Never used  Substance and Sexual Activity   Alcohol use: No    Comment: denies use   Drug use: Yes    Types: Marijuana, Cocaine   Sexual activity: Never  Other Topics Concern   Not on file  Social History Narrative   Not on file   Social Determinants of Health   Financial Resource Strain: Not on file  Food Insecurity: Not on file  Transportation Needs: Not on file  Physical Activity: Not on file  Stress: Not on file  Social Connections: Not on file   Additional Social History:                         Sleep: Good  Appetite:  Fair  Current Medications: Current Facility-Administered Medications  Medication Dose Route Frequency Provider Last Rate Last Admin   acetaminophen (TYLENOL) tablet 650 mg  650 mg Oral Q6H PRN Clapacs, John T, MD   650 mg at 06/26/21 1112   alum & mag hydroxide-simeth (MAALOX/MYLANTA) 200-200-20 MG/5ML suspension 30 mL  30 mL Oral Q4H PRN Clapacs, John T, MD   30 mL at 06/25/21 2102   feeding supplement (ENSURE ENLIVE / ENSURE PLUS) liquid 237 mL  237 mL Oral TID BM Clapacs, Madie Reno, MD   237 mL  at 07/03/21 1328   hydrOXYzine (ATARAX/VISTARIL) tablet 10 mg  10 mg Oral BID PRN Clapacs, Madie Reno, MD   10 mg at 06/21/21 2100   hydrOXYzine (ATARAX/VISTARIL) tablet 50 mg  50 mg Oral TID PRN Clapacs, Madie Reno, MD   50 mg at 07/03/21 1325   loperamide (IMODIUM) capsule 2 mg  2 mg Oral Q4H PRN Salley Scarlet, MD       magnesium hydroxide (MILK OF MAGNESIA) suspension 30 mL  30 mL Oral Daily PRN Clapacs, John T, MD       metFORMIN (GLUCOPHAGE) tablet 500 mg  500 mg Oral BID WC Clapacs, John T, MD   500 mg at 07/04/21 0758   mirtazapine (REMERON) tablet 15 mg  15 mg Oral QHS Parks Ranger, DO   15 mg at 07/03/21 2124   multivitamin with minerals tablet 1 tablet  1 tablet Oral Daily Clapacs, Madie Reno, MD   1 tablet at 07/04/21 0906    ondansetron (ZOFRAN-ODT) disintegrating tablet 8 mg  8 mg Oral Q8H PRN Salley Scarlet, MD   8 mg at 07/02/21 1200   traZODone (DESYREL) tablet 100 mg  100 mg Oral QHS PRN Clapacs, John T, MD   100 mg at 06/29/21 2113   venlafaxine XR (EFFEXOR-XR) 24 hr capsule 75 mg  75 mg Oral Q breakfast Parks Ranger, DO   75 mg at 07/04/21 9629    Lab Results: No results found for this or any previous visit (from the past 48 hour(s)).  Blood Alcohol level:  Lab Results  Component Value Date   ETH <10 06/17/2021   ETH <10 52/84/1324    Metabolic Disorder Labs: Lab Results  Component Value Date   HGBA1C 6.1 (H) 06/20/2021   MPG 128.37 06/20/2021   MPG 119.76 02/02/2018   No results found for: PROLACTIN Lab Results  Component Value Date   CHOL 235 (H) 02/02/2018   TRIG 146 02/02/2018   HDL 35 (L) 02/02/2018   CHOLHDL 6.7 02/02/2018   VLDL 29 02/02/2018   LDLCALC 171 (H) 02/02/2018   LDLCALC 153 (H) 11/05/2016    Physical Findings: AIMS:  , ,  ,  ,    CIWA:    COWS:     Musculoskeletal: Strength & Muscle Tone: within normal limits Gait & Station: normal Patient leans: N/A  Psychiatric Specialty Exam:  Presentation  General Appearance: Appropriate for Environment; Casual  Eye Contact:Fair  Speech:Slow  Speech Volume:Decreased  Handedness:Right   Mood and Affect  Mood:Depressed; Anxious  Affect:Restricted   Thought Process  Thought Processes:Coherent  Descriptions of Associations:Intact  Orientation:Full (Time, Place and Person)  Thought Content:Logical  History of Schizophrenia/Schizoaffective disorder:No  Duration of Psychotic Symptoms:No data recorded Hallucinations:Hallucinations: None  Ideas of Reference:None  Suicidal Thoughts:Suicidal Thoughts: No  Homicidal Thoughts:Homicidal Thoughts: No   Sensorium  Memory:Immediate Fair; Remote Fair  Judgment:Fair  Insight:Fair   Executive Functions  Concentration:Fair  Attention  Span:Fair  [employer]   Psychomotor Activity  Psychomotor Activity:Psychomotor Activity: Normal   Assets  Assets:Communication Skills; Desire for Improvement; Housing   Sleep  Sleep:Sleep: Fair   Physical Exam: Physical Exam Constitutional:      Appearance: Normal appearance. He is normal weight.  HENT:     Head: Normocephalic and atraumatic.     Nose: Nose normal.     Mouth/Throat:     Mouth: Mucous membranes are moist.     Pharynx: Oropharynx is clear.  Eyes:  Extraocular Movements: Extraocular movements intact.     Conjunctiva/sclera: Conjunctivae normal.     Pupils: Pupils are equal, round, and reactive to light.  Cardiovascular:     Rate and Rhythm: Normal rate and regular rhythm.     Pulses: Normal pulses.     Heart sounds: Normal heart sounds.  Pulmonary:     Effort: Pulmonary effort is normal.     Breath sounds: Normal breath sounds.  Abdominal:     General: Abdomen is flat. Bowel sounds are normal.     Palpations: Abdomen is soft.  Musculoskeletal:        General: Normal range of motion.     Cervical back: Normal range of motion and neck supple.  Skin:    General: Skin is warm and dry.  Neurological:     General: No focal deficit present.     Mental Status: He is alert.  Psychiatric:        Behavior: Behavior normal.   Review of Systems  Constitutional: Negative.   HENT: Negative.    Eyes: Negative.   Respiratory: Negative.    Cardiovascular: Negative.   Gastrointestinal: Negative.   Genitourinary: Negative.   Musculoskeletal: Negative.   Skin: Negative.   Neurological: Negative.   Endo/Heme/Allergies: Negative.   Psychiatric/Behavioral:  Positive for depression. The patient is nervous/anxious.   Blood pressure 130/79, pulse 75, temperature 98 F (36.7 C), temperature source Oral, resp. rate 20, height 5\' 10"  (1.778 m), weight 68 kg, SpO2 100 %. Body mass index is 21.52 kg/m.   Treatment Plan  Summary: Daily contact with patient to assess and evaluate symptoms and progress in treatment, Medication management, and Plan : Increase Effexor dose.   Edgar White 07/04/2021, 9:24 AM

## 2021-07-05 MED ORDER — VENLAFAXINE HCL ER 75 MG PO CP24
150.0000 mg | ORAL_CAPSULE | Freq: Every day | ORAL | Status: DC
  Administered 2021-07-06 – 2021-07-09 (×4): 150 mg via ORAL
  Filled 2021-07-05 (×4): qty 2

## 2021-07-05 NOTE — Progress Notes (Signed)
Patient is calm and cooperative with assessment. He denies SI, HI, and AVH. He denies pain and any other physical complaint. Patient states he is feeling better on the Effexor. Patient was up for breakfast and spent time in the dayroom before returning to his room. Patient reports ok sleep and appetite. He states that he has worked out the issues with his siblings and that he will return to his brother's when he discharges from the hospital. Patient is compliant with medications. Patient remains safe on the unit at this time.

## 2021-07-05 NOTE — BHH Counselor (Addendum)
CSW called Edgar White at [employer], [phone], CSW left voicemail regarding referral update.   CSW will follow up tomorrow regarding referral and whether they are in network with [facility].   Glinda Natzke [place], MSW, LCSW-A 11/14/20223:50 PM

## 2021-07-05 NOTE — Group Note (Signed)
[facility] LCSW Group Therapy Note    Group Date: 07/05/2021 Start Time: 1829 End Time: 1500  Type of Therapy and Topic:  Group Therapy:  Overcoming Obstacles  Participation Level:  BHH PARTICIPATION LEVEL: Active  Mood:  Description of Group:   In this group patients will be encouraged to explore what they see as obstacles to their own wellness and recovery. They will be guided to discuss their thoughts, feelings, and behaviors related to these obstacles. The group will process together ways to cope with barriers, with attention given to specific choices patients can make. Each patient will be challenged to identify changes they are motivated to make in order to overcome their obstacles. This group will be process-oriented, with patients participating in exploration of their own experiences as well as giving and receiving support and challenge from other group members.  Therapeutic Goals: 1. Patient will identify personal and current obstacles as they relate to admission. 2. Patient will identify barriers that currently interfere with their wellness or overcoming obstacles.  3. Patient will identify feelings, thought process and behaviors related to these barriers. 4. Patient will identify two changes they are willing to make to overcome these obstacles:    Summary of Patient Progress   Patient was present for the entirety of the group session. Patient was an active listener, participated  in the topic of discussion, provided helpful advice to others, was appropriate to group members, and added nuance to topic of conversation. He stated that he has started to overcome the discomfort around sharing his feelings with a professional in therapy. He stated that talking with other group members during his previous groups started to make him feel more comfortable. He said he wants to continue to talk in therapy when he goes home because he feels he no  longer knows himself and needs professional help with that intrapersonal dynamic.     Therapeutic Modalities:   Cognitive Behavioral Therapy Solution Focused Therapy Motivational Interviewing Relapse Prevention Therapy   Francine Hannan A [place], LCSWA

## 2021-07-05 NOTE — BHH Group Notes (Signed)
Pt attended Group in [facility] room 14:30-15:00

## 2021-07-05 NOTE — BHH Counselor (Signed)
CSW spoke with pt who stated that he can come back to live with his brother.   He stated he wants to start his services with [facility] ([facility]). Acceptance of referral has not been accepted yet. CSW to follow up with [facility] team.   He also stated he would continue with [facility] and request CST level services and stated he was interested in engaging this time.   CSW will continue to check in with pt regarding discharge/aftercare planning.   Marcella Charlson [place], MSW, LCSW-A 11/14/20223:33 PM

## 2021-07-05 NOTE — Progress Notes (Signed)
Awake , alert, oriented x 4. Denies SI/HI/AVH. Compliant with medications. Denies any complaints at this time. Remains safe on the unit with q 15 min safety checks.

## 2021-07-05 NOTE — Progress Notes (Signed)
[facility] MD Progress Note  07/05/2021 12:00 PM Edgar White  MRN:  [id number] Subjective: Edgar White is doing better today.  The on-call doctor increase his Effexor XR to 112.5 mg.  He feels that the change in medication has been helpful.  He is taking his medication as prescribed denies any side effects.  His appetite is slowly improving.  His sleep is improving.  Currently denies any suicidal ideation.  We discussed going up on his Effexor before he leaves. Principal Problem: Severe recurrent major depression without psychotic features ([place]) Diagnosis: Principal Problem:   Severe recurrent major depression without psychotic features ([place]) Active Problems:   Diabetes mellitus without complication ([place])  Total Time spent with patient: 15 minutes  Past Psychiatric History: Extensive  Past Medical History:  Past Medical History:  Diagnosis Date   Depression    Diabetes mellitus without complication ([place])     Past Surgical History:  Procedure Laterality Date   INGUINAL HERNIA REPAIR     LUMBAR FUSION     Family History: History reviewed. No pertinent family history. Family Psychiatric  History: Unknown Social History:  Social History   Substance and Sexual Activity  Alcohol Use No   Comment: denies use     Social History   Substance and Sexual Activity  Drug Use Yes   Types: Marijuana, Cocaine    Social History   Socioeconomic History   Marital status: Single    Spouse name: Not on file   Number of children: Not on file   Years of education: Not on file   Highest education level: Not on file  Occupational History   Not on file  Tobacco Use   Smoking status: Every Day    Packs/day: 0.50    Types: Cigarettes   Smokeless tobacco: Never  Vaping Use   Vaping Use: Never used  Substance and Sexual Activity   Alcohol use: No    Comment: denies use   Drug use: Yes    Types: Marijuana, Cocaine   Sexual activity: Never  Other Topics Concern   Not on file  Social History  Narrative   Not on file   Social Determinants of Health   Financial Resource Strain: Not on file  Food Insecurity: Not on file  Transportation Needs: Not on file  Physical Activity: Not on file  Stress: Not on file  Social Connections: Not on file   Additional Social History:                         Sleep: Good  Appetite:  Good  Current Medications: Current Facility-Administered Medications  Medication Dose Route Frequency Provider Last Rate Last Admin   acetaminophen (TYLENOL) tablet 650 mg  650 mg Oral Q6H PRN Clapacs, John T, MD   650 mg at 06/26/21 1112   alum & mag hydroxide-simeth (MAALOX/MYLANTA) 200-200-20 MG/5ML suspension 30 mL  30 mL Oral Q4H PRN Clapacs, John T, MD   30 mL at 06/25/21 2102   feeding supplement (ENSURE ENLIVE / ENSURE PLUS) liquid 237 mL  237 mL Oral TID BM Clapacs, John T, MD   237 mL at 07/05/21 0920   hydrOXYzine (ATARAX/VISTARIL) tablet 10 mg  10 mg Oral BID PRN Clapacs, John T, MD   10 mg at 06/21/21 2100   hydrOXYzine (ATARAX/VISTARIL) tablet 50 mg  50 mg Oral TID PRN Clapacs, Madie Reno, MD   50 mg at 07/04/21 1217   loperamide (IMODIUM) capsule 2 mg  2 mg Oral Q4H PRN Salley Scarlet, MD       magnesium hydroxide (MILK OF MAGNESIA) suspension 30 mL  30 mL Oral Daily PRN Clapacs, John T, MD       metFORMIN (GLUCOPHAGE) tablet 500 mg  500 mg Oral BID WC Clapacs, John T, MD   500 mg at 07/05/21 0816   mirtazapine (REMERON) tablet 15 mg  15 mg Oral QHS Parks Ranger, DO   15 mg at 07/04/21 2125   multivitamin with minerals tablet 1 tablet  1 tablet Oral Daily Clapacs, Madie Reno, MD   1 tablet at 07/05/21 0919   ondansetron (ZOFRAN-ODT) disintegrating tablet 8 mg  8 mg Oral Q8H PRN Salley Scarlet, MD   8 mg at 07/02/21 1200   traZODone (DESYREL) tablet 100 mg  100 mg Oral QHS PRN Clapacs, John T, MD   100 mg at 06/29/21 2113   venlafaxine XR (EFFEXOR-XR) 24 hr capsule 112.5 mg  112.5 mg Oral Q breakfast Malik, Kashif   112.5 mg at  07/05/21 2563    Lab Results: No results found for this or any previous visit (from the past 48 hour(s)).  Blood Alcohol level:  Lab Results  Component Value Date   ETH <10 06/17/2021   ETH <10 89/37/3428    Metabolic Disorder Labs: Lab Results  Component Value Date   HGBA1C 6.1 (H) 06/20/2021   MPG 128.37 06/20/2021   MPG 119.76 02/02/2018   No results found for: PROLACTIN Lab Results  Component Value Date   CHOL 235 (H) 02/02/2018   TRIG 146 02/02/2018   HDL 35 (L) 02/02/2018   CHOLHDL 6.7 02/02/2018   VLDL 29 02/02/2018   LDLCALC 171 (H) 02/02/2018   LDLCALC 153 (H) 11/05/2016    Physical Findings: AIMS:  , ,  ,  ,    CIWA:    COWS:     Musculoskeletal: Strength & Muscle Tone: within normal limits Gait & Station: normal Patient leans: N/A  Psychiatric Specialty Exam:  Presentation  General Appearance: Appropriate for Environment; Casual  Eye Contact:Fair  Speech:Slow  Speech Volume:Decreased  Handedness:Right   Mood and Affect  Mood:Depressed; Anxious  Affect:Restricted   Thought Process  Thought Processes:Coherent  Descriptions of Associations:Intact  Orientation:Full (Time, Place and Person)  Thought Content:Logical  History of Schizophrenia/Schizoaffective disorder:No  Duration of Psychotic Symptoms:No data recorded Hallucinations:Hallucinations: None  Ideas of Reference:None  Suicidal Thoughts:Suicidal Thoughts: No  Homicidal Thoughts:Homicidal Thoughts: No   Sensorium  Memory:Immediate Fair; Remote Fair  Judgment:Fair  Insight:Fair   Executive Functions  Concentration:Fair  Attention Span:Fair  [place]   Psychomotor Activity  Psychomotor Activity:Psychomotor Activity: Normal   Assets  Assets:Communication Skills; Desire for Improvement; Housing   Sleep  Sleep:Sleep: Fair    Physical Exam: Physical Exam Vitals and nursing note reviewed.   Constitutional:      Appearance: Normal appearance.  HENT:     Head: Normocephalic and atraumatic.     Nose: Nose normal.     Mouth/Throat:     Pharynx: Oropharynx is clear.  Eyes:     Extraocular Movements: Extraocular movements intact.     Pupils: Pupils are equal, round, and reactive to light.  Cardiovascular:     Rate and Rhythm: Normal rate and regular rhythm.  Pulmonary:     Effort: Pulmonary effort is normal.     Breath sounds: Normal breath sounds.  Abdominal:     General: Abdomen is flat. Bowel  sounds are normal.     Palpations: Abdomen is soft.  Musculoskeletal:        General: Normal range of motion.     Cervical back: Normal range of motion and neck supple.  Neurological:     General: No focal deficit present.     Mental Status: He is alert and oriented to person, place, and time.  Psychiatric:        Attention and Perception: Attention and perception normal.        Mood and Affect: Mood is anxious and depressed.        Speech: Speech normal.        Behavior: Behavior is cooperative.        Thought Content: Thought content normal.        Cognition and Memory: Cognition and memory normal.        Judgment: Judgment normal.   Review of Systems  Constitutional: Negative.   HENT: Negative.    Eyes: Negative.   Respiratory: Negative.    Cardiovascular: Negative.   Gastrointestinal: Negative.   Genitourinary: Negative.   Musculoskeletal: Negative.   Skin: Negative.   Neurological: Negative.   Endo/Heme/Allergies: Negative.   Psychiatric/Behavioral:  Positive for depression. The patient is nervous/anxious.   Blood pressure 116/71, pulse 92, temperature 97.6 F (36.4 C), temperature source Oral, resp. rate 18, height 5\' 10"  (1.778 m), weight 68 kg, SpO2 99 %. Body mass index is 21.52 kg/m.   Treatment Plan Summary: Daily contact with patient to assess and evaluate symptoms and progress in treatment, Medication management, and Plan : Continue current  medications.  We will go up on his Effexor XR 150 mg starting tomorrow morning.  [place], DO 07/05/2021, 12:00 PM

## 2021-07-06 NOTE — BHH Counselor (Signed)
CSW spoke with pt's sister, Edgar White, discussing pt's discharge plan. She stated that she is not able to get in touch with [facility] program. CSW stated that there has been no return call from [facility] as well.   She stated that as of now, pt is still not allowed back at brother's home, though pt is under the impression that he can come back if he gets into this program.   She stated she will follow up with pt regarding referral to [facility] and home placement.   CSW informed pt's sister that as of now, discharge would be scheduled for this Friday November 18th.   No other requests were made.   Edgar White, MSW, LCSW-A 11/15/20224:28 PM

## 2021-07-06 NOTE — Progress Notes (Signed)
Edgar Quanah MD Progress Note  07/06/2021 11:53 AM Edgar White  MRN:  [id number] Subjective: Edgar White states that Edgar is feeling better.  Edgar spoke to his brother last Friday on the phone and they would let him come home.  Going to do outpatient in [place] at psychiatric services.  Or, Edgar may go to [place]. Edgar is taking his medications as prescribed denies any side effects.  Edgar did have a little difficulty sleeping last night but Edgar did not take trazodone. Principal Problem: Severe recurrent major depression without psychotic features ([place]) Diagnosis: Principal Problem:   Severe recurrent major depression without psychotic features ([place]) Active Problems:   Diabetes mellitus without complication ([place])  Total Time spent with patient: 15 minutes  Past Psychiatric History: Yes  Past Medical History:  Past Medical History:  Diagnosis Date   Depression    Diabetes mellitus without complication ([place])     Past Surgical History:  Procedure Laterality Date   INGUINAL HERNIA REPAIR     LUMBAR FUSION     Family History: History reviewed. No pertinent family history. Family Psychiatric  History: Unremarkable Social History:  Social History   Substance and Sexual Activity  Alcohol Use No   Comment: denies use     Social History   Substance and Sexual Activity  Drug Use Yes   Types: Marijuana, Cocaine    Social History   Socioeconomic History   Marital status: Single    Spouse name: Not on file   Number of children: Not on file   Years of education: Not on file   Highest education level: Not on file  Occupational History   Not on file  Tobacco Use   Smoking status: Every Day    Packs/day: 0.50    Types: Cigarettes   Smokeless tobacco: Never  Vaping Use   Vaping Use: Never used  Substance and Sexual Activity   Alcohol use: No    Comment: denies use   Drug use: Yes    Types: Marijuana, Cocaine   Sexual activity: Never  Other Topics Concern   Not on file  Social History  Narrative   Not on file   Social Determinants of Health   Financial Resource Strain: Not on file  Food Insecurity: Not on file  Transportation Needs: Not on file  Physical Activity: Not on file  Stress: Not on file  Social Connections: Not on file   Additional Social History:                         Sleep: Good  Appetite:  Good  Current Medications: Current Facility-Administered Medications  Medication Dose Route Frequency Provider Last Rate Last Admin   acetaminophen (TYLENOL) tablet 650 mg  650 mg Oral Q6H PRN Clapacs, John T, MD   650 mg at 06/26/21 1112   alum & mag hydroxide-simeth (MAALOX/MYLANTA) 200-200-20 MG/5ML suspension 30 mL  30 mL Oral Q4H PRN Clapacs, John T, MD   30 mL at 06/25/21 2102   feeding supplement (ENSURE ENLIVE / ENSURE PLUS) liquid 237 mL  237 mL Oral TID BM Clapacs, John T, MD   237 mL at 07/05/21 2120   hydrOXYzine (ATARAX/VISTARIL) tablet 10 mg  10 mg Oral BID PRN Clapacs, John T, MD   10 mg at 06/21/21 2100   hydrOXYzine (ATARAX/VISTARIL) tablet 50 mg  50 mg Oral TID PRN Clapacs, Madie Reno, MD   50 mg at 07/05/21 1223   loperamide (IMODIUM) capsule  2 mg  2 mg Oral Q4H PRN Salley Scarlet, MD       magnesium hydroxide (MILK OF MAGNESIA) suspension 30 mL  30 mL Oral Daily PRN Clapacs, John T, MD       metFORMIN (GLUCOPHAGE) tablet 500 mg  500 mg Oral BID WC Clapacs, Madie Reno, MD   500 mg at 07/06/21 2330   mirtazapine (REMERON) tablet 15 mg  15 mg Oral QHS Parks Ranger, DO   15 mg at 07/05/21 2120   multivitamin with minerals tablet 1 tablet  1 tablet Oral Daily Clapacs, Madie Reno, MD   1 tablet at 07/06/21 0833   ondansetron (ZOFRAN-ODT) disintegrating tablet 8 mg  8 mg Oral Q8H PRN Salley Scarlet, MD   8 mg at 07/02/21 1200   traZODone (DESYREL) tablet 100 mg  100 mg Oral QHS PRN Clapacs, John T, MD   100 mg at 06/29/21 2113   venlafaxine XR (EFFEXOR-XR) 24 hr capsule 150 mg  150 mg Oral Q breakfast Parks Ranger, DO   150 mg  at 07/06/21 0762    Lab Results: No results found for this or any previous visit (from the past 48 hour(s)).  Blood Alcohol level:  Lab Results  Component Value Date   ETH <10 06/17/2021   ETH <10 26/33/3545    Metabolic Disorder Labs: Lab Results  Component Value Date   HGBA1C 6.1 (H) 06/20/2021   MPG 128.37 06/20/2021   MPG 119.76 02/02/2018   No results found for: PROLACTIN Lab Results  Component Value Date   CHOL 235 (H) 02/02/2018   TRIG 146 02/02/2018   HDL 35 (L) 02/02/2018   CHOLHDL 6.7 02/02/2018   VLDL 29 02/02/2018   LDLCALC 171 (H) 02/02/2018   LDLCALC 153 (H) 11/05/2016    Physical Findings: AIMS:  , ,  ,  ,    CIWA:    COWS:     Musculoskeletal: Strength & Muscle Tone: within normal limits Gait & Station: normal Patient leans: N/A  Psychiatric Specialty Exam:  Presentation  General Appearance: Appropriate for Environment; Casual  Eye Contact:Fair  Speech:Slow  Speech Volume:Decreased  Handedness:Right   Mood and Affect  Mood:Depressed; Anxious  Affect:Restricted   Thought Process  Thought Processes:Coherent  Descriptions of Associations:Intact  Orientation:Full (Time, Place and Person)  Thought Content:Logical  History of Schizophrenia/Schizoaffective disorder:No  Duration of Psychotic Symptoms:No data recorded Hallucinations:No data recorded Ideas of Reference:None  Suicidal Thoughts:No data recorded Homicidal Thoughts:No data recorded  Sensorium  Memory:Immediate Fair; Remote Fair  Judgment:Fair  Insight:Fair   Executive Functions  Concentration:Fair  Attention Span:Fair  [place]   Psychomotor Activity  Psychomotor Activity:No data recorded  Assets  Assets:Communication Skills; Desire for Improvement; Housing   Sleep  Sleep:No data recorded   Physical Exam: Physical Exam Constitutional:      Appearance: Normal appearance.  HENT:     Head:  Normocephalic and atraumatic.     Mouth/Throat:     Pharynx: Oropharynx is clear.  Eyes:     Extraocular Movements: Extraocular movements intact.     Pupils: Pupils are equal, round, and reactive to light.  Cardiovascular:     Rate and Rhythm: Normal rate and regular rhythm.  Pulmonary:     Effort: Pulmonary effort is normal.     Breath sounds: Normal breath sounds.  Abdominal:     General: Bowel sounds are normal.     Palpations: Abdomen is soft.  Musculoskeletal:  General: Normal range of motion.     Cervical back: Normal range of motion and neck supple.  Skin:    General: Skin is warm and dry.  Neurological:     General: No focal deficit present.     Mental Status: Edgar is alert and oriented to person, place, and time.  Psychiatric:        Attention and Perception: Attention normal.        Mood and Affect: Mood is anxious and depressed. Affect is flat.        Speech: Speech normal.        Behavior: Behavior normal. Behavior is cooperative.        Thought Content: Thought content normal.        Cognition and Memory: Cognition normal.        Judgment: Judgment normal.   Review of Systems  Constitutional: Negative.   HENT: Negative.    Eyes: Negative.   Respiratory: Negative.    Cardiovascular: Negative.   Gastrointestinal: Negative.   Genitourinary: Negative.   Musculoskeletal: Negative.   Skin: Negative.   Neurological: Negative.   Endo/Heme/Allergies: Negative.   Psychiatric/Behavioral:  Positive for depression. The patient is nervous/anxious.   Blood pressure 124/82, pulse 81, temperature 97.8 F (36.6 C), temperature source Oral, resp. rate 18, height 5\' 10"  (1.778 m), weight 68 kg, SpO2 100 %. Body mass index is 21.52 kg/m.   Treatment Plan Summary: Daily contact with patient to assess and evaluate symptoms and progress in treatment, Medication management, and Plan :continue current medications.  Plan on discharge later this week.   Parks Ranger, DO 07/06/2021, 11:53 AM

## 2021-07-06 NOTE — Progress Notes (Signed)
Pt standing at nurses' station; anxious, cooperative. Pt states "I'm a little more relaxed." Pt expressed feelings of anxiety due to pending discharge on Friday. Pt encouraged to not let discharge on Friday take away today's or upcoming days joy; pt agreed. Pt denies pain and SI/HI/AVH at this time. Pt states that he sleeps "good at first" then wakes up and has a hard time falling back to sleep. Pt describes his appetite as "not good" and that he has only eaten rice, brown gravy and ensure" for the majority of the time since his admission because he wears dentures and he is unable to eat well with them. Pt declined offer for soft foods. He stated that he will probably get new dentures the first chance he gets because "they're about $1800 at [facility]." Pt talked about how he is unable to enjoy foods because he is unable to chew the food and he does not cook as much as he used to because he does not eat as much. Pt states that he has lost about 30 pounds since he got his dentures a year ago. Pt encouraged to take his dentures out at night while sleeping, as he reports that he has not taken them out since being admitted. Pt expresses feelings of anxiety, which he rates 6/10 and depression, which he rates 4/10 due to discharge on Friday and being unable to eat food like he wants. No acute distress noted.

## 2021-07-06 NOTE — Group Note (Addendum)
[facility] LCSW Group Therapy Note   Group Date: 07/06/2021 Start Time: 1400 End Time: 1430  Type of Therapy/Topic:  Group Therapy:  Feelings about Diagnosis  Participation Level:  Minimal      Description of Group:    This group will allow patients to explore their thoughts and feelings about diagnoses they have received. Patients will be guided to explore their level of understanding and acceptance of these diagnoses. Facilitator will encourage patients to process their thoughts and feelings about the reactions of others to their diagnosis, and will guide patients in identifying ways to discuss their diagnosis with significant others in their lives. This group will be process-oriented, with patients participating in exploration of their own experiences as well as giving and receiving support and challenge from other group members.   Therapeutic Goals: 1. Patient will demonstrate understanding of diagnosis as evidence by identifying two or more symptoms of the disorder:  2. Patient will be able to express two feelings regarding the diagnosis 3. Patient will demonstrate ability to communicate their needs through discussion and/or role plays  Summary of Patient Progress:    Patient was present for the individual session. Patient was an active listener and participated in the topic of discussion. He stated he was looking forward to going home and making an effort. He states that he has some nervousness about trying something new like therapy but feels that it is what he needs. He stated that he feels scared he'll "be all over the place" but does not want to hold onto to his "resentment and anger" towards his family. He stated he feels pressure from his family to make this therapy work. He was able to demonstrate communicating his needs through discussion with CSW.     Therapeutic Modalities:   Cognitive Behavioral Therapy Brief Therapy Feelings Identification    Mamadou Breon A [place], LCSWA

## 2021-07-06 NOTE — Progress Notes (Signed)
Patient pleasant and cooperative. Medication compliant. Appropriate with staff and peers. Denies SI, HI, AVH. Declined sleep aide. Rested well in no distress. Visible in milieu. Good visit with sister this evening. Noted smiling and talking. Encouragement and support provided. Safety checks maintained. Medication given as prescribed. Pt receptive and remains safe on unit with q 15 min checks.

## 2021-07-06 NOTE — Plan of Care (Signed)
  Problem: Education: Goal: Mental status will improve Outcome: Progressing   Problem: Health Behavior/Discharge Planning: Goal: Compliance with treatment plan for underlying cause of condition will improve Outcome: Progressing   Problem: Safety: Goal: Periods of time without injury will increase Outcome: Progressing   Problem: Safety: Goal: Ability to disclose and discuss suicidal ideas will improve Outcome: Progressing

## 2021-07-06 NOTE — Progress Notes (Signed)
Recreation Therapy Notes  Date: 07/06/2021  Time: 1:15 pm    Location: Craft room    Behavioral response: N/A   Intervention Topic: Self-care   Discussion/Intervention: Patient did not attend group.   Clinical Observations/Feedback:  Patient did not attend group.   Norvella Loscalzo LRT/CTRS          Amol Domanski 07/06/2021 4:07 PM

## 2021-07-06 NOTE — Progress Notes (Signed)
Patient refused to get up for breakfast but said that he would get up later in the morning. Patient states he did not sleep as well as he did the previous night and does not have an appetite for breakfast. Patient is calm and cooperative with assessment. He denies SI, HI, and AVH. Patient remains safe on the unit at this time.

## 2021-07-06 NOTE — BH IP Treatment Plan (Signed)
Interdisciplinary Treatment and Diagnostic Plan Update  07/06/2021 Time of Session: 9:30am Edgar White MRN: [id number]  Principal Diagnosis: Severe recurrent major depression without psychotic features [facility])  Secondary Diagnoses: Principal Problem:   Severe recurrent major depression without psychotic features ([place]) Active Problems:   Diabetes mellitus without complication ([place])   Current Medications:  Current Facility-Administered Medications  Medication Dose Route Frequency Provider Last Rate Last Admin   acetaminophen (TYLENOL) tablet 650 mg  650 mg Oral Q6H PRN Clapacs, John T, MD   650 mg at 06/26/21 1112   alum & mag hydroxide-simeth (MAALOX/MYLANTA) 200-200-20 MG/5ML suspension 30 mL  30 mL Oral Q4H PRN Clapacs, John T, MD   30 mL at 06/25/21 2102   feeding supplement (ENSURE ENLIVE / ENSURE PLUS) liquid 237 mL  237 mL Oral TID BM Clapacs, John T, MD   237 mL at 07/06/21 1207   hydrOXYzine (ATARAX/VISTARIL) tablet 10 mg  10 mg Oral BID PRN Clapacs, John T, MD   10 mg at 06/21/21 2100   hydrOXYzine (ATARAX/VISTARIL) tablet 50 mg  50 mg Oral TID PRN Clapacs, John T, MD   50 mg at 07/05/21 1223   loperamide (IMODIUM) capsule 2 mg  2 mg Oral Q4H PRN Salley Scarlet, MD       magnesium hydroxide (MILK OF MAGNESIA) suspension 30 mL  30 mL Oral Daily PRN Clapacs, John T, MD       metFORMIN (GLUCOPHAGE) tablet 500 mg  500 mg Oral BID WC Clapacs, John T, MD   500 mg at 07/06/21 5102   mirtazapine (REMERON) tablet 15 mg  15 mg Oral QHS Parks Ranger, DO   15 mg at 07/05/21 2120   multivitamin with minerals tablet 1 tablet  1 tablet Oral Daily Clapacs, Madie Reno, MD   1 tablet at 07/06/21 0833   ondansetron (ZOFRAN-ODT) disintegrating tablet 8 mg  8 mg Oral Q8H PRN Salley Scarlet, MD   8 mg at 07/02/21 1200   traZODone (DESYREL) tablet 100 mg  100 mg Oral QHS PRN Clapacs, John T, MD   100 mg at 06/29/21 2113   venlafaxine XR (EFFEXOR-XR) 24 hr capsule 150 mg  150 mg Oral Q  breakfast Parks Ranger, DO   150 mg at 07/06/21 5852   PTA Medications: Medications Prior to Admission  Medication Sig Dispense Refill Last Dose   desvenlafaxine (PRISTIQ) 50 MG 24 hr tablet Take 50 mg by mouth 2 (two) times daily.      hydrOXYzine (ATARAX/VISTARIL) 10 MG tablet Take 10 mg by mouth 2 (two) times daily as needed for anxiety.      metFORMIN (GLUCOPHAGE) 500 MG tablet Take 1 tablet (500 mg total) by mouth 2 (two) times daily with a meal. (Patient not taking: No sig reported) 60 tablet 1    QUEtiapine (SEROQUEL) 100 MG tablet Take 1 tablet (100 mg total) by mouth at bedtime. (Patient not taking: No sig reported) 30 tablet 1    sertraline (ZOLOFT) 100 MG tablet Take 1 tablet (100 mg total) by mouth at bedtime. (Patient not taking: No sig reported) 30 tablet 1     Patient Stressors: Financial difficulties   Loss of both parents   Other: fear of homelessness    Patient Strengths: Motivation for treatment/growth  Physical Health   Treatment Modalities: Medication Management, Group therapy, Case management,  1 to 1 session with clinician, Psychoeducation, Recreational therapy.   Physician Treatment Plan for Primary Diagnosis: Severe recurrent major depression  without psychotic features ([place]) Long Term Goal(s): Improvement in symptoms so as ready for discharge   Short Term Goals: Ability to identify changes in lifestyle to reduce recurrence of condition will improve Ability to verbalize feelings will improve Ability to disclose and discuss suicidal ideas Ability to demonstrate self-control will improve Ability to identify and develop effective coping behaviors will improve Ability to maintain clinical measurements within normal limits will improve Compliance with prescribed medications will improve Ability to identify triggers associated with substance abuse/mental health issues will improve  Medication Management: Evaluate patient's response, side effects, and  tolerance of medication regimen.  Therapeutic Interventions: 1 to 1 sessions, Unit Group sessions and Medication administration.  Evaluation of Outcomes: Adequate for Discharge  Physician Treatment Plan for Secondary Diagnosis: Principal Problem:   Severe recurrent major depression without psychotic features ([place]) Active Problems:   Diabetes mellitus without complication ([place])  Long Term Goal(s): Improvement in symptoms so as ready for discharge   Short Term Goals: Ability to identify changes in lifestyle to reduce recurrence of condition will improve Ability to verbalize feelings will improve Ability to disclose and discuss suicidal ideas Ability to demonstrate self-control will improve Ability to identify and develop effective coping behaviors will improve Ability to maintain clinical measurements within normal limits will improve Compliance with prescribed medications will improve Ability to identify triggers associated with substance abuse/mental health issues will improve     Medication Management: Evaluate patient's response, side effects, and tolerance of medication regimen.  Therapeutic Interventions: 1 to 1 sessions, Unit Group sessions and Medication administration.  Evaluation of Outcomes: Adequate for Discharge   RN Treatment Plan for Primary Diagnosis: Severe recurrent major depression without psychotic features ([place]) Long Term Goal(s): Knowledge of disease and therapeutic regimen to maintain health will improve  Short Term Goals: Ability to remain free from injury will improve, Ability to verbalize frustration and anger appropriately will improve, Ability to demonstrate self-control, Ability to participate in decision making will improve, Ability to verbalize feelings will improve, Ability to disclose and discuss suicidal ideas, Ability to identify and develop effective coping behaviors will improve, and Compliance with prescribed medications will improve  Medication  Management: RN will administer medications as ordered by provider, will assess and evaluate patient's response and provide education to patient for prescribed medication. RN will report any adverse and/or side effects to prescribing provider.  Therapeutic Interventions: 1 on 1 counseling sessions, Psychoeducation, Medication administration, Evaluate responses to treatment, Monitor vital signs and CBGs as ordered, Perform/monitor CIWA, COWS, AIMS and Fall Risk screenings as ordered, Perform wound care treatments as ordered.  Evaluation of Outcomes: Adequate for Discharge   LCSW Treatment Plan for Primary Diagnosis: Severe recurrent major depression without psychotic features ([place]) Long Term Goal(s): Safe transition to appropriate next level of care at discharge, Engage patient in therapeutic group addressing interpersonal concerns.  Short Term Goals: Engage patient in aftercare planning with referrals and resources, Increase social support, Increase ability to appropriately verbalize feelings, Increase emotional regulation, Facilitate acceptance of mental health diagnosis and concerns, Facilitate patient progression through stages of change regarding substance use diagnoses and concerns, and Increase skills for wellness and recovery  Therapeutic Interventions: Assess for all discharge needs, 1 to 1 time with Social worker, Explore available resources and support systems, Assess for adequacy in community support network, Educate family and significant other(s) on suicide prevention, Complete Psychosocial Assessment, Interpersonal group therapy.  Evaluation of Outcomes: Adequate for Discharge   Progress in Treatment: Attending groups: Yes. Participating in groups:  Yes. Taking medication as prescribed: Yes. Toleration medication: Yes. Family/Significant other contact made: Yes, individual(s) contacted:  pt's sister Patient understands diagnosis: Yes. Discussing patient identified problems/goals  with staff: Yes. Medical problems stabilized or resolved: Yes. Denies suicidal/homicidal ideation: Yes. Issues/concerns per patient self-inventory: No. Other: None  New problem(s) identified: No, Describe:  None  New Short Term/Long Term Goal(s):  elimination of symptoms of psychosis, medication management for mood stabilization; elimination of SI thoughts; development of comprehensive mental wellness plan. Update 06/26/2021: No changes at this time. Update 07/01/21: No changes at this time. Update 07/06/21: No changes at this time.  Patient Goals:   "To get on meds that I feel work so I can get back to self." Update 06/26/2021: No changes at this time. Update 07/01/21: No changes at this time. Update 07/06/21: No changes at this time.    Discharge Plan or Barriers: CSW will assist patient in development of appropriate discharge/aftercare plan. Update 06/26/2021: No changes at this time. Update 07/01/21: CSW was informed by pt's sisters that there was a mental health program in [place] that pt is interested in for follow up care. CSW has contacted facility, [facility], to find out acceptance into program, with possible housing. Update 07/06/21: CSW has sent referral for [facility] [place] and has contacted facility and has not been follwed up with regarding referral. Patient is able to go back home to his brothers.    Reason for Continuation of Hospitalization: Depression Medication stabilization   Estimated Length of Stay: TBD    Scribe for Treatment Team: Anya Murphey A [place], Latanya Presser 07/06/2021 4:08 PM

## 2021-07-07 NOTE — Group Note (Addendum)
[facility] LCSW Group Therapy Note   Group Date: 07/07/2021 Start Time: 1330 End Time: 1415   Type of Therapy/Topic:  Group Therapy:  Emotion Regulation  Participation Level:  Active     Description of Group:    The purpose of this group is to assist patients in learning to regulate negative emotions and experience positive emotions. Patients will be guided to discuss ways in which they have been vulnerable to their negative emotions. These vulnerabilities will be juxtaposed with experiences of positive emotions or situations, and patients challenged to use positive emotions to combat negative ones. Special emphasis will be placed on coping with negative emotions in conflict situations, and patients will process healthy conflict resolution skills.  Therapeutic Goals: Patient will identify two positive emotions or experiences to reflect on in order to balance out negative emotions:  Patient will label two or more emotions that they find the most difficult to experience:  Patient will be able to demonstrate positive conflict resolution skills through discussion or role plays:   Summary of Patient Progress:   Patient was present for the entirety of the session. Patient was an active listener and participated in the topic of discussion. He stated that he continues to feel anxiety about going home. Patient is more open to discussion his emotions and talking about his mental health needs and concerns.  He said that his coping strategies when he is tense and anxious is to smoke. He stated that he wants to limit his smoking because he eats less which reduces his physical activity. He states he wants to find implement more coping skills that he's learned here to facilitate a reduction in smoking use.      Therapeutic Modalities:   Cognitive Behavioral Therapy Feelings Identification Dialectical Behavioral Therapy   Carole Deere A [place], LCSWA

## 2021-07-07 NOTE — BHH Counselor (Signed)
CSW was contacted by Colletta Maryland with [facility]. She stated that they are not able to take on new patients currently and cannot accept pt's referral currently. She suggested CSW follow up with [facility] as an alternative for community services in the local area.   CSW will inform pt regarding [facility] referral.   Edgar White, MSW, LCSW-A 11/16/20223:55 PM

## 2021-07-07 NOTE — BHH Counselor (Signed)
CSW spoke with Marrianne Mood, pt's sister, and stated that [facility] is not accepting new pt's. CSW informed her that pt was interested in continuing services with [facility] with a CST component to care.   Torrey Horseman [place], MSW, LCSW-A 11/16/20224:03 PM

## 2021-07-07 NOTE — Progress Notes (Signed)
Pt standing in hallway talking to another patient; calm, relaxed. Pt states "I bet you haven't had anyone get upset like me." Pt's emotions validated and pt reassured that he will receive the emotional care and support of staff. Pt appeared less anxious and more relaxed. No acute distress noted.

## 2021-07-07 NOTE — Progress Notes (Signed)
[facility] MD Progress Note  07/07/2021 11:00 AM Edgar White  MRN:  [id number] Subjective: Edgar White continues to get better.  We increased his Effexor XR to 150 mg on Tuesday.  He is tolerating the medications.  He denies any side effects.  States he had some trouble sleeping last night but does not want to take trazodone because it makes him too tired the next day.  He says that he is able to go home to his brothers.  Plans on following up with [facility] or psychiatric Associates in [place].  He denies any suicidal ideation.  His affect is improved. Principal Problem: Severe recurrent major depression without psychotic features ([place]) Diagnosis: Principal Problem:   Severe recurrent major depression without psychotic features ([place]) Active Problems:   Diabetes mellitus without complication ([place])  Total Time spent with patient: 15 minutes  Past Psychiatric History: Extensive  Past Medical History:  Past Medical History:  Diagnosis Date   Depression    Diabetes mellitus without complication ([place])     Past Surgical History:  Procedure Laterality Date   INGUINAL HERNIA REPAIR     LUMBAR FUSION     Family History: History reviewed. No pertinent family history. Family Psychiatric  History: Unremarkable Social History: His parents passed away and he was living in their house until his brothers and sister sold the house and then he moved in with his brother which has been a struggle. Social History   Substance and Sexual Activity  Alcohol Use No   Comment: denies use     Social History   Substance and Sexual Activity  Drug Use Yes   Types: Marijuana, Cocaine    Social History   Socioeconomic History   Marital status: Single    Spouse name: Not on file   Number of children: Not on file   Years of education: Not on file   Highest education level: Not on file  Occupational History   Not on file  Tobacco Use   Smoking status: Every Day    Packs/day: 0.50    Types: Cigarettes    Smokeless tobacco: Never  Vaping Use   Vaping Use: Never used  Substance and Sexual Activity   Alcohol use: No    Comment: denies use   Drug use: Yes    Types: Marijuana, Cocaine   Sexual activity: Never  Other Topics Concern   Not on file  Social History Narrative   Not on file   Social Determinants of Health   Financial Resource Strain: Not on file  Food Insecurity: Not on file  Transportation Needs: Not on file  Physical Activity: Not on file  Stress: Not on file  Social Connections: Not on file   Additional Social History:                         Sleep: Fair  Appetite:  Fair  Current Medications: Current Facility-Administered Medications  Medication Dose Route Frequency Provider Last Rate Last Admin   acetaminophen (TYLENOL) tablet 650 mg  650 mg Oral Q6H PRN Clapacs, John T, MD   650 mg at 06/26/21 1112   alum & mag hydroxide-simeth (MAALOX/MYLANTA) 200-200-20 MG/5ML suspension 30 mL  30 mL Oral Q4H PRN Clapacs, John T, MD   30 mL at 06/25/21 2102   feeding supplement (ENSURE ENLIVE / ENSURE PLUS) liquid 237 mL  237 mL Oral TID BM Clapacs, John T, MD   237 mL at 07/06/21 2014   hydrOXYzine (  ATARAX/VISTARIL) tablet 10 mg  10 mg Oral BID PRN Clapacs, John T, MD   10 mg at 06/21/21 2100   hydrOXYzine (ATARAX/VISTARIL) tablet 50 mg  50 mg Oral TID PRN Clapacs, Madie Reno, MD   50 mg at 07/05/21 1223   loperamide (IMODIUM) capsule 2 mg  2 mg Oral Q4H PRN Salley Scarlet, MD       magnesium hydroxide (MILK OF MAGNESIA) suspension 30 mL  30 mL Oral Daily PRN Clapacs, John T, MD       metFORMIN (GLUCOPHAGE) tablet 500 mg  500 mg Oral BID WC Clapacs, John T, MD   500 mg at 07/07/21 0809   mirtazapine (REMERON) tablet 15 mg  15 mg Oral QHS Parks Ranger, DO   15 mg at 07/06/21 2118   multivitamin with minerals tablet 1 tablet  1 tablet Oral Daily Clapacs, Madie Reno, MD   1 tablet at 07/07/21 0959   ondansetron (ZOFRAN-ODT) disintegrating tablet 8 mg  8 mg Oral Q8H  PRN Salley Scarlet, MD   8 mg at 07/02/21 1200   traZODone (DESYREL) tablet 100 mg  100 mg Oral QHS PRN Clapacs, John T, MD   100 mg at 06/29/21 2113   venlafaxine XR (EFFEXOR-XR) 24 hr capsule 150 mg  150 mg Oral Q breakfast Parks Ranger, DO   150 mg at 07/07/21 9892    Lab Results: No results found for this or any previous visit (from the past 48 hour(s)).  Blood Alcohol level:  Lab Results  Component Value Date   ETH <10 06/17/2021   ETH <10 11/94/1740    Metabolic Disorder Labs: Lab Results  Component Value Date   HGBA1C 6.1 (H) 06/20/2021   MPG 128.37 06/20/2021   MPG 119.76 02/02/2018   No results found for: PROLACTIN Lab Results  Component Value Date   CHOL 235 (H) 02/02/2018   TRIG 146 02/02/2018   HDL 35 (L) 02/02/2018   CHOLHDL 6.7 02/02/2018   VLDL 29 02/02/2018   LDLCALC 171 (H) 02/02/2018   LDLCALC 153 (H) 11/05/2016    Physical Findings: AIMS:  , ,  ,  ,    CIWA:    COWS:     Musculoskeletal: Strength & Muscle Tone: within normal limits Gait & Station: normal Patient leans: N/A  Psychiatric Specialty Exam:  Presentation  General Appearance: Appropriate for Environment; Casual  Eye Contact:Fair  Speech:Slow  Speech Volume:Decreased  Handedness:Right   Mood and Affect  Mood:Depressed; Anxious  Affect:Restricted   Thought Process  Thought Processes:Coherent  Descriptions of Associations:Intact  Orientation:Full (Time, Place and Person)  Thought Content:Logical  History of Schizophrenia/Schizoaffective disorder:No  Duration of Psychotic Symptoms:No data recorded Hallucinations:No data recorded Ideas of Reference:None  Suicidal Thoughts:No data recorded Homicidal Thoughts:No data recorded  Sensorium  Memory:Immediate Fair; Remote Fair  Judgment:Fair  Insight:Fair   Executive Functions  Concentration:Fair  Attention Span:Fair  [place]   Psychomotor  Activity  Psychomotor Activity:No data recorded  Assets  Assets:Communication Skills; Desire for Improvement; Housing   Sleep  Sleep:No data recorded   Physical Exam: Physical Exam Constitutional:      Appearance: Normal appearance.  HENT:     Head: Normocephalic and atraumatic.     Mouth/Throat:     Pharynx: Oropharynx is clear.  Eyes:     Extraocular Movements: Extraocular movements intact.     Pupils: Pupils are equal, round, and reactive to light.  Cardiovascular:  Rate and Rhythm: Normal rate and regular rhythm.  Pulmonary:     Effort: Pulmonary effort is normal.     Breath sounds: Normal breath sounds.  Musculoskeletal:        General: Normal range of motion.     Cervical back: Normal range of motion and neck supple.  Skin:    General: Skin is warm and dry.  Neurological:     General: No focal deficit present.     Mental Status: He is alert and oriented to person, place, and time.  Psychiatric:        Mood and Affect: Mood normal.        Behavior: Behavior normal.        Thought Content: Thought content normal.        Judgment: Judgment normal.   Review of Systems  Constitutional: Negative.   HENT: Negative.    Eyes: Negative.   Respiratory: Negative.    Cardiovascular: Negative.   Gastrointestinal: Negative.   Genitourinary: Negative.   Musculoskeletal: Negative.   Skin: Negative.   Neurological: Negative.   Endo/Heme/Allergies: Negative.   Psychiatric/Behavioral:  Positive for depression. The patient is nervous/anxious.   Blood pressure 130/89, pulse 93, temperature 98.1 F (36.7 C), temperature source Oral, resp. rate 18, height 5\' 10"  (1.778 m), weight 68 kg, SpO2 98 %. Body mass index is 21.52 kg/m.   Treatment Plan Summary: Daily contact with patient to assess and evaluate symptoms and progress in treatment, Medication management, and Plan continue current medications.  Discharge on Friday.  [place], DO 07/07/2021, 11:00  AM

## 2021-07-07 NOTE — Progress Notes (Addendum)
Pt sitting in day room; tearful, sad. Pt states "Let me get myself together, then I'll go in my room" when asked if he would like to talk. Pt escorted to room and reports that he spoke with his sister who has spoken with the brother that he is supposed to live with after hospital discharge. Pt states that his brother wants him to go to therapy, get a job and get out. Pt believes that his brother expects him to get a job 2 weeks after he is discharged. Pt tearful and states "They want me to be better in 2 weeks. It's emotional torture." Pt states that his family does not understand that he will not get better overnight. He states that he sister told him "You have a college degree; you can get a job." Pt voiced concern about getting and losing his job if he is employed too soon. Pt encouraged to think realistically about what his life will be like after discharge. Family meeting recommended with social worker and psychiatrist but patient rejected idea, stating that he did not think that a meeting would be helpful. Pt offered and accepted anxiety medication. Pt made another phone call and confirmed that he is not going to call sister that he had spoken with earlier that upset him. Pt denies pain and SI/AVH at this time; however, he states that he feels like hurting someone. Pt states that he is sleeping well and describes his appetite as fair. Pt expresses feelings of anxiety and depression due to his family not understanding that he will not "get better overnight". Moderate anxiety noted.

## 2021-07-07 NOTE — Progress Notes (Signed)
Recreation Therapy Notes   Date: 07/07/2021  Time: 1:00 pm    Location: Courtyard    Behavioral response: N/A   Intervention Topic: Leisure   Discussion/Intervention: Patient did not attend group.   Clinical Observations/Feedback:  Patient did not attend group.   Cosimo Schertzer LRT/CTRS        Electa Sterry 07/07/2021 2:38 PM

## 2021-07-07 NOTE — Progress Notes (Signed)
Pt came to staff and was able to articulate thoughts and concerns r/t appointments after dch.  Pt displays future thought process voiced with plans after discharge.  Edgar White continues to show improvement daily.  He has been social with peers and staff throughout this shift.  He was up to eat breakfast and only went to room during the day to rest.  He was stayed up for the majority of this shift, he has been helpful towards his peers. Cont to monitor as ordered

## 2021-07-07 NOTE — BHH Counselor (Signed)
CSW contacted [facility] in [place], [phone] and spoke with Caryl Pina. She stated that Lars Masson, Act Team lead, was in a meeting and would call CSW back regarding update on referral, when available.   Kolbe Delmonaco [place], MSW, LCSW-A 11/16/202211:09 AM

## 2021-07-08 NOTE — Progress Notes (Signed)
Recreation Therapy Notes   Date: 07/08/2021  Time: 1:15 pm    Location: Day room    Behavioral response: Appropriate  Intervention Topic: Problem Solving   Discussion/Intervention:  Group content on today was focused on problem solving. The group described what problem solving is. Patients expressed how problems affect them and how they deal with problems. Individuals identified healthy ways to deal with problems. Patients explained what normally happens to them when they do not deal with problems. The group expressed reoccurring problems for them. The group participated in the intervention "Ways to Solve problems" where patients were given a chance to explore different ways to solve problems.  Clinical Observations/Feedback: Patient came to group late and was focused on what peers and staff had to say about problem solving. Individual was social with staff and peers while participating in the intervention. Chima Astorino LRT/CTRS          Chauntel Windsor 07/08/2021 3:23 PM

## 2021-07-08 NOTE — Progress Notes (Signed)
Patient is alert and oriented. Compliant during assessment. Report being anxious and depress, rate anxiety 6/10 and depression 3/10. Denies SI, HI, and AVH. Report that he slept well last night. Compliant with medications. Ate breakfast in the day room among staff and peers with good appetite and tolerated well. Remain safe in the unit with Q15 minute safety checks.

## 2021-07-08 NOTE — Progress Notes (Signed)
[facility] MD Progress Note  07/08/2021 10:25 AM Edgar White  MRN:  [id number] Subjective: Nursing told me that Edgar White had a difficult conversation with his sister on the phone last night and ended up in tears.  He plans on living with his brother.  His family wants him to continue to get treatment and he has agreed to follow-up with [facility] to receive therapy and medication management.  He likes the medication changes and feels that his mood is improved.  He is okay with leaving tomorrow.  He currently denies any suicidal ideation.  Principal Problem: Severe recurrent major depression without psychotic features ([place]) Diagnosis: Principal Problem:   Severe recurrent major depression without psychotic features ([place]) Active Problems:   Diabetes mellitus without complication ([place])  Total Time spent with patient: 15 minutes  Past Psychiatric History: Extensive  Past Medical History:  Past Medical History:  Diagnosis Date   Depression    Diabetes mellitus without complication ([place])     Past Surgical History:  Procedure Laterality Date   INGUINAL HERNIA REPAIR     LUMBAR FUSION     Family History: History reviewed. No pertinent family history. Family Psychiatric  History: Unremarkable Social History:  Social History   Substance and Sexual Activity  Alcohol Use No   Comment: denies use     Social History   Substance and Sexual Activity  Drug Use Yes   Types: Marijuana, Cocaine    Social History   Socioeconomic History   Marital status: Single    Spouse name: Not on file   Number of children: Not on file   Years of education: Not on file   Highest education level: Not on file  Occupational History   Not on file  Tobacco Use   Smoking status: Every Day    Packs/day: 0.50    Types: Cigarettes   Smokeless tobacco: Never  Vaping Use   Vaping Use: Never used  Substance and Sexual Activity   Alcohol use: No    Comment: denies use   Drug use: Yes    Types: Marijuana, Cocaine    Sexual activity: Never  Other Topics Concern   Not on file  Social History Narrative   Not on file   Social Determinants of Health   Financial Resource Strain: Not on file  Food Insecurity: Not on file  Transportation Needs: Not on file  Physical Activity: Not on file  Stress: Not on file  Social Connections: Not on file   Additional Social History:                         Sleep: Fair  Appetite:  Fair  Current Medications: Current Facility-Administered Medications  Medication Dose Route Frequency Provider Last Rate Last Admin   acetaminophen (TYLENOL) tablet 650 mg  650 mg Oral Q6H PRN Clapacs, John T, MD   650 mg at 06/26/21 1112   alum & mag hydroxide-simeth (MAALOX/MYLANTA) 200-200-20 MG/5ML suspension 30 mL  30 mL Oral Q4H PRN Clapacs, John T, MD   30 mL at 06/25/21 2102   feeding supplement (ENSURE ENLIVE / ENSURE PLUS) liquid 237 mL  237 mL Oral TID BM Clapacs, John T, MD   237 mL at 07/08/21 0925   hydrOXYzine (ATARAX/VISTARIL) tablet 10 mg  10 mg Oral BID PRN Clapacs, Madie Reno, MD   10 mg at 06/21/21 2100   hydrOXYzine (ATARAX/VISTARIL) tablet 50 mg  50 mg Oral TID PRN Clapacs, Madie Reno, MD  50 mg at 07/07/21 1942   loperamide (IMODIUM) capsule 2 mg  2 mg Oral Q4H PRN Salley Scarlet, MD       magnesium hydroxide (MILK OF MAGNESIA) suspension 30 mL  30 mL Oral Daily PRN Clapacs, John T, MD       metFORMIN (GLUCOPHAGE) tablet 500 mg  500 mg Oral BID WC Clapacs, John T, MD   500 mg at 07/08/21 0801   mirtazapine (REMERON) tablet 15 mg  15 mg Oral QHS Parks Ranger, DO   15 mg at 07/07/21 2147   multivitamin with minerals tablet 1 tablet  1 tablet Oral Daily Clapacs, Madie Reno, MD   1 tablet at 07/08/21 0925   ondansetron (ZOFRAN-ODT) disintegrating tablet 8 mg  8 mg Oral Q8H PRN Salley Scarlet, MD   8 mg at 07/02/21 1200   traZODone (DESYREL) tablet 100 mg  100 mg Oral QHS PRN Clapacs, John T, MD   100 mg at 06/29/21 2113   venlafaxine XR (EFFEXOR-XR) 24  hr capsule 150 mg  150 mg Oral Q breakfast Parks Ranger, DO   150 mg at 07/08/21 4166    Lab Results: No results found for this or any previous visit (from the past 48 hour(s)).  Blood Alcohol level:  Lab Results  Component Value Date   ETH <10 06/17/2021   ETH <10 02/19/1600    Metabolic Disorder Labs: Lab Results  Component Value Date   HGBA1C 6.1 (H) 06/20/2021   MPG 128.37 06/20/2021   MPG 119.76 02/02/2018   No results found for: PROLACTIN Lab Results  Component Value Date   CHOL 235 (H) 02/02/2018   TRIG 146 02/02/2018   HDL 35 (L) 02/02/2018   CHOLHDL 6.7 02/02/2018   VLDL 29 02/02/2018   LDLCALC 171 (H) 02/02/2018   LDLCALC 153 (H) 11/05/2016    Physical Findings: AIMS:  , ,  ,  ,    CIWA:    COWS:     Musculoskeletal: Strength & Muscle Tone: within normal limits Gait & Station: normal Patient leans: N/A  Psychiatric Specialty Exam:  Presentation  General Appearance: Appropriate for Environment; Casual  Eye Contact:Fair  Speech:Slow  Speech Volume:Decreased  Handedness:Right   Mood and Affect  Mood:Depressed; Anxious  Affect:Restricted   Thought Process  Thought Processes:Coherent  Descriptions of Associations:Intact  Orientation:Full (Time, Place and Person)  Thought Content:Logical  History of Schizophrenia/Schizoaffective disorder:No  Duration of Psychotic Symptoms:No data recorded Hallucinations:No data recorded Ideas of Reference:None  Suicidal Thoughts:No data recorded Homicidal Thoughts:No data recorded  Sensorium  Memory:Immediate Fair; Remote Fair  Judgment:Fair  Insight:Fair   Executive Functions  Concentration:Fair  Attention Span:Fair  [place]   Psychomotor Activity  Psychomotor Activity:No data recorded  Assets  Assets:Communication Skills; Desire for Improvement; Housing   Sleep  Sleep:No data recorded   Physical Exam: Physical  Exam Vitals and nursing note reviewed.  Constitutional:      Appearance: Normal appearance.  HENT:     Head: Normocephalic and atraumatic.     Nose: Nose normal.     Mouth/Throat:     Pharynx: Oropharynx is clear.  Eyes:     Extraocular Movements: Extraocular movements intact.     Pupils: Pupils are equal, round, and reactive to light.  Cardiovascular:     Rate and Rhythm: Normal rate and regular rhythm.  Pulmonary:     Effort: Pulmonary effort is normal.     Breath sounds: Normal breath  sounds.  Abdominal:     General: Bowel sounds are normal.     Palpations: Abdomen is soft.  Musculoskeletal:        General: Normal range of motion.     Cervical back: Normal range of motion and neck supple.  Skin:    General: Skin is warm and dry.  Neurological:     General: No focal deficit present.     Mental Status: He is alert and oriented to person, place, and time.  Psychiatric:        Mood and Affect: Mood is anxious and depressed. Affect is labile.        Speech: Speech normal.        Behavior: Behavior is cooperative.        Thought Content: Thought content normal.        Cognition and Memory: Cognition and memory normal.        Judgment: Judgment normal.   Review of Systems  Constitutional: Negative.   HENT: Negative.    Eyes: Negative.   Respiratory: Negative.    Cardiovascular: Negative.   Gastrointestinal: Negative.   Genitourinary: Negative.   Musculoskeletal: Negative.   Skin: Negative.   Neurological: Negative.   Endo/Heme/Allergies: Negative.   Psychiatric/Behavioral:  Positive for depression. The patient is nervous/anxious.   Blood pressure 137/84, pulse 96, temperature (!) 97.3 F (36.3 C), temperature source Oral, resp. rate 20, height 5\' 10"  (1.778 m), weight 68 kg, SpO2 100 %. Body mass index is 21.52 kg/m.   Treatment Plan Summary: Daily contact with patient to assess and evaluate symptoms and progress in treatment, Medication management, and Plan  continue current medications.  Discharge tomorrow follow-up at [facility] with Lanae Boast.  Parks Ranger, DO 07/08/2021, 10:25 AM

## 2021-07-08 NOTE — Progress Notes (Signed)
Patient refuse lunch, complain of nausea PRN Ondansetron given. Patient is currently in his room laying in bed in no apparent distress. Will continue to monitor. Remain safe in the unit with q15 minute safety check.

## 2021-07-08 NOTE — BHH Counselor (Signed)
CSW sent referral to [facility], [phone] for pt.  CSW contacted facility and received confirmation of receipt of referral.   Edgar White, MSW, LCSW-A 11/17/20224:04 PM

## 2021-07-09 MED ORDER — MIRTAZAPINE 15 MG PO TABS: 15.0000 mg | ORAL_TABLET | Freq: Every day | ORAL | 1 refills | Status: DC

## 2021-07-09 MED ORDER — VENLAFAXINE HCL ER 150 MG PO CP24: 150.0000 mg | ORAL_CAPSULE | Freq: Every day | ORAL | 1 refills | Status: DC

## 2021-07-09 MED ORDER — METFORMIN HCL 500 MG PO TABS
500.0000 mg | ORAL_TABLET | Freq: Two times a day (BID) | ORAL | 1 refills | Status: DC
Start: 2021-07-09 — End: 2022-06-22

## 2021-07-09 MED ORDER — TRAZODONE HCL 100 MG PO TABS: 100.0000 mg | ORAL_TABLET | Freq: Every evening | ORAL | 1 refills | Status: DC | PRN

## 2021-07-09 NOTE — Plan of Care (Signed)
  Problem: Group Participation Goal: STG - Patient will engage in groups without prompting or encouragement from LRT x3 group sessions within 5 recreation therapy group sessions Description: STG - Patient will engage in groups without prompting or encouragement from LRT x3 group sessions within 5 recreation therapy group sessions Outcome: Completed/Met

## 2021-07-09 NOTE — Progress Notes (Signed)
Pt lying awake in bed; calm, cooperative. Pt states "I'm okay". Pt denies pain and SI/HI/AVH at this time. Pt reports that he did not get much sleep last night because he kept waking up. Pt describes his appetite as "non-existent" because he has not been able to eat due to being depressed and ill-fitting dentures. Pt expresses feelings of anxiety and depression due to pending discharge tomorrow and not having support of his family. He states that he is not able to stay with his brother but he has been given information on local boarder houses by the Education officer, museum. Pt expressed his feelings about his siblings convincing his mother to change her will before she passed away, his increased anxiety around his sister Santiago Glad), finding a place to stay after discharge and the "unrealistic" expectations of his siblings for him to find a job, attend therapy and "get better" in 2 weeks. No acute distress noted.

## 2021-07-09 NOTE — Progress Notes (Signed)
Recreation Therapy Notes   Date: 07/09/2021  Time: 1:00pm    Location:  Craft room    Behavioral response: N/A   Intervention Topic: Decision making    Discussion/Intervention: Patient did not attend group.   Clinical Observations/Feedback:  Patient did not attend group.   Aniket Paye LRT/CTRS          Charma Mocarski 07/09/2021 2:49 PM

## 2021-07-09 NOTE — Group Note (Addendum)
LCSW Group Therapy Note  Group Date: 07/09/2021 Start Time: 1400 End Time: 1430   Type of Therapy and Topic:  Group Therapy - How To Cope with Nervousness about Discharge   Participation Level:  Active   Description of Group This process group involved identification of patients' feelings about discharge. Some of them are scheduled to be discharged soon, while others are new admissions, but each of them was asked to share thoughts and feelings surrounding discharge from the hospital. One common theme was that they are excited at the prospect of going home, while another was that many of them are apprehensive about sharing why they were hospitalized. Patients were given the opportunity to discuss these feelings with their peers in preparation for discharge.  Therapeutic Goals  Patient will identify their overall feelings about pending discharge. Patient will think about how they might proactively address issues that they believe will once again arise once they get home (i.e. with parents). Patients will participate in discussion about having hope for change.   Summary of Patient Progress:  Patient was very active throughout the session. He demonstrated good insight into the subject matter, and proved open to input from peers and feedback from [place]. He was respectful of peers and participated throughout the entire session. He said he felt "scared" and worried regarding his discharge. CSW discussed healthy and unhealthy coping skills and pt said smoking cigarettes was something he was looking forward to. He said he would make a call to his friend when he felt worried going forward. CSW facilitated a 5 minute meditation to facilitate self-care strategies.    Therapeutic Modalities Cognitive Behavioral Therapy   Angeni Chaudhuri A [place], LCSWA 07/09/2021  3:15 PM

## 2021-07-09 NOTE — Progress Notes (Signed)
  [facility] Adult Case Management Discharge Plan :  Will you be returning to the same living situation after discharge:  No. At discharge , do you have transportation home?: Yes,  pt has own transportation Do you have the ability to pay for your medications: Yes,  [employer] Medicaid  Release of information consent forms completed and in the chart;  Patient's signature needed at discharge.  Patient to Follow up at:  Follow-up Information     [facility] on 07/12/2021.   Why: You have a follow up appointment scheduled for Monday November 21st at 9:30pm. (in-person) Please request CST Marine scientist) services for peer support at [facility] Follow up. Thanks! Contact information: [place] [address] [phone]         [facility] Follow up.   Why: Referral for services has been sent. Please contact to schedule appointment if interested in starting services for [facility].  Please use this address and phone number to contact the [place] office. Thanks!  [address]  [place], [place]  [address] Phone: [phone]  Fax: [phone] Crisis #: [phone] Contact information: [place] [address] [phone]                 Next level of care provider has access to [place] and Suicide Prevention discussed: Yes,  completed with pt's family member     Has patient been referred to the Quitline?: Patient refused referral  Patient has been referred for addiction treatment: Pt. refused referral  Reghan Thul A [place], [place] 07/09/2021, 10:17 AM

## 2021-07-09 NOTE — Progress Notes (Signed)
Pt sitting in day room while sister Santiago Glad) visiting. Pt and his sister tearful during visit. Pt appeared moderately anxious. Pt's sister had already visited for 30 minutes, so she left. Pt declined anxiety medication. Pt stated that his sister wanted him to contact people with whom he does not want to communicate with. Pt stated that his sister makes his anxiety worse when she visits. Pt moderately anxious at this time.

## 2021-07-09 NOTE — Discharge Summary (Signed)
Physician Discharge Summary Note  Patient:  Edgar White is an 55 y.o., male MRN:  [id number] DOB:  Jul 08, 1966 Patient phone:  [phone] (home)  Patient address:   [place] [address],  Total Time spent with patient: 30 minutes  Date of Admission:  06/18/2021 Date of Discharge: 07/09/2021  Reason for Admission: " Im depressed, I cannot work, Im on disability , Im financially poor" Chart reviewed, pt seen. Edgar White is a 55 y.o. male patient admitted with suicidal thoughts, worsening depression.  Per Psych consult note-  "I saw my doctor today and he told me to come here." Edgar White is a 55 y.o. male patient admitted with suicidal thoughts, worsening depression. Patient is very tearful.  He states that he has had a long history of depression with several hospitalizations and ECT, which he feels did not work very well.  Patient describes that it has gotten particularly worse over the last year when he had all his teeth pulled and got dentures.  He said since that time he does not want to talk to people and the dentures making him feel self-conscious.  He does not want to eat.  He does not like to cook but he finds no enjoyment in that anymore.  He states he has lost a lot of weight.  He has had several losses in the last few years including his mother, father, and his dog.  He states that he is currently getting care at [facility] and is on Zyprexa and Pristiq.  He feels that he needs medication adjustment.  He is on disability due to his depression.  He does not feel that he has any joy in his life but is hopeful that he will get better.  Patient denies alcohol use.  States that he smoked marijuana several weeks ago but that did not help.  Denies other illicit drugs. Patient meets criteria for inpatient psychiatric admission and desires this. Pt seen today in his room, pt endorses depression for about 5 yrs, after death of his parents, reports depression worsening  for last several months, pt endorses hopelessness,, helplessness and intermittent SI, no plan, contracts for safety. Pt reports poor concentration, poor sleep, appetite leading 30 lb wt loss over last few yrs. Reports multiple stressors ,  "I cannot work, Im on disability , Im financially poor", pt lives with his brother, has no kids. We discussed meds, reports pristiq was working but not working anymore, Effexor was tried in the past and didn't work, not wanting to try again, pt willing to try prozac and remeron.  Associated Signs/Symptoms:see above, denies hallucination, denies manic /hypomanic symptoms Depression Symptoms:  yes, see above Duration of Depression Symptoms: Greater than two weeks  Principal Problem: Severe recurrent major depression without psychotic features [facility]) Discharge Diagnoses: Principal Problem:   Severe recurrent major depression without psychotic features ([place]) Active Problems:   Diabetes mellitus without complication ([place])   Past Psychiatric History: Extensive  Past Medical History:  Past Medical History:  Diagnosis Date   Depression    Diabetes mellitus without complication ([place])     Past Surgical History:  Procedure Laterality Date   INGUINAL HERNIA REPAIR     LUMBAR FUSION     Family History: History reviewed. No pertinent family history. Family Psychiatric  History: Unremarkable Social History:  Social History   Substance and Sexual Activity  Alcohol Use No   Comment: denies use     Social History   Substance and  Sexual Activity  Drug Use Yes   Types: Marijuana, Cocaine    Social History   Socioeconomic History   Marital status: Single    Spouse name: Not on file   Number of children: Not on file   Years of education: Not on file   Highest education level: Not on file  Occupational History   Not on file  Tobacco Use   Smoking status: Every Day    Packs/day: 0.50    Types: Cigarettes   Smokeless tobacco: Never  Vaping Use    Vaping Use: Never used  Substance and Sexual Activity   Alcohol use: No    Comment: denies use   Drug use: Yes    Types: Marijuana, Cocaine   Sexual activity: Never  Other Topics Concern   Not on file  Social History Narrative   Not on file   Social Determinants of Health   Financial Resource Strain: Not on file  Food Insecurity: Not on file  Transportation Needs: Not on file  Physical Activity: Not on file  Stress: Not on file  Social Connections: Not on file    Hospital Course: Edgar White was admitted to the general psychiatry unit.  He was followed by Dr. Weber Cooks initially until I took over his service a few days later.  We changed his Zoloft to Effexor XR because he did well on that in the past.  He responded well to the change.  He was also started on Remeron at bedtime for sleep and depression and the Seroquel was discontinued.  He participated well on the unit and was pleasant and cooperative.  On the day of discharge she denied any suicidal ideation or homicidal ideation or auditory or visual hallucinations.  His judgment and insight were good.  Physical Findings: AIMS:  , ,  ,  ,    CIWA:    COWS:     Musculoskeletal: Strength & Muscle Tone: within normal limits Gait & Station: normal Patient leans: N/A   Psychiatric Specialty Exam:  Presentation  General Appearance: Appropriate for Environment; Casual  Eye Contact:Fair  Speech:Slow  Speech Volume:Decreased  Handedness:Right   Mood and Affect  Mood:Depressed; Anxious  Affect:Restricted   Thought Process  Thought Processes:Coherent  Descriptions of Associations:Intact  Orientation:Full (Time, Place and Person)  Thought Content:Logical  History of Schizophrenia/Schizoaffective disorder:No  Duration of Psychotic Symptoms:No data recorded Hallucinations:No data recorded Ideas of Reference:None  Suicidal Thoughts:No data recorded Homicidal Thoughts:No data recorded  Sensorium  Memory:Immediate  Fair; Remote Fair  Judgment:Fair  Insight:Fair   Executive Functions  Concentration:Fair  Attention Span:Fair  [place]   Psychomotor Activity  Psychomotor Activity:No data recorded  Assets  Assets:Communication Skills; Desire for Improvement; Housing   Sleep  Sleep:No data recorded   Physical Exam: Physical Exam Vitals and nursing note reviewed.  Constitutional:      Appearance: Normal appearance.  HENT:     Head: Normocephalic and atraumatic.     Mouth/Throat:     Pharynx: Oropharynx is clear.  Eyes:     Extraocular Movements: Extraocular movements intact.     Pupils: Pupils are equal, round, and reactive to light.  Cardiovascular:     Rate and Rhythm: Normal rate and regular rhythm.  Pulmonary:     Effort: Pulmonary effort is normal.     Breath sounds: Normal breath sounds.  Musculoskeletal:        General: Normal range of motion.     Cervical back: Normal range  of motion.  Skin:    General: Skin is warm and dry.  Neurological:     General: No focal deficit present.     Mental Status: He is alert and oriented to person, place, and time.  Psychiatric:        Mood and Affect: Mood normal.        Behavior: Behavior normal.   Review of Systems  Constitutional: Negative.   HENT: Negative.    Eyes: Negative.   Respiratory: Negative.    Cardiovascular: Negative.   Gastrointestinal: Negative.   Genitourinary: Negative.   Musculoskeletal: Negative.   Skin: Negative.   Neurological: Negative.   Endo/Heme/Allergies: Negative.   Psychiatric/Behavioral: Negative.    Blood pressure 126/78, pulse 73, temperature 97.6 F (36.4 C), temperature source Oral, resp. rate 18, height 5\' 10"  (1.778 m), weight 68 kg, SpO2 100 %. Body mass index is 21.52 kg/m.   Social History   Tobacco Use  Smoking Status Every Day   Packs/day: 0.50   Types: Cigarettes  Smokeless Tobacco Never   Tobacco Cessation:  N/A, patient  does not currently use tobacco products   Blood Alcohol level:  Lab Results  Component Value Date   ETH <10 06/17/2021   ETH <10 29/47/6546    Metabolic Disorder Labs:  Lab Results  Component Value Date   HGBA1C 6.1 (H) 06/20/2021   MPG 128.37 06/20/2021   MPG 119.76 02/02/2018   No results found for: PROLACTIN Lab Results  Component Value Date   CHOL 235 (H) 02/02/2018   TRIG 146 02/02/2018   HDL 35 (L) 02/02/2018   CHOLHDL 6.7 02/02/2018   VLDL 29 02/02/2018   LDLCALC 171 (H) 02/02/2018   LDLCALC 153 (H) 11/05/2016    See Psychiatric Specialty Exam and Suicide Risk Assessment completed by Attending Physician prior to discharge.  Discharge destination:  Home  Is patient on multiple antipsychotic therapies at discharge:  No   Has Patient had three or more failed trials of antipsychotic monotherapy by history:  No  Recommended Plan for Multiple Antipsychotic Therapies: NA   Allergies as of 07/09/2021   No Known Allergies      Medication List     STOP taking these medications    desvenlafaxine 50 MG 24 hr tablet Commonly known as: PRISTIQ   hydrOXYzine 10 MG tablet Commonly known as: ATARAX/VISTARIL   QUEtiapine 100 MG tablet Commonly known as: SEROQUEL   sertraline 100 MG tablet Commonly known as: ZOLOFT       TAKE these medications      Indication  metFORMIN 500 MG tablet Commonly known as: GLUCOPHAGE Take 1 tablet (500 mg total) by mouth 2 (two) times daily with a meal.    mirtazapine 15 MG tablet Commonly known as: REMERON Take 1 tablet (15 mg total) by mouth at bedtime.    traZODone 100 MG tablet Commonly known as: DESYREL Take 1 tablet (100 mg total) by mouth at bedtime as needed for sleep (Insomnia).    venlafaxine XR 150 MG 24 hr capsule Commonly known as: EFFEXOR-XR Take 1 capsule (150 mg total) by mouth daily with breakfast. Start taking on: July 10, 2021  Indication: Major Depressive Disorder        Follow-up  Information     [place] on 07/12/2021.   Why: You have a follow up appointment scheduled for Monday November 21st at 9:30pm. (in-person) Please request CST [employer] scientist) services for peer support at [facility] Follow up. Thanks! Contact  information: [place] [address] [phone]         [facility] Follow up.   Why: Referral for services has been sent. Please contact to schedule appointment if interested in starting services for ACTT.  Please use this address and phone number to contact the [place] office. Thanks!  [address]  [place], [place]  [address] Phone: [phone]  Fax: [phone] Crisis #: [phone] Contact information: [place] [address] [phone]                 Follow-up recommendations:  Other:  Continue  his discharge medications and follow-up at [facility] in [place]  Comments: None  Signed: Parks Ranger, DO 07/09/2021, 11:09 AM

## 2021-07-09 NOTE — Plan of Care (Signed)
Patient Calm but Anxious on unit during shift thus far. Patient denies SI/HI/AVH and contracts for safety.  Patient did have minimal participation in therapeutic milieu. Patient did verbalize importance of compliance with treatment plan.  Patient complaint with scheduled medications and denies any adverse reactions. Pt informed to notify nursing staff if needs arise. Problem: Education: Goal: Knowledge of [place] [facility] Education information/materials will improve Outcome: Progressing Goal: Emotional status will improve Outcome: Progressing Goal: Mental status will improve Outcome: Progressing   Problem: Activity: Goal: Interest or engagement in activities will improve Outcome: Progressing   Problem: Health Behavior/Discharge Planning: Goal: Identification of resources available to assist in meeting health care needs will improve Outcome: Progressing   Q 15 minute safety rounds in place. Plan of care will continue.

## 2021-07-09 NOTE — Progress Notes (Signed)
Recreation Therapy Notes  INPATIENT RECREATION TR PLAN  Patient Details Name: Edgar White MRN: [id number] DOB: Mar 14, 1966 Today's Date: 07/09/2021  Rec Therapy Plan Is patient appropriate for Therapeutic Recreation?: Yes Treatment times per week: at least 3 Estimated Length of Stay: 5-7 days TR Treatment/Interventions: Group participation (Comment)  Discharge Criteria Pt will be discharged from therapy if:: Discharged Treatment plan/goals/alternatives discussed and agreed upon by:: Patient/family  Discharge Summary Short term goals set: Patient will engage in groups without prompting or encouragement from LRT x3 group sessions within 5 recreation therapy group sessions Short term goals met: Complete Progress toward goals comments: Groups attended Which groups?: Wellness, Social skills, Goal setting, Other (Comment) (Creative expressions, Problem Solving) Reason goals not met: N/A Therapeutic equipment acquired: N/A Reason patient discharged from therapy: Discharge from hospital Pt/family agrees with progress & goals achieved: Yes Date patient discharged from therapy: 07/09/21   Lateasha Breuer 07/09/2021, 2:58 PM

## 2021-07-09 NOTE — BHH Suicide Risk Assessment (Signed)
[facility] Discharge Suicide Risk Assessment   Principal Problem: Severe recurrent major depression without psychotic features [facility]) Discharge Diagnoses: Principal Problem:   Severe recurrent major depression without psychotic features ([place]) Active Problems:   Diabetes mellitus without complication ([place])   Total Time spent with patient: 15 minutes  Musculoskeletal: Strength & Muscle Tone: within normal limits Gait & Station: normal Patient leans: N/A  Psychiatric Specialty Exam  Presentation  General Appearance: Appropriate for Environment; Casual  Eye Contact:Fair  Speech:Slow  Speech Volume:Decreased  Handedness:Right   Mood and Affect  Mood:Depressed; Anxious  Duration of Depression Symptoms: Greater than two weeks  Affect:Restricted   Thought Process  Thought Processes:Coherent  Descriptions of Associations:Intact  Orientation:Full (Time, Place and Person)  Thought Content:Logical  History of Schizophrenia/Schizoaffective disorder:No  Duration of Psychotic Symptoms:No data recorded Hallucinations:No data recorded Ideas of Reference:None  Suicidal Thoughts:No data recorded none Homicidal Thoughts:No data recorded none  Sensorium  Memory:Immediate Fair; Remote Fair  Judgment:Fair  Insight:Fair   Executive Functions  Concentration:Fair  Attention Span:Fair  [place]   Psychomotor Activity  Psychomotor Activity:No data recorded  Assets  Assets:Communication Skills; Desire for Improvement; Housing   Sleep  Sleep:No data recorded  Physical Exam: Physical Exam Vitals and nursing note reviewed.  Constitutional:      Appearance: Normal appearance.  HENT:     Head: Normocephalic and atraumatic.     Mouth/Throat:     Pharynx: Oropharynx is clear.  Eyes:     Extraocular Movements: Extraocular movements intact.     Pupils: Pupils are equal, round, and reactive to light.  Cardiovascular:      Rate and Rhythm: Normal rate and regular rhythm.     Pulses: Normal pulses.     Heart sounds: Normal heart sounds.  Pulmonary:     Effort: Pulmonary effort is normal.     Breath sounds: Normal breath sounds.  Abdominal:     General: Bowel sounds are normal.     Palpations: Abdomen is soft.  Musculoskeletal:        General: Normal range of motion.     Cervical back: Normal range of motion.  Skin:    General: Skin is warm and dry.  Neurological:     General: No focal deficit present.     Mental Status: He is alert and oriented to person, place, and time.  Psychiatric:        Attention and Perception: Attention and perception normal.        Mood and Affect: Mood and affect normal.        Speech: Speech normal.        Behavior: Behavior normal. Behavior is cooperative.        Thought Content: Thought content normal.        Cognition and Memory: Cognition and memory normal.        Judgment: Judgment normal.   Review of Systems  Constitutional: Negative.   HENT: Negative.    Eyes: Negative.   Respiratory: Negative.    Cardiovascular: Negative.   Gastrointestinal: Negative.   Genitourinary: Negative.   Musculoskeletal: Negative.   Skin: Negative.   Neurological: Negative.   Endo/Heme/Allergies: Negative.   Psychiatric/Behavioral: Negative.    All other systems reviewed and are negative. Blood pressure 126/78, pulse 73, temperature 97.6 F (36.4 C), temperature source Oral, resp. rate 18, height 5\' 10"  (1.778 m), weight 68 kg, SpO2 100 %. Body mass index is 21.52 kg/m.  Mental Status Per Nursing Assessment::  On Admission:  Suicidal ideation indicated by patient (denies at present)  Demographic Factors:  Male and Unemployed  Loss Factors: NA  Historical Factors: NA  Risk Reduction Factors:   Living with another person, especially a relative and Positive social support  Continued Clinical Symptoms:  Depression:   Anhedonia  Cognitive Features That Contribute To  Risk:  None    Suicide Risk:  Minimal: No identifiable suicidal ideation.  Patients presenting with no risk factors but with morbid ruminations; may be classified as minimal risk based on the severity of the depressive symptoms   Follow-up Information     [place] on 07/12/2021.   Why: You have a follow up appointment scheduled for Monday November 21st at 9:30pm. (in-person) Please request [employer] scientist) services for peer support at [facility] Follow up. Thanks! Contact information: [place] [address] [phone]         [facility] Follow up.   Why: Referral for services has been sent. Please contact to schedule appointment if interested in starting services for ACTT.  Please use this address and phone number to contact the [place] office. Thanks!  [address]  [place], [place]  [address] Phone: [phone]  Fax: [phone] Crisis #: [phone] Contact information: [place] [address] [phone]                 Plan Of Care/Follow-up recommendations: Follow-up at [facility] in [place] Activity:  As tolerated Diet:  Regular Tests:  Per out-patient  Parks Ranger, DO 07/09/2021, 10:49 AM

## 2021-07-09 NOTE — Progress Notes (Signed)
Discharge Note   Patient left unit in stable condition with all belongings. Writer reviewed AVS with patient. Patient verbalized understanding of information given. AVS given to patient. Patient denies SI/HI/AVH and contracts for safety.  Patient escorted to lobby where he will home safely.

## 2022-06-21 ENCOUNTER — Encounter: Payer: Self-pay | Admitting: Dermatology

## 2022-06-22 ENCOUNTER — Ambulatory Visit (INDEPENDENT_AMBULATORY_CARE_PROVIDER_SITE_OTHER): Payer: Medicaid Other | Admitting: Dermatology

## 2022-06-22 DIAGNOSIS — C44619 Basal cell carcinoma of skin of left upper limb, including shoulder: Secondary | ICD-10-CM

## 2022-06-22 DIAGNOSIS — C44519 Basal cell carcinoma of skin of other part of trunk: Secondary | ICD-10-CM

## 2022-06-22 DIAGNOSIS — C4491 Basal cell carcinoma of skin, unspecified: Secondary | ICD-10-CM

## 2022-06-22 DIAGNOSIS — L578 Other skin changes due to chronic exposure to nonionizing radiation: Secondary | ICD-10-CM

## 2022-06-22 DIAGNOSIS — I781 Nevus, non-neoplastic: Secondary | ICD-10-CM | POA: Diagnosis not present

## 2022-06-22 DIAGNOSIS — L821 Other seborrheic keratosis: Secondary | ICD-10-CM

## 2022-06-22 DIAGNOSIS — C44319 Basal cell carcinoma of skin of other parts of face: Secondary | ICD-10-CM | POA: Diagnosis not present

## 2022-06-22 DIAGNOSIS — L708 Other acne: Secondary | ICD-10-CM

## 2022-06-22 DIAGNOSIS — D485 Neoplasm of uncertain behavior of skin: Secondary | ICD-10-CM

## 2022-06-22 HISTORY — DX: Basal cell carcinoma of skin, unspecified: C44.91

## 2022-06-22 NOTE — Progress Notes (Signed)
New Patient Visit  Subjective  Edgar White is a 56 y.o. male who presents for the following: New Patient (Initial Visit) (Patient here today growth on back has been growing for 2 years. Another spot at back below that he noticed 6 months ago. Spot at nose 2018 raised right side of nose. Dark spot at right abdomen. ).   The following portions of the chart were reviewed this encounter and updated as appropriate:       Review of Systems:  No other skin or systemic complaints except as noted in HPI or Assessment and Plan.  Objective  Well appearing patient in no apparent distress; mood and affect are within normal limits.  A focused examination was performed including face, back, chest, abdomen. Relevant physical exam findings are noted in the Assessment and Plan.  L spinal upper back 2.0 cm pink pearly nodule     L spinal mid back 1.0 cm pink pearly papule       L medial post shoulder 1.3 cm pink depressed pearly plaque     Right Upper Back 2.0 x 1.2 cm pink pearly patch     R nasal ala 7.0 mm pearly pink white flat papule       Right Abdomen 4.0 mm waxy dark brown papule  nasal dorsum Blanching pink macule    Assessment & Plan  Neoplasm of uncertain behavior of skin (5) L spinal upper back  Skin / nail biopsy Type of biopsy: tangential   Informed consent: discussed and consent obtained   Patient was prepped and draped in usual sterile fashion: Area prepped with alcohol. Anesthesia: the lesion was anesthetized in a standard fashion   Anesthetic:  1% lidocaine w/ epinephrine 1-100,000 buffered w/ 8.4% NaHCO3 Instrument used: flexible razor blade   Hemostasis achieved with: pressure, aluminum chloride and electrodesiccation   Outcome: patient tolerated procedure well    Destruction of lesion  Destruction method: electrodesiccation and curettage   Informed consent: discussed and consent obtained   Curettage performed in three different  directions: Yes   Electrodesiccation performed over the curetted area: Yes   Final wound size (cm):  2.4 Hemostasis achieved with:  pressure, aluminum chloride and electrodesiccation Outcome: patient tolerated procedure well with no complications   Post-procedure details: wound care instructions given   Post-procedure details comment:  Ointment and bandage applied.  Specimen 1 - Surgical pathology Differential Diagnosis: r/o BCC  Check Margins: No EDC today  L spinal mid back  Skin / nail biopsy Type of biopsy: tangential   Informed consent: discussed and consent obtained   Patient was prepped and draped in usual sterile fashion: Area prepped with alcohol. Anesthesia: the lesion was anesthetized in a standard fashion   Anesthetic:  1% lidocaine w/ epinephrine 1-100,000 buffered w/ 8.4% NaHCO3 Instrument used: flexible razor blade   Hemostasis achieved with: pressure, aluminum chloride and electrodesiccation   Outcome: patient tolerated procedure well    Destruction of lesion  Destruction method: electrodesiccation and curettage   Informed consent: discussed and consent obtained   Curettage performed in three different directions: Yes   Electrodesiccation performed over the curetted area: Yes   Final wound size (cm):  1.5 Hemostasis achieved with:  pressure, aluminum chloride and electrodesiccation Outcome: patient tolerated procedure well with no complications   Post-procedure details: wound care instructions given   Post-procedure details comment:  Ointment and bandage applied.  Specimen 2 - Surgical pathology Differential Diagnosis: r/o BCC  Check Margins: No EDC today  L medial post shoulder  Skin / nail biopsy Type of biopsy: tangential   Informed consent: discussed and consent obtained   Patient was prepped and draped in usual sterile fashion: Area prepped with alcohol. Anesthesia: the lesion was anesthetized in a standard fashion   Anesthetic:  1% lidocaine w/  epinephrine 1-100,000 buffered w/ 8.4% NaHCO3 Instrument used: flexible razor blade   Hemostasis achieved with: pressure, aluminum chloride and electrodesiccation   Outcome: patient tolerated procedure well    Destruction of lesion  Destruction method: electrodesiccation and curettage   Informed consent: discussed and consent obtained   Curettage performed in three different directions: Yes   Electrodesiccation performed over the curetted area: Yes   Final wound size (cm):  1.6 Hemostasis achieved with:  pressure, aluminum chloride and electrodesiccation Outcome: patient tolerated procedure well with no complications   Post-procedure details: wound care instructions given   Post-procedure details comment:  Ointment and bandage applied.  Specimen 3 - Surgical pathology Differential Diagnosis: r/o BCC  Check Margins: No EDC today  Right Upper Back  Skin / nail biopsy Type of biopsy: tangential   Informed consent: discussed and consent obtained   Patient was prepped and draped in usual sterile fashion: Area prepped with alcohol. Anesthesia: the lesion was anesthetized in a standard fashion   Anesthetic:  1% lidocaine w/ epinephrine 1-100,000 buffered w/ 8.4% NaHCO3 Instrument used: flexible razor blade   Hemostasis achieved with: pressure, aluminum chloride and electrodesiccation   Outcome: patient tolerated procedure well    Destruction of lesion  Destruction method: electrodesiccation and curettage   Informed consent: discussed and consent obtained   Curettage performed in three different directions: Yes   Electrodesiccation performed over the curetted area: Yes   Final wound size (cm):  2.2 Hemostasis achieved with:  pressure, aluminum chloride and electrodesiccation Outcome: patient tolerated procedure well with no complications   Post-procedure details: wound care instructions given   Post-procedure details comment:  Ointment and bandage applied.  Specimen 4 -  Surgical pathology Differential Diagnosis: r/o BCC  Check Margins: No EDC today  R nasal ala  Skin / nail biopsy Type of biopsy: tangential   Informed consent: discussed and consent obtained   Patient was prepped and draped in usual sterile fashion: Area prepped with alcohol. Anesthesia: the lesion was anesthetized in a standard fashion   Anesthetic:  1% lidocaine w/ epinephrine 1-100,000 buffered w/ 8.4% NaHCO3 Instrument used: flexible razor blade   Hemostasis achieved with: pressure, aluminum chloride and electrodesiccation   Outcome: patient tolerated procedure well   Post-procedure details: wound care instructions given   Post-procedure details comment:  Ointment and small bandage applied  Specimen 5 - Surgical pathology Differential Diagnosis: r/o BCC  Check Margins: No  Bxs #1-4 all treated with EDC today. #5 R nasal ala Plan Mohs if positive. [facility] [place], [place].  Seborrheic keratosis Right Abdomen  Reassured benign age-related growth.  Recommend observation.  Discussed cryotherapy if spot(s) become irritated or inflamed.  Telangiectasias nasal dorsum  Benign, observe.    Actinic Damage - chronic, secondary to cumulative UV radiation exposure/sun exposure over time - diffuse scaly erythematous macules with underlying dyspigmentation - Recommend daily broad spectrum sunscreen SPF 30+ to sun-exposed areas, reapply every 2 hours as needed.  - Recommend staying in the shade or wearing long sleeves, sun glasses (UVA+UVB protection) and wide brim hats (4-inch brim around the entire circumference of the hat). - Call for new or changing lesions.   Return in about  3 months (around 09/22/2022) for f/u Bxs.  IJamesetta Orleans, CMA, am acting as scribe for Brendolyn Patty, MD .  Documentation: I have reviewed the above documentation for accuracy and completeness, and I agree with the above.  Brendolyn Patty MD

## 2022-06-22 NOTE — Patient Instructions (Addendum)
Wound Care Instructions  Cleanse wound gently with soap and water once a day then pat dry with clean gauze. Apply a thin coat of Petrolatum (petroleum jelly, "Vaseline") over the wound (unless you have an allergy to this). We recommend that you use a new, sterile tube of Vaseline. Do not pick or remove scabs. Do not remove the yellow or white "healing tissue" from the base of the wound.  Cover the wound with fresh, clean, nonstick gauze and secure with paper tape. You may use Band-Aids in place of gauze and tape if the wound is small enough, but would recommend trimming much of the tape off as there is often too much. Sometimes Band-Aids can irritate the skin.  You should call the office for your biopsy report after 1 week if you have not already been contacted.  If you experience any problems, such as abnormal amounts of bleeding, swelling, significant bruising, significant pain, or evidence of infection, please call the office immediately.  FOR ADULT SURGERY PATIENTS: If you need something for pain relief you may take 1 extra strength Tylenol (acetaminophen) AND 2 Ibuprofen ('200mg'$  each) together every 4 hours as needed for pain. (do not take these if you are allergic to them or if you have a reason you should not take them.) Typically, you may only need pain medication for 1 to 3 days.   Electrodesiccation and Curettage ("Scrape and Burn") Wound Care Instructions  Leave the original bandage on for 24 hours if possible.  If the bandage becomes soaked or soiled before that time, it is OK to remove it and examine the wound.  A small amount of post-operative bleeding is normal.  If excessive bleeding occurs, remove the bandage, place gauze over the site and apply continuous pressure (no peeking) over the area for 30 minutes. If this does not work, please call our clinic as soon as possible or page your doctor if it is after hours.   Once a day, cleanse the wound with soap and water. It is fine to  shower. If a thick crust develops you may use a Q-tip dipped into dilute hydrogen peroxide (mix 1:1 with water) to dissolve it.  Hydrogen peroxide can slow the healing process, so use it only as needed.    After washing, apply petroleum jelly (Vaseline) or an antibiotic ointment if your doctor prescribed one for you, followed by a bandage.    For best healing, the wound should be covered with a layer of ointment at all times. If you are not able to keep the area covered with a bandage to hold the ointment in place, this may mean re-applying the ointment several times a day.  Continue this wound care until the wound has healed and is no longer open. It may take several weeks for the wound to heal and close.  Itching and mild discomfort is normal during the healing process.  If you have any discomfort, you can take Tylenol (acetaminophen) or ibuprofen as directed on the bottle. (Please do not take these if you have an allergy to them or cannot take them for another reason).  Some redness, tenderness and white or yellow material in the wound is normal healing.  If the area becomes very sore and red, or develops a thick yellow-green material (pus), it may be infected; please notify us.    Wound healing continues for up to one year following surgery. It is not unusual to experience pain in the scar from time to time during  the interval.  If the pain becomes severe or the scar thickens, you should notify the office.    A slight amount of redness in a scar is expected for the first six months.  After six months, the redness will fade and the scar will soften and fade.  The color difference becomes less noticeable with time.  If there are any problems, return for a post-op surgery check at your earliest convenience.  To improve the appearance of the scar, you can use silicone scar gel, cream, or sheets (such as Mederma or Serica) every night for up to one year. These are available over the counter (without a  prescription).  Please call our office at [phone] for any questions or concerns.    Due to recent changes in healthcare laws, you may see results of your pathology and/or laboratory studies on MyChart before the doctors have had a chance to review them. We understand that in some cases there may be results that are confusing or concerning to you. Please understand that not all results are received at the same time and often the doctors may need to interpret multiple results in order to provide you with the best plan of care or course of treatment. Therefore, we ask that you please give [place] 2 business days to thoroughly review all your results before contacting the office for clarification. Should we see a critical lab result, you will be contacted sooner.   If You Need Anything After Your Visit  If you have any questions or concerns for your doctor, please call our main line at [phone] and press option 4 to reach your doctor's medical assistant. If no one answers, please leave a voicemail as directed and we will return your call as soon as possible. Messages left after 4 pm will be answered the following business day.   You may also send [place] a message via [place]. We typically respond to MyChart messages within 1-2 business days.  For prescription refills, please ask your pharmacy to contact our office. Our fax number is [phone].  If you have an urgent issue when the clinic is closed that cannot wait until the next business day, you can page your doctor at the number below.    Please note that while we do our best to be available for urgent issues outside of office hours, we are not available 24/7.   If you have an urgent issue and are unable to reach [place], you may choose to seek medical care at your doctor's office, retail clinic, urgent care center, or emergency room.  If you have a medical emergency, please immediately call 911 or go to the emergency department.  Pager  Numbers  - Dr. Nehemiah Massed: [phone]  - Dr. Laurence Ferrari: [phone]  - Dr. Nicole Kindred: [phone]  In the event of inclement weather, please call our main line at [phone] for an update on the status of any delays or closures.  Dermatology Medication Tips: Please keep the boxes that topical medications come in in order to help keep track of the instructions about where and how to use these. Pharmacies typically print the medication instructions only on the boxes and not directly on the medication tubes.   If your medication is too expensive, please contact our office at [phone] option 4 or send [place] a message through [place].   We are unable to tell what your co-pay for medications will be in advance as this is different depending on your insurance coverage. However, we may  be able to find a substitute medication at lower cost or fill out paperwork to get insurance to cover a needed medication.   If a prior authorization is required to get your medication covered by your insurance company, please allow [place] 1-2 business days to complete this process.  Drug prices often vary depending on where the prescription is filled and some pharmacies may offer cheaper prices.  The website www.goodrx.com contains coupons for medications through different pharmacies. The prices here do not account for what the cost may be with help from insurance (it may be cheaper with your insurance), but the website can give you the price if you did not use any insurance.  - You can print the associated coupon and take it with your prescription to the pharmacy.  - You may also stop by our office during regular business hours and pick up a GoodRx coupon card.  - If you need your prescription sent electronically to a different pharmacy, notify our office through [facility] or by phone at [phone] option 4.     Si Usted Necesita Algo Despus de Su Visita  Tambin puede enviarnos un mensaje a travs de  Pharmacist, community. Por lo general respondemos a los mensajes de MyChart en el transcurso de 1 a 2 das hbiles.  Para renovar recetas, por favor pida a su farmacia que se ponga en contacto con nuestra oficina. Harland Dingwall de fax es [place] [phone].  Si tiene un asunto urgente cuando la clnica est cerrada y que no puede esperar hasta el siguiente da hbil, puede llamar/localizar a su doctor(a) al nmero que aparece a continuacin.   Por favor, tenga en cuenta que aunque hacemos todo lo posible para estar disponibles para asuntos urgentes fuera del horario de [place], no estamos disponibles las 24 horas del da, los 7 das de la [place].   Si tiene un problema urgente y no puede comunicarse con nosotros, puede optar por buscar atencin mdica  en el consultorio de su doctor(a), en una clnica privada, en un centro de atencin urgente o en una sala de emergencias.  Si tiene Engineering geologist, por favor llame inmediatamente al 911 o vaya a la sala de emergencias.  Nmeros de bper  - Dr. Nehemiah Massed: [phone]  - Dra. [place]: [phone]  - Dra. Nicole Kindred: [phone]  En caso de inclemencias del [place], por favor llame a Johnsie Kindred principal al [phone] para una actualizacin sobre el [place] de cualquier retraso o cierre.  Consejos para la medicacin en dermatologa: Por favor, guarde las cajas en las que vienen los medicamentos de uso tpico para ayudarle a seguir las instrucciones sobre dnde y cmo usarlos. Las farmacias generalmente imprimen las instrucciones del medicamento slo en las cajas y no directamente en los tubos del [place].   Si su medicamento es muy caro, por favor, pngase en contacto con Zigmund Daniel llamando al [phone] y presione la opcin 4 o envenos un mensaje a travs de Pharmacist, community.   No podemos decirle cul ser su copago por los medicamentos por adelantado ya que esto es diferente dependiendo de la cobertura de su seguro. Sin embargo, es posible que  podamos encontrar un medicamento sustituto a Electrical engineer un formulario para que el seguro cubra el medicamento que se considera necesario.   Si se requiere una autorizacin previa para que su compaa de seguros [place] su medicamento, por favor permtanos de 1 a 2 das hbiles para completar este proceso.  Los precios de los medicamentos varan  con frecuencia dependiendo del lugar de dnde se surte la receta y alguna farmacias pueden ofrecer precios ms baratos.  El sitio web www.goodrx.com tiene cupones para medicamentos de Airline pilot. Los precios aqu no tienen en cuenta lo que podra costar con la ayuda del seguro (puede ser ms barato con su seguro), pero el sitio web puede darle el precio si no utiliz Research scientist (physical sciences).  - Puede imprimir el cupn correspondiente y llevarlo con su receta a la farmacia.  - Tambin puede pasar por nuestra oficina durante el horario de atencin regular y Charity fundraiser una tarjeta de cupones de GoodRx.  - Si necesita que su receta se enve electrnicamente a una farmacia diferente, informe a nuestra oficina a travs de MyChart de [place] o por telfono llamando al [phone] y presione la opcin 4.

## 2022-06-27 ENCOUNTER — Telehealth: Payer: Self-pay

## 2022-06-27 DIAGNOSIS — C44311 Basal cell carcinoma of skin of nose: Secondary | ICD-10-CM

## 2022-06-27 NOTE — Telephone Encounter (Signed)
-----   Message from Brendolyn Patty, MD sent at 06/27/2022  8:29 AM EST ----- 1. Skin (M), left spinal upper back BASAL CELL CARCINOMA, NODULAR PATTERN 2. Skin (M), left spinal mid back BASAL CELL CARCINOMA, NODULAR PATTERN 3. Skin (M), left medial post shoulder SUPERFICIAL AND NODULAR BASAL CELL CARCINOMA 4. Skin (M), right upper back SUPERFICIAL AND NODULAR BASAL CELL CARCINOMA 5. 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 BCC skin cancers- all already treated with EDC at time of biopsy, f/up as scheduled for recheck. 5.  BCC skin cancer with dilated pore, needs Mohs surgery at [facility]  - please call patient

## 2022-06-27 NOTE — Telephone Encounter (Signed)
Advised pt of bx results.  Advised pt referral will be sent to [facility] in [place].

## 2022-09-05 ENCOUNTER — Telehealth: Payer: Self-pay

## 2022-09-05 NOTE — Telephone Encounter (Signed)
Specimen tracking and history updated. aw

## 2022-09-27 ENCOUNTER — Ambulatory Visit: Payer: Medicaid Other | Admitting: Dermatology

## 2022-10-05 ENCOUNTER — Ambulatory Visit: Payer: Medicaid Other | Admitting: Dermatology

## 2023-01-07 ENCOUNTER — Observation Stay (HOSPITAL_BASED_OUTPATIENT_CLINIC_OR_DEPARTMENT_OTHER)
Admit: 2023-01-07 | Discharge: 2023-01-07 | Disposition: A | Discharge status: Home / Self Care | Payer: Medicaid Other | Attending: Family Medicine | Admitting: Family Medicine

## 2023-01-07 ENCOUNTER — Other Ambulatory Visit: Payer: Self-pay

## 2023-01-07 ENCOUNTER — Encounter: Payer: Self-pay | Admitting: Emergency Medicine

## 2023-01-07 ENCOUNTER — Observation Stay
Admission: EM | Admit: 2023-01-07 | Discharge: 2023-01-08 | Disposition: A | Discharge status: Home / Self Care | Payer: Medicaid Other | Attending: Internal Medicine | Admitting: Internal Medicine

## 2023-01-07 ENCOUNTER — Observation Stay: Payer: Medicaid Other

## 2023-01-07 ENCOUNTER — Emergency Department: Payer: Medicaid Other

## 2023-01-07 DIAGNOSIS — Z7902 Long term (current) use of antithrombotics/antiplatelets: Secondary | ICD-10-CM | POA: Insufficient documentation

## 2023-01-07 DIAGNOSIS — Z794 Long term (current) use of insulin: Secondary | ICD-10-CM | POA: Diagnosis not present

## 2023-01-07 DIAGNOSIS — Z85828 Personal history of other malignant neoplasm of skin: Secondary | ICD-10-CM | POA: Insufficient documentation

## 2023-01-07 DIAGNOSIS — R2990 Unspecified symptoms and signs involving the nervous system: Secondary | ICD-10-CM | POA: Diagnosis present

## 2023-01-07 DIAGNOSIS — F444 Conversion disorder with motor symptom or deficit: Secondary | ICD-10-CM | POA: Diagnosis not present

## 2023-01-07 DIAGNOSIS — I6389 Other cerebral infarction: Secondary | ICD-10-CM | POA: Diagnosis not present

## 2023-01-07 DIAGNOSIS — R260 Ataxic gait: Secondary | ICD-10-CM | POA: Insufficient documentation

## 2023-01-07 DIAGNOSIS — R29818 Other symptoms and signs involving the nervous system: Principal | ICD-10-CM | POA: Insufficient documentation

## 2023-01-07 DIAGNOSIS — F32A Depression, unspecified: Secondary | ICD-10-CM | POA: Diagnosis not present

## 2023-01-07 DIAGNOSIS — I639 Cerebral infarction, unspecified: Secondary | ICD-10-CM | POA: Diagnosis not present

## 2023-01-07 DIAGNOSIS — R270 Ataxia, unspecified: Secondary | ICD-10-CM | POA: Diagnosis not present

## 2023-01-07 DIAGNOSIS — E119 Type 2 diabetes mellitus without complications: Secondary | ICD-10-CM | POA: Diagnosis not present

## 2023-01-07 DIAGNOSIS — F17210 Nicotine dependence, cigarettes, uncomplicated: Secondary | ICD-10-CM | POA: Insufficient documentation

## 2023-01-07 DIAGNOSIS — Z7982 Long term (current) use of aspirin: Secondary | ICD-10-CM | POA: Insufficient documentation

## 2023-01-07 DIAGNOSIS — Z79899 Other long term (current) drug therapy: Secondary | ICD-10-CM | POA: Diagnosis not present

## 2023-01-07 DIAGNOSIS — R259 Unspecified abnormal involuntary movements: Secondary | ICD-10-CM

## 2023-01-07 LAB — GLUCOSE, CAPILLARY
Glucose-Capillary: 254 mg/dL — ABNORMAL HIGH (ref 70–99)
Glucose-Capillary: 208 mg/dL — ABNORMAL HIGH (ref 70–99)

## 2023-01-07 LAB — CBC WITH DIFFERENTIAL/PLATELET
Abs Immature Granulocytes: 0.02 10*3/uL (ref 0.00–0.07)
Basophils Absolute: 0.1 10*3/uL (ref 0.0–0.1)
Basophils Relative: 1 %
Eosinophils Absolute: 0.2 10*3/uL (ref 0.0–0.5)
Eosinophils Relative: 3 %
HCT: 41.6 % (ref 39.0–52.0)
Hemoglobin: 14.1 g/dL (ref 13.0–17.0)
Immature Granulocytes: 0 %
Lymphocytes Relative: 28 %
Lymphs Abs: 1.9 10*3/uL (ref 0.7–4.0)
MCH: 31 pg (ref 26.0–34.0)
MCHC: 33.9 g/dL (ref 30.0–36.0)
MCV: 91.4 fL (ref 80.0–100.0)
Monocytes Absolute: 0.5 10*3/uL (ref 0.1–1.0)
Monocytes Relative: 7 %
Neutro Abs: 4.1 10*3/uL (ref 1.7–7.7)
Neutrophils Relative %: 61 %
Platelets: 293 10*3/uL (ref 150–400)
RBC: 4.55 MIL/uL (ref 4.22–5.81)
RDW: 12.1 % (ref 11.5–15.5)
WBC: 6.8 10*3/uL (ref 4.0–10.5)
nRBC: 0 % (ref 0.0–0.2)

## 2023-01-07 LAB — URINE DRUG SCREEN, QUALITATIVE (ARMC ONLY)
Amphetamines, Ur Screen: NOT DETECTED
Barbiturates, Ur Screen: NOT DETECTED
Benzodiazepine, Ur Scrn: NOT DETECTED
Cannabinoid 50 Ng, Ur ~~LOC~~: POSITIVE — AB
Cocaine Metabolite,Ur ~~LOC~~: NOT DETECTED
MDMA (Ecstasy)Ur Screen: NOT DETECTED
Methadone Scn, Ur: NOT DETECTED
Opiate, Ur Screen: NOT DETECTED
Phencyclidine (PCP) Ur S: NOT DETECTED
Tricyclic, Ur Screen: NOT DETECTED

## 2023-01-07 LAB — COMPREHENSIVE METABOLIC PANEL
ALT: 22 U/L (ref 0–44)
AST: 27 U/L (ref 15–41)
Albumin: 3.8 g/dL (ref 3.5–5.0)
Alkaline Phosphatase: 71 U/L (ref 38–126)
Anion gap: 8 (ref 5–15)
BUN: 15 mg/dL (ref 6–20)
CO2: 24 mmol/L (ref 22–32)
Calcium: 9.3 mg/dL (ref 8.9–10.3)
Chloride: 102 mmol/L (ref 98–111)
Creatinine, Ser: 0.92 mg/dL (ref 0.61–1.24)
GFR, Estimated: >60 mL/min (ref >60)
Glucose, Bld: 329 mg/dL — ABNORMAL HIGH (ref 70–99)
Potassium: 3.9 mmol/L (ref 3.5–5.1)
Sodium: 134 mmol/L — ABNORMAL LOW (ref 135–145)
Total Bilirubin: 0.7 mg/dL (ref 0.3–1.2)
Total Protein: 6.8 g/dL (ref 6.5–8.1)

## 2023-01-07 LAB — HEMOGLOBIN A1C
Hgb A1c MFr Bld: 9.7 % — ABNORMAL HIGH (ref 4.8–5.6)
Mean Plasma Glucose: 231.69 mg/dL

## 2023-01-07 LAB — PROTIME-INR
INR: 1.1 (ref 0.8–1.2)
Prothrombin Time: 14 seconds (ref 11.4–15.2)

## 2023-01-07 LAB — HIV ANTIBODY (ROUTINE TESTING W REFLEX): HIV Screen 4th Generation wRfx: NONREACTIVE

## 2023-01-07 LAB — TSH: TSH: 1.943 u[IU]/mL (ref 0.350–4.500)

## 2023-01-07 LAB — ECHOCARDIOGRAM COMPLETE BUBBLE STUDY
AR max vel: 2.8 cm2
AV Area VTI: 2.91 cm2
AV Area mean vel: 2.58 cm2
AV Mean grad: 4 mmHg
AV Peak grad: 7.4 mmHg
Ao pk vel: 1.36 m/s
Area-P 1/2: 3.17 cm2
S' Lateral: 2.3 cm

## 2023-01-07 LAB — CBG MONITORING, ED
Glucose-Capillary: 240 mg/dL — ABNORMAL HIGH (ref 70–99)
Glucose-Capillary: 216 mg/dL — ABNORMAL HIGH (ref 70–99)

## 2023-01-07 LAB — APTT: aPTT: 27 seconds (ref 24–36)

## 2023-01-07 LAB — ETHANOL: Alcohol, Ethyl (B): <10 mg/dL (ref <10)

## 2023-01-07 MED ORDER — CLOPIDOGREL BISULFATE 75 MG PO TABS
75.0000 mg | ORAL_TABLET | Freq: Every day | ORAL | Status: DC
  Administered 2023-01-07: 75 mg via ORAL
  Filled 2023-01-07: qty 1

## 2023-01-07 MED ORDER — INSULIN ASPART 100 UNIT/ML IJ SOLN
0.0000 [IU] | Freq: Every day | INTRAMUSCULAR | Status: DC
  Administered 2023-01-07: 2 [IU] via SUBCUTANEOUS
  Filled 2023-01-07: qty 1

## 2023-01-07 MED ORDER — ALPRAZOLAM 0.5 MG PO TABS: 0.5000 mg | ORAL_TABLET | Freq: Two times a day (BID) | ORAL | Status: DC | PRN

## 2023-01-07 MED ORDER — ASPIRIN 81 MG PO TBEC
81.0000 mg | DELAYED_RELEASE_TABLET | Freq: Every day | ORAL | Status: DC
  Administered 2023-01-07: 81 mg via ORAL
  Filled 2023-01-07: qty 1

## 2023-01-07 MED ORDER — ENOXAPARIN SODIUM 40 MG/0.4ML IJ SOSY
40.0000 mg | PREFILLED_SYRINGE | INTRAMUSCULAR | Status: DC
  Administered 2023-01-07: 40 mg via SUBCUTANEOUS
  Filled 2023-01-07 (×2): qty 0.4

## 2023-01-07 MED ORDER — LORAZEPAM 2 MG/ML IJ SOLN
1.0000 mg | INTRAMUSCULAR | Status: DC | PRN
  Administered 2023-01-07: 1 mg via INTRAVENOUS
  Filled 2023-01-07: qty 1

## 2023-01-07 MED ORDER — TRAZODONE HCL 50 MG PO TABS
100.0000 mg | ORAL_TABLET | Freq: Every evening | ORAL | Status: DC | PRN
  Filled 2023-01-07: qty 2

## 2023-01-07 MED ORDER — SODIUM CHLORIDE 0.9 % IV SOLN: INTRAVENOUS | Status: DC

## 2023-01-07 MED ORDER — VENLAFAXINE HCL ER 75 MG PO CP24
150.0000 mg | ORAL_CAPSULE | Freq: Every day | ORAL | Status: DC
  Administered 2023-01-07 – 2023-01-08 (×2): 150 mg via ORAL
  Filled 2023-01-07: qty 2
  Filled 2023-01-07: qty 1

## 2023-01-07 MED ORDER — IOHEXOL 350 MG/ML SOLN
75.0000 mL | Freq: Once | INTRAVENOUS | Status: AC | PRN
  Administered 2023-01-07: 75 mL via INTRAVENOUS

## 2023-01-07 MED ORDER — ONDANSETRON HCL 4 MG/2ML IJ SOLN: 4.0000 mg | INTRAMUSCULAR | Status: DC | PRN

## 2023-01-07 MED ORDER — ATORVASTATIN CALCIUM 20 MG PO TABS
20.0000 mg | ORAL_TABLET | Freq: Every day | ORAL | Status: DC
  Administered 2023-01-08: 20 mg via ORAL
  Filled 2023-01-07 (×3): qty 1

## 2023-01-07 MED ORDER — STROKE: EARLY STAGES OF RECOVERY BOOK: Freq: Once | Status: DC

## 2023-01-07 MED ORDER — DIAZEPAM 5 MG/ML IJ SOLN: 2.5000 mg | Freq: Once | INTRAMUSCULAR | Status: DC | PRN

## 2023-01-07 MED ORDER — INSULIN ASPART 100 UNIT/ML IJ SOLN
0.0000 [IU] | Freq: Three times a day (TID) | INTRAMUSCULAR | Status: DC
  Administered 2023-01-07: 5 [IU] via SUBCUTANEOUS
  Administered 2023-01-07 – 2023-01-08 (×3): 3 [IU] via SUBCUTANEOUS
  Filled 2023-01-07 (×4): qty 1

## 2023-01-07 MED ORDER — GADOBUTROL 1 MMOL/ML IV SOLN
7.5000 mL | Freq: Once | INTRAVENOUS | Status: AC | PRN
  Administered 2023-01-07: 7.5 mL via INTRAVENOUS

## 2023-01-07 MED ORDER — CLONAZEPAM 0.5 MG PO TABS
0.5000 mg | ORAL_TABLET | Freq: Two times a day (BID) | ORAL | Status: DC
  Administered 2023-01-07 – 2023-01-08 (×2): 0.5 mg via ORAL
  Filled 2023-01-07 (×3): qty 1

## 2023-01-07 MED ORDER — DIPHENHYDRAMINE HCL 25 MG PO CAPS: 25.0000 mg | ORAL_CAPSULE | Freq: Four times a day (QID) | ORAL | Status: DC | PRN

## 2023-01-07 MED ORDER — CLONAZEPAM 0.5 MG PO TABS: 0.5000 mg | ORAL_TABLET | Freq: Three times a day (TID) | ORAL | Status: DC | PRN

## 2023-01-07 MED ORDER — SENNOSIDES-DOCUSATE SODIUM 8.6-50 MG PO TABS: 1.0000 | ORAL_TABLET | Freq: Every evening | ORAL | Status: DC | PRN

## 2023-01-07 MED ORDER — MAGNESIUM HYDROXIDE 400 MG/5ML PO SUSP: 30.0000 mL | Freq: Every day | ORAL | Status: DC | PRN

## 2023-01-07 MED ORDER — CLONAZEPAM 0.5 MG PO TABS: 0.5000 mg | ORAL_TABLET | Freq: Two times a day (BID) | ORAL | Status: DC

## 2023-01-07 MED ORDER — ACETAMINOPHEN 160 MG/5ML PO SOLN: 650.0000 mg | ORAL | Status: DC | PRN

## 2023-01-07 MED ORDER — ACETAMINOPHEN 325 MG PO TABS: 650.0000 mg | ORAL_TABLET | ORAL | Status: DC | PRN

## 2023-01-07 MED ORDER — ACETAMINOPHEN 650 MG RE SUPP: 650.0000 mg | RECTAL | Status: DC | PRN

## 2023-01-07 NOTE — Plan of Care (Signed)

## 2023-01-07 NOTE — Assessment & Plan Note (Signed)
-   The patient will be placed on supplemental coverage with [employer]. 

## 2023-01-07 NOTE — Assessment & Plan Note (Signed)
Generalized Anxiety Life stressors Continue Effexor XR  No longer taking trazodone & would avoid in combination with Effexor due to higher risk on serotonin syndrome when combined. 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

## 2023-01-07 NOTE — Consult Note (Addendum)
Neurology Consultation  Reason for Consult: Abnormal movements Referring Physician: Dr. Denton Lank  CC: Abnormal shoulder movements/jerking movements  History is obtained from: Patient, chart  HPI: Edgar White is a 57 y.o. male past medical history of multiple locations of basal cell carcinoma, depression, diabetes presenting to the emergency room for evaluation of 3 weeks worth of progressively worsening whole body jerking movements.  He says that there is no exacerbating or relieving factors and he has sudden jerking movements that predominantly involves his shoulders.  He was able to talk to me completely without those and when I asked him how these movements occur, he said if I relax these movements will come on.  He then sat back in bed and had appearance of what looked like shrugging of both shoulders with some internal rotation of the shoulders-very suspicious for a volitional appearing pattern of these movements. Reports extra stressors-recent biopsy for a possible oral cancer diagnosis.  Also has had stressors related to financial disputes with his siblings and loss of parents etc.  Has had a hard time finding a regimen of antidepressant medications that works for him.  His Effexor was increased in December and is the only medication that works for him. Reports stable depressive symptoms.  No suicidal or homicidal ideation.  Reports occasional amphetamine use and THC use.  Initially admitted for concern for stroke.  MRI of the brain with and without contrast negative for stroke or met.  C-spine MRI with DJD.  ROS: Full ROS was performed and is negative except as noted in the HPI.   Past Medical History:  Diagnosis Date   Basal cell carcinoma 06/22/2022   L medial post shoulder, [facility]   Basal cell carcinoma 06/22/2022   L spinal mid back, [facility]   Basal cell carcinoma 06/22/2022   L spinal upper back, [facility]   Basal cell carcinoma 06/22/2022   R nasal ala, MOHs completed 08/09/22    Basal cell carcinoma 06/22/2022   R upper back, [facility]   Depression    Diabetes mellitus without complication (HCC)      Family History  Problem Relation Age of Onset   Cancer Mother    Basal cell carcinoma Sister    Squamous cell carcinoma Sister      Social History:   reports that he has been smoking cigarettes. He has been smoking an average of .5 packs per day. He has never used smokeless tobacco. He reports current drug use. Drugs: Marijuana and Cocaine. He reports that he does not drink alcohol.  Medications  Current Facility-Administered Medications:    acetaminophen (TYLENOL) tablet 650 mg, 650 mg, Oral, Q4H PRN **OR** acetaminophen (TYLENOL) 160 MG/5ML solution 650 mg, 650 mg, Per Tube, Q4H PRN **OR** acetaminophen (TYLENOL) suppository 650 mg, 650 mg, Rectal, Q4H PRN, Mansy, Jan A, MD   aspirin EC tablet 81 mg, 81 mg, Oral, Daily, Mansy, Jan A, MD, 81 mg at 01/07/23 0910   atorvastatin (LIPITOR) tablet 20 mg, 20 mg, Oral, Daily, Mansy, Jan A, MD   clonazePAM (KLONOPIN) tablet 0.5 mg, 0.5 mg, Oral, TID PRN, Esaw Grandchild A, DO   clopidogrel (PLAVIX) tablet 75 mg, 75 mg, Oral, Daily, Mansy, Jan A, MD, 75 mg at 01/07/23 0910   diphenhydrAMINE (BENADRYL) capsule 25 mg, 25 mg, Oral, Q6H PRN, Esaw Grandchild A, DO   enoxaparin (LOVENOX) injection 40 mg, 40 mg, Subcutaneous, Q24H, Mansy, Jan A, MD, 40 mg at 01/07/23 0910   insulin aspart (novoLOG) injection 0-5 Units, 0-5 Units,  Subcutaneous, QHS, Mansy, Jan A, MD   insulin aspart (novoLOG) injection 0-9 Units, 0-9 Units, Subcutaneous, TID WC, Mansy, Jan A, MD, 3 Units at 01/07/23 1210   magnesium hydroxide (MILK OF MAGNESIA) suspension 30 mL, 30 mL, Oral, Daily PRN, Mansy, Jan A, MD   ondansetron [facility]) injection 4 mg, 4 mg, Intravenous, Q4H PRN, Mansy, Vernetta Honey, MD   senna-docusate (Senokot-S) tablet 1 tablet, 1 tablet, Oral, QHS PRN, Mansy, Jan A, MD   traZODone (DESYREL) tablet 100 mg, 100 mg, Oral, QHS PRN, Mansy, Jan A, MD    venlafaxine XR (EFFEXOR-XR) 24 hr capsule 150 mg, 150 mg, Oral, Q breakfast, Mansy, Jan A, MD, 150 mg at 01/07/23 1610   Exam: Current vital signs: BP (!) 159/102   Pulse 73   Temp 98.1 F (36.7 C) (Oral)   Resp 15   Ht 5\' 10"  (1.778 m)   Wt 74.8 kg   SpO2 98%   BMI 23.68 kg/m  Vital signs in last 24 hours: Temp:  [98.1 F (36.7 C)-98.8 F (37.1 C)] 98.1 F (36.7 C) (05/18 0625) Pulse Rate:  [73-101] 73 (05/18 1212) Resp:  [15-20] 15 (05/18 1212) BP: (134-174)/(84-102) 159/102 (05/18 1212) SpO2:  [96 %-98 %] 98 % (05/18 1212) Weight:  [74.8 kg] 74.8 kg (05/18 0052) General: Awake alert in no distress HEENT: Normocephalic atraumatic Lungs: Clear Abdomen nondistended nontender Neurological exam He is awake alert oriented x 3.  No dysarthria.  No aphasia Occasional shoulder jerks as described above. Cranials 2-12 intact Motor examination with no drift Sensation intact Coordination examination with no dysmetria DTRs 2-3+ without clonus.  Toes downgoing. NIH stroke scale 0    Labs I have reviewed labs in epic and the results pertinent to this consultation are:  CBC    Component Value Date/Time   WBC 6.8 01/07/2023 0100   RBC 4.55 01/07/2023 0100   HGB 14.1 01/07/2023 0100   HGB 16.1 06/13/2013 1329   HCT 41.6 01/07/2023 0100   HCT 45.0 06/13/2013 1329   PLT 293 01/07/2023 0100   PLT 217 06/13/2013 1329   MCV 91.4 01/07/2023 0100   MCV 91 06/13/2013 1329   MCH 31.0 01/07/2023 0100   MCHC 33.9 01/07/2023 0100   RDW 12.1 01/07/2023 0100   RDW 12.4 06/13/2013 1329   LYMPHSABS 1.9 01/07/2023 0100   LYMPHSABS 2.0 06/13/2013 1329   MONOABS 0.5 01/07/2023 0100   MONOABS 0.5 06/13/2013 1329   EOSABS 0.2 01/07/2023 0100   EOSABS 0.1 06/13/2013 1329   BASOSABS 0.1 01/07/2023 0100   BASOSABS 0.1 06/13/2013 1329    CMP     Component Value Date/Time   NA 134 (L) 01/07/2023 0100   NA 135 (L) 06/13/2013 1329   K 3.9 01/07/2023 0100   K 4.1 06/13/2013 1329   CL  102 01/07/2023 0100   CL 101 06/13/2013 1329   CO2 24 01/07/2023 0100   CO2 26 06/13/2013 1329   GLUCOSE 329 (H) 01/07/2023 0100   GLUCOSE 278 (H) 06/13/2013 1329   BUN 15 01/07/2023 0100   BUN 12 06/13/2013 1329   CREATININE 0.92 01/07/2023 0100   CREATININE 1.21 06/13/2013 1329   CALCIUM 9.3 01/07/2023 0100   CALCIUM 9.6 06/13/2013 1329   PROT 6.8 01/07/2023 0100   PROT 8.0 06/03/2013 1134   ALBUMIN 3.8 01/07/2023 0100   ALBUMIN 4.1 06/03/2013 1134   AST 27 01/07/2023 0100   AST 51 (H) 06/03/2013 1134   ALT 22 01/07/2023 0100   ALT  76 06/03/2013 1134   ALKPHOS 71 01/07/2023 0100   ALKPHOS 89 06/03/2013 1134   BILITOT 0.7 01/07/2023 0100   BILITOT 0.6 06/03/2013 1134   GFRNONAA >60 01/07/2023 0100   GFRNONAA >60 06/13/2013 1329   GFRAA >60 02/02/2018 1606   GFRAA >60 06/13/2013 1329    Imaging I have reviewed the images obtained: MR brain with and without contrast-no evidence of acute stroke or metastatic disease. 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  No canal stenosis.  Assessment:  57 year old with extensive psychiatric history presenting for evaluation of 3 weeks progressive worsening of intermittent whole body jerking movements. I witnessed some of these episodes as described above and they looked very psychogenic in etiology. Examination does not favor serotonin syndrome-she is on Effexor and med list also says he is on trazodone but he has not taken trazodone for months. At this time a psychogenic etiology is more likely. Multiple stressors including possible diagnosis of colon cancer and family disputes are causing a lot of depression and anxiety that he shared with me.   Impression Functional neurological disorder with abnormal movements Admitted for concern for stroke-MRI negative.  Clinical picture also not consistent with stroke.  Please do not put  stroke in the diagnosis on discharge.  Recommendations: Start a trial of Klonopin-start with 0.5 mg twice daily. Outpatient follow-up for further adjustments with his psychiatrist. I would favor observing him overnight just to make sure that he is not having any other side effects from the medication. He does not have a stroke on MRI-neither does his story fit of strokes-no need for antiplatelets. Will defer statin therapy to primary team-no stroke so not needed from a stroke standpoint. Plan was discussed with Dr. Denton Lank and the patient.    -- Milon Dikes, MD Neurologist [facility] Pager: [phone]

## 2023-01-07 NOTE — Assessment & Plan Note (Addendum)
Functional neurologic disorder with abnormal movements Seen by Neurology. Stroke and metastatic disease, cervical spine etiology all ruled out. Symptoms improved with Klonopin recommended by Neurology. Close psychiatry follow up Recommend psychologist for talk therapy as additional measure for coping with stress and mental health challenges.

## 2023-01-07 NOTE — ED Triage Notes (Signed)
Pt presents ambulatory to triage via POV with complaints of abnormal jerking for the last 2-3 weeks. Pt seen by PCP for same who is currently researching his sx. Pt notes coming in tonight because " I need answers". Of note, the patient was recently told he had oral cancer and it was biopsied on Tuesday. A&Ox4 at this time. Denies fevers, chills, N/V/D, CP or SOB.

## 2023-01-07 NOTE — Evaluation (Signed)
Physical Therapy Evaluation Patient Details Name: Edgar White MRN: [id number] DOB: 01-18-1966 Today's Date: 01/07/2023  History of Present Illness  Edgar White is a 57 y.o. Caucasian male with medical history significant for type diabetes mellitus and depression, who presented to the emergency room with acute onset of abnormal movements with intermittent jerking of both shoulders and had with some numbness and weakness in the left arm 2 weeks ago with resolved.  The patient has been feeling unsteady on his feet with poor coordination and felt like his legs are going to give out on him. He has recently been diagnosed with oral cancer.   Clinical Impression  Patient received sittin gup with OT in room. He is able to don socks and shoes without difficulty in seated position. Patient is able to stand with supervision. Ambulated 150 feet with min guard. Patient with overall jerking motions in UEs and LEs. Knees buckling but no overt losing of balance. Patient is at increased fall risk and will continue to  benefit from skilled PT to improve safety with mobility.         Recommendations for follow up therapy are one component of a multi-disciplinary discharge planning process, led by the attending physician.  Recommendations may be updated based on patient status, additional functional criteria and insurance authorization.  Follow Up Recommendations       Assistance Recommended at Discharge PRN  Patient can return home with the following  A little help with walking and/or transfers;A little help with bathing/dressing/bathroom;Assist for transportation;Help with stairs or ramp for entrance    Equipment Recommendations None recommended by PT;Other (comment) (TBD)  Recommendations for Other Services       Functional Status Assessment Patient has had a recent decline in their functional status and demonstrates the ability to make significant improvements in function in a reasonable and  predictable amount of time.     Precautions / Restrictions Precautions Precautions: Fall Precaution Comments: 3 recent falls Restrictions Weight Bearing Restrictions: No      Mobility  Bed Mobility Overal bed mobility: Independent                  Transfers Overall transfer level: Independent                      Ambulation/Gait Ambulation/Gait assistance: Min guard Gait Distance (Feet): 150 Feet Assistive device: None Gait Pattern/deviations: Step-through pattern, Decreased step length - right, Decreased step length - left, Decreased stride length Gait velocity: decr     General Gait Details: patient has all over jerking motions during mobility. in UEs and LEs. He is at increased fall risk.  Stairs            Wheelchair Mobility    Modified Rankin (Stroke Patients Only)       Balance   Sitting-balance support: Feet supported Sitting balance-Leahy Scale: Good     Standing balance support: No upper extremity supported, During functional activity Standing balance-Leahy Scale: Fair Standing balance comment: overall jerking throughout mobility. Knees buckling at times. Increased fall risk                             Pertinent Vitals/Pain Pain Assessment Pain Assessment: No/denies pain    Home Living Family/patient expects to be discharged to:: Private residence Living Arrangements: Alone Available Help at Discharge: Family;Available PRN/intermittently Type of Home: Apartment Home Access: Stairs to enter Entrance Stairs-Rails: Right Entrance  Stairs-Number of Steps: 10   Home Layout: One level Home Equipment: None Additional Comments: basement apartment    Prior Function Prior Level of Function : Independent/Modified Independent;Working/employed;Driving;History of Falls (last six months)             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) ADLs Comments: independent     Higher education careers adviser         Extremity/Trunk Assessment   Upper Extremity Assessment Upper Extremity Assessment: Overall WFL for tasks assessed    Lower Extremity Assessment Lower Extremity Assessment: Overall WFL for tasks assessed    Cervical / Trunk Assessment Cervical / Trunk Assessment: Normal  Communication   Communication: No difficulties  Cognition Arousal/Alertness: Awake/alert Behavior During Therapy: WFL for tasks assessed/performed Overall Cognitive Status: Within Functional Limits for tasks assessed                                          General Comments      Exercises     Assessment/Plan    PT Assessment Patient needs continued PT services  PT Problem List Decreased strength;Decreased range of motion;Decreased activity tolerance;Decreased balance;Decreased mobility;Decreased knowledge of precautions       PT Treatment Interventions DME instruction;Gait training;Stair training;Functional mobility training;Therapeutic activities;Patient/family education;Balance training;Therapeutic exercise    PT Goals (Current goals can be found in the Care Plan section)  Acute Rehab PT Goals Patient Stated Goal: to improve PT Goal Formulation: With patient Time For Goal Achievement: 01/20/23 Potential to Achieve Goals: Good    Frequency Min 3X/week     Co-evaluation               AM-PAC PT "6 Clicks" Mobility  Outcome Measure Help needed turning from your back to your side while in a flat bed without using bedrails?: None Help needed moving from lying on your back to sitting on the side of a flat bed without using bedrails?: None Help needed moving to and from a bed to a chair (including a wheelchair)?: A Little Help needed standing up from a chair using your arms (e.g., wheelchair or bedside chair)?: None Help needed to walk in hospital room?: A Little Help needed climbing 3-5 steps with a railing? : A Little 6 Click Score: 21    End of Session   Activity  Tolerance: Patient tolerated treatment well Patient left: in chair;with call bell/phone within reach Nurse Communication: Mobility status PT Visit Diagnosis: Unsteadiness on feet (R26.81);Other abnormalities of gait and mobility (R26.89);Difficulty in walking, not elsewhere classified (R26.2);Repeated falls (R29.6)    Time: 8295-6213 PT Time Calculation (min) (ACUTE ONLY): 27 min   Charges:   PT Evaluation $PT Eval Moderate Complexity: 1 Mod          Arthi Mcdonald, PT, GCS 01/07/23,4:17 PM

## 2023-01-07 NOTE — Evaluation (Signed)
Occupational Therapy Evaluation Patient Details Name: Edgar White MRN: [id number] DOB: January 29, 1966 Today's Date: 01/07/2023   History of Present Illness Edgar White is a 57 y.o. Caucasian male with medical history significant for type diabetes mellitus and depression, who presented to the emergency room with acute onset of abnormal movements with intermittent jerking of both shoulders and had with some numbness and weakness in the left arm 2 weeks ago with resolved.  The patient has been feeling unsteady on his feet with poor coordination and felt like his legs are going to give out on him. He has recently been diagnosed with oral cancer.   Clinical Impression   Edgar White was seen for OT evaluation this date. Prior to hospital admission, pt was IND. Pt lives alone in basement apartment c 10 STE. Pt currently IND don shirt and shoes in sitting, increased time to tie shoes related to jerking movements. CGA for ADL t/f ~200 ft, jerking chest movements noted and R knee buckling, pt corrects however remains falls risk. Pt would benefit from skilled OT to address noted impairments and functional limitations (see below for any additional details). Upon hospital discharge, recommend no OT follow up.     Recommendations for follow up therapy are one component of a multi-disciplinary discharge planning process, led by the attending physician.  Recommendations may be updated based on patient status, additional functional criteria and insurance authorization.   Assistance Recommended at Discharge Set up Supervision/Assistance  Patient can return home with the following A little help with walking and/or transfers;A little help with bathing/dressing/bathroom;Help with stairs or ramp for entrance    Functional Status Assessment  Patient has had a recent decline in their functional status and demonstrates the ability to make significant improvements in function in a reasonable and predictable amount of  time.  Equipment Recommendations  Tub/shower seat    Recommendations for Other Services       Precautions / Restrictions Precautions Precautions: Fall Precaution Comments: 3 recent falls Restrictions Weight Bearing Restrictions: No      Mobility Bed Mobility Overal bed mobility: Independent                  Transfers Overall transfer level: Independent                        Balance Overall balance assessment: Needs assistance Sitting-balance support: No upper extremity supported Sitting balance-Leahy Scale: Normal     Standing balance support: No upper extremity supported Standing balance-Leahy Scale: Fair                             ADL either performed or assessed with clinical judgement   ADL Overall ADL's : Needs assistance/impaired                                       General ADL Comments: IND don shirt and shoes insitting, increased time to tie shoes related to jerking movements. CGA for ADL t/f, jerking chest movements noted and R knee buckling, pt corrects      Pertinent Vitals/Pain Pain Assessment Pain Assessment: No/denies pain     Hand Dominance     Extremity/Trunk Assessment Upper Extremity Assessment Upper Extremity Assessment: Overall WFL for tasks assessed   Lower Extremity Assessment Lower Extremity Assessment: Overall WFL for tasks assessed  Cervical / Trunk Assessment Cervical / Trunk Assessment: Normal   Communication Communication Communication: No difficulties   Cognition Arousal/Alertness: Awake/alert Behavior During Therapy: WFL for tasks assessed/performed Overall Cognitive Status: Within Functional Limits for tasks assessed                                                  Home Living Family/patient expects to be discharged to:: Private residence Living Arrangements: Alone Available Help at Discharge: Family;Available PRN/intermittently Type of Home:  Apartment Home Access: Stairs to enter Entrance Stairs-Number of Steps: 10 Entrance Stairs-Rails: Right Home Layout: One level               Home Equipment: None   Additional Comments: basement apartment      Prior Functioning/Environment Prior Level of Function : Independent/Modified Independent;Working/employed;Driving;History of Falls (last six months)             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 Comments: independent        OT Problem List: Decreased activity tolerance;Impaired balance (sitting and/or standing)      OT Treatment/Interventions: Self-care/ADL training;Therapeutic exercise;Energy conservation;DME and/or AE instruction;Therapeutic activities;Balance training;Patient/family education    OT Goals(Current goals can be found in the care plan section) Acute Rehab OT Goals Patient Stated Goal: to improve balance OT Goal Formulation: With patient Time For Goal Achievement: 01/21/23 Potential to Achieve Goals: Good ADL Goals Pt Will Perform Tub/Shower Transfer: Tub transfer;Independently  OT Frequency: Min 1X/week    Co-evaluation PT/OT/SLP Co-Evaluation/Treatment: Yes Reason for Co-Treatment: For patient/therapist safety PT goals addressed during session: Mobility/safety with mobility OT goals addressed during session: ADL's and self-care      AM-PAC OT "6 Clicks" Daily Activity     Outcome Measure Help from another person eating meals?: None Help from another person taking care of personal grooming?: None Help from another person toileting, which includes using toliet, bedpan, or urinal?: None Help from another person bathing (including washing, rinsing, drying)?: A Little Help from another person to put on and taking off regular upper body clothing?: None Help from another person to put on and taking off regular lower body clothing?: None 6 Click Score: 23   End of Session    Activity Tolerance: Patient tolerated  treatment well Patient left: in bed;with call bell/phone within reach;with bed alarm set  OT Visit Diagnosis: Other abnormalities of gait and mobility (R26.89);Muscle weakness (generalized) (M62.81)                Time: 1535-1550 OT Time Calculation (min): 15 min Charges:  OT General Charges $OT Visit: 1 Visit OT Evaluation $OT Eval Low Complexity: 1 Low  Kathie Dike, M.S. OTR/L  01/07/23, 4:19 PM  ascom [phone]

## 2023-01-07 NOTE — Progress Notes (Signed)
SLP Cancellation Note  Patient Details Name: Edgar White MRN: [id number] DOB: 1965/10/15   Cancelled treatment:       Reason Eval/Treat Not Completed: Patient at procedure or test/unavailable (will f/u tomorrow) Pt currently OTF at MR/Cervical Spine. ST services will f/u w/ any needed assessment tomorrow. Pt is on a Regular consistency diet.      Jerilynn Som, MS, CCC-SLP Speech Language Pathologist Rehab Services; [facility] [phone] (ascom) [name] 01/07/2023, 1:55 PM

## 2023-01-07 NOTE — Progress Notes (Signed)
Brief rounding note, same day as admission.   HPI: 56 yo male admitted earlier this AM for evaluation of intermittent involuntary jerking movements of his mainly upper body and difficulty balancing, with numbess in left > right hand.  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  The lesion had been present under his tongue for about 2 years, then began to grow and become tender, prompting him to seek evaluation for it.   See full H&P for additional HPI on admission.  Interval history: Pt seen in the ED holding for a bed.  We reviewed MRI and CTA results showing no acute stroke, old strokes, chronically occluded R ICA finding.  He recalls stroke-like symptoms while at work in 2018 that resolved and he never sought evaluation at the time.   He is anxious and concerned that the oral cancer has spread and causing current symptoms. Tearful at times during the encounter.  No other new or acute complaints.  He is agreeable to further imaging including MRI with contrast to evaluate for possible metastatic disease.  Exam: General exam: awake, alert, no acute distress HEENT: bilateral cervical lymphadenopathy non-tender, moist mucus membranes, hearing grossly normal  Respiratory system: CTAB, no wheezes, rales or rhonchi, normal respiratory effort. Cardiovascular system: normal S1/S2, RRR, no JVD, murmurs, rubs, gallops, no pedal edema.   Gastrointestinal system: soft, NT, ND, no HSM felt, +bowel sounds. Central nervous system: A&O x 4. negative [place], normal speech Extremities: intermittent jerking of bilateral shoulders, unsteady ambulation Skin: dry, intact, normal temperature, normal color,No rashes, lesions or ulcers Psychiatry: anxious mood, congruent affect, judgement and insight appear normal    A&P: as per H&P by Dr. Arville Care, with any changes or additions as below:  --MRI brain with contrast --MRI C-spine w/wo  contrast --Neurology reviewed case, negative for stroke, will follow up additional imaging --Review PCP records & recent work up (?concern for hormonal derangements causing recent weight gain, muscle bulking, hair growth)     No charge

## 2023-01-07 NOTE — ED Notes (Signed)
Pt watch left in ED rm 15. This tech went to rm on the floor at pt was not there. Pt watch labeled and left in lost and found in the ER.

## 2023-01-07 NOTE — H&P (Signed)
[place]   PATIENT NAME: Edgar White    MR#:  [id number]  DATE OF BIRTH:  04-01-1966  DATE OF ADMISSION:  01/07/2023  PRIMARY CARE PHYSICIAN: Dortha Kern, MD   Patient is coming from: Home  REQUESTING/REFERRING PHYSICIAN: Chesley Noon, MD  CHIEF COMPLAINT:   Chief Complaint  Patient presents with   Abnormal Movements    HISTORY OF PRESENT ILLNESS:  Edgar White is a 57 y.o. Caucasian male with medical history significant for type diabetes mellitus and depression, who presented to the emergency room with acute onset of abnormal movements with intermittent jerking of both shoulders and had with some numbness and weakness in the left arm 2 weeks ago with resolved.  The patient has been feeling unsteady on his feet with poor coordination and felt like his legs are going to give out on him.  He used methamphetamine 3 times since February but denied any correlation to his symptoms.  No history of alcohol abuse or tobacco abuse but admitted to marijuana abuse.  No dysphagia or dysarthria.  No fever or chills.  No chest pain or palpitations.  No cough or wheezing or hemoptysis.  No other bleeding diathesis.  No urinary or stool incontinence.  ED Course: When the patient came to the ER, BP was 165/87 with heart rate of 101 and otherwise vital signs were within normal and later respiratory rate was 21.  Labs revealed mild hyponatremia of 134 and 6 hyperglycemia of 329 with otherwise unremarkable CMP and CBC.  Coag profile was within normal.  Urine drug screen was positive for cannabinoids. EKG as reviewed by me : Pending. Imaging: Noncontrast head CT scan revealed no acute intracranial hemorrhage or abnormal mass effect.  It showed suspected subacute left cerebellar infarct with recommendation for MRI.  The patient will be admitted to an observation medical telemetry bed for further evaluation and management. PAST MEDICAL HISTORY:   Past Medical History:  Diagnosis Date    Basal cell carcinoma 06/22/2022   L medial post shoulder, [facility]   Basal cell carcinoma 06/22/2022   L spinal mid back, [facility]   Basal cell carcinoma 06/22/2022   L spinal upper back, [facility]   Basal cell carcinoma 06/22/2022   R nasal ala, MOHs completed 08/09/22   Basal cell carcinoma 06/22/2022   R upper back, [facility]   Depression    Diabetes mellitus without complication (HCC)     PAST SURGICAL HISTORY:   Past Surgical History:  Procedure Laterality Date   INGUINAL HERNIA REPAIR     LUMBAR FUSION      SOCIAL HISTORY:   Social History   Tobacco Use   Smoking status: Every Day    Packs/day: .5    Types: Cigarettes   Smokeless tobacco: Never  Substance Use Topics   Alcohol use: No    Comment: denies use    FAMILY HISTORY:   Family History  Problem Relation Age of Onset   Cancer Mother    Basal cell carcinoma Sister    Squamous cell carcinoma Sister     DRUG ALLERGIES:  No Known Allergies  REVIEW OF SYSTEMS:   ROS As per history of present illness. All pertinent systems were reviewed above. Constitutional, HEENT, cardiovascular, respiratory, GI, GU, musculoskeletal, neuro, psychiatric, endocrine, integumentary and hematologic systems were reviewed and are otherwise negative/unremarkable except for positive findings mentioned above in the HPI.   MEDICATIONS AT HOME:   Prior to Admission medications   Medication Sig  Start Date End Date Taking? Authorizing Provider  traZODone (DESYREL) 100 MG tablet Take 1 tablet (100 mg total) by mouth at bedtime as needed for sleep (Insomnia). 07/09/21   Sarina Ill, DO  venlafaxine XR (EFFEXOR-XR) 150 MG 24 hr capsule Take 1 capsule (150 mg total) by mouth daily with breakfast. 07/10/21   Sarina Ill, DO      VITAL SIGNS:  Blood pressure (!) 166/87, pulse 90, temperature 98.8 F (37.1 C), temperature source Oral, resp. rate 20, height 5\' 10"  (1.778 m), weight 74.8 kg, SpO2 96 %.  PHYSICAL EXAMINATION:   Physical Exam  GENERAL:  57 y.o.-year-old Caucasian male patient lying in the bed with no acute distress.  He was mildly somnolent but arousable.  EYES: Pupils equal, round, reactive to light and accommodation. No scleral icterus. Extraocular muscles intact.  HEENT: Head atraumatic, normocephalic. Oropharynx and nasopharynx clear.  NECK:  Supple, no jugular venous distention. No thyroid enlargement, no tenderness.  LUNGS: Normal breath sounds bilaterally, no wheezing, rales,rhonchi or crepitation. No use of accessory muscles of respiration.  CARDIOVASCULAR: Regular rate and rhythm, S1, S2 normal. No murmurs, rubs, or gallops.  ABDOMEN: Soft, nondistended, nontender. Bowel sounds present. No organomegaly or mass.  EXTREMITIES: No pedal edema, cyanosis, or clubbing.  NEUROLOGIC: Cranial nerves II through XII are intact. Muscle strength 5/5 in all extremities.  He has occasional jerking of both upper extremities earlier but not currently.  Sensation intact. Gait was steady earlier. PSYCHIATRIC: The patient is alert and oriented x 3.  Normal affect and good eye contact. SKIN: No obvious rash, lesion, or ulcer.   LABORATORY PANEL:   CBC Recent Labs  Lab 01/07/23 0100  WBC 6.8  HGB 14.1  HCT 41.6  PLT 293   ------------------------------------------------------------------------------------------------------------------  Chemistries  Recent Labs  Lab 01/07/23 0100  NA 134*  K 3.9  CL 102  CO2 24  GLUCOSE 329*  BUN 15  CREATININE 0.92  CALCIUM 9.3  AST 27  ALT 22  ALKPHOS 71  BILITOT 0.7   ------------------------------------------------------------------------------------------------------------------  Cardiac Enzymes No results for input(s): "TROPONINI" in the last 168 hours. ------------------------------------------------------------------------------------------------------------------  RADIOLOGY:  MR BRAIN WO CONTRAST  Result Date: 01/07/2023 CLINICAL DATA:   57 year old male with involuntary movements, abnormal jerking for the past 2-3 weeks. Recently diagnosed with oral cancer. History of diabetes. Patient reports that for about the past 2 weeks he has been dealing with intermittent jerking movements, particularly in both of his shoulders and head. Serum glucose 329. Questionable cerebellar infarct on motion degraded head CT. EXAM: MRI HEAD WITHOUT CONTRAST TECHNIQUE: Multiplanar, multiecho pulse sequences of the brain and surrounding structures were obtained without intravenous contrast. COMPARISON:  Noncontrast head CT 0125 hours today. FINDINGS: Brain: Intermittently motion degraded despite repeated imaging attempts. No convincing abnormal diffusion, evidence of acute infarction. No midline shift, mass effect, evidence of mass lesion, ventriculomegaly, extra-axial collection or acute intracranial hemorrhage. Cervicomedullary junction and pituitary are within normal limits. Motion degraded axial FLAIR imaging reveals scattered, patchy areas of abnormal hyperintensity in the right hemisphere, cortical and subcortical (series 11 images 24 through 34). No mass effect in these areas. And perhaps mild ex vacuo enlargement of the nearby right lateral ventricle atrium. On correlation with T1 and T2 imaging these appear most compatible with encephalomalacia. These affect the posterior right MCA and MCA/PCA watershed areas. No similar signal abnormality in the left hemisphere. Signal in the bilateral deep gray nuclei remains normal on all sequences. Brainstem and cerebellum appear normal on  all sequences. Vascular: Asymmetric loss of the right ICA flow void at the skull base and through the siphon (series 14, image 8), but evidence that flow is reconstituted at the right ICA terminus. Other Major intracranial vascular flow voids are preserved. Skull and upper cervical spine: Negative for age visible cervical spine. Visualized bone marrow signal is within normal limits.  Sinuses/Orbits: Orbits appear symmetric and negative. Mild paranasal sinus mucosal thickening on the right, significance doubtful. Other: Mastoids are well aerated. Visible internal auditory structures appear normal. Negative visible scalp and face. Visible upper cervical lymph nodes are nonenlarged. IMPRESSION: 1. Mildly motion degraded exam. No evidence of acute infarct, but evidence of Right ICA POOR FLOW versus OCCLUSION, and scattered areas of right hemisphere encephalomalacia indicative of previous infarcts. CTA head and neck would best evaluate the Right ICA. 2. Elsewhere negative noncontrast MRI appearance of the Brain. Bilateral deep gray nuclei, brainstem and cerebellum appear normal. But note that early metastatic disease to the brain is difficult to exclude in the absence of intravenous contrast. Electronically Signed   By: Odessa Fleming M.D.   On: 01/07/2023 05:31   CT HEAD WO CONTRAST ( )  Addendum Date: 01/07/2023   ADDENDUM REPORT: 01/07/2023 01:49 ADDENDUM: These results were called by telephone at the time of interpretation on 01/07/2023 at 1:49 am to provider JADE SUNG , who verbally acknowledged these results. Electronically Signed   By: Helyn Numbers M.D.   On: 01/07/2023 01:49   Result Date: 01/07/2023 CLINICAL DATA:  Involuntary movement EXAM: CT HEAD WITHOUT CONTRAST TECHNIQUE: Contiguous axial images were obtained from the base of the skull through the vertex without intravenous contrast. RADIATION DOSE REDUCTION: This exam was performed according to the departmental dose-optimization program which includes automated exposure control, adjustment of the mA and/or kV according to patient size and/or use of iterative reconstruction technique. COMPARISON:  None Available. FINDINGS: Brain: The examination is significantly limited by motion artifact. No acute intracranial hemorrhage. No abnormal mass effect or midline shift. No abnormal intra or extra-axial mass lesion or fluid collection.  Ventricular size is normal. Hypoattenuation of the left cerebellar hemisphere without associated volume loss suggests a subacute infarct. Vascular: No hyperdense vessel or unexpected calcification. Skull: Normal. Negative for fracture or focal lesion. Sinuses/Orbits: No acute finding. Other: Mastoid air cells and middle ear cavities are clear. IMPRESSION: 1. The examination is significantly limited by motion artifact. No acute intracranial hemorrhage or abnormal mass effect. 2. Suspected subacute left cerebellar infarct. MRI examination is recommended for further evaluation, though sedation may be necessary to avoid significant motion artifact. Electronically Signed: By: Helyn Numbers M.D. On: 01/07/2023 01:44      IMPRESSION AND PLAN:  Assessment and Plan: * CVA (cerebral vascular accident) (HCC) - This is likely subacute left cerebellar infarction.  This is manifested by ataxia and abnormal movements. - He will be admitted to a medical telemetry observation bed. - A brain MRI without contrast is currently pending. - We will follow neuro checks q.4 hours for 24 hours.   - The patient will be placed on aspirin.   - Will obtain a head and neck CTA  and 2D echo with bubble study .   - A neurology consultation  as well as physical/occupation/speech therapy consults will be obtained in a.m.Marland Kitchen - I notified Dr. Wilford Corner about the patient - The patient will be placed on statin therapy and fasting lipids will be checked.   Depression - We will continue Effexor XR and trazodone.  Type  2 diabetes mellitus without complications (HCC) - The patient will be placed on supplemental coverage with NovoLog.   DVT prophylaxis: Lovenox. Advanced Care Planning:  Code Status: full code. Family Communication:  The plan of care was discussed in details with the patient (and family). I answered all questions. The patient agreed to proceed with the above mentioned plan. Further management will depend upon hospital  course. Disposition Plan: Back to previous home environment Consults called: Neurology All the records are reviewed and case discussed with ED provider.  Status is: Observation  I certify that at the time of admission, it is my clinical judgment that the patient will require hospital care extending less than 2 midnights.                            Dispo: The patient is from: Home              Anticipated d/c is to: Home              Patient currently is not medically stable to d/c.              Difficult to place patient: No  Hannah Beat M.D on 01/07/2023 at 5:44 AM  [facility]   From 7 PM-7 AM, contact night-coverage [email or web address]  CC: Primary care physician; Dortha Kern, MD

## 2023-01-07 NOTE — ED Provider Notes (Signed)
[facility] Provider Note    Event Date/Time   First MD Initiated Contact with Patient 01/07/23 0221     (approximate)   History   Chief Complaint Abnormal Movements   HPI  Edgar White is a 57 y.o. male with past medical history of diabetes and polysubstance use who presents to the ED complaining of abnormal movements.  Patient reports that for about the past 2 weeks he has been dealing with intermittent jerking movements, particularly and both of his shoulders and head.  He states that the movements do not seem to be exacerbated or alleviated by anything in particular, denies any vision changes, or speech changes.  He does report dealing with some numbness and weakness in his left arm 2 days ago that has since resolved.  He is also felt very unsteady on his feet with poor coordination and feeling like his legs are going to give out on him.  He admits to using methamphetamines 3 times since February, but denies any correlation with his symptoms.  He denies any other drug use other than marijuana and does not drink alcohol.  He has not had any fevers, cough, chest pain, shortness of breath, nausea, vomiting, or diarrhea.  He had his Effexor dose increased in December, but denies taking any other medications.     Physical Exam   Triage Vital Signs: ED Triage Vitals  Enc Vitals Group     BP 01/07/23 0053 (!) 165/87     Pulse Rate 01/07/23 0053 (!) 101     Resp 01/07/23 0053 17     Temp 01/07/23 0053 98.8 F (37.1 C)     Temp Source 01/07/23 0053 Oral     SpO2 01/07/23 0053 97 %     Weight 01/07/23 0052 165 lb (74.8 kg)     Height 01/07/23 0052 5\' 10"  (1.778 m)     Head Circumference --      Peak Flow --      Pain Score --      Pain Loc --      Pain Edu? --      Excl. in GC? --     Most recent vital signs: Vitals:   01/07/23 0053 01/07/23 0230  BP: (!) 165/87 (!) 166/87  Pulse: (!) 101 90  Resp: 17 20  Temp: 98.8 F (37.1 C)   SpO2: 97%  96%    Constitutional: Alert and oriented. Eyes: Conjunctivae are normal. Head: Atraumatic. Nose: No congestion/rhinnorhea. Mouth/Throat: Mucous membranes are moist.  Cardiovascular: Normal rate, regular rhythm. Grossly normal heart sounds.  2+ radial pulses bilaterally. Respiratory: Normal respiratory effort.  No retractions. Lungs CTAB. Gastrointestinal: Soft and nontender. No distention. Musculoskeletal: No lower extremity tenderness nor edema.  Neurologic:  Normal speech and language. No gross focal neurologic deficits are appreciated.  Occasional jerking movements of upper extremities noted.  No dysmetria noted with finger-nose or heel-to-shin testing.  Unsteady gait noted with ambulation.    ED Results / Procedures / Treatments   Labs (all labs ordered are listed, but only abnormal results are displayed) Labs Reviewed  COMPREHENSIVE METABOLIC PANEL - Abnormal; Notable for the following components:      Result Value   Sodium 134 (*)    Glucose, Bld 329 (*)    All other components within normal limits  URINE DRUG SCREEN, QUALITATIVE ([facility] ONLY) - Abnormal; Notable for the following components:   Cannabinoid 50 Ng, Ur [place] POSITIVE (*)    All other  components within normal limits  CBC WITH DIFFERENTIAL/PLATELET  ETHANOL  PROTIME-INR  APTT     EKG  ED ECG REPORT I, Chesley Noon, the attending physician, personally viewed and interpreted this ECG.   Date: 01/07/2023  EKG Time: 2:33  Rate: 79  Rhythm: normal sinus rhythm  Axis: Normal  Intervals:none  ST&T Change: None  RADIOLOGY CT head reviewed and interpreted by me with no hemorrhage or midline shift, possible cerebellar infarct noted.  PROCEDURES:  Critical Care performed: No  Procedures   MEDICATIONS ORDERED IN ED: Medications  LORazepam (ATIVAN) injection 1 mg (has no administration in time range)     IMPRESSION / MDM / ASSESSMENT AND PLAN / ED COURSE  I reviewed the triage vital signs and the  nursing notes.                              57 y.o. male with past medical history of diabetes and polysubstance abuse who presents to the ED complaining of 2 weeks of abnormal jerking movements in his upper extremities with poor coordination and unsteady gait.  Patient's presentation is most consistent with acute presentation with potential threat to life or bodily function.  Differential diagnosis includes, but is not limited to, stroke, TIA, medication effect, serotonin syndrome, withdrawal, substance abuse.  Patient nontoxic-appearing and in no acute distress, vital signs are unremarkable.  He has abnormal jerking movements of his upper extremities occasionally that do not appear consistent with a typical tremor.  No clonus noted and he is not taking any medication that would precipitate serotonin syndrome.  He does appear to have an unsteady gait and reports poor coordination, CT imaging of head limited due to motion artifact but shows potential cerebellar infarct.  This may explain some of his symptoms and we will further assess with MRI, treat symptomatically with IV Ativan.  Labs remarkable for hyperglycemia but otherwise reassuring with no significant electrolyte abnormality, AKI, anemia, or leukocytosis.  Coags are also unremarkable.  Patient would benefit from admission for further stroke workup and neurology evaluation, case discussed with hospitalist.      FINAL CLINICAL IMPRESSION(S) / ED DIAGNOSES   Final diagnoses:  Abnormal movements  Cerebellar stroke (HCC)     Rx / DC Orders   ED Discharge Orders     None        Note:  This document was prepared using Dragon voice recognition software and may include unintentional dictation errors.   Chesley Noon, MD 01/07/23 [phone]

## 2023-01-08 DIAGNOSIS — R2990 Unspecified symptoms and signs involving the nervous system: Secondary | ICD-10-CM | POA: Diagnosis not present

## 2023-01-08 LAB — GLUCOSE, CAPILLARY
Glucose-Capillary: 244 mg/dL — ABNORMAL HIGH (ref 70–99)
Glucose-Capillary: 304 mg/dL — ABNORMAL HIGH (ref 70–99)

## 2023-01-08 LAB — LIPID PANEL
Cholesterol: 236 mg/dL — ABNORMAL HIGH (ref 0–200)
HDL: 35 mg/dL — ABNORMAL LOW (ref >40)
LDL Cholesterol: 161 mg/dL — ABNORMAL HIGH (ref 0–99)
Total CHOL/HDL Ratio: 6.7 RATIO
Triglycerides: 198 mg/dL — ABNORMAL HIGH (ref <150)
VLDL: 40 mg/dL (ref 0–40)

## 2023-01-08 MED ORDER — CLONAZEPAM 0.5 MG PO TABS: 0.5000 mg | ORAL_TABLET | Freq: Two times a day (BID) | ORAL | 0 refills | Status: DC

## 2023-01-08 MED ORDER — IRBESARTAN 150 MG PO TABS
75.0000 mg | ORAL_TABLET | Freq: Every day | ORAL | Status: DC
  Administered 2023-01-08: 75 mg via ORAL
  Filled 2023-01-08: qty 1

## 2023-01-08 MED ORDER — IRBESARTAN 75 MG PO TABS: 75.0000 mg | ORAL_TABLET | Freq: Every day | ORAL | 2 refills | Status: DC

## 2023-01-08 MED ORDER — ATORVASTATIN CALCIUM 20 MG PO TABS: 20.0000 mg | ORAL_TABLET | Freq: Every day | ORAL | 2 refills | Status: DC

## 2023-01-08 NOTE — Discharge Summary (Signed)
Physician Discharge Summary   Patient: Edgar White MRN: [id number] DOB: 11-15-1965  Admit date:     01/07/2023  Discharge date: 01/08/2023  Discharge Physician: Pennie Banter   PCP: Dortha Kern, MD   Recommendations at discharge:   Follow up with Primary Care Follow up with Psychiatry Follow up on BP control.  Started on irbesartan. Follow up on Diabetes, A1c is 9.7%. Pt declined to start medication at this time.  Recommend close follow up. Follow up on hyperlipidemia and tolerance for Lipitor Recommend referral to psychologist for therapy to help cope with anxiety, depression and life stressors, in addition to medications  Discharge Diagnoses: Principal Problem:   Stroke-like symptoms Active Problems:   Type 2 diabetes mellitus without complications (HCC)   Depression   Ataxia  Resolved Problems:   * No resolved hospital problems. [facility] Course:  HPI on admission 5/18 by Dr. Arville Care: "Edgar White is a 57 y.o. Caucasian male with medical history significant for type diabetes mellitus and depression, who presented to the emergency room with acute onset of abnormal movements with intermittent jerking of both shoulders and had with some numbness and weakness in the left arm 2 weeks ago with resolved.  The patient has been feeling unsteady on his feet with poor coordination and felt like his legs are going to give out on him.  He used methamphetamine 3 times since February but denied any correlation to his symptoms.  No history of alcohol abuse or tobacco abuse but admitted to marijuana abuse.  No dysphagia or dysarthria.  No fever or chills.  No chest pain or palpitations.  No cough or wheezing or hemoptysis.  No other bleeding diathesis.  No urinary or stool incontinence.   ED Course: When the patient came to the ER, BP was 165/87 with heart rate of 101 and otherwise vital signs were within normal and later respiratory rate was 21.  Labs revealed mild hyponatremia  of 134 and 6 hyperglycemia of 329 with otherwise unremarkable CMP and CBC.  Coag profile was within normal.  Urine drug screen was positive for cannabinoids. EKG as reviewed by me : Pending. Imaging: Noncontrast head CT scan revealed no acute intracranial hemorrhage or abnormal mass effect.  It showed suspected subacute left cerebellar infarct with recommendation for MRI.... "   Stroke was ruled out on initial MRI brain. Subsequently obtained MRI brain with contrast and MRI cervical spine.  These were unremarkable for findings to explain his symptoms.  C-spine with multi-level spondylosis.  Patient was seen and evaluated by Neurology.   Started on scheduled Klonopin with improvement in his jerking movements and steadiness with ambulation. Symptoms felt very likely to be pscyhogenic in nature, given his severe underlying depression/anxiety and recent significant stress with oral cancer diagnosis among other things.   5/19 -- Pt improved, no further testing indicated.  Stable and agreeable to discharge home.   He is recommended and agreeable to close follow up with his psychiatrist.   Assessment and Plan: * Stroke-like symptoms Functional neurologic disorder with abnormal movements Seen by Neurology. Stroke and metastatic disease, cervical spine etiology all ruled out. Symptoms improved with Klonopin recommended by Neurology. Close psychiatry follow up Recommend psychologist for talk therapy as additional measure for coping with stress and mental health challenges.  Depression Generalized Anxiety Life stressors Continue Effexor XR  No longer taking trazodone & would avoid in combination with Effexor due to higher risk on serotonin syndrome when combined. Started on Klonopin  0.5 mg BID by Neurology for psychogenic myoclonic jerking symptoms - improved Follow up with Psychiatry  Type 2 diabetes mellitus without complications (HCC) - The patient will be placed on supplemental coverage  with NovoLog.         Consultants: Neurology Procedures performed: None  Disposition: Home Diet recommendation:  Cardiac and Carb modified diet DISCHARGE MEDICATION: Allergies as of 01/08/2023   No Known Allergies      Medication List     STOP taking these medications    traZODone 100 MG tablet Commonly known as: DESYREL       TAKE these medications    atorvastatin 20 MG tablet Commonly known as: LIPITOR Take 1 tablet (20 mg total) by mouth daily. Start taking on: Jan 09, 2023   clonazePAM 0.5 MG tablet Commonly known as: KLONOPIN Take 1 tablet (0.5 mg total) by mouth 2 (two) times daily.   irbesartan 75 MG tablet Commonly known as: AVAPRO Take 1 tablet (75 mg total) by mouth daily. Start taking on: Jan 09, 2023   venlafaxine XR 150 MG 24 hr capsule Commonly known as: EFFEXOR-XR Take 1 capsule (150 mg total) by mouth daily with breakfast.        Discharge Exam: Filed Weights   01/07/23 0052  Weight: 74.8 kg   General exam: awake, alert, no acute distress HEENT: atraumatic, clear conjunctiva, anicteric sclera, moist mucus membranes, hearing grossly normal  Respiratory system: CTAB, no wheezes, rales or rhonchi, normal respiratory effort. Cardiovascular system: normal S1/S2, RRR, no JVD, murmurs, rubs, gallops, no pedal edema.   Gastrointestinal system: soft, NT, ND, no HSM felt, +bowel sounds. Central nervous system: A&O x 4. no gross focal neurologic deficits, normal speech Extremities: moves all, no edema, normal tone Skin: dry, intact, normal temperature, normal color, No rashes, lesions or ulcers Psychiatry: normal mood, congruent affect, judgement and insight appear normal   Condition at discharge: stable  The results of significant diagnostics from this hospitalization (including imaging, microbiology, ancillary and laboratory) are listed below for reference.   Imaging Studies: MR BRAIN W CONTRAST  Result Date: 01/07/2023 CLINICAL DATA:   Ataxia, stroke like symptoms.  Oral cancer. EXAM: MRI HEAD WITH CONTRAST TECHNIQUE: Multiplanar, multiecho pulse sequences of the brain and surrounding structures were obtained with intravenous contrast. CONTRAST:  7.55mL GADAVIST GADOBUTROL 1 MMOL/ML IV SOLN COMPARISON:  CT head without contrast 01/07/2023. FINDINGS: Brain: Postcontrast images demonstrate no pathologic enhancement to suggest metastatic disease to the brain. Vascular: Contrast is present right internal carotid artery consistent with chronically occluded right ICA. Skull and upper cervical spine: The craniocervical junction is normal. Upper cervical spine is within normal limits. Marrow signal is unremarkable. Sinuses/Orbits: The paranasal sinuses and mastoid air cells are clear. The globes and orbits are within normal limits. IMPRESSION: 1. No evidence for metastatic disease to the brain. 2. Chronically occluded right ICA. Electronically Signed   By: Marin Roberts M.D.   On: 01/07/2023 14:25   MR CERVICAL SPINE W WO CONTRAST  Result Date: 01/07/2023 CLINICAL DATA:  Ataxia, nontraumatic, cervical pathology suspected EXAM: MRI CERVICAL SPINE WITHOUT AND WITH CONTRAST TECHNIQUE: Multiplanar and multiecho pulse sequences of the cervical spine, to include the craniocervical junction and cervicothoracic junction, were obtained without and with intravenous contrast. CONTRAST:  7.33mL GADAVIST GADOBUTROL 1 MMOL/ML IV SOLN COMPARISON:  Same day CTA neck FINDINGS: Technical Note: Despite efforts by the technologist and patient, motion artifact is present on today's exam and could not be eliminated. This reduces exam sensitivity and  specificity. Alignment: Straightening of the cervical lordosis. No significant listhesis. Vertebrae: No fracture, evidence of discitis, or bone lesion. Chronic discogenic endplate changes at C6-7. Cord: No definite cervical cord signal abnormality, evaluation degraded by motion artifact. No evidence of abnormal  postcontrast enhancement. Posterior Fossa, vertebral arteries, paraspinal tissues: Negative. Disc levels: C2-C3: Minimal disc bulge.  No foraminal or canal stenosis. C3-C4: Shallow central disc protrusion. Right greater than left uncovertebral spurring. Mild-moderate right and mild left foraminal stenosis. No canal stenosis. C4-C5: Disc osteophyte complex with mild facet and uncovertebral arthropathy. Moderate right and probable mild left foraminal stenosis. No canal stenosis. C5-C6: Disc osteophyte complex with bilateral facet and uncovertebral arthropathy. Severe left and moderate-severe right foraminal stenosis. No canal stenosis. C6-C7: Disc osteophyte complex with bilateral uncovertebral spurring. Severe left and mild-moderate right foraminal stenosis. No canal stenosis. C7-T1: No significant disc protrusion, foraminal stenosis, or canal stenosis. IMPRESSION: 1. Motion degraded examination. 2. Multilevel cervical spondylosis with multilevel bilateral foraminal stenosis, most severe on the left at C5-6 and C6-7. 3. No canal stenosis at any level. Electronically Signed   By: Duanne Guess D.O.   On: 01/07/2023 14:19   ECHOCARDIOGRAM COMPLETE BUBBLE STUDY  Result Date: 01/07/2023    ECHOCARDIOGRAM REPORT   Patient Name:   MIC BUTSCH [facility] Date of Exam: 01/07/2023 Medical Rec #:  [id number]         Height:       70.0 in Accession #:    [id number]        Weight:       165.0 lb Date of Birth:  09/07/1965         BSA:          1.923 m Patient Age:    57 years          BP:           174/96 mmHg Patient Gender: M                 HR:           82 bpm. Exam Location:  [facility] Procedure: 2D Echo, Cardiac Doppler, Color Doppler and Saline Contrast Bubble            Study Indications:    I63.9 Stroke  History:        Patient has no prior history of Echocardiogram examinations.                 Polysubstance abuse and Stroke; Risk Factors:Current Smoker.  Sonographer:    Dondra Prader RVT RCS Referring Phys: [id number] JAN A  MANSY IMPRESSIONS  1. Left ventricular ejection fraction, by estimation, is 60 to 65%. The left ventricle has normal function. The left ventricle has no regional wall motion abnormalities. There is mild concentric left ventricular hypertrophy. Left ventricular diastolic parameters are consistent with Grade I diastolic dysfunction (impaired relaxation).  2. Right ventricular systolic function is normal. The right ventricular size is normal. There is normal pulmonary artery systolic pressure.  3. The mitral valve is normal in structure. Mild mitral valve regurgitation. No evidence of mitral stenosis.  4. The aortic valve is tricuspid. Aortic valve regurgitation is not visualized. No aortic stenosis is present.  5. The inferior vena cava is normal in size with greater than 50% respiratory variability, suggesting right atrial pressure of 3 mmHg. FINDINGS  Left Ventricle: Left ventricular ejection fraction, by estimation, is 60 to 65%. The left ventricle has normal function. The left ventricle has no regional wall motion  abnormalities. The left ventricular internal cavity size was normal in size. There is  mild concentric left ventricular hypertrophy. Left ventricular diastolic parameters are consistent with Grade I diastolic dysfunction (impaired relaxation). Normal left ventricular filling pressure. Right Ventricle: The right ventricular size is normal. No increase in right ventricular wall thickness. Right ventricular systolic function is normal. There is normal pulmonary artery systolic pressure. The tricuspid regurgitant velocity is 1.16 m/s, and  with an assumed right atrial pressure of 3 mmHg, the estimated right ventricular systolic pressure is 8.4 mmHg. Left Atrium: Left atrial size was normal in size. Right Atrium: Right atrial size was normal in size. Pericardium: There is no evidence of pericardial effusion. Mitral Valve: The mitral valve is normal in structure. Mild mitral valve regurgitation. No evidence of  mitral valve stenosis. Tricuspid Valve: The tricuspid valve is normal in structure. Tricuspid valve regurgitation is trivial. No evidence of tricuspid stenosis. Aortic Valve: The aortic valve is tricuspid. Aortic valve regurgitation is not visualized. No aortic stenosis is present. Aortic valve mean gradient measures 4.0 mmHg. Aortic valve peak gradient measures 7.4 mmHg. Aortic valve area, by VTI measures 2.91 cm. Pulmonic Valve: The pulmonic valve was normal in structure. Pulmonic valve regurgitation is not visualized. No evidence of pulmonic stenosis. Aorta: The aortic root is normal in size and structure. Venous: The inferior vena cava is normal in size with greater than 50% respiratory variability, suggesting right atrial pressure of 3 mmHg. IAS/Shunts: No atrial level shunt detected by color flow Doppler. Agitated saline contrast was given intravenously to evaluate for intracardiac shunting.  LEFT VENTRICLE PLAX 2D LVIDd:         4.10 cm   Diastology LVIDs:         2.30 cm   LV e' medial:    8.70 cm/s LV PW:         1.10 cm   LV E/e' medial:  9.2 LV IVS:        1.30 cm   LV e' lateral:   10.80 cm/s LVOT diam:     2.10 cm   LV E/e' lateral: 7.4 LV SV:         69 LV SV Index:   36 LVOT Area:     3.46 cm  RIGHT VENTRICLE RV Basal diam:  2.50 cm RV Mid diam:    2.40 cm RV S prime:     16.50 cm/s TAPSE (M-mode): 2.5 cm LEFT ATRIUM             Index        RIGHT ATRIUM           Index LA diam:        3.40 cm 1.77 cm/m   RA Area:     12.20 cm LA Vol (A2C):   45.5 ml 23.66 ml/m  RA Volume:   26.30 ml  13.67 ml/m LA Vol (A4C):   30.2 ml 15.67 ml/m LA Biplane Vol: 44.8 ml 23.29 ml/m  AORTIC VALVE                    PULMONIC VALVE AV Area (Vmax):    2.80 cm     PV Vmax:       1.00 m/s AV Area (Vmean):   2.58 cm     PV Peak grad:  4.0 mmHg AV Area (VTI):     2.91 cm AV Vmax:           136.00 cm/s AV Vmean:  93.000 cm/s AV VTI:            0.237 m AV Peak Grad:      7.4 mmHg AV Mean Grad:      4.0 mmHg  LVOT Vmax:         110.00 cm/s LVOT Vmean:        69.200 cm/s LVOT VTI:          0.199 m LVOT/AV VTI ratio: 0.84  AORTA Ao Root diam: 3.00 cm Ao Asc diam:  2.90 cm MITRAL VALVE               TRICUSPID VALVE MV Area (PHT): 3.17 cm    TR Peak grad:   5.4 mmHg MV Decel Time: 239 msec    TR Vmax:        116.00 cm/s MV E velocity: 79.90 cm/s MV A velocity: 87.80 cm/s  SHUNTS MV E/A ratio:  0.91        Systemic VTI:  0.20 m                            Systemic Diam: 2.10 cm Chilton Si MD Electronically signed by Chilton Si MD Signature Date/Time: 01/07/2023/1:43:47 PM    Final    CT ANGIO HEAD NECK W WO CM  Result Date: 01/07/2023 CLINICAL DATA:  57 year old male with involuntary movements, abnormal jerking for the past 2-3 weeks. Abnormal right ICA flow on noncontrast brain MRI and evidence of previous right hemisphere infarcts. Recently diagnosed with oral cancer. EXAM: CT ANGIOGRAPHY HEAD AND NECK WITH AND WITHOUT CONTRAST TECHNIQUE: Multidetector CT imaging of the head and neck was performed using the standard protocol during bolus administration of intravenous contrast. Multiplanar CT image reconstructions and MIPs were obtained to evaluate the vascular anatomy. Carotid stenosis measurements (when applicable) are obtained utilizing NASCET criteria, using the distal internal carotid diameter as the denominator. RADIATION DOSE REDUCTION: This exam was performed according to the departmental dose-optimization program which includes automated exposure control, adjustment of the mA and/or kV according to patient size and/or use of iterative reconstruction technique. CONTRAST:  75mL OMNIPAQUE IOHEXOL 350 MG/ML SOLN COMPARISON:  Brain MRI today 0436 hours. FINDINGS: CTA NECK Skeleton: Absent dentition. Ordinary cervical spine degeneration. No acute or suspicious osseous lesion identified. Upper chest: Negative. Other neck: Thyroid, larynx, pharynx, parapharyngeal spaces, retropharyngeal space, sublingual  space, submandibular spaces, masticator and parotid spaces are within normal limits. No discrete oral tongue or oral cavity lesion is evident. Aortic arch: Normal 3 vessel arch.  No significant atherosclerosis. Right carotid system: Brachiocephalic artery soft plaque is mild without stenosis. Normal right CCA origin. Right CCA is within normal limits through the level of the larynx. But just before the bifurcation there is moderate soft and calcified plaque. This continues into and occludes the right ICA origin (series 4, image 114 and series 8, image 69. No reconstituted enhancement to the skull base. Left carotid system: Minimal left CCA plaque before the bifurcation. Mild calcified plaque, soft plaque at the left ICA origin and bulb but less than 50 % stenosis with respect to the distal vessel. Vertebral arteries: Proximal right subclavian artery and right vertebral artery origin are normal. Right vertebral is patent, mildly tortuous to the skull base with no significant plaque or stenosis. Proximal left subclavian artery and left vertebral artery origin are normal. Left vertebral artery is mildly non dominant but patent and within normal limits to the skull base. CTA HEAD  Posterior circulation: Fairly codominant distal vertebral arteries. Normal PICA origins. Normal vertebrobasilar junction without stenosis. Patent basilar artery without stenosis. Patent SCA and PCA origins. Small left and larger right posterior communicating artery (series 6, image 101). Bilateral PCA branches are within normal limits. Anterior circulation: Right ICA siphon remains occluded until the right posterior communicating artery on series 6, image 103. Above that level the distal right ICA and right ICA terminus are enhancing normally. Contralateral left ICA siphon is patent with minimal plaque and no stenosis. Both posterior communicating artery origins are normal. Patent carotid termini, MCA and ACA origins. Left A1 appears dominant.  Anterior communicating artery appears mildly fenestrated (normal variant). Bilateral ACA branches are within normal limits. Left MCA M1 segment and bifurcation are patent without stenosis. Right MCA M1 segment and bifurcation are patent, and appear fairly similar to the opposite side. Left MCA branches are within normal limits. Right MCA branches by comparison are mildly irregular and attenuated on series 11, image 13. No discrete branch occlusion. Venous sinuses: Early contrast timing, grossly patent. Anatomic variants: Mildly dominant left ACA A1. Slightly dominant right vertebral artery. Review of the MIP images confirms the above findings IMPRESSION: 1. 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 No reconstitution in the neck. Reconstituted Right ICA terminus via the Right Posterior Communicating Artery. Mildly irregular Right MCA branches compatible with previous infarcts in that territory. 2. No evidence of emergent large vessel occlusion. And only mild additional atherosclerosis in the head and neck. No other significant arterial stenosis. 3. No malignant neck mass or lymphadenopathy identified by CTA (conventional neck CT would be more sensitive. Electronically Signed   By: Odessa Fleming M.D.   On: 01/07/2023 06:10   MR BRAIN WO CONTRAST  Result Date: 01/07/2023 CLINICAL DATA:  57 year old male with involuntary movements, abnormal jerking for the past 2-3 weeks. Recently diagnosed with oral cancer. History of diabetes. Patient reports that for about the past 2 weeks he has been dealing with intermittent jerking movements, particularly in both of his shoulders and head. Serum glucose 329. Questionable cerebellar infarct on motion degraded head CT. EXAM: MRI HEAD WITHOUT CONTRAST TECHNIQUE: Multiplanar, multiecho pulse sequences of the brain and surrounding structures were obtained without intravenous contrast. COMPARISON:  Noncontrast  head CT 0125 hours today. FINDINGS: Brain: Intermittently motion degraded despite repeated imaging attempts. No convincing abnormal diffusion, evidence of acute infarction. No midline shift, mass effect, evidence of mass lesion, ventriculomegaly, extra-axial collection or acute intracranial hemorrhage. Cervicomedullary junction and pituitary are within normal limits. Motion degraded axial FLAIR imaging reveals scattered, patchy areas of abnormal hyperintensity in the right hemisphere, cortical and subcortical (series 11 images 24 through 34). No mass effect in these areas. And perhaps mild ex vacuo enlargement of the nearby right lateral ventricle atrium. On correlation with T1 and T2 imaging these appear most compatible with encephalomalacia. These affect the posterior right MCA and MCA/PCA watershed areas. No similar signal abnormality in the left hemisphere. Signal in the bilateral deep gray nuclei remains normal on all sequences. Brainstem and cerebellum appear normal on all sequences. Vascular: Asymmetric loss of the right ICA flow void at the skull base and through the siphon (series 14, image 8), but evidence that flow is reconstituted at the right ICA terminus. Other Major intracranial vascular flow voids are preserved. Skull and upper cervical spine: Negative for age visible cervical spine. Visualized bone marrow signal is within normal limits. Sinuses/Orbits: Orbits appear  symmetric and negative. Mild paranasal sinus mucosal thickening on the right, significance doubtful. Other: Mastoids are well aerated. Visible internal auditory structures appear normal. Negative visible scalp and face. Visible upper cervical lymph nodes are nonenlarged. IMPRESSION: 1. Mildly motion degraded exam. No evidence of acute infarct, but evidence of Right ICA POOR FLOW versus OCCLUSION, and scattered areas of right hemisphere encephalomalacia indicative of previous infarcts. CTA head and neck would best evaluate the Right ICA.  2. Elsewhere negative noncontrast MRI appearance of the Brain. Bilateral deep gray nuclei, brainstem and cerebellum appear normal. But note that early metastatic disease to the brain is difficult to exclude in the absence of intravenous contrast. Electronically Signed   By: Odessa Fleming M.D.   On: 01/07/2023 05:31   CT HEAD WO CONTRAST ( )  Addendum Date: 01/07/2023   ADDENDUM REPORT: 01/07/2023 01:49 ADDENDUM: These results were called by telephone at the time of interpretation on 01/07/2023 at 1:49 am to provider JADE SUNG , who verbally acknowledged these results. Electronically Signed   By: Helyn Numbers M.D.   On: 01/07/2023 01:49   Result Date: 01/07/2023 CLINICAL DATA:  Involuntary movement EXAM: CT HEAD WITHOUT CONTRAST TECHNIQUE: Contiguous axial images were obtained from the base of the skull through the vertex without intravenous contrast. RADIATION DOSE REDUCTION: This exam was performed according to the departmental dose-optimization program which includes automated exposure control, adjustment of the mA and/or kV according to patient size and/or use of iterative reconstruction technique. COMPARISON:  None Available. FINDINGS: Brain: The examination is significantly limited by motion artifact. No acute intracranial hemorrhage. No abnormal mass effect or midline shift. No abnormal intra or extra-axial mass lesion or fluid collection. Ventricular size is normal. Hypoattenuation of the left cerebellar hemisphere without associated volume loss suggests a subacute infarct. Vascular: No hyperdense vessel or unexpected calcification. Skull: Normal. Negative for fracture or focal lesion. Sinuses/Orbits: No acute finding. Other: Mastoid air cells and middle ear cavities are clear. IMPRESSION: 1. The examination is significantly limited by motion artifact. No acute intracranial hemorrhage or abnormal mass effect. 2. Suspected subacute left cerebellar infarct. MRI examination is recommended for further  evaluation, though sedation may be necessary to avoid significant motion artifact. Electronically Signed: By: Helyn Numbers M.D. On: 01/07/2023 01:44    Microbiology: Results for orders placed or performed during the hospital encounter of 06/17/21  Resp Panel by RT-PCR (Flu A&B, Covid) Nasopharyngeal Swab     Status: None   Collection Time: 06/17/21  4:17 PM   Specimen: Nasopharyngeal Swab; Nasopharyngeal(NP) swabs in vial transport medium  Result Value Ref Range Status   SARS Coronavirus 2 by RT PCR NEGATIVE NEGATIVE Final    Comment: (NOTE) SARS-CoV-2 target nucleic acids are NOT DETECTED.  The SARS-CoV-2 RNA is generally detectable in upper respiratory specimens during the acute phase of infection. The lowest concentration of SARS-CoV-2 viral copies this assay can detect is 138 copies/mL. A negative result does not preclude SARS-Cov-2 infection and should not be used as the sole basis for treatment or other patient management decisions. A negative result may occur with  improper specimen collection/handling, submission of specimen other than nasopharyngeal swab, presence of viral mutation(s) within the areas targeted by this assay, and inadequate number of viral copies(<138 copies/mL). A negative result must be combined with clinical observations, patient history, and epidemiological information. The expected result is Negative.  Fact Sheet for Patients:  BloggerCourse.com  Fact Sheet for Healthcare Providers:  SeriousBroker.it  This test is no t yet approved  or cleared by the [place] and  has been authorized for detection and/or diagnosis of SARS-CoV-2 by FDA under an Emergency Use Authorization (EUA). This EUA will remain  in effect (meaning this test can be used) for the duration of the COVID-19 declaration under Section 564(b)(1) of the Act, 21 U.S.C.section 360bbb-3(b)(1), unless the authorization is terminated   or revoked sooner.       Influenza A by PCR NEGATIVE NEGATIVE Final   Influenza B by PCR NEGATIVE NEGATIVE Final    Comment: (NOTE) The Xpert Xpress SARS-CoV-2/FLU/RSV plus assay is intended as an aid in the diagnosis of influenza from Nasopharyngeal swab specimens and should not be used as a sole basis for treatment. Nasal washings and aspirates are unacceptable for Xpert Xpress SARS-CoV-2/FLU/RSV testing.  Fact Sheet for Patients: BloggerCourse.com  Fact Sheet for Healthcare Providers: SeriousBroker.it  This test is not yet approved or cleared by the [place] FDA and has been authorized for detection and/or diagnosis of SARS-CoV-2 by FDA under an Emergency Use Authorization (EUA). This EUA will remain in effect (meaning this test can be used) for the duration of the COVID-19 declaration under Section 564(b)(1) of the Act, 21 U.S.C. section 360bbb-3(b)(1), unless the authorization is terminated or revoked.  Performed at [facility], [address] [place], [place] [address]     Labs: CBC: Recent Labs  Lab 01/07/23 0100  WBC 6.8  NEUTROABS 4.1  HGB 14.1  HCT 41.6  MCV 91.4  PLT 293   Basic Metabolic Panel: Recent Labs  Lab 01/07/23 0100  NA 134*  K 3.9  CL 102  CO2 24  GLUCOSE 329*  BUN 15  CREATININE 0.92  CALCIUM 9.3   Liver Function Tests: Recent Labs  Lab 01/07/23 0100  AST 27  ALT 22  ALKPHOS 71  BILITOT 0.7  PROT 6.8  ALBUMIN 3.8   CBG: Recent Labs  Lab 01/07/23 1131 01/07/23 1704 01/07/23 2152 01/08/23 0810 01/08/23 1142  GLUCAP 216* 254* 208* 244* 304*    Discharge time spent: less than 30 minutes.  Signed: Pennie Banter, DO [facility] 01/08/2023

## 2023-01-08 NOTE — Progress Notes (Signed)
     [facility] REFERRAL        Occupational Therapy * Physical Therapy * Speech Therapy                           DATE: 01/08/23  PATIENT NAME: Edgar White PATIENT MRN: MRN: [id number]              DIAGNOSIS/DIAGNOSIS CODE: R29.90  DATE OF DISCHARGE: 01/08/23       PRIMARY CARE PHYSICIAN : Joen Laura                 PCP PHONE: [phone]     Dear Provider: [facility]:  Fax: [phone]   I certify that I have examined this patient and that occupational/physical/speech therapy is necessary on an outpatient basis.    The patient has expressed interest in completing their recommended course of therapy at your  location.  Once a formal order from the patient's primary care physician has been obtained, please  contact him/her to schedule an appointment for evaluation at your earliest convenience.   [x ]  Physical Therapy Evaluate and Treat  [  x]  Occupational Therapy Evaluate and Treat  [  ]  Speech Therapy Evaluate and Treat         The patient's primary care physician (listed above) must furnish and be responsible for a formal order such that the recommended services may be furnished while under the primary physician's care, and that the plan of care will be established and reviewed every 30 days (or more often if condition necessitates).

## 2023-01-08 NOTE — Progress Notes (Addendum)
Notified MD Denton Lank and MD Ashish that jerking movements have returned. Ashish states to continue with benzos

## 2023-01-08 NOTE — TOC Progression Note (Signed)
Transition of Care [facility]) - Progression Note    Patient Details  Name: Edgar White MRN: [id number] Date of Birth: October 07, 1965  Transition of Care [facility]) CM/SW Contact  Bing Quarry, RN Phone Number: 01/08/2023, 12:02 PM  Clinical Narrative: 01/08/23: Patient has discharge orders with PT recommendations for outpatient therapy. RN CM spoke to patient and [facility] outpatient therapy and rehab center was chosen. Patient indicated he had transportation to that and was willing/wanting to go post discharge. Outpatient progress/order form place in chart to be signed by provider. Notified provider. Will route via EPIC and fax hard copy to [facility] outpatient center. Gabriel Cirri RN CM [phone] Jacquelyne Balint).            Expected Discharge Plan and Services         Expected Discharge Date: 01/08/23                                     Social Determinants of Health (SDOH) Interventions SDOH Screenings   Food Insecurity: No Food Insecurity (01/07/2023)  Housing: Low Risk  (01/07/2023)  Transportation Needs: No Transportation Needs (01/07/2023)  Utilities: Not At Risk (01/07/2023)  Alcohol Screen: Low Risk  (06/18/2021)  Tobacco Use: High Risk (01/07/2023)    Readmission Risk Interventions     No data to display

## 2023-01-08 NOTE — Plan of Care (Signed)
Started on Klonopin yesterday.  Reports no recurrence of jerking movements.  Reports better control of anxiety. 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 Continue home antidepressant and current dose of Klonopin. Follow-up with psychiatry in the next 2 to 4 weeks. Please call with questions as needed.  -- Milon Dikes, MD Neurologist [facility] Pager: [phone]

## 2023-01-08 NOTE — Progress Notes (Signed)
Mobility Specialist - Progress Note   01/08/23 1042  Mobility  Activity Ambulated with assistance in hallway  Level of Assistance Contact guard assist, steadying assist  Assistive Device None  Distance Ambulated (ft) 30 ft  Activity Response Tolerated well  Mobility Referral Yes  $Mobility charge 1 Mobility  Mobility Specialist Start Time (ACUTE ONLY) 1032  Mobility Specialist Stop Time (ACUTE ONLY) 1041  Mobility Specialist Time Calculation (min) (ACUTE ONLY) 9 min   Pt OOB in hallway on RA upon arrival. Pt has jerking motion in RUE and needs CGA to return to room. RN notified. Pt left EOB with needs in reach.   Terrilyn Saver  Mobility Specialist  01/08/23 10:44 AM

## 2023-01-08 NOTE — Progress Notes (Signed)
SLP Cancellation Note  Patient Details Name: Edgar White MRN: [id number] DOB: [birth year]   Cancelled treatment:       Reason Eval/Treat Not Completed: SLP screened, no needs identified, will sign off   SLP consult received and appreciated. Per chart review, stroke work up negative. No speech/language/cognitive concerns at this time.  Clyde Canterbury, M.S., CCC-SLP Speech-Language Pathologist [facility] [phone] (ASCOM)  Woodroe Chen 01/08/2023, 8:09 AM

## 2023-01-08 NOTE — Progress Notes (Signed)
Mobility Specialist - Progress Note   01/08/23 0839  Mobility  Activity Ambulated independently in hallway  Level of Assistance Independent  Assistive Device None  Distance Ambulated (ft) 1000 ft  Activity Response Tolerated well  Mobility Referral Yes  $Mobility charge 1 Mobility  Mobility Specialist Start Time (ACUTE ONLY) 0816  Mobility Specialist Stop Time (ACUTE ONLY) 0836  Mobility Specialist Time Calculation (min) (ACUTE ONLY) 20 min   Pt supine in bed on RA upon arrival. Pt STS and ambulates in hallway to [facility] and back indep with no LOB noted. Pt left EOB with needs in reach.   Terrilyn Saver  Mobility Specialist  01/08/23 8:40 AM

## 2023-02-10 ENCOUNTER — Emergency Department
Admission: EM | Admit: 2023-02-10 | Discharge: 2023-02-11 | Disposition: A | Discharge status: Home / Self Care | Payer: Medicaid Other | Attending: Emergency Medicine | Admitting: Emergency Medicine

## 2023-02-10 DIAGNOSIS — F1590 Other stimulant use, unspecified, uncomplicated: Secondary | ICD-10-CM | POA: Diagnosis not present

## 2023-02-10 DIAGNOSIS — T887XXA Unspecified adverse effect of drug or medicament, initial encounter: Secondary | ICD-10-CM | POA: Insufficient documentation

## 2023-02-10 DIAGNOSIS — E119 Type 2 diabetes mellitus without complications: Secondary | ICD-10-CM | POA: Insufficient documentation

## 2023-02-10 DIAGNOSIS — F1510 Other stimulant abuse, uncomplicated: Secondary | ICD-10-CM

## 2023-02-10 DIAGNOSIS — R259 Unspecified abnormal involuntary movements: Secondary | ICD-10-CM | POA: Insufficient documentation

## 2023-02-10 DIAGNOSIS — F419 Anxiety disorder, unspecified: Secondary | ICD-10-CM | POA: Diagnosis not present

## 2023-02-10 DIAGNOSIS — T44905A Adverse effect of unspecified drugs primarily affecting the autonomic nervous system, initial encounter: Secondary | ICD-10-CM

## 2023-02-10 DIAGNOSIS — T441X5A Adverse effect of other parasympathomimetics [cholinergics], initial encounter: Secondary | ICD-10-CM | POA: Insufficient documentation

## 2023-02-10 LAB — CBC
HCT: 43 % (ref 39.0–52.0)
Hemoglobin: 14.8 g/dL (ref 13.0–17.0)
MCH: 31 pg (ref 26.0–34.0)
MCHC: 34.4 g/dL (ref 30.0–36.0)
MCV: 90 fL (ref 80.0–100.0)
Platelets: 280 10*3/uL (ref 150–400)
RBC: 4.78 MIL/uL (ref 4.22–5.81)
RDW: 12.1 % (ref 11.5–15.5)
WBC: 7.5 10*3/uL (ref 4.0–10.5)
nRBC: 0 % (ref 0.0–0.2)

## 2023-02-10 MED ORDER — MIDAZOLAM HCL 2 MG/2ML IJ SOLN
INTRAMUSCULAR | Status: AC
  Filled 2023-02-10: qty 2

## 2023-02-10 MED ORDER — MIDAZOLAM HCL 2 MG/2ML IJ SOLN: 2.0000 mg | Freq: Once | INTRAMUSCULAR | Status: DC

## 2023-02-10 MED ORDER — MIDAZOLAM HCL 2 MG/2ML IJ SOLN: 2.0000 mg | Freq: Once | INTRAMUSCULAR | Status: DC | PRN

## 2023-02-10 MED ORDER — MIDAZOLAM HCL 2 MG/2ML IJ SOLN
2.0000 mg | Freq: Once | INTRAMUSCULAR | Status: AC
  Administered 2023-02-10: 2 mg via INTRAVENOUS

## 2023-02-10 NOTE — ED Provider Notes (Signed)
Emergency Medicine Provider Triage Evaluation Note  Edgar White , a 57 y.o. male  was evaluated in triage.  Pt complains of feeling extremely anxious, used methamphetamine.  Review of Systems  Positive: Anxious, Negative:   Physical Exam  BP (!) 154/87 (BP Location: Left Arm)   Pulse (!) 116   Ht 5\' 10"  (1.778 m)   Wt 74.8 kg   SpO2 100%   BMI 23.66 kg/m  Gen:   Awake, diaphoretic, extremely anxious Resp:  Normal effort but tachypnea MSK:   Moves extremities without difficulty  Other:  Is fully alert oriented.  Appears extremely anxious.  Hyperventilating.  Medical Decision Making  Medically screening exam initiated at 11:48 PM.  Appropriate orders placed.  Detailed evaluation will be completed by another provider, this initial triage assessment does not replace that evaluation.  In short, he experienced extremely anxious, recently use methamphetamine, and appears to be having evidence of increased sympathetic tone, diaphoresis tachycardia, anxiety.  Discussed with patient, he has previously used Klonopin with success.  At this point given his degree of agitation and anxious level, we will start with midazolam IV to assist in calming     Sharyn Creamer, MD 02/10/23 2350

## 2023-02-10 NOTE — ED Triage Notes (Signed)
Patient  BIB [facility] from home. Patient reports that he took crystal meth at 1AM .  Patient was driving home today and felt like his HR was going up.Upon arrival to ED patient has jerky movement to all extremities.

## 2023-02-11 LAB — BASIC METABOLIC PANEL
Anion gap: 11 (ref 5–15)
BUN: 9 mg/dL (ref 6–20)
CO2: 19 mmol/L — ABNORMAL LOW (ref 22–32)
Calcium: 9.3 mg/dL (ref 8.9–10.3)
Chloride: 104 mmol/L (ref 98–111)
Creatinine, Ser: 0.92 mg/dL (ref 0.61–1.24)
GFR, Estimated: >60 mL/min (ref >60)
Glucose, Bld: 194 mg/dL — ABNORMAL HIGH (ref 70–99)
Potassium: 3.9 mmol/L (ref 3.5–5.1)
Sodium: 134 mmol/L — ABNORMAL LOW (ref 135–145)

## 2023-02-11 LAB — ACETAMINOPHEN LEVEL: Acetaminophen (Tylenol), Serum: <10 ug/mL — ABNORMAL LOW (ref 10–30)

## 2023-02-11 LAB — ETHANOL: Alcohol, Ethyl (B): <10 mg/dL (ref <10)

## 2023-02-11 LAB — SALICYLATE LEVEL: Salicylate Lvl: <7 mg/dL — ABNORMAL LOW (ref 7.0–30.0)

## 2023-02-11 LAB — CK: Total CK: 329 U/L (ref 49–397)

## 2023-02-11 MED ORDER — DIPHENHYDRAMINE HCL 50 MG/ML IJ SOLN
25.0000 mg | Freq: Once | INTRAMUSCULAR | Status: AC
  Administered 2023-02-11: 25 mg via INTRAVENOUS
  Filled 2023-02-11: qty 1

## 2023-02-11 MED ORDER — SODIUM CHLORIDE 0.9 % IV BOLUS
1000.0000 mL | Freq: Once | INTRAVENOUS | Status: AC
  Administered 2023-02-11: 1000 mL via INTRAVENOUS

## 2023-02-11 MED ORDER — CLONAZEPAM 0.5 MG PO TABS
0.5000 mg | ORAL_TABLET | Freq: Once | ORAL | Status: AC
  Administered 2023-02-11: 0.5 mg via ORAL
  Filled 2023-02-11: qty 1

## 2023-02-11 NOTE — ED Provider Notes (Addendum)
[facility] Provider Note    Event Date/Time   First MD Initiated Contact with Patient 02/11/23 [phone]     (approximate)   History   Drug Problem   HPI  Edgar White is a 57 y.o. male   Past medical history of depression, diabetes, polysubstance use, who presents to the emergency department with anxiety, uncontrolled jerking movement all extremities, palpitations.  He took methamphetamine recently - he thinks about 24hr ago.  He denies any other drug use or alcohol use.  He denies pain or recent illnesses.  He has no other acute  medical complaints.   He denies self-harm or suicidality.   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 --      Excl. in GC? --     Most recent vital signs: Vitals:   02/11/23 0000 02/11/23 0030  BP: 118/76 121/78  Pulse: 87 74  Resp: (!) 21   SpO2: 100% 100%    General: Awake, no distress.  CV:  Good peripheral perfusion.  Resp:  Normal effort.  Abd:  No distention.  Other:  Awake alert comfortable no acute distress has uncontrolled jerking movements of upper and lower extremities, no obvious trauma, lungs clear, normal hemodynamics.   ED Results / Procedures / Treatments   Labs (all labs ordered are listed, but only  abnormal results are displayed) Labs Reviewed  BASIC METABOLIC PANEL - Abnormal; Notable for the following components:      Result Value   Sodium 134 (*)    CO2 19 (*)    Glucose, Bld 194 (*)    All other components within normal limits  ACETAMINOPHEN LEVEL - Abnormal; Notable for the following components:   Acetaminophen (Tylenol), Serum <10 (*)    All other components within normal limits  SALICYLATE LEVEL - Abnormal; Notable for the following components:   Salicylate Lvl <7.0 (*)    All other components within normal limits  CBC  CK  ETHANOL  URINE DRUG SCREEN, QUALITATIVE ([facility] ONLY)     I ordered and reviewed the above labs they are notable for bicarb 19, glucose 194, creatinine normal.  CK normal. EKG  ED ECG REPORT I, Pilar Jarvis, the attending physician, personally viewed and interpreted this ECG.   Date: 02/11/2023  EKG Time: 2357  Rate: 94  Rhythm: sinus  Axis: nl  Intervals:none  ST&T Change: no stemi     PROCEDURES:  Critical Care performed: Yes, see critical care procedure note(s)  .  Critical Care  Performed by: Pilar Jarvis, MD Authorized by: Pilar Jarvis, MD   Critical care provider statement:    Critical care time (minutes):  30   Critical care was necessary to treat or prevent imminent or life-threatening deterioration of the following conditions:  Toxidrome   Critical care was time spent personally by me on the following activities:  Development of treatment plan with patient or surrogate, discussions with consultants, evaluation of patient's response to treatment, examination of patient, ordering and review of laboratory studies, ordering and review of radiographic studies, ordering and performing treatments and interventions, pulse oximetry, re-evaluation of patient's condition and review of old charts    MEDICATIONS ORDERED IN ED: Medications  midazolam (VERSED) injection 2 mg (has no administration in time range)  diphenhydrAMINE (BENADRYL)  injection 25 mg (has no administration in time range)  clonazePAM (KLONOPIN) tablet 0.5 mg (has no administration in time range)  midazolam (VERSED) injection 2 mg ( Intravenous Canceled Entry 02/11/23 0034)  sodium chloride 0.9 % bolus 1,000 mL (1,000 mLs Intravenous New Bag/Given 02/11/23 0034)     IMPRESSION / MDM / ASSESSMENT AND PLAN / ED COURSE  I reviewed the triage vital signs and the nursing notes.                                Patient's presentation is most consistent with acute presentation with potential threat to life or bodily function.  Differential diagnosis includes, but is not limited to, methamphetamine or other stimulant intoxication, akathisia, dysrhythmia, electrolyte disturbance, withdrawal, psychogenic movement disorder   The patient is on the cardiac monitor to evaluate for evidence of arrhythmia and/or significant heart rate changes.  MDM:    Patient seen by initial provider see MSE note look to sympathomimetic overdose in the setting of methamphetamine use, tachycardic, anxious, but the patient states he feels much better now after getting some Versed tachycardia resolved seems comfortable no acute complaints except for his jerking movements uncontrollable of upper and lower extremities.  He was worked up with a very thorough evaluation and neurologic consultation last month in the hospital thought to be psychogenic in nature, started on Klonopin.  He feels well now.  I will give him some Klonopin and Benadryl to see if we can alleviate some of those abnormal movements, observe in the emergency department after suspected sympathomimetic overdose now much improved to determine disposition.  --- Is much better now.  Hemodynamics appropriate reassuring no signs of sympathomimetic toxidrome currently.  He has been observed for some hours now and given his reported use more than 24 hours ago I doubt there is any significant element of methamphetamine effects at this  time.  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  He understands to return with any new or worsening symptoms.      FINAL CLINICAL IMPRESSION(S) / ED DIAGNOSES   Final diagnoses:  Methamphetamine use (HCC)  Sympathomimetic adverse reaction, initial encounter  Abnormal movements     Rx / DC Orders   ED Discharge Orders     None        Note:  This document was prepared using Dragon voice recognition software  and may include unintentional dictation errors.    Pilar Jarvis, MD 02/11/23 2956    Pilar Jarvis, MD 02/11/23 [phone]

## 2023-02-11 NOTE — ED Notes (Signed)
Patient upset that he is being discharged. " I don't understand why I am having these episodes and nobody can tell me anything but follow up with your doctor." This RN went over discharge instructions with patient and patient denied having any questions.

## 2023-02-11 NOTE — Discharge Instructions (Signed)
For your abnormal movements, take Klonopin as prescribed previously and follow-up with your regular doctor this week for a checkup.

## 2023-04-05 ENCOUNTER — Ambulatory Visit: Payer: MEDICAID | Admitting: Neurology

## 2023-04-05 ENCOUNTER — Encounter: Payer: Self-pay | Admitting: Neurology

## 2023-04-05 VITALS — BP 156/90 | Ht 70.0 in | Wt 153.0 lb

## 2023-04-05 DIAGNOSIS — R259 Unspecified abnormal involuntary movements: Secondary | ICD-10-CM | POA: Diagnosis not present

## 2023-04-05 DIAGNOSIS — F1910 Other psychoactive substance abuse, uncomplicated: Secondary | ICD-10-CM | POA: Diagnosis not present

## 2023-04-05 DIAGNOSIS — I639 Cerebral infarction, unspecified: Secondary | ICD-10-CM | POA: Diagnosis not present

## 2023-04-05 NOTE — Progress Notes (Signed)
Chief Complaint  Patient presents with   New Patient (Initial Visit)    Rm 14, urgent referral, 12/07/22 uncontrolled tremors, tourettes like movements, has seizures, body gets in a ball and contracted, patient believes may have mold poisoning.       ASSESSMENT AND PLAN  Edgar White is a 57 y.o. 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   MEDICAL HISTORY:  Edgar White is evidence of 57 year old male, seen in request by his primary care physician from [facility] Dr. Quillian Quince, Vernona Rieger for evaluation of abnormal movement  I reviewed and summarized the referring note. PMHX HTN HLD DM Depression, anxiety Lumbar fusion History of cocaine use, Cyrstal meth, marijuana.  He had long history of polysubstance abuse, mood disorder, taking Effexor 150 mg for many years, reported that his mood disorder is under good control, has been on disability for his mood disorder, previous UDS was positive for marijuana, cocaine, he also reported history of crystal meth use,  Initial use of crystal meth was in February 2024, few other sessions afterwards, in April 2024, he reported sudden onset body jerking movement, he has no control over it, abrupt shoulder movement, with no loss of consciousness, reported unsteady gait, no family history of such  He was seen by primary  care physician, tried Depakote for few days without benefit, he decided not to continue taking it, very tearful during today's examination, insistent over the past 2 years, he was exposed to [facility] admission in May 2024 for abnormal movement, numbness of arms,  Personally reviewed,   MRI of brain on Jan 07 2023 1. Mildly motion degraded exam. No evidence of acute infarct, but evidence of Right ICA POOR FLOW versus OCCLUSION, and scattered areas of right hemisphere encephalomalacia indicative of previous infarcts. CTA head and neck would best evaluate the Right ICA.   2. Elsewhere negative noncontrast MRI appearance of the Brain. Bilateral deep gray nuclei, brainstem and cerebellum appear normal. But note that early metastatic disease to the brain is difficult to exclude in the absence of intravenous contrast.  CTA of head and neck   1. 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 No reconstitution in the neck. Reconstituted Right ICA terminus via the Right Posterior Communicating Artery. Mildly irregular Right MCA branches compatible with previous infarcts in that territory.   2. No evidence of emergent large vessel occlusion. And only mild additional atherosclerosis in the head and neck. No other significant arterial stenosis.   3. No malignant neck mass or lymphadenopathy identified by CTA (conventional neck CT would be more sensitive.  MRI cervical  1. Motion degraded examination. 2. Multilevel cervical spondylosis with multilevel bilateral foraminal stenosis, most severe on the left at C5-6 and C6-7. 3. No canal stenosis at any level.  Laboratory evaluations showed A1c 9.7, low potassium 2.9, UDS was positive for marijuana, previous positive for cocaine,  negative HIV TSH, alcohol level, normal CBC, lipid panel showed LDL 161, triglyceride 198,   PHYSICAL EXAM:   Vitals:   04/05/23 1112   BP: (!) 156/90  Weight: 153 lb (69.4 kg)  Height: 5\' 10"  (1.778 m)   Body mass index is 21.95 kg/m.  PHYSICAL EXAMNIATION:  Gen: NAD, conversant, well nourised, well groomed                     Cardiovascular: Regular rate rhythm, no peripheral edema, warm, nontender. Eyes: Conjunctivae clear without exudates or hemorrhage Neck: Supple, no carotid bruits. Pulmonary: Clear to auscultation bilaterally   NEUROLOGICAL EXAM:  MENTAL STATUS: Speech/cognition: tearful during today's visit, frequent large amplitude upper body jerking movement CRANIAL NERVES: CN II: Visual fields are full to confrontation. Pupils are round equal and briskly reactive to light. CN III, IV, VI: extraocular movement are normal. No ptosis. CN V: Facial sensation is intact to light touch CN VII: Face is symmetric with normal eye closure  CN VIII: Hearing is normal to causal conversation. CN IX, X: Phonation is normal. CN XI: Head turning and shoulder shrug are intact  MOTOR: There is no pronator drift of out-stretched arms. Muscle bulk and tone are normal. Muscle strength is normal.  REFLEXES: Reflexes are 1 and symmetric at the biceps, triceps, knees, and ankles. Plantar responses are flexor.  SENSORY: Intact to light touch, pinprick and vibratory sensation are intact in fingers and toes.  COORDINATION: There is no trunk or limb dysmetria noted.  GAIT/STANCE: Cautious, but fairly steady  REVIEW OF SYSTEMS:  Full 14 system review of systems performed and notable only for as above All other review of systems were negative.   ALLERGIES: No Known Allergies  HOME MEDICATIONS: Current Outpatient Medications  Medication Sig Dispense Refill   atorvastatin (LIPITOR) 20 MG tablet Take 1 tablet (20 mg total) by mouth daily. 30 tablet 2   clonazePAM (KLONOPIN) 0.5 MG tablet Take 1 tablet (0.5 mg total) by mouth 2 (two) times daily. 60 tablet 0   irbesartan (AVAPRO) 75 MG tablet Take 1 tablet (75 mg  total) by mouth daily. 30 tablet 2   venlafaxine XR (EFFEXOR-XR) 150 MG 24 hr capsule Take 1 capsule (150 mg total) by mouth daily with breakfast. 30 capsule 1   No current facility-administered medications for this visit.    PAST MEDICAL HISTORY: Past Medical History:  Diagnosis Date   Basal cell carcinoma 06/22/2022   L medial post shoulder, [facility]   Basal cell carcinoma 06/22/2022   L spinal mid back, [facility]   Basal cell carcinoma 06/22/2022   L spinal upper back, [facility]   Basal cell carcinoma 06/22/2022   R nasal ala, MOHs completed 08/09/22   Basal cell carcinoma 06/22/2022   R upper back, [facility]   Depression    Diabetes mellitus without complication (HCC)     PAST SURGICAL HISTORY: Past Surgical History:  Procedure Laterality Date   INGUINAL HERNIA REPAIR     LUMBAR FUSION      FAMILY HISTORY: Family History  Problem Relation Age of Onset   Cancer Mother    Basal cell carcinoma Sister    Squamous cell carcinoma Sister     SOCIAL HISTORY: Social History   Socioeconomic History   Marital status: Single    Spouse name: Not on file   Number of children: Not on file   Years of education: Not on file   Highest education level: Not on file  Occupational  History   Not on file  Tobacco Use   Smoking status: Every Day    Current packs/day: 1.00    Types: Cigarettes   Smokeless tobacco: Current  Vaping Use   Vaping status: Never Used  Substance and Sexual Activity   Alcohol use: No    Comment: denies use   Drug use: Not Currently    Types: Marijuana, Cocaine    Comment: marijuana currently, no recent cocaine use   Sexual activity: Never  Other Topics Concern   Not on file  Social History Narrative   Right handed   Lives alone   Caffeine 3 cups daily   Social Determinants of Health   Financial Resource Strain: Not on file  Food Insecurity: No Food Insecurity (01/07/2023)   Hunger Vital Sign    Worried About Running Out of Food in the Last Year: Never true     Ran Out of Food in the Last Year: Never true  Transportation Needs: No Transportation Needs (01/07/2023)   PRAPARE - Administrator, Civil Service (Medical): No    Lack of Transportation (Non-Medical): No  Physical Activity: Not on file  Stress: Not on file  Social Connections: Not on file  Intimate Partner Violence: Not At Risk (01/07/2023)   Humiliation, Afraid, Rape, and Kick questionnaire    Fear of Current or Ex-Partner: No    Emotionally Abused: No    Physically Abused: No    Sexually Abused: No      Levert Feinstein, M.D. Ph.D.  [facility] [address], [address] [place], [place] [address] Ph: [phone] Fax: [phone]  CC:  Dortha Kern, MD [address] [place],  [place] [address]  Dortha Kern, MD

## 2023-04-06 ENCOUNTER — Telehealth: Payer: Self-pay | Admitting: Neurology

## 2023-04-06 NOTE — Telephone Encounter (Signed)
Called patient back and he is concerned that all his notes say about his medical concerns are drug use and that is all he is seen for. He states that he has done those drugs in the past because he thought he was dying so he was going to do what made him happy. He is still having the numbness in his hands from weeks ago and they are no different than what was discussed at the appointment with Dr. Terrace Arabia yesterday, I advised we wait for lab work and EEG results before we discuss next steps but that I would let the provider know as an update. He also wants [place] to be aware to he has reached out to the his PCP about the mold infection from his home because he is trying to sue his landlord because they believe there is mold in his house.

## 2023-04-06 NOTE — Telephone Encounter (Signed)
Pt is requesting a call to discuss some numbness and tingling he is experiencing in his left hand and he is also having issues gripping things. Pt stated this symptoms started a few weeks ago

## 2023-04-09 ENCOUNTER — Other Ambulatory Visit: Payer: Self-pay

## 2023-04-09 ENCOUNTER — Emergency Department
Admission: EM | Admit: 2023-04-09 | Discharge: 2023-04-09 | Disposition: A | Discharge status: Home / Self Care | Payer: MEDICAID | Attending: Emergency Medicine | Admitting: Emergency Medicine

## 2023-04-09 DIAGNOSIS — E119 Type 2 diabetes mellitus without complications: Secondary | ICD-10-CM | POA: Insufficient documentation

## 2023-04-09 DIAGNOSIS — R202 Paresthesia of skin: Secondary | ICD-10-CM

## 2023-04-09 DIAGNOSIS — Z8673 Personal history of transient ischemic attack (TIA), and cerebral infarction without residual deficits: Secondary | ICD-10-CM | POA: Diagnosis not present

## 2023-04-09 NOTE — ED Triage Notes (Signed)
Pt sts that he has been having numbness in his ext. Pt sts that there is mold in his apt and is suing his landlord and the pt pcp has sent him here to get that testing done.

## 2023-04-09 NOTE — ED Provider Notes (Signed)
[facility] Provider Note    Event Date/Time   First MD Initiated Contact with Patient 04/09/23 1728     (approximate)   History   Mold Testing   HPI  Edgar White is a 57 y.o. male with history of diabetes, CVA, and substance abuse presents emergency department stating he keeps getting paresthesias in his arms and feet.  Patient states he thinks he has mold toxicity.  States that he is limited apartment and feels like he has black mold.  Has not had the area inspected yet.  Wants to sue his landlord.  He is denies chest pain or shortness of breath.  No cough or congestion.  No body aches.      Physical Exam   Triage Vital Signs: ED Triage Vitals  Encounter Vitals Group     BP 04/09/23 1712 (!) 128/92     Systolic BP Percentile --      Diastolic BP Percentile --      Pulse Rate 04/09/23 1712 (!) 109     Resp 04/09/23 1712 16     Temp 04/09/23 1712 98.7 F (37.1 C)     Temp Source 04/09/23 1712 Oral     SpO2 04/09/23 1712 98 %     Weight 04/09/23 1713 150 lb (68 kg)     Height 04/09/23 1713 5\' 10"  (1.778 m)     Head Circumference --      Peak Flow --      Pain Score 04/09/23 1713 4     Pain Loc --      Pain Education --      Exclude from Growth Chart --     Most recent vital signs: Vitals:   04/09/23 1712  BP: (!) 128/92  Pulse: (!) 109  Resp: 16  Temp: 98.7 F (37.1 C)  SpO2: 98%     General: Awake, no distress.   CV:  Good peripheral perfusion. regular rate and  rhythm Resp:  Normal effort. Lungs cta Abd:  No distention.   Other:      ED Results / Procedures / Treatments   Labs (all labs ordered are listed, but only abnormal results are displayed) Labs Reviewed - No data to display   EKG     RADIOLOGY     PROCEDURES:   Procedures   MEDICATIONS ORDERED IN ED: Medications - No data to display   IMPRESSION / MDM / ASSESSMENT AND PLAN / ED COURSE  I reviewed the triage vital signs and the nursing  notes.                              Differential diagnosis includes, but is not limited to, paresthesias, mold exposure, feared condition without diagnosis  Patient's presentation is most consistent with acute, uncomplicated illness.   Patient appears to be well, neuro appears to be intact, grips equal bilaterally, do not feel that the patient has any new findings to be evaluated.  Mostly he is concerned about the mold in his apartment.  Told him to call the health department and DSS maybe they can help him find alternative housing.  Health department can help him assess the area for black mold.  Patient is in agreement treatment plan.  Discharged stable condition.      FINAL CLINICAL IMPRESSION(S) / ED DIAGNOSES   Final diagnoses:  Paresthesia of hand, bilateral     Rx / DC Orders  ED Discharge Orders     None        Note:  This document was prepared using Dragon voice recognition software and may include unintentional dictation errors.    Faythe Ghee, PA-C 04/09/23 Lynelle Smoke    Janith Lima, MD 04/10/23 1245

## 2023-04-09 NOTE — Discharge Instructions (Signed)
Follow-up with the [facility] department to ask about mold testing in your home. Try to remove yourself from the area if it is positive for mold

## 2023-04-17 ENCOUNTER — Ambulatory Visit (INDEPENDENT_AMBULATORY_CARE_PROVIDER_SITE_OTHER): Payer: MEDICAID | Admitting: Neurology

## 2023-04-17 DIAGNOSIS — F1910 Other psychoactive substance abuse, uncomplicated: Secondary | ICD-10-CM

## 2023-04-17 DIAGNOSIS — R259 Unspecified abnormal involuntary movements: Secondary | ICD-10-CM

## 2023-04-21 LAB — COMPREHENSIVE METABOLIC PANEL
ALT: 17 IU/L (ref 0–44)
AST: 21 IU/L (ref 0–40)
Albumin: 4.6 g/dL (ref 3.8–4.9)
Alkaline Phosphatase: 129 IU/L — ABNORMAL HIGH (ref 44–121)
BUN/Creatinine Ratio: 8 — ABNORMAL LOW (ref 9–20)
BUN: 7 mg/dL (ref 6–24)
Bilirubin Total: 0.4 mg/dL (ref 0.0–1.2)
CO2: 24 mmol/L (ref 20–29)
Calcium: 9.6 mg/dL (ref 8.7–10.2)
Chloride: 96 mmol/L (ref 96–106)
Creatinine, Ser: 0.85 mg/dL (ref 0.76–1.27)
Globulin, Total: 2.9 g/dL (ref 1.5–4.5)
Glucose: 213 mg/dL — ABNORMAL HIGH (ref 70–99)
Potassium: 4 mmol/L (ref 3.5–5.2)
Sodium: 132 mmol/L — ABNORMAL LOW (ref 134–144)
Total Protein: 7.5 g/dL (ref 6.0–8.5)
eGFR: 101 mL/min/{1.73_m2} (ref >59)

## 2023-04-21 LAB — ANA W/REFLEX IF POSITIVE: Anti Nuclear Antibody (ANA): NEGATIVE

## 2023-04-21 LAB — COPPER, SERUM: Copper: 120 ug/dL (ref 69–132)

## 2023-04-21 LAB — HGB A1C W/O EAG: Hgb A1c MFr Bld: 9.9 % — ABNORMAL HIGH (ref 4.8–5.6)

## 2023-04-21 LAB — FERRITIN: Ferritin: 127 ng/mL (ref 30–400)

## 2023-04-21 LAB — C-REACTIVE PROTEIN: CRP: 2 mg/L (ref 0–10)

## 2023-04-21 LAB — HEAVY METALS PROFILE II, BLOOD
Arsenic: 2 ug/L (ref 0–9)
Cadmium: <0.5 ug/L (ref 0.0–1.2)
Lead, Blood: 1.4 ug/dL (ref 0.0–3.4)
Mercury: <1 ug/L (ref 0.0–14.9)

## 2023-04-21 LAB — CERULOPLASMIN: Ceruloplasmin: 28.8 mg/dL (ref 16.0–31.0)

## 2023-04-21 LAB — HUNTINGTON DISEASE REPEAT EXP

## 2023-04-21 LAB — SEDIMENTATION RATE: Sed Rate: 4 mm/h (ref 0–30)

## 2023-04-21 LAB — SYPHILIS: RPR W/REFLEX TO RPR TITER AND TREPONEMAL ANTIBODIES, TRADITIONAL SCREENING AND DIAGNOSIS ALGORITHM: RPR Ser Ql: NONREACTIVE

## 2023-04-21 LAB — FOLATE: Folate: 10 ng/mL (ref >3.0)

## 2023-04-22 LAB — THC,MS,WB/SP RFX
Cannabidiol: NEGATIVE ng/mL
Cannabinoid Confirmation: POSITIVE
Carboxy-THC: 46.4 ng/mL
Hydroxy-THC: NEGATIVE ng/mL
Tetrahydrocannabinol(THC): 1.7 ng/mL

## 2023-04-22 LAB — AMPHETAMINES/MDMA,MS,WB/SP RFX
Amphetamine: NEGATIVE ng/mL
Amphetamines Confirmation: POSITIVE
MDA: NEGATIVE ng/mL
MDEA: NEGATIVE ng/mL
MDMA: NEGATIVE ng/mL
Methamphetamine: 109 ng/mL

## 2023-04-22 LAB — ACUTE VIRAL HEPATITIS (HAV, HBV, HCV)
HCV Ab: NONREACTIVE
Hep A IgM: NEGATIVE
Hep B C IgM: NEGATIVE
Hepatitis B Surface Ag: NEGATIVE

## 2023-04-22 LAB — DRUG SCREEN 10 W/CONF, SERUM
Amphetamines, IA: POSITIVE ng/mL — AB
Barbiturates, IA: NEGATIVE ug/mL
Benzodiazepines, IA: NEGATIVE ng/mL
Cocaine & Metabolite, IA: NEGATIVE ng/mL
Methadone, IA: NEGATIVE ng/mL
Opiates, IA: NEGATIVE ng/mL
Oxycodones, IA: NEGATIVE ng/mL
Phencyclidine, IA: NEGATIVE ng/mL
Propoxyphene, IA: NEGATIVE ng/mL
THC(Marijuana) Metabolite, IA: POSITIVE ng/mL — AB

## 2023-04-22 LAB — HCV INTERPRETATION

## 2023-04-26 LAB — INFORMED CONSENT NEEDED

## 2023-05-02 ENCOUNTER — Telehealth: Payer: Self-pay | Admitting: Neurology

## 2023-05-02 NOTE — Telephone Encounter (Signed)
I saw patient April 05, 2023 for abnormal movement, he also had a history of mood disorder, polysubstance abuse,  I ordered Huntington's genetic testing,  I called patient today to explain the purpose of the test to rule out a genetic disorder for his abnormal movement, patient agreed, he also understand the potential impact to him and to his family if the test to be positive.  He never married, has no children, has 3 other siblings, 2 older sister, 1 older brother, none of them have abnormal movements.  He denies family history of such.

## 2023-05-02 NOTE — Telephone Encounter (Signed)
error 

## 2023-05-02 NOTE — Procedures (Signed)
   HISTORY: 57 year old with subacute onset abnormal body movement  TECHNIQUE:  This is a routine 16 channel EEG recording with one channel devoted to a limited EKG recording.  It was performed during wakefulness, drowsiness and asleep.  Hyperventilation and photic stimulation were performed as activating procedures.  There are frequent bilateral frontal muscle artifact noted.  Upon maximum arousal, posterior dominant waking rhythm consistent of rhythmic alpha range activity. Activities are symmetric over the bilateral posterior derivations and attenuated with eye opening.  Photic stimulation did not alter the tracing.  Hyperventilation produced mild/moderate buildup with higher amplitude and the slower activities noted.  During EEG recording, patient developed drowsiness and entered sleep, sleep EEG demonstrated architecture, there were frontal centrally dominant vertex waves and symmetric sleep spindles noted.  During EEG recording, there was no epileptiform discharge noted.  EKG demonstrate normal sinus rhythm.  CONCLUSION: This is a  normal awake and asleep EEG.  There is no electrodiagnostic evidence of epileptiform discharge.  Levert Feinstein, M.D. Ph.D.  [facility] [address] [place], [place] [address] Phone: [phone] Fax:      [phone]

## 2023-05-04 ENCOUNTER — Telehealth: Payer: Self-pay

## 2023-05-04 NOTE — Telephone Encounter (Signed)
  Informed consent faxed

## 2023-05-14 ENCOUNTER — Other Ambulatory Visit: Payer: Self-pay

## 2023-05-14 ENCOUNTER — Emergency Department
Admission: EM | Admit: 2023-05-14 | Discharge: 2023-05-14 | Disposition: A | Discharge status: Home / Self Care | Payer: MEDICAID | Attending: Emergency Medicine | Admitting: Emergency Medicine

## 2023-05-14 DIAGNOSIS — M25532 Pain in left wrist: Secondary | ICD-10-CM | POA: Diagnosis present

## 2023-05-14 DIAGNOSIS — G8929 Other chronic pain: Secondary | ICD-10-CM | POA: Insufficient documentation

## 2023-05-14 MED ORDER — LORAZEPAM 1 MG PO TABS
1.0000 mg | ORAL_TABLET | Freq: Once | ORAL | Status: AC
  Administered 2023-05-14: 1 mg via ORAL
  Filled 2023-05-14: qty 1

## 2023-05-14 MED ORDER — KETOROLAC TROMETHAMINE 30 MG/ML IJ SOLN
30.0000 mg | Freq: Once | INTRAMUSCULAR | Status: AC
  Administered 2023-05-14: 30 mg via INTRAMUSCULAR
  Filled 2023-05-14: qty 1

## 2023-05-14 NOTE — ED Notes (Signed)
Patient provided with meds, drink, and sandwich tray at this time. Patient is extremely tearful and tells this RN that he is dying of black mold poisoning which has caused 'permanent neurological damage'. This RN provided reassurance. VSS, call light within reach.

## 2023-05-14 NOTE — ED Triage Notes (Signed)
Pt coming by EMS from bar with reports of bilateral wrist pain, but when asked about pain pt denies any pain. Pt tearful in triage but denies any SI.

## 2023-05-14 NOTE — ED Provider Notes (Signed)
[facility] Provider Note    Event Date/Time   First MD Initiated Contact with Patient 05/14/23 0255     (approximate)   History   Wrist Pain   HPI  Edgar White is a 57 y.o. male who presents to the ED for evaluation of Wrist Pain   I review a neurology clinic visit from 1 month ago.  History of polysubstance abuse.  Chronically occluded right ICA origin, reconstitutes at the terminus via PCA  Patient presents to the ED via EMS from a local bar for evaluation of chronic wrist pain, tearfulness and concern he might be dying.  Reports only imbibing 1 beer tonight without any other recreational drugs.  Reports weeks/months of left radial wrist and thumb pain.  No inciting injuries.  Adamantly denies any suicidal intent or plans, but is tearful.    Physical Exam   Triage Vital Signs: ED Triage Vitals  Encounter Vitals Group     BP 05/14/23 0120 (!) 148/86     Systolic BP Percentile --      Diastolic BP Percentile --      Pulse Rate 05/14/23 0122 (!) 109     Resp 05/14/23 0120 20     Temp 05/14/23 0120 98.4 F (36.9 C)     Temp Source 05/14/23 0120 Oral     SpO2 05/14/23 0122 99 %     Weight --      Height --      Head Circumference --      Peak Flow --      Pain Score 05/14/23 0120 0     Pain Loc --      Pain Education --      Exclude from Growth Chart --     Most recent vital signs: Vitals:   05/14/23 0122 05/14/23 0330  BP:  (!) 195/168  Pulse: (!) 109 (!) 102  Resp:    Temp:    SpO2: 99% 100%    General: Awake, no distress.  Tearful but with linear thought processes.  Conversational CV:  Good peripheral perfusion.  Resp:  Normal effort.  Abd:  No distention.  MSK:  No deformity noted.  Mild and poorly localizing tenderness throughout the  left radial wrist and thumb, lesser tenderness throughout the thenar eminence.  No signs of trauma or impaired range of motion Neuro:  No focal deficits appreciated. Other:     ED  Results / Procedures / Treatments   Labs (all labs ordered are listed, but only abnormal results are displayed) Labs Reviewed - No data to display  EKG   RADIOLOGY   Official radiology report(s): No results found.  PROCEDURES and INTERVENTIONS:  Procedures  Medications  ketorolac (TORADOL) 30 MG/ML injection 30 mg (30 mg Intramuscular Given 05/14/23 0323)  LORazepam (ATIVAN) tablet 1 mg (1 mg Oral Given 05/14/23 0322)     IMPRESSION / MDM / ASSESSMENT AND PLAN / ED COURSE  I reviewed the triage vital signs and the nursing notes.  Differential diagnosis includes, but is not limited to, polysubstance abuse, intentional overdose, suicidal, strain, fracture or dislocation  Patient with history of polysubstance abuse presents with chronic wrist pain and tearfulness but denying suicidal intent or overdose.  No clear indications for IVC or emergent psychiatric evaluation.  Will provide Toradol, oral Ativan and reassess.  Provided reassurance Clinical Course as of 05/14/23 0426  Wynelle Link May 14, 2023  [id number] Reassessed, ate a meal tray and is resting comfortably [DS]  46 Reassessed, he is feeling better and appreciative.  Again screened regarding suicidal tendencies and he says he is okay and not having any thoughts of harming himself and is comfortable going home.  He would like to stay in the lobby until morning, which I think is reasonable.  We discussed PCP follow-up regarding his chronic conditions [DS]    Clinical Course User Index [DS] Delton Prairie, MD     FINAL CLINICAL IMPRESSION(S) / ED DIAGNOSES   Final diagnoses:  Chronic wrist pain, left     Rx / DC Orders   ED Discharge Orders     None        Note:  This document was prepared using Dragon voice recognition software and may include unintentional dictation errors.   Delton Prairie, MD 05/14/23 [phone]

## 2023-05-14 NOTE — Discharge Instructions (Signed)
Please take Tylenol and ibuprofen/Advil for your pain.  It is safe to take them together, or to alternate them every few hours.  Take up to 1000mg of Tylenol at a time, up to 4 times per day.  Do not take more than 4000 mg of Tylenol in 24 hours.  For ibuprofen, take 400-600 mg, 3 - 4 times per day.  

## 2023-05-16 ENCOUNTER — Other Ambulatory Visit: Payer: Self-pay

## 2023-05-16 ENCOUNTER — Emergency Department
Admission: EM | Admit: 2023-05-16 | Discharge: 2023-05-16 | Discharge status: Against Medical Advice | Payer: MEDICAID | Attending: Emergency Medicine | Admitting: Emergency Medicine

## 2023-05-16 DIAGNOSIS — M25531 Pain in right wrist: Secondary | ICD-10-CM | POA: Insufficient documentation

## 2023-05-16 DIAGNOSIS — Z5321 Procedure and treatment not carried out due to patient leaving prior to being seen by health care provider: Secondary | ICD-10-CM | POA: Insufficient documentation

## 2023-05-16 DIAGNOSIS — M25471 Effusion, right ankle: Secondary | ICD-10-CM | POA: Insufficient documentation

## 2023-05-16 DIAGNOSIS — M25532 Pain in left wrist: Secondary | ICD-10-CM | POA: Diagnosis present

## 2023-05-16 DIAGNOSIS — M25472 Effusion, left ankle: Secondary | ICD-10-CM | POA: Diagnosis not present

## 2023-05-16 NOTE — ED Triage Notes (Signed)
Also reports psychiatrist took pt off anti-depressant x2 weeks ago.

## 2023-05-16 NOTE — ED Triage Notes (Signed)
Pt presents via EMS c/o bilateral ankle swelling and bilateral wrist pain. 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 Reports has black mold at his home and has neurological damage due to the same. Pt reports seen here on Saturday night for same. A&O x4. Denies chest pain and SOB.

## 2023-05-16 NOTE — ED Triage Notes (Signed)
Tearful in triage when completing triage assessment.

## 2023-06-11 ENCOUNTER — Emergency Department
Admission: EM | Admit: 2023-06-11 | Discharge: 2023-06-12 | Disposition: A | Discharge status: Home / Self Care | Payer: MEDICAID | Attending: Emergency Medicine | Admitting: Emergency Medicine

## 2023-06-11 ENCOUNTER — Other Ambulatory Visit: Payer: Self-pay

## 2023-06-11 ENCOUNTER — Encounter: Payer: Self-pay | Admitting: Emergency Medicine

## 2023-06-11 DIAGNOSIS — F419 Anxiety disorder, unspecified: Secondary | ICD-10-CM | POA: Diagnosis not present

## 2023-06-11 DIAGNOSIS — F17200 Nicotine dependence, unspecified, uncomplicated: Secondary | ICD-10-CM | POA: Diagnosis present

## 2023-06-11 DIAGNOSIS — R259 Unspecified abnormal involuntary movements: Secondary | ICD-10-CM | POA: Diagnosis not present

## 2023-06-11 DIAGNOSIS — R4689 Other symptoms and signs involving appearance and behavior: Secondary | ICD-10-CM | POA: Diagnosis present

## 2023-06-11 DIAGNOSIS — F411 Generalized anxiety disorder: Secondary | ICD-10-CM | POA: Diagnosis present

## 2023-06-11 DIAGNOSIS — R739 Hyperglycemia, unspecified: Secondary | ICD-10-CM | POA: Insufficient documentation

## 2023-06-11 DIAGNOSIS — R45851 Suicidal ideations: Secondary | ICD-10-CM | POA: Insufficient documentation

## 2023-06-11 DIAGNOSIS — F17210 Nicotine dependence, cigarettes, uncomplicated: Secondary | ICD-10-CM | POA: Insufficient documentation

## 2023-06-11 DIAGNOSIS — F323 Major depressive disorder, single episode, severe with psychotic features: Secondary | ICD-10-CM | POA: Diagnosis not present

## 2023-06-11 DIAGNOSIS — F1910 Other psychoactive substance abuse, uncomplicated: Secondary | ICD-10-CM | POA: Diagnosis present

## 2023-06-11 DIAGNOSIS — F332 Major depressive disorder, recurrent severe without psychotic features: Secondary | ICD-10-CM | POA: Diagnosis present

## 2023-06-11 DIAGNOSIS — F1994 Other psychoactive substance use, unspecified with psychoactive substance-induced mood disorder: Secondary | ICD-10-CM | POA: Diagnosis present

## 2023-06-11 DIAGNOSIS — R000 Tachycardia, unspecified: Secondary | ICD-10-CM | POA: Insufficient documentation

## 2023-06-11 HISTORY — DX: Unspecified convulsions: R56.9

## 2023-06-11 LAB — CBC
HCT: 45 % (ref 39.0–52.0)
Hemoglobin: 15.2 g/dL (ref 13.0–17.0)
MCH: 30.3 pg (ref 26.0–34.0)
MCHC: 33.8 g/dL (ref 30.0–36.0)
MCV: 89.6 fL (ref 80.0–100.0)
Platelets: 202 10*3/uL (ref 150–400)
RBC: 5.02 MIL/uL (ref 4.22–5.81)
RDW: 12.1 % (ref 11.5–15.5)
WBC: 6.6 10*3/uL (ref 4.0–10.5)
nRBC: 0 % (ref 0.0–0.2)

## 2023-06-11 LAB — COMPREHENSIVE METABOLIC PANEL
ALT: 17 U/L (ref 0–44)
AST: 26 U/L (ref 15–41)
Albumin: 3.8 g/dL (ref 3.5–5.0)
Alkaline Phosphatase: 71 U/L (ref 38–126)
Anion gap: 12 (ref 5–15)
BUN: <5 mg/dL — ABNORMAL LOW (ref 6–20)
CO2: 21 mmol/L — ABNORMAL LOW (ref 22–32)
Calcium: 9.3 mg/dL (ref 8.9–10.3)
Chloride: 101 mmol/L (ref 98–111)
Creatinine, Ser: 0.85 mg/dL (ref 0.61–1.24)
GFR, Estimated: >60 mL/min (ref >60)
Glucose, Bld: 343 mg/dL — ABNORMAL HIGH (ref 70–99)
Potassium: 4.1 mmol/L (ref 3.5–5.1)
Sodium: 134 mmol/L — ABNORMAL LOW (ref 135–145)
Total Bilirubin: 0.5 mg/dL (ref 0.3–1.2)
Total Protein: 7.2 g/dL (ref 6.5–8.1)

## 2023-06-11 LAB — ETHANOL: Alcohol, Ethyl (B): <10 mg/dL (ref <10)

## 2023-06-11 LAB — URINE DRUG SCREEN, QUALITATIVE (ARMC ONLY)
Amphetamines, Ur Screen: NOT DETECTED
Barbiturates, Ur Screen: NOT DETECTED
Benzodiazepine, Ur Scrn: NOT DETECTED
Cannabinoid 50 Ng, Ur ~~LOC~~: NOT DETECTED
Cocaine Metabolite,Ur ~~LOC~~: NOT DETECTED
MDMA (Ecstasy)Ur Screen: NOT DETECTED
Methadone Scn, Ur: NOT DETECTED
Opiate, Ur Screen: NOT DETECTED
Phencyclidine (PCP) Ur S: NOT DETECTED
Tricyclic, Ur Screen: NOT DETECTED

## 2023-06-11 LAB — ACETAMINOPHEN LEVEL: Acetaminophen (Tylenol), Serum: <10 ug/mL — ABNORMAL LOW (ref 10–30)

## 2023-06-11 LAB — SALICYLATE LEVEL: Salicylate Lvl: <7 mg/dL — ABNORMAL LOW (ref 7.0–30.0)

## 2023-06-11 MED ORDER — INSULIN ASPART 100 UNIT/ML IJ SOLN
0.0000 [IU] | Freq: Three times a day (TID) | INTRAMUSCULAR | Status: DC
  Administered 2023-06-12: 8 [IU] via SUBCUTANEOUS
  Filled 2023-06-11: qty 1

## 2023-06-11 NOTE — ED Provider Notes (Signed)
[facility] Provider Note    Event Date/Time   First MD Initiated Contact with Patient 06/11/23 1721     (approximate)   History   Suicidal   HPI  Edgar White is a 57 year old male presenting to the emergency room for evaluation of suicidal ideation.  Reports ongoing suicidal thoughts for the last week.  He is to be close to his sister, but she is now cut him off which has been distressing to him.  He has a plan to shoot himself, but reports he does not have access to a gun and would not buy again.  Presents under IVC from [employer].  Does additionally report that he has had pain in his bilateral hands was told he had tendinitis, has brace over left hand.    Physical Exam   Triage Vital Signs: ED Triage Vitals  Encounter Vitals Group     BP 06/11/23 1705 (!) 167/100     Systolic BP Percentile --      Diastolic BP Percentile --      Pulse Rate 06/11/23 1705 (!) 102     Resp 06/11/23 1705 18     Temp 06/11/23 1705 98 F (36.7 C)     Temp Source 06/11/23 1705 Oral     SpO2 06/11/23 1705 99 %     Weight 06/11/23 1706 160 lb (72.6 kg)     Height 06/11/23 1706 5\' 10"  (1.778 m)     Head Circumference --      Peak Flow --      Pain Score 06/11/23 1705 10     Pain Loc --      Pain Education --      Exclude from Growth Chart --     Most recent vital signs: Vitals:   06/11/23 1705 06/11/23 1951  BP: (!) 167/100 (!) 148/96  Pulse: (!) 102 77  Resp: 18 20  Temp: 98 F (36.7 C) 98 F (36.7 C)  SpO2: 99% 99%     General: Awake, interactive  CV:  Mild tachycardia Resp:  Unlabored respirations Abd:  Nondistended Neuro:  Symmetric facial movement, fluid speech   ED Results / Procedures / Treatments   Labs (all labs ordered are listed, but only abnormal results are displayed) Labs Reviewed  COMPREHENSIVE METABOLIC PANEL - Abnormal; Notable for the following components:      Result Value   Sodium 134 (*)    CO2 21 (*)    Glucose, Bld 343  (*)    BUN <5 (*)    All other components within normal limits  SALICYLATE LEVEL - Abnormal; Notable for the following components:   Salicylate Lvl <7.0 (*)    All other components within normal limits  ACETAMINOPHEN LEVEL - Abnormal; Notable for the following components:   Acetaminophen (Tylenol), Serum <10 (*)    All other components within normal limits  ETHANOL  CBC  URINE DRUG SCREEN, QUALITATIVE ([facility] ONLY)     EKG EKG independently reviewed interpreted by myself (ER attending) demonstrates:    RADIOLOGY Imaging independently reviewed and interpreted by myself demonstrates:    PROCEDURES:  Critical Care performed: No  Procedures   MEDICATIONS ORDERED IN ED: Medications  insulin aspart (novoLOG) injection 0-15 Units (has no administration in time range)     IMPRESSION / MDM / ASSESSMENT AND PLAN / ED COURSE  I reviewed the triage vital signs and the nursing notes.  Differential diagnosis includes, but is not limited to, suicidal  ideation and, primary psychiatric disorder, substance-induced mood disorder  Patient's presentation is most consistent with acute presentation with potential threat to life or bodily function.  57 year old male presenting with suicidal ideation.  Labs notable for hyperglycemia without evidence of DKA with normal anion gap.  Will place on sliding scale insulin.  Presents under IVC with [facility].  Will consult psychiatry and TTS.  The patient has been placed in psychiatric observation due to the need to provide a safe environment for the patient while obtaining psychiatric consultation and evaluation, as well as ongoing medical and medication management to treat the patient's condition.  The patient has been placed under full IVC at this time.       FINAL CLINICAL IMPRESSION(S) / ED DIAGNOSES   Final diagnoses:  Suicidal ideation     Rx / DC Orders   ED Discharge Orders     None        Note:  This document was prepared using  Dragon voice recognition software and may include unintentional dictation errors.   [name], MD 06/11/23 [phone]

## 2023-06-11 NOTE — Consult Note (Incomplete)
Telepsych Consultation   Reason for Consult:  Psych Evaluation Referring Physician:  Dr. Rosalia Hammers Location of Patient: [facility] ER Location of Provider: Behavioral Health TTS Department  Patient Identification: Edgar White MRN:  [id number] Principal Diagnosis: Suicidal thoughts Diagnosis:  Principal Problem:   Suicidal thoughts Active Problems:   Major depressive disorder, recurrent severe without psychotic features (HCC)   Anxiety state   Substance abuse (HCC)   Substance induced mood disorder (HCC)   Tobacco use disorder   Abnormal movement   Total Time spent with patient: {Time; 15 min - 8 hours:17441}  Subjective:  Edgar White is a 57 y.o. male patient admitted with ***.   Edgar White, 57 y.o., male patient seen via tele health by this provider; chart reviewed and consulted with Dr. Rosalia Hammers on 06/11/23. Per chart review, triage note states, patient to ED via PD IVC'd from [facility]. Patient states he wants to shoot himself in the head but does not have guns to do this. When asked what is making him feel this way he states "family." Also stating he does not have a home. 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 Denies HI or hallucinations.   On evaluation Edgar White reports    During evaluation Edgar White is ***(position); ***he/she is alert/oriented x 4; calm/cooperative; and mood congruent with affect.  Patient is speaking in a clear tone at moderate volume, and normal pace; with good eye contact.  ***His/Her thought process is coherent and relevant; There is no indication that ***he/she is currently responding to internal/external stimuli or experiencing delusional thought content.  Patient denies suicidal/self-harm/homicidal ideation, psychosis, and paranoia.  Patient has remained calm throughout assessment and has answered questions appropriately.   Recommendation:  Dr. Rosalia Hammers informed of above recommendation and disposition  Past  Psychiatric History: MDD, Anxiety, substance abuse, substance induced mood disorder  Risk to Self:   Risk to Others:   Prior Inpatient Therapy:   Prior Outpatient Therapy:    Past Medical History:  Past Medical History:  Diagnosis Date  . Basal cell carcinoma 06/22/2022   L medial post shoulder, [facility]  . Basal cell carcinoma 06/22/2022   L spinal mid back, [facility]  . Basal cell carcinoma 06/22/2022   L spinal upper back, [facility]  . Basal cell carcinoma 06/22/2022   R nasal ala, MOHs completed 08/09/22  . Basal cell carcinoma 06/22/2022   R upper back, [facility]  . Depression   . Diabetes mellitus without complication (HCC)   . Seizures (HCC)     Past Surgical History:  Procedure Laterality Date  . INGUINAL HERNIA REPAIR    . LUMBAR FUSION     Family History:  Family History  Problem Relation Age of Onset  . Cancer Mother   . Basal cell carcinoma Sister   . Squamous cell carcinoma Sister    Family Psychiatric  History: unknown Social History:  Social History   Substance and Sexual Activity  Alcohol Use No   Comment: denies use     Social History   Substance and Sexual Activity  Drug Use Not Currently  . Types: Marijuana, Cocaine   Comment: marijuana currently, no recent cocaine use    Social History   Socioeconomic History  . Marital status: Single    Spouse name: Not on file  . Number of children: Not on file  . Years of education: Not on file  . Highest education level: Not on file  Occupational History  .  Not on file  Tobacco Use  . Smoking status: Every Day    Current packs/day: 1.00    Types: Cigarettes  . Smokeless tobacco: Current  Vaping Use  . Vaping status: Never Used  Substance and Sexual Activity  . Alcohol use: No    Comment: denies use  . Drug use: Not Currently    Types: Marijuana, Cocaine    Comment: marijuana currently, no recent cocaine use  . Sexual activity: Never  Other Topics Concern  . Not on file  Social History Narrative   Right  handed   Lives alone   Caffeine 3 cups daily   Social Determinants of Health   Financial Resource Strain: Not on file  Food Insecurity: No Food Insecurity (01/07/2023)   Hunger Vital Sign   . Worried About Programme researcher, broadcasting/film/video in the Last Year: Never true   . Ran Out of Food in the Last Year: Never true  Transportation Needs: No Transportation Needs (01/07/2023)   PRAPARE - Transportation   . Lack of Transportation (Medical): No   . Lack of Transportation (Non-Medical): No  Physical Activity: Not on file  Stress: Not on file  Social Connections: Not on file   Additional Social History:    Allergies:  No Known Allergies  Labs:  Results for orders placed or performed during the hospital encounter of 06/11/23 (from the past 48 hour(s))  Urine Drug Screen, Qualitative     Status: None   Collection Time: 06/11/23  5:08 PM  Result Value Ref Range   Tricyclic, Ur Screen NONE DETECTED NONE DETECTED   Amphetamines, Ur Screen NONE DETECTED NONE DETECTED   MDMA (Ecstasy)Ur Screen NONE DETECTED NONE DETECTED   Cocaine Metabolite,Ur [place] NONE DETECTED NONE DETECTED   Opiate, Ur Screen NONE DETECTED NONE DETECTED   Phencyclidine (PCP) Ur S NONE DETECTED NONE DETECTED   Cannabinoid 50 Ng, Ur [place] NONE DETECTED NONE DETECTED   Barbiturates, Ur Screen NONE DETECTED NONE DETECTED   Benzodiazepine, Ur Scrn NONE DETECTED NONE DETECTED   Methadone Scn, Ur NONE DETECTED NONE DETECTED    Comment: (NOTE) Tricyclics + metabolites, urine    Cutoff 1000 ng/mL Amphetamines + metabolites, urine  Cutoff 1000 ng/mL MDMA (Ecstasy), urine              Cutoff 500 ng/mL Cocaine Metabolite, urine          Cutoff 300 ng/mL Opiate + metabolites, urine        Cutoff 300 ng/mL Phencyclidine (PCP), urine         Cutoff 25 ng/mL Cannabinoid, urine                 Cutoff 50 ng/mL Barbiturates + metabolites, urine  Cutoff 200 ng/mL Benzodiazepine, urine              Cutoff 200 ng/mL Methadone, urine                    Cutoff 300 ng/mL  The urine drug screen provides only a preliminary, unconfirmed analytical test result and should not be used for non-medical purposes. Clinical consideration and professional judgment should be applied to any positive drug screen result due to possible interfering substances. A more specific alternate chemical method must be used in order to obtain a confirmed analytical result. Gas chromatography / mass spectrometry (GC/MS) is the preferred confirm atory method. Performed at [facility], [address] [place], [place] [address]   Comprehensive metabolic panel  Status: Abnormal   Collection Time: 06/11/23  5:10 PM  Result Value Ref Range   Sodium 134 (L) 135 - 145 mmol/L   Potassium 4.1 3.5 - 5.1 mmol/L   Chloride 101 98 - 111 mmol/L   CO2 21 (L) 22 - 32 mmol/L   Glucose, Bld 343 (H) 70 - 99 mg/dL    Comment: Glucose reference range applies only to samples taken after fasting for at least 8 hours.   BUN <5 (L) 6 - 20 mg/dL   Creatinine, Ser 2.13 0.61 - 1.24 mg/dL   Calcium 9.3 8.9 - 08.6 mg/dL   Total Protein 7.2 6.5 - 8.1 g/dL   Albumin 3.8 3.5 - 5.0 g/dL   AST 26 15 - 41 U/L   ALT 17 0 - 44 U/L   Alkaline Phosphatase 71 38 - 126 U/L   Total Bilirubin 0.5 0.3 - 1.2 mg/dL   GFR, Estimated >57 >84 mL/min    Comment: (NOTE) Calculated using the CKD-EPI Creatinine Equation (2021)    Anion gap 12 5 - 15    Comment: Performed at [facility], [address] [place], [place] [address]  Ethanol     Status: None   Collection Time: 06/11/23  5:10 PM  Result Value Ref Range   Alcohol, Ethyl (B) <10 <10 mg/dL    Comment: (NOTE) Lowest detectable limit for serum alcohol is 10 mg/dL.  For medical purposes only. Performed at [facility], [address] [place], [place] [address]   Salicylate level     Status: Abnormal   Collection Time: 06/11/23  5:10 PM  Result Value Ref Range   Salicylate Lvl <7.0 (L) 7.0 - 30.0 mg/dL     Comment: Performed at [facility], [address] [place], [place] [address]  Acetaminophen level     Status: Abnormal   Collection Time: 06/11/23  5:10 PM  Result Value Ref Range   Acetaminophen (Tylenol), Serum <10 (L) 10 - 30 ug/mL    Comment: (NOTE) Therapeutic concentrations vary significantly. A range of 10-30 ug/mL  may be an effective concentration for many patients. However, some  are best treated at concentrations outside of this range. Acetaminophen concentrations >150 ug/mL at 4 hours after ingestion  and >50 ug/mL at 12 hours after ingestion are often associated with  toxic reactions.  Performed at [facility], [address] [place], [place] [address]   cbc     Status: None   Collection Time: 06/11/23  5:10 PM  Result Value Ref Range   WBC 6.6 4.0 - 10.5 K/uL   RBC 5.02 4.22 - 5.81 MIL/uL   Hemoglobin 15.2 13.0 - 17.0 g/dL   HCT 53.6 64.4 - 03.4 %   MCV 89.6 80.0 - 100.0 fL   MCH 30.3 26.0 - 34.0 pg   MCHC 33.8 30.0 - 36.0 g/dL   RDW 74.2 59.5 - 63.8 %   Platelets 202 150 - 400 K/uL   nRBC 0.0 0.0 - 0.2 %    Comment: Performed at [facility], [address] [place], [place] [address]    Medications:  Current Facility-Administered Medications  Medication Dose Route Frequency Provider Last Rate Last Admin  . [START ON 06/12/2023] insulin aspart (novoLOG) injection 0-15 Units  0-15 Units Subcutaneous TID WC [name], MD       Current Outpatient Medications  Medication Sig Dispense Refill  . atorvastatin (LIPITOR) 20 MG tablet Take 1 tablet (20 mg total) by mouth  daily. 30 tablet 2  . clonazePAM (KLONOPIN) 0.5 MG tablet Take 1 tablet (0.5 mg total) by mouth 2 (two) times daily. 60 tablet 0  . irbesartan (AVAPRO) 75 MG tablet Take 1 tablet (75 mg total) by mouth daily. 30 tablet 2  . metformin (FORTAMET) 500 MG (OSM) 24 hr tablet Take 500 mg by mouth daily with breakfast.    . venlafaxine XR (EFFEXOR-XR) 150 MG 24 hr capsule  Take 1 capsule (150 mg total) by mouth daily with breakfast. 30 capsule 1    Musculoskeletal: Strength & Muscle Tone: {desc; muscle tone:32375} Gait & Station: {PE GAIT ED ZOXW:96045} Patient leans: {Patient Leans:21022755}  Psychiatric Specialty Exam:  Presentation  General Appearance: No data recorded Eye Contact:No data recorded Speech:No data recorded Speech Volume:No data recorded Handedness:No data recorded  Mood and Affect  Mood:No data recorded Affect:No data recorded  Thought Process  Thought Processes:No data recorded Descriptions of Associations:No data recorded Orientation:No data recorded Thought Content:No data recorded History of Schizophrenia/Schizoaffective disorder:No data recorded Duration of Psychotic Symptoms:No data recorded Hallucinations:No data recorded Ideas of Reference:No data recorded Suicidal Thoughts:No data recorded Homicidal Thoughts:No data recorded  Sensorium  Memory:No data recorded Judgment:No data recorded Insight:No data recorded  Executive Functions  Concentration:No data recorded Attention Span:No data recorded Recall:No data recorded Fund of Knowledge:No data recorded Language:No data recorded  Psychomotor Activity  Psychomotor Activity:No data recorded  Assets  Assets:No data recorded  Sleep  Sleep:No data recorded   Physical Exam: Physical Exam Vitals and nursing note reviewed.    ROS Blood pressure (!) 148/96, pulse 77, temperature 98 F (36.7 C), temperature source Oral, resp. rate 20, height 5\' 10"  (1.778 m), weight 72.6 kg, SpO2 99%. Body mass index is 22.96 kg/m.  Treatment Plan Summary: {CHL [facility] MD [place] WUJW:119147829}  Disposition: {CHL [facility] Consult Plan:20772}  This service was provided via telemedicine using a 2-way, interactive audio and video technology.  Jearld Lesch, NP 06/11/2023 9:34 PM

## 2023-06-11 NOTE — ED Notes (Addendum)
Patient belongings:  1 green shirt 1 camo shorts 2 green shoes 1 red bracelet  1 phone  1 phone charger 2 socks 1  Edgar White belt 1 underwear

## 2023-06-11 NOTE — ED Triage Notes (Signed)
Patient to ED via PD IVC'd from [facility]. Patient states he wants to shoot himself in the head but does not have guns to do this. When asked what is making him feel this way he states "family." Also stating he does not have a home. 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 Denies HI or hallucinations.

## 2023-06-11 NOTE — ED Provider Triage Note (Signed)
Emergency Medicine Provider Triage Evaluation Note  Nelse Hoague , a 57 y.o. male  was evaluated in triage.  Pt complains of SI. PMH of depression, substance abuse, seizures, diabetes. Patient is brought over from the [facility] across the street.  Review of Systems  Positive: Pain in fingers and hands bilaterally Negative: HI, hallucinations  Physical Exam  BP (!) 167/100 (BP Location: Right Arm)   Pulse (!) 102   Temp 98 F (36.7 C) (Oral)   Resp 18   SpO2 99%  Gen:   Awake, no distress   Resp:  Normal effort  MSK:   Moves extremities without difficulty , thumb spica brace on left wrist Other:    Medical Decision Making  Medically screening exam initiated at 5:05 PM.  Appropriate orders placed.  [name] was informed that the remainder of the evaluation will be completed by another provider, this initial triage assessment does not replace that evaluation, and the importance of remaining in the ED until their evaluation is complete.     Cameron Ali, PA-C 06/11/23 1709

## 2023-06-12 ENCOUNTER — Encounter: Payer: Self-pay | Admitting: Family Medicine

## 2023-06-12 ENCOUNTER — Other Ambulatory Visit: Payer: Self-pay

## 2023-06-12 ENCOUNTER — Inpatient Hospital Stay
Admission: AD | Admit: 2023-06-12 | Discharge: 2023-06-17 | DRG: 885 | Disposition: A | Discharge status: Home / Self Care | Payer: MEDICAID | Source: Other Acute Inpatient Hospital | Attending: Psychiatry | Admitting: Psychiatry

## 2023-06-12 DIAGNOSIS — Z5900 Homelessness unspecified: Secondary | ICD-10-CM

## 2023-06-12 DIAGNOSIS — Z981 Arthrodesis status: Secondary | ICD-10-CM | POA: Diagnosis not present

## 2023-06-12 DIAGNOSIS — F332 Major depressive disorder, recurrent severe without psychotic features: Secondary | ICD-10-CM | POA: Diagnosis not present

## 2023-06-12 DIAGNOSIS — Z79899 Other long term (current) drug therapy: Secondary | ICD-10-CM

## 2023-06-12 DIAGNOSIS — F323 Major depressive disorder, single episode, severe with psychotic features: Secondary | ICD-10-CM | POA: Insufficient documentation

## 2023-06-12 DIAGNOSIS — Z7984 Long term (current) use of oral hypoglycemic drugs: Secondary | ICD-10-CM | POA: Diagnosis not present

## 2023-06-12 DIAGNOSIS — E119 Type 2 diabetes mellitus without complications: Secondary | ICD-10-CM | POA: Diagnosis present

## 2023-06-12 DIAGNOSIS — Z7952 Long term (current) use of systemic steroids: Secondary | ICD-10-CM

## 2023-06-12 DIAGNOSIS — F333 Major depressive disorder, recurrent, severe with psychotic symptoms: Secondary | ICD-10-CM | POA: Diagnosis present

## 2023-06-12 DIAGNOSIS — F1910 Other psychoactive substance abuse, uncomplicated: Secondary | ICD-10-CM | POA: Diagnosis present

## 2023-06-12 DIAGNOSIS — Z820 Family history of epilepsy and other diseases of the nervous system: Secondary | ICD-10-CM

## 2023-06-12 DIAGNOSIS — R45850 Homicidal ideations: Secondary | ICD-10-CM | POA: Diagnosis present

## 2023-06-12 DIAGNOSIS — F17210 Nicotine dependence, cigarettes, uncomplicated: Secondary | ICD-10-CM | POA: Diagnosis present

## 2023-06-12 DIAGNOSIS — Z8673 Personal history of transient ischemic attack (TIA), and cerebral infarction without residual deficits: Secondary | ICD-10-CM | POA: Diagnosis not present

## 2023-06-12 DIAGNOSIS — R45851 Suicidal ideations: Secondary | ICD-10-CM | POA: Diagnosis present

## 2023-06-12 DIAGNOSIS — F1510 Other stimulant abuse, uncomplicated: Secondary | ICD-10-CM | POA: Diagnosis present

## 2023-06-12 DIAGNOSIS — G40909 Epilepsy, unspecified, not intractable, without status epilepticus: Secondary | ICD-10-CM | POA: Diagnosis present

## 2023-06-12 DIAGNOSIS — Z791 Long term (current) use of non-steroidal anti-inflammatories (NSAID): Secondary | ICD-10-CM | POA: Diagnosis not present

## 2023-06-12 LAB — CBG MONITORING, ED
Glucose-Capillary: 117 mg/dL — ABNORMAL HIGH (ref 70–99)
Glucose-Capillary: 266 mg/dL — ABNORMAL HIGH (ref 70–99)
Glucose-Capillary: 114 mg/dL — ABNORMAL HIGH (ref 70–99)

## 2023-06-12 MED ORDER — DIPHENHYDRAMINE HCL 50 MG/ML IJ SOLN: 50.0000 mg | Freq: Three times a day (TID) | INTRAMUSCULAR | Status: DC | PRN

## 2023-06-12 MED ORDER — QUETIAPINE FUMARATE 25 MG PO TABS: 50.0000 mg | ORAL_TABLET | Freq: Every day | ORAL | Status: DC

## 2023-06-12 MED ORDER — ACETAMINOPHEN 325 MG PO TABS: 650.0000 mg | ORAL_TABLET | Freq: Four times a day (QID) | ORAL | Status: DC | PRN

## 2023-06-12 MED ORDER — CLONAZEPAM 0.5 MG PO TABS: 1.0000 mg | ORAL_TABLET | Freq: Two times a day (BID) | ORAL | Status: DC

## 2023-06-12 MED ORDER — MAGNESIUM HYDROXIDE 400 MG/5ML PO SUSP: 30.0000 mL | Freq: Every day | ORAL | Status: DC | PRN

## 2023-06-12 MED ORDER — INSULIN ASPART 100 UNIT/ML IJ SOLN
0.0000 [IU] | Freq: Three times a day (TID) | INTRAMUSCULAR | Status: DC
  Administered 2023-06-13: 3 [IU] via SUBCUTANEOUS
  Administered 2023-06-13 – 2023-06-15 (×3): 5 [IU] via SUBCUTANEOUS
  Administered 2023-06-15 (×2): 8 [IU] via SUBCUTANEOUS
  Administered 2023-06-16: 5 [IU] via SUBCUTANEOUS
  Administered 2023-06-16 (×2): 8 [IU] via SUBCUTANEOUS
  Administered 2023-06-17: 5 [IU] via SUBCUTANEOUS
  Filled 2023-06-12 (×9): qty 1

## 2023-06-12 MED ORDER — LORAZEPAM 2 MG PO TABS
2.0000 mg | ORAL_TABLET | Freq: Once | ORAL | Status: AC
  Administered 2023-06-12: 2 mg via ORAL
  Filled 2023-06-12: qty 1

## 2023-06-12 MED ORDER — METFORMIN HCL ER 500 MG PO TB24
500.0000 mg | ORAL_TABLET | Freq: Every day | ORAL | Status: DC
  Administered 2023-06-13 – 2023-06-17 (×5): 500 mg via ORAL
  Filled 2023-06-12 (×5): qty 1

## 2023-06-12 MED ORDER — ATORVASTATIN CALCIUM 20 MG PO TABS
20.0000 mg | ORAL_TABLET | Freq: Every day | ORAL | Status: DC
  Administered 2023-06-13 – 2023-06-17 (×5): 20 mg via ORAL
  Filled 2023-06-12 (×5): qty 1

## 2023-06-12 MED ORDER — HALOPERIDOL LACTATE 5 MG/ML IJ SOLN: 5.0000 mg | Freq: Three times a day (TID) | INTRAMUSCULAR | Status: DC | PRN

## 2023-06-12 MED ORDER — LORAZEPAM 2 MG PO TABS
2.0000 mg | ORAL_TABLET | Freq: Three times a day (TID) | ORAL | Status: DC | PRN
  Administered 2023-06-13: 2 mg via ORAL
  Filled 2023-06-12: qty 1

## 2023-06-12 MED ORDER — HALOPERIDOL 5 MG PO TABS
5.0000 mg | ORAL_TABLET | Freq: Three times a day (TID) | ORAL | Status: DC | PRN
  Administered 2023-06-13: 5 mg via ORAL
  Filled 2023-06-12: qty 1

## 2023-06-12 MED ORDER — ALUM & MAG HYDROXIDE-SIMETH 200-200-20 MG/5ML PO SUSP
30.0000 mL | ORAL | Status: DC | PRN
  Administered 2023-06-15 – 2023-06-16 (×2): 30 mL via ORAL
  Filled 2023-06-12 (×3): qty 30

## 2023-06-12 MED ORDER — ATORVASTATIN CALCIUM 20 MG PO TABS
20.0000 mg | ORAL_TABLET | Freq: Every day | ORAL | Status: DC
  Administered 2023-06-12: 20 mg via ORAL
  Filled 2023-06-12: qty 1

## 2023-06-12 MED ORDER — METFORMIN HCL ER 500 MG PO TB24
500.0000 mg | ORAL_TABLET | Freq: Every day | ORAL | Status: DC
  Administered 2023-06-12: 500 mg via ORAL
  Filled 2023-06-12: qty 1

## 2023-06-12 MED ORDER — IRBESARTAN 75 MG PO TABS
75.0000 mg | ORAL_TABLET | Freq: Every day | ORAL | Status: DC
  Administered 2023-06-12: 75 mg via ORAL
  Filled 2023-06-12: qty 1

## 2023-06-12 MED ORDER — DIPHENHYDRAMINE HCL 25 MG PO CAPS
50.0000 mg | ORAL_CAPSULE | Freq: Three times a day (TID) | ORAL | Status: DC | PRN
  Administered 2023-06-13: 50 mg via ORAL
  Filled 2023-06-12: qty 2

## 2023-06-12 MED ORDER — IRBESARTAN 75 MG PO TABS
75.0000 mg | ORAL_TABLET | Freq: Every day | ORAL | Status: DC
  Administered 2023-06-13 – 2023-06-17 (×5): 75 mg via ORAL
  Filled 2023-06-12 (×5): qty 1

## 2023-06-12 MED ORDER — CLONAZEPAM 0.25 MG PO TBDP
1.0000 mg | ORAL_TABLET | Freq: Two times a day (BID) | ORAL | Status: DC
  Administered 2023-06-13: 1 mg via ORAL
  Filled 2023-06-12: qty 4

## 2023-06-12 MED ORDER — CLONAZEPAM 0.5 MG PO TABS
0.5000 mg | ORAL_TABLET | Freq: Two times a day (BID) | ORAL | Status: DC
  Administered 2023-06-12: 0.5 mg via ORAL
  Filled 2023-06-12: qty 1

## 2023-06-12 MED ORDER — LORAZEPAM 2 MG/ML IJ SOLN: 2.0000 mg | Freq: Three times a day (TID) | INTRAMUSCULAR | Status: DC | PRN

## 2023-06-12 NOTE — Progress Notes (Signed)
Patient admitted from [facility] - ED, report received from Amy, RN. Pt irritable during admission. Pt endorses passive SI, verbally contracts for safety with this Clinical research associate. Pt stated he was having problems with his family which is causing him to have SI. Pt known from previous admissions. Pt oriented to the unit and his room. Pt refused his scheduled medications tonight stating, "I am not supposed to take these, I want to wait and talk to the doctor tomorrow before I take these." Pt given education, still refused. Pt being monitored Q 15 minutes for safety per unit protocol, remains safe on the unit. Pt skin assessment completed with Marylu Lund, RN, no abnormalities or contraband found.

## 2023-06-12 NOTE — ED Notes (Signed)
Ivc /admit to behavioral med

## 2023-06-12 NOTE — ED Notes (Signed)
Pt expressing much frustration due the fact that he cannot "go for a walk to the snack machine". RN offered cup of water multiple times, pt declined and continued to escalate in verbal threats. Pt was provided pillow and RN attempted to provide pt emotional support and verbal de-escalation. Door shut to encourage decrease of environmental stimulus, as pt remains agitated. Blinds open.

## 2023-06-12 NOTE — ED Provider Notes (Signed)
Emergency Medicine Observation Re-evaluation Note  Edgar White is a 57 y.o. male, seen on rounds today.  Pt initially presented to the ED for complaints of Suicidal Currently, the patient is resting.  Physical Exam  BP (!) 148/96 (BP Location: Right Arm)   Pulse 77   Temp 98 F (36.7 C) (Oral)   Resp 20   Ht 1.778 m (5\' 10" )   Wt 72.6 kg   SpO2 99%   BMI 22.96 kg/m  Physical Exam Gen:  No acute distress Resp:  Breathing easily and comfortably, no accessory muscle usage Neuro:  Moving all four extremities, no gross focal neuro deficits Psych:  Resting currently, calm when awake after requiring Ativan earlier tonight ED Course / MDM  EKG:   I have reviewed the labs performed to date as well as medications administered while in observation.  Recent changes in the last 24 hours include initial assessment.  Plan  Current plan is for psych disposition.    Loleta Rose, MD 06/12/23 [phone]
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
## 2023-06-12 NOTE — Tx Team (Signed)
Initial Treatment Plan 06/12/2023 8:43 PM [name] ZOX:[id number]    PATIENT STRESSORS: Health problems   Substance abuse     PATIENT STRENGTHS: Capable of independent living  Communication skills    PATIENT IDENTIFIED PROBLEMS: Suicidal Ideation  Substance abuse  Medical conditions                 DISCHARGE CRITERIA:  Improved stabilization in mood, thinking, and/or behavior  PRELIMINARY DISCHARGE PLAN: Outpatient therapy  PATIENT/FAMILY INVOLVEMENT: This treatment plan has been presented to and reviewed with the patient, Edgar White, and/or family member, .  The patient and family have been given the opportunity to ask questions and make suggestions.  Shelia Media, RN 06/12/2023, 8:43 PM

## 2023-06-12 NOTE — ED Notes (Signed)
Hospital meal provided.  100% consumed, pt tolerated w/o complaints.  Waste discarded appropriately.   

## 2023-06-12 NOTE — ED Notes (Signed)
Report called to [facility]

## 2023-06-12 NOTE — ED Notes (Signed)
IVC  GOING  TO  [facility]  TONIGHT 

## 2023-06-12 NOTE — ED Notes (Signed)
Ivc /psych consult pending 

## 2023-06-12 NOTE — Group Note (Unsigned)
Date:  06/12/2023 Time:  9:25 PM  Group Topic/Focus:  Wrap-Up Group:   The focus of this group is to help patients review their daily goal of treatment and discuss progress on daily workbooks.     Participation Level:  {[facility] PARTICIPATION XLKGM:01027}  Participation Quality:  {B[facility] PARTICIPATION QUALITY:22265}  Affect:  {[facility] AFFECT:22266}  Cognitive:  {[facility] COGNITIVE:22267}  Insight: {BHH Insight2:20797}  Engagement in Group:  {B[facility] ENGAGEMENT IN OZDGU:44034}  Modes of Intervention:  {BHH MODES OF INTERVENTION:22269}  Additional Comments:  ***  Jeanise Durfey 06/12/2023, 9:25 PM

## 2023-06-12 NOTE — ED Notes (Signed)
Meds given.   

## 2023-06-12 NOTE — ED Notes (Signed)
Pt came out of room stating he was going to run.  Security escorted pt back to room  pt asking for psych to see him now.  Md aware.

## 2023-06-12 NOTE — Group Note (Signed)
Date:  06/12/2023 Time:  9:35 PM  Group Topic/Focus:  Wrap-Up Group:   The focus of this group is to help patients review their daily goal of treatment and discuss progress on daily workbooks.    Participation Level:  Did Not Attend  Participation Quality:   none  Affect:   none  Cognitive:   none  Insight: None  Engagement in Group:   none  Modes of Intervention:   none  Additional Comments:  none  Ismael Treptow 06/12/2023, 9:35 PM

## 2023-06-12 NOTE — ED Notes (Signed)
C/o anxiety, pacing in room

## 2023-06-12 NOTE — ED Notes (Signed)
Pt asked to shower. Pt was given clothes, soap, towels etc. To complete the shower.

## 2023-06-12 NOTE — BH Assessment (Addendum)
Patient is to be admitted to  [facility] on Today, 06/12/23 pending EKG.   Patient assigned to room 306 Accepting physician is Dr. Marlou Porch Representative was [facility].   ER Staff is aware of it:  [facility] ER Secretary  Dr. Roxan Hockey ER MD  Revonda Standard Patient's Nurse

## 2023-06-12 NOTE — Plan of Care (Signed)
  Problem: Safety: Goal: Ability to remain free from injury will improve Outcome: Progressing   

## 2023-06-12 NOTE — ED Provider Notes (Signed)
-----------------------------------------   12:34 AM on 06/12/2023 -----------------------------------------  Patient is becoming agitated and saying that he needs something to calm him down.  He states he is having a seizure but there is no indication of seizure-like activity.  I ordered Ativan 2 mg p.o. as a calming agent which she is excepting willingly.   Loleta Rose, MD 06/12/23 [phone]

## 2023-06-12 NOTE — ED Notes (Signed)
Breakfast given.  

## 2023-06-13 LAB — LIPID PANEL
Cholesterol: 157 mg/dL (ref 0–200)
HDL: 31 mg/dL — ABNORMAL LOW (ref >40)
LDL Cholesterol: 101 mg/dL — ABNORMAL HIGH (ref 0–99)
Total CHOL/HDL Ratio: 5.1 {ratio}
Triglycerides: 124 mg/dL (ref <150)
VLDL: 25 mg/dL (ref 0–40)

## 2023-06-13 LAB — HEMOGLOBIN A1C
Hgb A1c MFr Bld: 8.9 % — ABNORMAL HIGH (ref 4.8–5.6)
Mean Plasma Glucose: 208.73 mg/dL

## 2023-06-13 LAB — GLUCOSE, CAPILLARY
Glucose-Capillary: 167 mg/dL — ABNORMAL HIGH (ref 70–99)
Glucose-Capillary: 213 mg/dL — ABNORMAL HIGH (ref 70–99)
Glucose-Capillary: 233 mg/dL — ABNORMAL HIGH (ref 70–99)

## 2023-06-13 MED ORDER — CLONAZEPAM 0.25 MG PO TBDP
0.5000 mg | ORAL_TABLET | Freq: Two times a day (BID) | ORAL | Status: DC
  Administered 2023-06-13 – 2023-06-16 (×5): 0.5 mg via ORAL
  Filled 2023-06-13 (×6): qty 2

## 2023-06-13 MED ORDER — PALIPERIDONE ER 3 MG PO TB24
3.0000 mg | ORAL_TABLET | Freq: Every day | ORAL | Status: DC
  Administered 2023-06-13 – 2023-06-17 (×5): 3 mg via ORAL
  Filled 2023-06-13 (×5): qty 1

## 2023-06-13 NOTE — Group Note (Signed)
Date:  06/13/2023 Time:  6:40 PM  Group Topic/Focus:  Healthy Communication:   The focus of this group is to discuss communication, barriers to communication, as well as healthy ways to communicate with others.    Participation Level:  Minimal  Participation Quality:  Inattentive, Redirectable, and Resistant  Affect:  Angry and Defensive  Cognitive:  Disorganized and Lacking  Insight: Lacking and Limited  Engagement in Group:  Lacking and Limited  Modes of Intervention:  Limit-setting  Additional Comments:    Abena Erdman 06/13/2023, 6:40 PM

## 2023-06-13 NOTE — Progress Notes (Signed)
   06/13/23 1116  Psych Admission Type (Psych Patients Only)  Admission Status Involuntary  Psychosocial Assessment  Patient Complaints Depression  Eye Contact Fair  Facial Expression Anxious  Affect Anxious  Speech Soft  Interaction Minimal  Motor Activity Slow  Appearance/Hygiene In scrubs  Behavior Characteristics Anxious;Cooperative  Mood Depressed  Thought Process  Coherency Circumstantial  Content Preoccupation  Delusions None reported or observed  Perception WDL  Hallucination None reported or observed  Judgment Impaired  Confusion None  Danger to Self  Agreement Not to Harm Self Yes  Description of Agreement verbal  Danger to Others  Danger to Others None reported or observed   Patient confused with unstable gait. 1:1 observation for risk of falls and ADLs needs. No hyper/hypoglycemic symptoms noted.

## 2023-06-13 NOTE — Group Note (Signed)
[facility] LCSW Group Therapy Note   Group Date: 06/13/2023 Start Time: 1335 End Time: 1405   Type of Therapy/Topic:  Group Therapy:  Emotion Regulation  Participation Level:  Did Not Attend   Mood:  Description of Group:    The purpose of this group is to assist patients in learning to regulate negative emotions and experience positive emotions. Patients will be guided to discuss ways in which they have been vulnerable to their negative emotions. These vulnerabilities will be juxtaposed with experiences of positive emotions or situations, and patients challenged to use positive emotions to combat negative ones. Special emphasis will be placed on coping with negative emotions in conflict situations, and patients will process healthy conflict resolution skills.  Therapeutic Goals: Patient will identify two positive emotions or experiences to reflect on in order to balance out negative emotions:  Patient will label two or more emotions that they find the most difficult to experience:  Patient will be able to demonstrate positive conflict resolution skills through discussion or role plays:   Summary of Patient Progress:   Patient did not attend    Therapeutic Modalities:   Cognitive Behavioral Therapy Feelings Identification Dialectical Behavioral Therapy   Whitney Post, LCSWA
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
## 2023-06-13 NOTE — Progress Notes (Signed)
Nursing Shift Note:  1900-0700  Attended Evening Group: yes Medication Compliant:  yes Behavior: anxious, tearful Sleep Quality: good Significant Changes: continues on 1:1 sitter order.     06/13/23 2300  Psych Admission Type (Psych Patients Only)  Admission Status Involuntary  Psychosocial Assessment  Patient Complaints Depression  Eye Contact Fair  Facial Expression Anxious  Affect Anxious  Speech Soft  Interaction Minimal  Motor Activity Slow  Appearance/Hygiene In scrubs  Behavior Characteristics Anxious  Mood Depressed  Thought Process  Coherency WDL  Content Preoccupation  Delusions None reported or observed  Perception WDL  Hallucination None reported or observed  Judgment Impaired  Confusion None  Danger to Self  Current suicidal ideation? Denies  Danger to Others  Danger to Others None reported or observed

## 2023-06-13 NOTE — Plan of Care (Signed)
  Problem: Activity: Goal: Sleeping patterns will improve Outcome: Progressing   Problem: Coping: Goal: Ability to demonstrate self-control will improve Outcome: Progressing   

## 2023-06-13 NOTE — H&P (Addendum)
Psychiatric Admission Assessment Adult  Patient Identification: Edgar White MRN:  [id number] Date of Evaluation:  06/13/2023 Chief Complaint:  MDD (major depressive disorder), single episode, severe with psychotic features (HCC) [F32.3] Principal Diagnosis: MDD (major depressive disorder), single episode, severe with psychotic features (HCC) Diagnosis:  Principal Problem:   MDD (major depressive disorder), single episode, severe with psychotic features (HCC)  History of Present Illness:  57 y/o male w/ history of MDD severe with psychotic features, polysubstance abuse, CVA, DM2 admitted to inpatient psychiatry from [facility] after presenting bizarre, paranoid, disorganized, endorsing active suicidal ideations.   Pt is a poor historian. Unable to provide much history. He is alert and oriented to self (name, date of birth, age), city [facility]), month (October) and year (2024). Reports he lives by himself and has been homeless since September when he was evicted by his landlord. States he was evicted because "they think they're above the law". Denies suicidal ideations. Endorses passive suicidal ideations towards his brother because he "threatened my life". Denies plan or intent. Denies auditory visual hallucinations or paranoia. He reports black mold has been affecting his mental state. He also reports his brother is attempting to kill him. He continues to fall asleep on assessment, and with attempts to wake him, states "I've been doped up, I can't talk", and proceeds to fall back asleep. Per chart review, pt received agitation medication - benadryl 50mg  oral, haldol 5mg  oral, ativan 2mg  oral this morning at 0509.   Per NP note from the ED on 06/12/2023: HPI: On evaluation Edgar White , with a mental health history of MDD, methamphetamine abuse, seizure disorder, depression, anxiety, diabetes, endorses to this writer that he has been having suicidal thoughts for over 1 week.  He is guarded  and telling this Clinical research associate what his plan would be however to the ED PA that he had had a plan of shooting himself in the head but currently did not have access to a gun.  He had also reported that he attempted to jump from a building approximately 1 week ago per IVC petition.  He is not disclosing this information to this Clinical research associate although endorses active suicidal ideations and inability to keep himself safe.  He reports that back in April while living in his apartment it was determined that there was black mold present and he states that the black mold had called him medical problems affecting his" brain" and causing "disease of the brain".  He further endorses that he adamantly feels that his brother-in-law is trying to harm his sister and is out to kill him as well.  He reports that his sister has blocked him from being able to call her phone as he endorses that he sent her to songs over the last 2 weeks and after the last text message sent, patient's sister replied that she did not want to speak with him any longer. Per patient, he feels his sister life is in danger and no one will listen to him. He tells this Clinical research associate that his brother in law is hurting her and he called the police to complete a welfare check and no one listened to him. Patient tells this Clinical research associate that he is unable to contract for safety and doesn't feel safe leaving the hospital.  Alternatively he tells this Clinical research associate he wants to get on a train and go to [place] however reports that he has no family or friends there.  Due to patient's current mental status he is unable to  reliably contract for safety and meets [facility] IVC criteria due to current mental health status.   During evaluation Edgar White is sitting and appears anxious, tearful, and disorganized. His affect is congruent with mood. He has bizarre, paranoid and disorganized thoughts. He doesn't appear to be responding to  internal/external stimuli. He has delusional thoughts and  fixated on his brother in law harming him. Patient endorses active suicidal ideations and has reported plan to shoot himself and jump from a building, although only disclosed SI to this Clinical research associate.   Associated Signs/Symptoms: Depression Symptoms:   unable to assess due to patient presentation (Hypo) Manic Symptoms:   unable to assess due to patient presentation Anxiety Symptoms:   unable to assess due to patient presentation Psychotic Symptoms:   unable to assess due to patient presentation PTSD Symptoms: Unable to assess due to patient presentation Total Time spent with patient: 30 minutes  Past Psychiatric History: MDD severe with psychotic features, polysubstance abuse  Is the patient at risk to self? Pt denies suicidal ideations. Had endorses suicidal ideations in the emergency department prior to transfer. Has the patient been a risk to self in the past 6 months? No Has the patient been a risk to self within the distant past? Yes Is the patient a risk to others? Pt endorses passive suicidal ideations towards his brother.  Has the patient been a risk to others in the past 6 months? No.  Has the patient been a risk to others within the distant past? No.   [place] Scale:  Flowsheet Row Admission (Current) from 06/12/2023 in [facility] INPATIENT BEH[facility] MEDICINE ED from 06/11/2023 in [facility] Emergency Department at [facility] ED from 05/16/2023 in [facility] Emergency Department at [facility]  C-SSRS RISK CATEGORY Low Risk High Risk No Risk        Prior Inpatient Therapy: Yes.    Prior Outpatient Therapy: Yes  Alcohol Screening: 1. How often do you have a drink containing alcohol?: Never 2. How many drinks containing alcohol do you have on a typical day when you are drinking?: 1 or 2 3. How often do you have six or more drinks on one occasion?: Never AUDIT-C Score: 0 4. How often during the last year have you found that you were not able to stop drinking once you had  started?: Never 5. How often during the last year have you failed to do what was normally expected from you because of drinking?: Never 6. How often during the last year have you needed a first drink in the morning to get yourself going after a heavy drinking session?: Never 7. How often during the last year have you had a feeling of guilt of remorse after drinking?: Never 8. How often during the last year have you been unable to remember what happened the night before because you had been drinking?: Never 9. Have you or someone else been injured as a result of your drinking?: No 10. Has a relative or friend or a doctor or another health worker been concerned about your drinking or suggested you cut down?: No Alcohol Use Disorder Identification Test Final Score (AUDIT): 0 Substance Abuse History in the last 12 months:  Yes.   Consequences of Substance Abuse: Unable to assess Previous Psychotropic Medications: Yes  Psychological Evaluations:  Unknown Past Medical History:  Past Medical History:  Diagnosis Date   Basal cell carcinoma 06/22/2022   L medial post shoulder, EDC   Basal cell carcinoma 06/22/2022  L spinal mid back, [facility]   Basal cell carcinoma 06/22/2022   L spinal upper back, [facility]   Basal cell carcinoma 06/22/2022   R nasal ala, MOHs completed 08/09/22   Basal cell carcinoma 06/22/2022   R upper back, [facility]   Depression    Diabetes mellitus without complication (HCC)    Seizures (HCC)     Past Surgical History:  Procedure Laterality Date   INGUINAL HERNIA REPAIR     LUMBAR FUSION     Family History:  Family History  Problem Relation Age of Onset   Cancer Mother    Basal cell carcinoma Sister    Squamous cell carcinoma Sister    Family Psychiatric  History: Unable to assess Tobacco Screening:  Social History   Tobacco Use  Smoking Status Every Day   Current packs/day: 1.00   Average packs/day: 1 pack/day for 25.8 years (25.8 ttl pk-yrs)   Types: Cigarettes    Start date: 1999  Smokeless Tobacco Current    BH Tobacco Counseling     Are you interested in Tobacco Cessation Medications?  Yes, implement Nicotene Replacement Protocol Counseled patient on smoking cessation:  Yes Reason Tobacco Screening Not Completed: No value filed.       Social History:  Social History   Substance and Sexual Activity  Alcohol Use No   Comment: denies use     Social History   Substance and Sexual Activity  Drug Use Not Currently   Types: Marijuana, Cocaine   Comment: marijuana currently, no recent cocaine use    Allergies:  No Known Allergies Lab Results:  Results for orders placed or performed during the hospital encounter of 06/12/23 (from the past 48 hour(s))  Lipid panel     Status: Abnormal   Collection Time: 06/13/23  9:20 AM  Result Value Ref Range   Cholesterol 157 0 - 200 mg/dL   Triglycerides 469 <629 mg/dL   HDL 31 (L) >52 mg/dL   Total CHOL/HDL Ratio 5.1 RATIO   VLDL 25 0 - 40 mg/dL   LDL Cholesterol 841 (H) 0 - 99 mg/dL    Comment:        Total Cholesterol/HDL:CHD Risk Coronary Heart Disease Risk Table                     Men   Women  1/2 Average Risk   3.4   3.3  Average Risk       5.0   4.4  2 X Average Risk   9.6   7.1  3 X Average Risk  23.4   11.0        Use the calculated Patient Ratio above and the CHD Risk Table to determine the patient's CHD Risk.        ATP III CLASSIFICATION (LDL):  <100     mg/dL   Optimal  324-401  mg/dL   Near or Above                    Optimal  130-159  mg/dL   Borderline  027-253  mg/dL   High  >664     mg/dL   Very High Performed at [facility], [address] [place], [place] [address]     Blood Alcohol level:  Lab Results  Component Value Date   [facility] <10 06/11/2023   ETH <10 02/10/2023    Metabolic Disorder Labs:  Lab Results  Component Value Date   HGBA1C 9.9 (  H) 04/05/2023   MPG 231.69 01/07/2023   MPG 128.37 06/20/2021   No results found for:  "PROLACTIN" Lab Results  Component Value Date   CHOL 157 06/13/2023   TRIG 124 06/13/2023   HDL 31 (L) 06/13/2023   CHOLHDL 5.1 06/13/2023   VLDL 25 06/13/2023   LDLCALC 101 (H) 06/13/2023   LDLCALC 161 (H) 01/08/2023    Current Medications: Current Facility-Administered Medications  Medication Dose Route Frequency Provider Last Rate Last Admin   acetaminophen (TYLENOL) tablet 650 mg  650 mg Oral Q6H PRN Bing Neighbors, NP       alum & mag hydroxide-simeth (MAALOX/MYLANTA) 200-200-20 MG/5ML suspension 30 mL  30 mL Oral Q4H PRN Bing Neighbors, NP       atorvastatin (LIPITOR) tablet 20 mg  20 mg Oral Daily Bing Neighbors, NP   20 mg at 06/13/23 1610   clonazePAM (KLONOPIN) disintegrating tablet 0.5 mg  0.5 mg Oral BID Lauree Chandler, NP       diphenhydrAMINE (BENADRYL) capsule 50 mg  50 mg Oral TID PRN Bing Neighbors, NP   50 mg at 06/13/23 9604   Or   diphenhydrAMINE (BENADRYL) injection 50 mg  50 mg Intramuscular TID PRN Bing Neighbors, NP       haloperidol (HALDOL) tablet 5 mg  5 mg Oral TID PRN Bing Neighbors, NP   5 mg at 06/13/23 5409   Or   haloperidol lactate (HALDOL) injection 5 mg  5 mg Intramuscular TID PRN Bing Neighbors, NP       insulin aspart (novoLOG) injection 0-15 Units  0-15 Units Subcutaneous TID WC Harris, Godfrey Pick, NP       irbesartan (AVAPRO) tablet 75 mg  75 mg Oral Daily Bing Neighbors, NP   75 mg at 06/13/23 0931   LORazepam (ATIVAN) tablet 2 mg  2 mg Oral TID PRN Bing Neighbors, NP   2 mg at 06/13/23 8119   Or   LORazepam (ATIVAN) injection 2 mg  2 mg Intramuscular TID PRN Bing Neighbors, NP       magnesium hydroxide (MILK OF MAGNESIA) suspension 30 mL  30 mL Oral Daily PRN Bing Neighbors, NP       metFORMIN (GLUCOPHAGE-XR) 24 hr tablet 500 mg  500 mg Oral Q breakfast Bing Neighbors, NP   500 mg at 06/13/23 1478   paliperidone (INVEGA) 24 hr tablet 3 mg  3 mg Oral Daily Lauree Chandler, NP       PTA  Medications: Medications Prior to Admission  Medication Sig Dispense Refill Last Dose   atorvastatin (LIPITOR) 20 MG tablet Take 1 tablet (20 mg total) by mouth daily. 30 tablet 2    clonazePAM (KLONOPIN) 0.5 MG tablet Take 1 tablet (0.5 mg total) by mouth 2 (two) times daily. 60 tablet 0    divalproex (DEPAKOTE) 500 MG DR tablet Take 500 mg by mouth 2 (two) times daily.      irbesartan (AVAPRO) 75 MG tablet Take 1 tablet (75 mg total) by mouth daily. 30 tablet 2    meloxicam (MOBIC) 15 MG tablet Take 15 mg by mouth daily.      metformin (FORTAMET) 500 MG (OSM) 24 hr tablet Take 500 mg by mouth daily with breakfast.      metFORMIN (GLUCOPHAGE-XR) 500 MG 24 hr tablet Take 1,000 mg by mouth daily.      predniSONE (DELTASONE) 10 MG tablet Take 10 mg by  mouth daily.      venlafaxine XR (EFFEXOR-XR) 150 MG 24 hr capsule Take 1 capsule (150 mg total) by mouth daily with breakfast. 30 capsule 1    venlafaxine XR (EFFEXOR-XR) 150 MG 24 hr capsule Take 150 mg by mouth daily with breakfast.      Musculoskeletal: Strength & Muscle Tone: decreased Gait & Station: unsteady Patient leans: Front  Psychiatric Specialty Exam:  Presentation  General Appearance:  Disheveled  Eye Contact: Minimal  Speech: Other (comment); Slow; Blocked (minimal)  Speech Volume: Decreased  Handedness: Right   Mood and Affect  Mood: -- ("ok")  Affect: Labile; Tearful   Thought Process  Thought Processes: Disorganized  Duration of Psychotic Symptoms: > 6 months Past Diagnosis of Schizophrenia or Psychoactive disorder: No  Descriptions of Associations:Circumstantial  Orientation:Full (Time, Place and Person)  Thought Content:Paranoid Ideation  Hallucinations:Hallucinations: None  Ideas of Reference:Paranoia  Suicidal Thoughts:Suicidal Thoughts: No  Homicidal Thoughts:Homicidal Thoughts: Yes, Passive HI Passive Intent and/or Plan: Without Plan; Without Intent   Sensorium   Memory: Immediate Poor; Recent Poor; Remote Poor  Judgment: Poor  Insight: Poor   Executive Functions  Concentration: Poor  Attention Span: Poor  Recall: Poor  Fund of Knowledge: Poor  Language: Poor   Psychomotor Activity  Psychomotor Activity: Psychomotor Activity: Decreased   Assets  Assets: Resilience   Sleep  Sleep: Sleep: Fair Number of Hours of Sleep: 0 (UTA)    Physical Exam: Physical Exam Constitutional:      Appearance: He is not ill-appearing, toxic-appearing or diaphoretic.     Comments: somnolent  Eyes:     General: No scleral icterus. Cardiovascular:     Rate and Rhythm: Normal rate.  Pulmonary:     Effort: Pulmonary effort is normal. No respiratory distress.  Neurological:     Mental Status: He is oriented to person, place, and time.  Psychiatric:        Attention and Perception: Perception normal. He is inattentive.        Mood and Affect: Mood normal. Affect is labile and tearful.        Speech: Speech is delayed.        Behavior: Behavior is uncooperative and slowed.    Review of Systems  Reason unable to perform ROS: unable to assess due to pt presentation.   Blood pressure (!) 135/94, pulse 92, temperature (!) 97.3 F (36.3 C), temperature source Tympanic, resp. rate 16, height 5\' 10"  (1.778 m), weight 72.6 kg, SpO2 100%. Body mass index is 22.97 kg/m.  Treatment Plan Summary: Daily contact with patient to assess and evaluate symptoms and progress in treatment, Medication management, and Plan    57 y/o male w/ history of MDD severe with psychotic features, polysubstance abuse, CVA, DM2 admitted to inpatient psychiatry from [facility] after presenting bizarre, paranoid, disorganized, endorsing active suicidal ideations. On assessment today, pt is a poor historian, and unable to provide much historian. He does deny suicidal ideations, although endorses passive homicidal ideations towards his brother. He also denies auditory  visual hallucinations or paranoia. He does appear to be presenting with paranoia, regarding black mold and his brother. Pt is somnolent on assessment. Appears he received agitation edication - benadryl 50mg  oral, haldol 5mg  oral, ativan 2mg  oral this morning at 0509. UDS from 06/11/23 reviewed and is negative.  Medications -Continue tylenol 650mg  oral every 6 hours PRN mild pain -Continue maalox/mylanta 30mL oral every 4 hours PRN indigestion -Continue lipitor 20mg  oral daily for hyperlipidemia -Decrease klonopin 1mg  oral  2 times daily to klonopin 0.5mg  oral 2 times daily (this was increased from home dose of 0.5mg  BID in the emergency department prior to transfer) for abnormal body movements and anxiety, decreased today due to concerns of sedation - 06/13/23 -Continue agitation protocol: Benadryl 50mg  oral or IM 3 times daily PRN agitation; Haldol 5mg  oral or IM 3 times daily PRN agitation; Ativan 2mg  oral or IM 3 times daily PRN agitation -Continue Insulin sliding scale subcutaneous 3 times daily with meals for diabetes -Continue Avapro 75mg  oral daily for hypertension -Continue Milk of magensia 30mL oral daily PRN mild constipation -Continue glucophage-xr 500mg  oral daily with breakfast for diabetes -Discontinue seroquel 50mg  oral daily at bedtime for psychosis - 06/13/23 -Start invega 3mg  oral daily for psychosis- 06/13/23  Consults -Consult to diabetes coordinator - 06/13/23  Labs -Ordered A1c and lipid panel as pt on antipsychotic therapy - 06/13/23  Pt has been ordered to be on a 1:1. Pt is a high fall risk w/ very unsteady gait; per RN report, pt has been confused, has to be walked to his room, and requires assistance with meals.  Observation Level/Precautions:  15 minute checks  Laboratory:  HbAIC Lipid panel  Psychotherapy:    Medications:    Consultations:    Discharge Concerns:    Estimated LOS:  Other:     Physician Treatment Plan for Primary Diagnosis: MDD (major  depressive disorder), single episode, severe with psychotic features (HCC) Long Term Goal(s): Improvement in symptoms so as ready for discharge  Short Term Goals: Ability to identify changes in lifestyle to reduce recurrence of condition will improve, Ability to verbalize feelings will improve, Ability to demonstrate self-control will improve, Ability to identify and develop effective coping behaviors will improve, and Ability to identify triggers associated with substance abuse/mental health issues will improve  I certify that inpatient services furnished can reasonably be expected to improve the patient's condition.    Lauree Chandler, NP 10/22/202411:52 AM

## 2023-06-13 NOTE — BHH Counselor (Signed)
PSA unable to be completed.  Pt was given agitation medication in the morning and still under the effects.  Penni Homans, MSW, LCSW 06/13/2023 4:27 PM

## 2023-06-13 NOTE — Progress Notes (Signed)
Suicide Risk Assessment  Admission Assessment    [facility] Admission Suicide Risk Assessment   Nursing information obtained from:  Patient Demographic factors:  Male Current Mental Status:  Suicidal ideation indicated by patient Loss Factors:  NA Historical Factors:  Impulsivity Risk Reduction Factors:  NA  Total Time spent with patient: 30 minutes Principal Problem: MDD (major depressive disorder), single episode, severe with psychotic features (HCC) Diagnosis:  Principal Problem:   MDD (major depressive disorder), single episode, severe with psychotic features (HCC)  Subjective Data: 57 y/o male w/ history of MDD severe with psychotic features, polysubstance abuse, CVA, DM2 admitted [facility] inpatient psychiatry [facility] after presenting bizarre, paranoid, disorganized, endorsing active suicidal ideations.  Continued Clinical Symptoms:  Alcohol Use Disorder Identification Test Final Score (AUDIT): 0 The "Alcohol Use Disorders Identification Test", Guidelines for Use in Primary Care, Second Edition.  World Science writer [facility]). Score between 0-7:  no or low risk or alcohol related problems. Score between 8-15:  moderate risk of alcohol related problems. Score between 16-19:  high risk of alcohol related problems. Score 20 or above:  warrants further diagnostic evaluation for alcohol dependence and treatment.   CLINICAL FACTORS:   Previous Psychiatric Diagnoses and Treatments   Musculoskeletal: Strength & Muscle Tone: decreased Gait & Station: unsteady Patient leans: Front  Psychiatric Specialty Exam:  Presentation  General Appearance:  Disheveled  Eye Contact: Minimal  Speech: Other (comment); Slow; Blocked (minimal)  Speech Volume: Decreased  Handedness: Right   Mood and Affect  Mood: -- ("ok")  Affect: Labile; Tearful   Thought Process  Thought Processes: Disorganized  Descriptions of Associations:Circumstantial  Orientation:Full (Time, Place and  Person)  Thought Content:Paranoid Ideation  History of Schizophrenia/Schizoaffective disorder:No  Duration of Psychotic Symptoms:Less than six months  Hallucinations:Hallucinations: None  Ideas of Reference:Paranoia  Suicidal Thoughts:Suicidal Thoughts: No  Homicidal Thoughts:Homicidal Thoughts: Yes, Passive HI Passive Intent and/or Plan: Without Plan; Without Intent   Sensorium  Memory: Immediate Poor; Recent Poor; Remote Poor  Judgment: Poor  Insight: Poor   Executive Functions  Concentration: Poor  Attention Span: Poor  Recall: Poor  Fund of Knowledge: Poor  Language: Poor   Psychomotor Activity  Psychomotor Activity: Psychomotor Activity: Decreased   Assets  Assets: Resilience   Sleep  Sleep: Sleep: Fair Number of Hours of Sleep: 0 (UTA)    Physical Exam: Physical Exam see admission ROS see admission Blood pressure (!) 135/94, pulse 92, temperature (!) 97.3 F (36.3 C), temperature source Tympanic, resp. rate 16, height 5\' 10"  (1.778 m), weight 72.6 kg, SpO2 100%. Body mass index is 22.97 kg/m.   COGNITIVE FEATURES THAT CONTRIBUTE TO RISK:  Loss of executive function    SUICIDE RISK:   Moderate:  Frequent suicidal ideation with limited intensity, and duration, some specificity in terms of plans, no associated intent, good self-control, limited dysphoria/symptomatology, some risk factors present, and identifiable protective factors, including available and accessible social support.  PLAN OF CARE:  Daily contact with patient to assess and evaluate symptoms and progress in treatment Medication management  I certify that inpatient services furnished can reasonably be expected to improve the patient's condition.   Lauree Chandler, NP 06/13/2023, 12:18 PM

## 2023-06-13 NOTE — Inpatient Diabetes Management (Signed)
Inpatient Diabetes Program Recommendations  AACE/ADA: New Consensus Statement on Inpatient Glycemic Control (2015)  Target Ranges:  Prepandial:   less than 140 mg/dL      Peak postprandial:   less than 180 mg/dL (1-2 hours)      Critically ill patients:  140 - 180 mg/dL   Lab Results  Component Value Date   GLUCAP 114 (H) 06/12/2023   HGBA1C 9.9 (H) 04/05/2023    Review of Glycemic Control  Latest Reference Range & Units 06/12/23 09:23 06/12/23 11:43 06/12/23 16:22  Glucose-Capillary 70 - 99 mg/dL 161 (H) 096 (H) 045 (H)  (H): Data is abnormally high  Diabetes history: DM2 Outpatient Diabetes medications: Metformin XR 500 mg every day Current orders for Inpatient glycemic control: Metformin XR 500 mg every day, Novolog 0-15 units TID  Received referral for elevated A1C-909% on 04/05/23.  Current A1C is pending.  Will follow.  Will continue to follow while inpatient.  Thank you, Dulce Sellar, MSN, CDCES Diabetes Coordinator Inpatient Diabetes Program [phone] (team pager from 8a-5p)

## 2023-06-13 NOTE — Group Note (Signed)
Date:  06/13/2023 Time:  9:28 PM  Group Topic/Focus:  Wrap-Up Group:   The focus of this group is to help patients review their daily goal of treatment and discuss progress on daily workbooks.    Participation Level:  Minimal  Participation Quality:  Drowsy, Intrusive, and Resistant  Affect:  Depressed, Labile, Not Congruent, and Tearful  Cognitive:  Alert, Confused, Delusional, and Lacking  Insight: Lacking and Limited  Engagement in Group:  Limited and Poor  Modes of Intervention:  Discussion  Additional Comments:     Maglione,Nazier Neyhart E 06/13/2023, 9:28 PM

## 2023-06-13 NOTE — Group Note (Signed)
Recreation Therapy Group Note   Group Topic:Coping Skills  Group Date: 06/13/2023 Start Time: 1000 End Time: 1100 Facilitators: Rosina Lowenstein, LRT, CTRS Location:  Craft Room  Group Description: Mind Map.  Patient was provided a blank template of a diagram with 32 blank boxes in a tiered system, branching from the center (similar to a bubble chart). LRT directed patients to label the middle of the diagram "Coping Skills". LRT and patients then came up with 8 different coping skills as examples. Pt were directed to record their coping skills in the 2nd tier boxes closest to the center.  Patients would then share their coping skills with the group as LRT wrote them out. LRT gave a handout of 99 different coping skills at the end of group.   Goal Area(s) Addressed: Patients will be able to define "coping skills". Patient will identify new coping skills.  Patient will increase communication.   Affect/Mood: N/A   Participation Level: Did not attend    Clinical Observations/Individualized Feedback: Edgar White did not attend group.  Plan: Continue to engage patient in RT group sessions 2-3x/week.   Rosina Lowenstein, LRT, CTRS 06/13/2023 11:47 AM

## 2023-06-14 DIAGNOSIS — F323 Major depressive disorder, single episode, severe with psychotic features: Secondary | ICD-10-CM | POA: Diagnosis not present

## 2023-06-14 LAB — GLUCOSE, CAPILLARY
Glucose-Capillary: 216 mg/dL — ABNORMAL HIGH (ref 70–99)
Glucose-Capillary: 212 mg/dL — ABNORMAL HIGH (ref 70–99)
Glucose-Capillary: 213 mg/dL — ABNORMAL HIGH (ref 70–99)

## 2023-06-14 LAB — TSH: TSH: 1.523 u[IU]/mL (ref 0.350–4.500)

## 2023-06-14 MED ORDER — LIVING WELL WITH DIABETES BOOK
Freq: Once | Status: AC
  Filled 2023-06-14: qty 1

## 2023-06-14 MED ORDER — DIVALPROEX SODIUM 500 MG PO DR TAB: 500.0000 mg | DELAYED_RELEASE_TABLET | Freq: Two times a day (BID) | ORAL | Status: DC

## 2023-06-14 MED ORDER — DIVALPROEX SODIUM 500 MG PO DR TAB
500.0000 mg | DELAYED_RELEASE_TABLET | Freq: Two times a day (BID) | ORAL | Status: DC
  Administered 2023-06-14 – 2023-06-17 (×7): 500 mg via ORAL
  Filled 2023-06-14 (×7): qty 1

## 2023-06-14 MED ORDER — BACITRACIN-NEOMYCIN-POLYMYXIN OINTMENT TUBE
TOPICAL_OINTMENT | Freq: Two times a day (BID) | CUTANEOUS | Status: DC
  Administered 2023-06-15: 1 via TOPICAL
  Filled 2023-06-14: qty 14.17

## 2023-06-14 NOTE — Inpatient Diabetes Management (Signed)
Inpatient Diabetes Program Recommendations  AACE/ADA: New Consensus Statement on Inpatient Glycemic Control (2015)  Target Ranges:  Prepandial:   less than 140 mg/dL      Peak postprandial:   less than 180 mg/dL (1-2 hours)      Critically ill patients:  140 - 180 mg/dL   Lab Results  Component Value Date   GLUCAP 216 (H) 06/14/2023   HGBA1C 8.9 (H) 06/13/2023    Review of Glycemic Control  Latest Reference Range & Units 06/12/23 16:22 06/13/23 12:13 06/13/23 17:40 06/13/23 21:25 06/14/23 07:03  Glucose-Capillary 70 - 99 mg/dL 409 (H) 811 (H) 914 (H) 233 (H) 216 (H)  (H): Data is abnormally high    Diabetes history: DM2 Outpatient Diabetes medications: Metformin XR 500 mg every day Current orders for Inpatient glycemic control: Metformin XR 500 mg every day, Novolog 0-15 units TID   Current A1C is 8.9% which is down from 9.9%.  Ordered the Living Well with DM booklet.   Please consider:  Novolog 0-5 units QHS   Will continue to follow while inpatient.   Thank you, Dulce Sellar, MSN, CDCES Diabetes Coordinator Inpatient Diabetes Program [phone] (team pager from 8a-5p)
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
## 2023-06-14 NOTE — Progress Notes (Signed)
Patient is pleasant and cooperative. Denies SI, HI, AVH. Endorses depression. In dayroom watching tv and socializing with peers. No crying thus far. Continues to speak on brother in law. Encouragement and support provided. Safety checks maintained. Medications given as prescribed. Pt receptive and remains safe on unit with q 15 min checks.
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
## 2023-06-14 NOTE — Group Note (Signed)
Date:  06/14/2023 Time:  5:29 PM  Group Topic/Focus:  Wellness Toolbox:   The focus of this group is to discuss various aspects of wellness, balancing those aspects and exploring ways to increase the ability to experience wellness.  Patients will create a wellness toolbox for use upon discharge.    Participation Level:  Active  Participation Quality:  Appropriate  Affect:  Appropriate  Cognitive:  Appropriate  Insight: Appropriate  Engagement in Group:  Engaged  Modes of Intervention:  Activity  Additional Comments:    Wilford Corner 06/14/2023, 5:29 PM

## 2023-06-14 NOTE — Group Note (Signed)
[facility] LCSW Group Therapy Note   Group Date: 06/14/2023 Start Time: 1309 End Time: 1410   Type of Therapy/Topic:  Group Therapy:  Balance in Life  Participation Level:  Minimal   Description of Group:    This group will address the concept of balance and how it feels and looks when one is unbalanced. Patients will be encouraged to process areas in their lives that are out of balance, and identify reasons for remaining unbalanced. Facilitators will guide patients utilizing problem- solving interventions to address and correct the stressor making their life unbalanced. Understanding and applying boundaries will be explored and addressed for obtaining  and maintaining a balanced life. Patients will be encouraged to explore ways to assertively make their unbalanced needs known to significant others in their lives, using other group members and facilitator for support and feedback.  Therapeutic Goals: Patient will identify two or more emotions or situations they have that consume much of in their lives. Patient will identify signs/triggers that life has become out of balance:  Patient will identify two ways to set boundaries in order to achieve balance in their lives:  Patient will demonstrate ability to communicate their needs through discussion and/or role plays  Summary of Patient Progress: Patient was present for the majority of the group process. He shared that all of his family has basically left him. Pt endorsed feeling that his family does not want him around them anymore. 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 Pt was tearful throughout most of the interaction. Insight into the topic remains questionable.   Therapeutic Modalities:   Cognitive Behavioral Therapy Solution-Focused Therapy Assertiveness Training   Glenis Smoker, LCSW
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
## 2023-06-14 NOTE — Progress Notes (Signed)
[facility] MD Progress Note  06/14/2023 12:39 PM Edgar White  MRN:  [id number]  Subjective:  Planning and care discussed this morning in treatment team. Client was in common room finishing lunch and was tearful. Client requested to stay in common room for his check in today. Reports "I want to get out of here, I was only supposed to be here for a day." At this time the client started crying. Reports no depression despite his sadness and crying spells. "My anxiety is off and on.". Denies suicidal/ homicidal ideations, hallucinations and paranoia. Reports "appetite is good, I am still hungry." Client verbalizes "My sleep has been fine, I was so doped up yesterday, they had to spoon feed me my meals." Client expresses concerns about not being on his Depakote and a Left middle finger infection that appears to be white around the base of the nail, concerns addressed.  Per the [facility] representative, he has fixed delusions that are continuing of being infected by black mold and his sister's husband out to kill him.  He plans to go stay with a friend in [place] after discharge.  Principal Problem: MDD (major depressive disorder), single episode, severe with psychotic features (HCC) Diagnosis: Active Problems:   Major depressive disorder, recurrent severe without psychotic features (HCC)   Severe recurrent major depression without psychotic features (HCC)  Total Time spent with patient: 30 minutes  Past Psychiatric History: depression, substance abuse, anxiety  Past Medical History:  Past Medical History:  Diagnosis Date   Basal cell carcinoma 06/22/2022   L medial post shoulder, [facility]   Basal cell carcinoma 06/22/2022   L spinal mid back, [facility]   Basal cell carcinoma 06/22/2022   L spinal upper back, [facility]   Basal cell carcinoma 06/22/2022   R nasal ala, MOHs completed 08/09/22   Basal cell carcinoma 06/22/2022   R upper back, [facility]   Depression    Diabetes mellitus without complication (HCC)    Seizures (HCC)      Past Surgical History:  Procedure Laterality Date   INGUINAL HERNIA REPAIR     LUMBAR FUSION     Family History:  Family History  Problem Relation Age of Onset   Cancer Mother    Basal cell carcinoma Sister    Squamous cell carcinoma Sister    Family Psychiatric  History: none Social History:  Social History   Substance and Sexual Activity  Alcohol Use No   Comment: denies use     Social History   Substance and Sexual Activity  Drug Use Not Currently   Types: Marijuana, Cocaine   Comment: marijuana currently, no recent cocaine use    Social History   Socioeconomic History   Marital status: Single    Spouse name: Not on file   Number of children: Not on file   Years of education: Not on file   Highest education level: Not on file  Occupational History   Not on file  Tobacco Use   Smoking status: Every Day    Current packs/day: 1.00    Average packs/day: 1 pack/day for 25.8 years (25.8 ttl pk-yrs)    Types: Cigarettes    Start date: 1999   Smokeless tobacco: Current  Vaping Use   Vaping status: Never Used  Substance and Sexual Activity   Alcohol use: No    Comment: denies use   Drug use: Not Currently    Types: Marijuana, Cocaine    Comment: marijuana currently, no recent cocaine use  Sexual activity: Never  Other Topics Concern   Not on file  Social History Narrative   Right handed   Lives alone   Caffeine 3 cups daily   Social Determinants of Health   Financial Resource Strain: Not on file  Food Insecurity: No Food Insecurity (06/12/2023)   Hunger Vital Sign    Worried About Running Out of Food in the Last Year: Never true    Ran Out of Food in the Last Year: Never true  Transportation Needs: No Transportation Needs (06/12/2023)   PRAPARE - Administrator, Civil Service (Medical): No    Lack of Transportation (Non-Medical): No  Physical Activity: Not on file  Stress: Not on file  Social Connections: Not on file   Additional  Social History:  homeless  Sleep: Good  Appetite:  Good  Current Medications: Current Facility-Administered Medications  Medication Dose Route Frequency Provider Last Rate Last Admin   acetaminophen (TYLENOL) tablet 650 mg  650 mg Oral Q6H PRN Bing Neighbors, NP       alum & mag hydroxide-simeth (MAALOX/MYLANTA) 200-200-20 MG/5ML suspension 30 mL  30 mL Oral Q4H PRN Bing Neighbors, NP       atorvastatin (LIPITOR) tablet 20 mg  20 mg Oral Daily Bing Neighbors, NP   20 mg at 06/14/23 5284   clonazePAM (KLONOPIN) disintegrating tablet 0.5 mg  0.5 mg Oral BID Lauree Chandler, NP   0.5 mg at 06/14/23 1324   diphenhydrAMINE (BENADRYL) capsule 50 mg  50 mg Oral TID PRN Bing Neighbors, NP   50 mg at 06/13/23 4010   Or   diphenhydrAMINE (BENADRYL) injection 50 mg  50 mg Intramuscular TID PRN Bing Neighbors, NP       divalproex (DEPAKOTE) DR tablet 500 mg  500 mg Oral Q12H Orson Aloe, RPH       haloperidol (HALDOL) tablet 5 mg  5 mg Oral TID PRN Bing Neighbors, NP   5 mg at 06/13/23 2725   Or   haloperidol lactate (HALDOL) injection 5 mg  5 mg Intramuscular TID PRN Bing Neighbors, NP       insulin aspart (novoLOG) injection 0-15 Units  0-15 Units Subcutaneous TID WC Bing Neighbors, NP   5 Units at 06/14/23 1200   irbesartan (AVAPRO) tablet 75 mg  75 mg Oral Daily Bing Neighbors, NP   75 mg at 06/14/23 3664   living well with diabetes book MISC   Does not apply Once Sarina Ill, DO       LORazepam (ATIVAN) tablet 2 mg  2 mg Oral TID PRN Bing Neighbors, NP   2 mg at 06/13/23 4034   Or   LORazepam (ATIVAN) injection 2 mg  2 mg Intramuscular TID PRN Bing Neighbors, NP       magnesium hydroxide (MILK OF MAGNESIA) suspension 30 mL  30 mL Oral Daily PRN Bing Neighbors, NP       metFORMIN (GLUCOPHAGE-XR) 24 hr tablet 500 mg  500 mg Oral Q breakfast Bing Neighbors, NP   500 mg at 06/14/23 0842   paliperidone (INVEGA) 24 hr tablet 3  mg  3 mg Oral Daily Lauree Chandler, NP   3 mg at 06/14/23 7425    Lab Results:  Results for orders placed or performed during the hospital encounter of 06/12/23 (from the past 48 hour(s))  Hemoglobin A1c     Status: Abnormal  Collection Time: 06/13/23  9:20 AM  Result Value Ref Range   Hgb A1c MFr Bld 8.9 (H) 4.8 - 5.6 %    Comment: (NOTE) Pre diabetes:          5.7%-6.4%  Diabetes:              >6.4%  Glycemic control for   <7.0% adults with diabetes    Mean Plasma Glucose 208.73 mg/dL    Comment: Performed at [facility], [address], [place], [place] [address]  Lipid panel     Status: Abnormal   Collection Time: 06/13/23  9:20 AM  Result Value Ref Range   Cholesterol 157 0 - 200 mg/dL   Triglycerides 604 <540 mg/dL   HDL 31 (L) >98 mg/dL   Total CHOL/HDL Ratio 5.1 RATIO   VLDL 25 0 - 40 mg/dL   LDL Cholesterol 119 (H) 0 - 99 mg/dL    Comment:        Total Cholesterol/HDL:CHD Risk Coronary Heart Disease Risk Table                     Men   Women  1/2 Average Risk   3.4   3.3  Average Risk       5.0   4.4  2 X Average Risk   9.6   7.1  3 X Average Risk  23.4   11.0        Use the calculated Patient Ratio above and the CHD Risk Table to determine the patient's CHD Risk.        ATP III CLASSIFICATION (LDL):  <100     mg/dL   Optimal  147-829  mg/dL   Near or Above                    Optimal  130-159  mg/dL   Borderline  562-130  mg/dL   High  >865     mg/dL   Very High Performed at [facility], [address] [place], [place] [address]   Glucose, capillary     Status: Abnormal   Collection Time: 06/13/23 12:13 PM  Result Value Ref Range   Glucose-Capillary 167 (H) 70 - 99 mg/dL    Comment: Glucose reference range applies only to samples taken after fasting for at least 8 hours.  Glucose, capillary     Status: Abnormal   Collection Time: 06/13/23  5:40 PM  Result Value Ref Range   Glucose-Capillary 213 (H) 70 - 99 mg/dL    Comment:  Glucose reference range applies only to samples taken after fasting for at least 8 hours.  Glucose, capillary     Status: Abnormal   Collection Time: 06/13/23  9:25 PM  Result Value Ref Range   Glucose-Capillary 233 (H) 70 - 99 mg/dL    Comment: Glucose reference range applies only to samples taken after fasting for at least 8 hours.  Glucose, capillary     Status: Abnormal   Collection Time: 06/14/23  7:03 AM  Result Value Ref Range   Glucose-Capillary 216 (H) 70 - 99 mg/dL    Comment: Glucose reference range applies only to samples taken after fasting for at least 8 hours.  Glucose, capillary     Status: Abnormal   Collection Time: 06/14/23 11:58 AM  Result Value Ref Range   Glucose-Capillary 212 (H) 70 - 99 mg/dL    Comment: Glucose reference range applies only to samples taken  after fasting for at least 8 hours.    Blood Alcohol level:  Lab Results  Component Value Date   ETH <10 06/11/2023   ETH <10 02/10/2023    Metabolic Disorder Labs: Lab Results  Component Value Date   HGBA1C 8.9 (H) 06/13/2023   MPG 208.73 06/13/2023   MPG 231.69 01/07/2023   No results found for: "PROLACTIN" Lab Results  Component Value Date   CHOL 157 06/13/2023   TRIG 124 06/13/2023   HDL 31 (L) 06/13/2023   CHOLHDL 5.1 06/13/2023   VLDL 25 06/13/2023   LDLCALC 101 (H) 06/13/2023   LDLCALC 161 (H) 01/08/2023     Musculoskeletal: Strength & Muscle Tone: within normal limits Gait & Station: normal Patient leans: N/A  Psychiatric Specialty Exam: Physical Exam Vitals and nursing note reviewed.  Constitutional:      Appearance: Normal appearance.  HENT:     Head: Normocephalic.     Nose: Nose normal.  Pulmonary:     Effort: Pulmonary effort is normal.  Musculoskeletal:        General: Normal range of motion.     Cervical back: Normal range of motion.  Neurological:     General: No focal deficit present.     Mental Status: He is alert and oriented to person, place, and time.      Review of Systems  Psychiatric/Behavioral:  Positive for depression and dysphoric mood. The patient is nervous/anxious.   All other systems reviewed and are negative.   Blood pressure 106/77, pulse 85, temperature (!) 97.4 F (36.3 C), resp. rate 20, height 5\' 10"  (1.778 m), weight 72.6 kg, SpO2 99%.Body mass index is 22.97 kg/m.  General Appearance: Fairly Groomed  Eye Contact:  Good  Speech:  Clear and Coherent and Normal Rate  Volume:  Normal  Mood:  Anxious and Depressed  Affect:  Depressed and Tearful  Thought Process:  Coherent  Orientation:  Full (Time, Place, and Person)  Thought Content:  Logical, fixed delusions  Suicidal Thoughts:  No  Homicidal Thoughts:  No  Memory:  Immediate;   Fair Recent;   Fair Remote;   Fair  Judgement:  Fair  Insight:  Fair  Psychomotor Activity:  Normal  Concentration:  Concentration: Good and Attention Span: Good  Recall:  Good  Fund of Knowledge:  Good  Language:  Good  Akathisia:  Negative  Handed:  Right  AIMS (if indicated):     Assets:  Communication Skills Desire for Improvement  ADL's:  Intact  Cognition:  WNL  Sleep:  Fair       Physical Exam: Physical Exam Vitals and nursing note reviewed.  Constitutional:      Appearance: Normal appearance.  HENT:     Head: Normocephalic.     Nose: Nose normal.  Pulmonary:     Effort: Pulmonary effort is normal.  Musculoskeletal:        General: Normal range of motion.     Cervical back: Normal range of motion.  Neurological:     General: No focal deficit present.     Mental Status: He is alert and oriented to person, place, and time.    Review of Systems  Psychiatric/Behavioral:  Positive for depression and dysphoric mood. The patient is nervous/anxious.   All other systems reviewed and are negative.  Blood pressure 106/77, pulse 85, temperature (!) 97.4 F (36.3 C), resp. rate 20, height 5\' 10"  (1.778 m), weight 72.6 kg, SpO2 99%. Body mass index is 22.97  kg/m.  Treatment Plan Summary: Daily contact with patient to assess and evaluate symptoms and progress in treatment and Medication management: Major depressive disorder, recurrent, severe with psychosis: Invega 3 mg daily, A1C, TSH, and lipid panel ordered; EKG without QT prolongation Started Lexapro 10 mg daily  Seizure d/o: Depakote 500 mg BID  Anxiety and seizures: Klonopin 0.5 mg BID   Nanine Means, NP 06/14/2023, 12:39 PM

## 2023-06-14 NOTE — Group Note (Signed)
Date:  06/14/2023 Time:  10:25 AM  Group Topic/Focus:  Goals Group:   The focus of this group is to help patients establish daily goals to achieve during treatment and discuss how the patient can incorporate goal setting into their daily lives to aide in recovery.    Participation Level:  Active  Participation Quality:  Appropriate  Affect:  Appropriate  Cognitive:  Appropriate  Insight: Appropriate  Engagement in Group:  Engaged  Modes of Intervention:  Discussion, Education, and Support  Additional Comments:    Wilford Corner 06/14/2023, 10:25 AM

## 2023-06-14 NOTE — Group Note (Signed)
Date:  06/14/2023 Time:  10:47 PM  Group Topic/Focus:  Bingo game and activity    Participation Level:  Active  Participation Quality:  Appropriate and Attentive  Affect:  Appropriate  Cognitive:  Alert and Appropriate  Insight: Appropriate, Good, and Improving  Engagement in Group:  Developing/Improving and Engaged  Modes of Intervention:  Activity, Discussion, Exploration, Problem-solving, Rapport Building, Socialization, and Support  Additional Comments:     Edgar White 06/14/2023, 10:47 PM

## 2023-06-14 NOTE — Group Note (Signed)
Recreation Therapy Group Note   Group Topic:Healthy Support Systems  Group Date: 06/14/2023 Start Time: 1000 End Time: 1100 Facilitators: Rosina Lowenstein, LRT, CTRS Location:  Craft Room  Group Description: Straw Bridge. In groups or individually, patients were given 10 plastic drinking straws and an equal length of masking tape. Using the materials provided, patients were instructed to build a free-standing bridge-like structure to suspend an everyday item (ex: deck of cards) off the floor or table surface. All materials were required to be used in Secondary school teacher. LRT facilitated post-activity discussion reviewing the importance of having strong and healthy support systems in our lives. LRT discussed how the people in our lives serve as the tape and the deck of cards we placed on top of our straw structure are the stressors we face in daily life. LRT and pts discussed what happens in our life when things get too heavy for [place], and we don't have strong supports outside of the hospital. Pt shared 2 of their healthy supports in their life aloud in the group.   Goal Area(s) Addressed:  Patient will identify 2 healthy supports in their life. Patient will identify skills to successfully complete activity. Patient will identify correlation of this activity to life post-discharge.  Patient will work on Product manager. Patient will increase team-building and communication skills.    Affect/Mood: Appropriate   Participation Level: Minimal    Clinical Observations/Individualized Feedback: Edgar White was present in group briefly before asking to be excused to go see the nurse. Pt did not return.   Plan: Continue to engage patient in RT group sessions 2-3x/week.   Rosina Lowenstein, LRT, CTRS 06/14/2023 11:31 AM

## 2023-06-14 NOTE — Plan of Care (Signed)
Problem: Education: Goal: Knowledge of General Education information will improve Description: Including pain rating scale, medication(s)/side effects and non-pharmacologic comfort measures Outcome: Not Progressing   Problem: Health Behavior/Discharge Planning: Goal: Ability to manage health-related needs will improve Outcome: Not Progressing   Problem: Clinical Measurements: Goal: Ability to maintain clinical measurements within normal limits will improve Outcome: Not Progressing Goal: Will remain free from infection Outcome: Not Progressing Goal: Diagnostic test results will improve Outcome: Not Progressing Goal: Respiratory complications will improve Outcome: Not Progressing Goal: Cardiovascular complication will be avoided Outcome: Not Progressing   Problem: Activity: Goal: Risk for activity intolerance will decrease Outcome: Not Progressing   Problem: Nutrition: Goal: Adequate nutrition will be maintained Outcome: Not Progressing   Problem: Coping: Goal: Level of anxiety will decrease Outcome: Not Progressing   Problem: Elimination: Goal: Will not experience complications related to bowel motility Outcome: Not Progressing Goal: Will not experience complications related to urinary retention Outcome: Not Progressing   Problem: Pain Managment: Goal: General experience of comfort will improve Outcome: Not Progressing   Problem: Safety: Goal: Ability to remain free from injury will improve Outcome: Not Progressing   Problem: Skin Integrity: Goal: Risk for impaired skin integrity will decrease Outcome: Not Progressing   Problem: Education: Goal: Knowledge of [place] [facility] Education information/materials will improve Outcome: Not Progressing Goal: Emotional status will improve Outcome: Not Progressing Goal: Mental status will improve Outcome: Not Progressing Goal: Verbalization of understanding the information provided will improve Outcome: Not  Progressing   Problem: Activity: Goal: Interest or engagement in activities will improve Outcome: Not Progressing Goal: Sleeping patterns will improve Outcome: Not Progressing   Problem: Coping: Goal: Ability to verbalize frustrations and anger appropriately will improve Outcome: Not Progressing Goal: Ability to demonstrate self-control will improve Outcome: Not Progressing   Problem: Health Behavior/Discharge Planning: Goal: Identification of resources available to assist in meeting health care needs will improve Outcome: Not Progressing Goal: Compliance with treatment plan for underlying cause of condition will improve Outcome: Not Progressing   Problem: Physical Regulation: Goal: Ability to maintain clinical measurements within normal limits will improve Outcome: Not Progressing   Problem: Safety: Goal: Periods of time without injury will increase Outcome: Not Progressing   Problem: Education: Goal: Ability to make informed decisions regarding treatment will improve Outcome: Not Progressing   Problem: Coping: Goal: Coping ability will improve Outcome: Not Progressing   Problem: Health Behavior/Discharge Planning: Goal: Identification of resources available to assist in meeting health care needs will improve Outcome: Not Progressing   Problem: Medication: Goal: Compliance with prescribed medication regimen will improve Outcome: Not Progressing   Problem: Self-Concept: Goal: Ability to disclose and discuss suicidal ideas will improve Outcome: Not Progressing Goal: Will verbalize positive feelings about self Outcome: Not Progressing Note:     Problem: Education: Goal: Knowledge of disease or condition will improve Outcome: Not Progressing Goal: Understanding of discharge needs will improve Outcome: Not Progressing   Problem: Health Behavior/Discharge Planning: Goal: Ability to identify changes in lifestyle to reduce recurrence of condition will  improve Outcome: Not Progressing Goal: Identification of resources available to assist in meeting health care needs will improve Outcome: Not Progressing   Problem: Physical Regulation: Goal: Complications related to the disease process, condition or treatment will be avoided or minimized Outcome: Not Progressing   Problem: Safety: Goal: Ability to remain free from injury will improve Outcome: Not Progressing   Problem: Education: Goal: Knowledge of disease or condition will improve Outcome: Not Progressing Goal: Knowledge of secondary prevention  will improve (MUST DOCUMENT ALL) Outcome: Not Progressing Goal: Knowledge of patient specific risk factors will improve Loraine Leriche N/A or DELETE if not current risk factor) Outcome: Not Progressing   Problem: Ischemic Stroke/TIA Tissue Perfusion: Goal: Complications of ischemic stroke/TIA will be minimized Outcome: Not Progressing   Problem: Coping: Goal: Will verbalize positive feelings about self Outcome: Not Progressing Goal: Will identify appropriate support needs Outcome: Not Progressing   Problem: Health Behavior/Discharge Planning: Goal: Ability to manage health-related needs will improve Outcome: Not Progressing Goal: Goals will be collaboratively established with patient/family Outcome: Not Progressing

## 2023-06-14 NOTE — BH IP Treatment Plan (Signed)
Interdisciplinary Treatment and Diagnostic Plan Update  06/14/2023 Time of Session: 9:23AM Roman Ruether MRN: [id number]  Principal Diagnosis: MDD (major depressive disorder), single episode, severe with psychotic features (HCC)  Secondary Diagnoses: Principal Problem:   MDD (major depressive disorder), single episode, severe with psychotic features (HCC)   Current Medications:  Current Facility-Administered Medications  Medication Dose Route Frequency Provider Last Rate Last Admin   acetaminophen (TYLENOL) tablet 650 mg  650 mg Oral Q6H PRN Bing Neighbors, NP       alum & mag hydroxide-simeth (MAALOX/MYLANTA) 200-200-20 MG/5ML suspension 30 mL  30 mL Oral Q4H PRN Bing Neighbors, NP       atorvastatin (LIPITOR) tablet 20 mg  20 mg Oral Daily Bing Neighbors, NP   20 mg at 06/14/23 2952   clonazePAM (KLONOPIN) disintegrating tablet 0.5 mg  0.5 mg Oral BID Lauree Chandler, NP   0.5 mg at 06/14/23 8413   diphenhydrAMINE (BENADRYL) capsule 50 mg  50 mg Oral TID PRN Bing Neighbors, NP   50 mg at 06/13/23 2440   Or   diphenhydrAMINE (BENADRYL) injection 50 mg  50 mg Intramuscular TID PRN Bing Neighbors, NP       haloperidol (HALDOL) tablet 5 mg  5 mg Oral TID PRN Bing Neighbors, NP   5 mg at 06/13/23 1027   Or   haloperidol lactate (HALDOL) injection 5 mg  5 mg Intramuscular TID PRN Bing Neighbors, NP       insulin aspart (novoLOG) injection 0-15 Units  0-15 Units Subcutaneous TID WC Bing Neighbors, NP   5 Units at 06/13/23 1744   irbesartan (AVAPRO) tablet 75 mg  75 mg Oral Daily Bing Neighbors, NP   75 mg at 06/14/23 2536   LORazepam (ATIVAN) tablet 2 mg  2 mg Oral TID PRN Bing Neighbors, NP   2 mg at 06/13/23 6440   Or   LORazepam (ATIVAN) injection 2 mg  2 mg Intramuscular TID PRN Bing Neighbors, NP       magnesium hydroxide (MILK OF MAGNESIA) suspension 30 mL  30 mL Oral Daily PRN Bing Neighbors, NP       metFORMIN (GLUCOPHAGE-XR)  24 hr tablet 500 mg  500 mg Oral Q breakfast Bing Neighbors, NP   500 mg at 06/14/23 0842   paliperidone (INVEGA) 24 hr tablet 3 mg  3 mg Oral Daily Lauree Chandler, NP   3 mg at 06/14/23 3474   PTA Medications: Medications Prior to Admission  Medication Sig Dispense Refill Last Dose   atorvastatin (LIPITOR) 20 MG tablet Take 1 tablet (20 mg total) by mouth daily. 30 tablet 2    clonazePAM (KLONOPIN) 0.5 MG tablet Take 1 tablet (0.5 mg total) by mouth 2 (two) times daily. 60 tablet 0    divalproex (DEPAKOTE) 500 MG DR tablet Take 500 mg by mouth 2 (two) times daily.      irbesartan (AVAPRO) 75 MG tablet Take 1 tablet (75 mg total) by mouth daily. 30 tablet 2    meloxicam (MOBIC) 15 MG tablet Take 15 mg by mouth daily.      metformin (FORTAMET) 500 MG (OSM) 24 hr tablet Take 500 mg by mouth daily with breakfast.      metFORMIN (GLUCOPHAGE-XR) 500 MG 24 hr tablet Take 1,000 mg by mouth daily.      predniSONE (DELTASONE) 10 MG tablet Take 10 mg by mouth daily.  venlafaxine XR (EFFEXOR-XR) 150 MG 24 hr capsule Take 1 capsule (150 mg total) by mouth daily with breakfast. 30 capsule 1    venlafaxine XR (EFFEXOR-XR) 150 MG 24 hr capsule Take 150 mg by mouth daily with breakfast.       Patient Stressors: Health problems   Substance abuse    Patient Strengths: Capable of independent living  Communication skills   Treatment Modalities: Medication Management, Group therapy, Case management,  1 to 1 session with clinician, Psychoeducation, Recreational therapy.   Physician Treatment Plan for Primary Diagnosis: MDD (major depressive disorder), single episode, severe with psychotic features (HCC) Long Term Goal(s): Improvement in symptoms so as ready for discharge   Short Term Goals: Ability to identify changes in lifestyle to reduce recurrence of condition will improve Ability to verbalize feelings will improve Ability to demonstrate self-control will improve Ability to identify and  develop effective coping behaviors will improve Ability to identify triggers associated with substance abuse/mental health issues will improve  Medication Management: Evaluate patient's response, side effects, and tolerance of medication regimen.  Therapeutic Interventions: 1 to 1 sessions, Unit Group sessions and Medication administration.  Evaluation of Outcomes: Not Met  Physician Treatment Plan for Secondary Diagnosis: Principal Problem:   MDD (major depressive disorder), single episode, severe with psychotic features (HCC)  Long Term Goal(s): Improvement in symptoms so as ready for discharge   Short Term Goals: Ability to identify changes in lifestyle to reduce recurrence of condition will improve Ability to verbalize feelings will improve Ability to demonstrate self-control will improve Ability to identify and develop effective coping behaviors will improve Ability to identify triggers associated with substance abuse/mental health issues will improve     Medication Management: Evaluate patient's response, side effects, and tolerance of medication regimen.  Therapeutic Interventions: 1 to 1 sessions, Unit Group sessions and Medication administration.  Evaluation of Outcomes: Not Met   RN Treatment Plan for Primary Diagnosis: MDD (major depressive disorder), single episode, severe with psychotic features (HCC) Long Term Goal(s): Knowledge of disease and therapeutic regimen to maintain health will improve  Short Term Goals: Ability to demonstrate self-control, Ability to participate in decision making will improve, Ability to verbalize feelings will improve, Ability to disclose and discuss suicidal ideas, Ability to identify and develop effective coping behaviors will improve, and Compliance with prescribed medications will improve  Medication Management: RN will administer medications as ordered by provider, will assess and evaluate patient's response and provide education to  patient for prescribed medication. RN will report any adverse and/or side effects to prescribing provider.  Therapeutic Interventions: 1 on 1 counseling sessions, Psychoeducation, Medication administration, Evaluate responses to treatment, Monitor vital signs and CBGs as ordered, Perform/monitor CIWA, COWS, AIMS and Fall Risk screenings as ordered, Perform wound care treatments as ordered.  Evaluation of Outcomes: Not Met   LCSW Treatment Plan for Primary Diagnosis: MDD (major depressive disorder), single episode, severe with psychotic features (HCC) Long Term Goal(s): Safe transition to appropriate next level of care at discharge, Engage patient in therapeutic group addressing interpersonal concerns.  Short Term Goals: Engage patient in aftercare planning with referrals and resources, Increase social support, Increase ability to appropriately verbalize feelings, Increase emotional regulation, Facilitate acceptance of mental health diagnosis and concerns, and Increase skills for wellness and recovery  Therapeutic Interventions: Assess for all discharge needs, 1 to 1 time with Social worker, Explore available resources and support systems, Assess for adequacy in community support network, Educate family and significant other(s) on suicide prevention, Complete  Psychosocial Assessment, Interpersonal group therapy.  Evaluation of Outcomes: Not Met   Progress in Treatment: Attending groups: Yes. Participating in groups: Yes. Taking medication as prescribed: Yes. Toleration medication: Yes. Family/Significant other contact made: No, will contact:  once permission has been provided. Patient understands diagnosis: No. Discussing patient identified problems/goals with staff: Yes. Medical problems stabilized or resolved: Yes. Denies suicidal/homicidal ideation: Yes. Issues/concerns per patient self-inventory: No. Other: none  New problem(s) identified: No, Describe:  none  New Short Term/Long  Term Goal(s): detox, elimination of symptoms of psychosis, medication management for mood stabilization; elimination of SI thoughts; development of comprehensive mental wellness/sobriety plan.   Patient Goals:  "I want to get rid of my seizures"  Discharge Plan or Barriers: Patient reports plans to stay with a friend in [place] at discharge.  However, CSW notes that patient has history of being delusional and it has not been confirmed that this is a possibility.    Reason for Continuation of Hospitalization: Anxiety Depression Medication stabilization Suicidal ideation  Estimated Length of Stay: 1-7 days  Last 3 [place] Suicide Severity Risk Score: Flowsheet Row Admission (Current) from 06/12/2023 in [facility] INPATIENT BEHAV[facility]AL MEDICINE ED from 06/11/2023 in [facility] Emergency Department at [facility] ED from 05/16/2023 in [facility] Emergency Department at [facility]  C-SSRS RISK CATEGORY Low Risk High Risk No Risk       Last PHQ 2/9 Scores:     No data to display          Scribe for Treatment Team: Harden Mo, LCSW 06/14/2023 10:22 AM

## 2023-06-14 NOTE — Plan of Care (Signed)
  Problem: Coping: Goal: Level of anxiety will decrease Outcome: Progressing   Problem: Education: Goal: Emotional status will improve Outcome: Progressing Goal: Mental status will improve Outcome: Progressing

## 2023-06-14 NOTE — [facility] Suicide Risk Assessment (Signed)
[facility] INPATIENT:  Family/Significant Other Suicide Prevention Education  Suicide Prevention Education:  Education Completed; Laretta Alstrom, sister, [phone],  (name of family member/significant other) has been identified by the patient as the family member/significant other with whom the patient will be residing, and identified as the person(s) who will aid the patient in the event of a mental health crisis (suicidal ideations/suicide attempt).  With written consent from the patient, the family member/significant other has been provided the following suicide prevention education, prior to the and/or following the discharge of the patient.  The suicide prevention education provided includes the following: Suicide risk factors Suicide prevention and interventions National Suicide Hotline telephone number [facility] assessment telephone number [facility] [place] Emergency Assistance 911 [facility] and/or Residential Mobile Crisis Unit telephone number  Request made of family/significant other to: Remove weapons (e.g., guns, rifles, knives), all items previously/currently identified as safety concern.   Remove drugs/medications (over-the-counter, prescriptions, illicit drugs), all items previously/currently identified as a safety concern.  The family member/significant other verbalizes understanding of the suicide prevention education information provided.  The family member/significant other agrees to remove the items of safety concern listed above.  Harden Mo 06/14/2023, 4:15 PM

## 2023-06-15 LAB — GLUCOSE, CAPILLARY
Glucose-Capillary: 261 mg/dL — ABNORMAL HIGH (ref 70–99)
Glucose-Capillary: 267 mg/dL — ABNORMAL HIGH (ref 70–99)
Glucose-Capillary: 218 mg/dL — ABNORMAL HIGH (ref 70–99)
Glucose-Capillary: 272 mg/dL — ABNORMAL HIGH (ref 70–99)

## 2023-06-15 LAB — LIPID PANEL
Cholesterol: 177 mg/dL (ref 0–200)
HDL: 33 mg/dL — ABNORMAL LOW
LDL Cholesterol: 81 mg/dL (ref 0–99)
Total CHOL/HDL Ratio: 5.4 ratio
Triglycerides: 315 mg/dL — ABNORMAL HIGH
VLDL: 63 mg/dL — ABNORMAL HIGH (ref 0–40)

## 2023-06-15 LAB — HEMOGLOBIN A1C
Hgb A1c MFr Bld: 8.8 % — ABNORMAL HIGH (ref 4.8–5.6)
Mean Plasma Glucose: 205.86 mg/dL

## 2023-06-15 NOTE — Inpatient Diabetes Management (Signed)
Inpatient Diabetes Program Recommendations  AACE/ADA: New Consensus Statement on Inpatient Glycemic Control (2015)  Target Ranges:  Prepandial:   less than 140 mg/dL      Peak postprandial:   less than 180 mg/dL (1-2 hours)      Critically ill patients:  140 - 180 mg/dL    Latest Reference Range & Units 06/14/23 07:03 06/14/23 11:58 06/14/23 21:09  Glucose-Capillary 70 - 99 mg/dL 130 (H) 865 (H) 784 (H)  (H): Data is abnormally high  Latest Reference Range & Units 06/15/23 07:36  Glucose-Capillary 70 - 99 mg/dL 696 (H)  (H): Data is abnormally high    Diabetes history: DM2  Outpatient DM meds: Metformin XR 500 mg every day  Current orders: Metformin XR 500 mg every day   Novolog 0-15 units TID     Current A1C is 8.9% which is down from 9.9%   MD- Please consider increasing the Metformin to 500 mg BID  Note pt refused Novolog SSi yest at 8am and again at 5pm    --Will follow patient during hospitalization--  Ambrose Finland RN, MSN, CDCES Diabetes Coordinator Inpatient Glycemic Control Team Team Pager: [phone] (8a-5p)

## 2023-06-15 NOTE — Group Note (Signed)
Date:  06/15/2023 Time:  10:04 AM  Group Topic/Focus:  Early Warning Signs:   The focus of this group is to help patients identify signs or symptoms they exhibit before slipping into an unhealthy state or crisis.    Participation Level:  Active  Participation Quality:  Appropriate  Affect:  Appropriate  Cognitive:  Appropriate  Insight: Appropriate  Engagement in Group:  Engaged  Modes of Intervention:  Activity  Additional Comments:    Edgar White 06/15/2023, 10:04 AM

## 2023-06-15 NOTE — Plan of Care (Signed)
  Problem: Coping: Goal: Level of anxiety will decrease Outcome: Progressing   Problem: Safety: Goal: Ability to remain free from injury will improve Outcome: Progressing   Problem: Education: Goal: Emotional status will improve Outcome: Progressing Goal: Mental status will improve Outcome: Progressing   

## 2023-06-15 NOTE — Group Note (Signed)
Date:  06/15/2023 Time:  8:57 PM  Group Topic/Focus:  Goals Group:   The focus of this group is to help patients establish daily goals to achieve during treatment and discuss how the patient can incorporate goal setting into their daily lives to aide in recovery.    Participation Level:  Did Not Attend  Participation Quality:   none  Affect:   none  Cognitive:   none  Insight: None  Engagement in Group:   none  Modes of Intervention:   none  Additional Comments:  none   Belva Crome 06/15/2023, 8:57 PM

## 2023-06-15 NOTE — Progress Notes (Signed)
Patient is pleasant and cooperative. Denies SI, HI, AVH. Less confusion noted this shift. Socializing appropriately with peers. Voiced no concerns or complaints. Noted coloring in dayroom with peers. Encouragement and support provided. Safety checks maintained. Medications given as prescribed. Pt receptive and remains safe on unit with q 15 min checks.

## 2023-06-15 NOTE — Progress Notes (Signed)
[facility] MD Progress Note  06/15/2023 12:47 PM Edgar White  MRN:  [id number]  Subjective:  Planning and care discussed this morning in progression.  Labs, vital signs, and notes reviewed prior to assessment.  Today, he is more clear and stated, "I just had a couple of bad days and it just go to me."  Denies depression and anxiety, no suicidal/homicidal ideations or hallucinations.  He is focused on discharge which he plans to go live with a friend in [place] which the social work team verified.  His sister is a bit worried about him going because of his seizure disorder and uncertain if his friend can manage this.  However, he has been homeless since early September.  Denies side effects from his medications.  Principal Problem: MDD (major depressive disorder), single episode, severe with psychotic features (HCC) Diagnosis: Active Problems:   Major depressive disorder, recurrent severe without psychotic features (HCC)   Severe recurrent major depression without psychotic features (HCC)  Total Time spent with patient: 30 minutes  Past Psychiatric History: depression, substance abuse, anxiety  Past Medical History:  Past Medical History:  Diagnosis Date   Basal cell carcinoma 06/22/2022   L medial post shoulder, EDC   Basal cell carcinoma 06/22/2022   L spinal mid back, EDC   Basal cell carcinoma 06/22/2022   L spinal upper back, EDC   Basal cell carcinoma 06/22/2022   R nasal ala, MOHs completed 08/09/22   Basal cell carcinoma 06/22/2022   R upper back, EDC   Depression    Diabetes mellitus without complication (HCC)    Seizures (HCC)     Past Surgical History:  Procedure Laterality Date   INGUINAL HERNIA REPAIR     LUMBAR FUSION     Family History:  Family History  Problem Relation Age of Onset   Cancer Mother    Basal cell carcinoma Sister    Squamous cell carcinoma Sister    Family Psychiatric  History: none Social History:  Social History   Substance and Sexual  Activity  Alcohol Use No   Comment: denies use     Social History   Substance and Sexual Activity  Drug Use Not Currently   Types: Marijuana, Cocaine   Comment: marijuana currently, no recent cocaine use    Social History   Socioeconomic History   Marital status: Single    Spouse name: Not on file   Number of children: Not on file   Years of education: Not on file   Highest education level: Not on file  Occupational History   Not on file  Tobacco Use   Smoking status: Every Day    Current packs/day: 1.00    Average packs/day: 1 pack/day for 25.8 years (25.8 ttl pk-yrs)    Types: Cigarettes    Start date: 1999   Smokeless tobacco: Current  Vaping Use   Vaping status: Never Used  Substance and Sexual Activity   Alcohol use: No    Comment: denies use   Drug use: Not Currently    Types: Marijuana, Cocaine    Comment: marijuana currently, no recent cocaine use   Sexual activity: Never  Other Topics Concern   Not on file  Social History Narrative   Right handed   Lives alone   Caffeine 3 cups daily   Social Determinants of Health   Financial Resource Strain: Not on file  Food Insecurity: No Food Insecurity (06/12/2023)   Hunger Vital Sign    Worried About Running  Out of Food in the Last Year: Never true    Ran Out of Food in the Last Year: Never true  Transportation Needs: No Transportation Needs (06/12/2023)   PRAPARE - Administrator, Civil Service (Medical): No    Lack of Transportation (Non-Medical): No  Physical Activity: Not on file  Stress: Not on file  Social Connections: Not on file   Additional Social History:  homeless  Sleep: Good  Appetite:  Good  Current Medications: Current Facility-Administered Medications  Medication Dose Route Frequency Provider Last Rate Last Admin   acetaminophen (TYLENOL) tablet 650 mg  650 mg Oral Q6H PRN Bing Neighbors, NP       alum & mag hydroxide-simeth (MAALOX/MYLANTA) 200-200-20 MG/5ML  suspension 30 mL  30 mL Oral Q4H PRN Bing Neighbors, NP   30 mL at 06/15/23 0502   atorvastatin (LIPITOR) tablet 20 mg  20 mg Oral Daily Bing Neighbors, NP   20 mg at 06/15/23 0844   clonazePAM (KLONOPIN) disintegrating tablet 0.5 mg  0.5 mg Oral BID Lauree Chandler, NP   0.5 mg at 06/15/23 0844   diphenhydrAMINE (BENADRYL) capsule 50 mg  50 mg Oral TID PRN Bing Neighbors, NP   50 mg at 06/13/23 6213   Or   diphenhydrAMINE (BENADRYL) injection 50 mg  50 mg Intramuscular TID PRN Bing Neighbors, NP       divalproex (DEPAKOTE) DR tablet 500 mg  500 mg Oral Q12H Orson Aloe, RPH   500 mg at 06/15/23 0865   haloperidol (HALDOL) tablet 5 mg  5 mg Oral TID PRN Bing Neighbors, NP   5 mg at 06/13/23 7846   Or   haloperidol lactate (HALDOL) injection 5 mg  5 mg Intramuscular TID PRN Bing Neighbors, NP       insulin aspart (novoLOG) injection 0-15 Units  0-15 Units Subcutaneous TID WC Bing Neighbors, NP   8 Units at 06/15/23 0839   irbesartan (AVAPRO) tablet 75 mg  75 mg Oral Daily Bing Neighbors, NP   75 mg at 06/15/23 0844   LORazepam (ATIVAN) tablet 2 mg  2 mg Oral TID PRN Bing Neighbors, NP   2 mg at 06/13/23 9629   Or   LORazepam (ATIVAN) injection 2 mg  2 mg Intramuscular TID PRN Bing Neighbors, NP       magnesium hydroxide (MILK OF MAGNESIA) suspension 30 mL  30 mL Oral Daily PRN Bing Neighbors, NP       metFORMIN (GLUCOPHAGE-XR) 24 hr tablet 500 mg  500 mg Oral Q breakfast Bing Neighbors, NP   500 mg at 06/15/23 5284   neomycin-bacitracin-polymyxin (NEOSPORIN) ointment   Topical BID Charm Rings, NP   Given at 06/15/23 0845   paliperidone (INVEGA) 24 hr tablet 3 mg  3 mg Oral Daily Lauree Chandler, NP   3 mg at 06/15/23 1324    Lab Results:  Results for orders placed or performed during the hospital encounter of 06/12/23 (from the past 48 hour(s))  Glucose, capillary     Status: Abnormal   Collection Time: 06/13/23  5:40 PM   Result Value Ref Range   Glucose-Capillary 213 (H) 70 - 99 mg/dL    Comment: Glucose reference range applies only to samples taken after fasting for at least 8 hours.  Glucose, capillary     Status: Abnormal   Collection Time: 06/13/23  9:25 PM  Result Value  Ref Range   Glucose-Capillary 233 (H) 70 - 99 mg/dL    Comment: Glucose reference range applies only to samples taken after fasting for at least 8 hours.  Glucose, capillary     Status: Abnormal   Collection Time: 06/14/23  7:03 AM  Result Value Ref Range   Glucose-Capillary 216 (H) 70 - 99 mg/dL    Comment: Glucose reference range applies only to samples taken after fasting for at least 8 hours.  Glucose, capillary     Status: Abnormal   Collection Time: 06/14/23 11:58 AM  Result Value Ref Range   Glucose-Capillary 212 (H) 70 - 99 mg/dL    Comment: Glucose reference range applies only to samples taken after fasting for at least 8 hours.  Hemoglobin A1c     Status: Abnormal   Collection Time: 06/14/23  3:53 PM  Result Value Ref Range   Hgb A1c MFr Bld 8.8 (H) 4.8 - 5.6 %    Comment: (NOTE) Pre diabetes:          5.7%-6.4%  Diabetes:              >6.4%  Glycemic control for   <7.0% adults with diabetes    Mean Plasma Glucose 205.86 mg/dL    Comment: Performed at [facility], [address] [place], [place] [address]  TSH     Status: None   Collection Time: 06/14/23  3:53 PM  Result Value Ref Range   TSH 1.523 0.350 - 4.500 uIU/mL    Comment: Performed by a 3rd Generation assay with a functional sensitivity of <=0.01 uIU/mL. Performed at [facility], [address] [place], [place] [address]   Lipid panel     Status: Abnormal   Collection Time: 06/14/23  3:56 PM  Result Value Ref Range   Cholesterol 177 0 - 200 mg/dL   Triglycerides 578 (H) <150 mg/dL   HDL 33 (L) >46 mg/dL   Total CHOL/HDL Ratio 5.4 RATIO   VLDL 63 (H) 0 - 40 mg/dL   LDL Cholesterol 81 0 - 99 mg/dL    Comment:        Total  Cholesterol/HDL:CHD Risk Coronary Heart Disease Risk Table                     Men   Women  1/2 Average Risk   3.4   3.3  Average Risk       5.0   4.4  2 X Average Risk   9.6   7.1  3 X Average Risk  23.4   11.0        Use the calculated Patient Ratio above and the CHD Risk Table to determine the patient's CHD Risk.        ATP III CLASSIFICATION (LDL):  <100     mg/dL   Optimal  962-952  mg/dL   Near or Above                    Optimal  130-159  mg/dL   Borderline  841-324  mg/dL   High  >401     mg/dL   Very High Performed at [facility], [address] [place], [place] [address]   Glucose, capillary     Status: Abnormal   Collection Time: 06/14/23  9:09 PM  Result Value Ref Range   Glucose-Capillary 213 (H) 70 - 99 mg/dL    Comment: Glucose reference range applies only to  samples taken after fasting for at least 8 hours.  Glucose, capillary     Status: Abnormal   Collection Time: 06/15/23  7:36 AM  Result Value Ref Range   Glucose-Capillary 261 (H) 70 - 99 mg/dL    Comment: Glucose reference range applies only to samples taken after fasting for at least 8 hours.  Glucose, capillary     Status: Abnormal   Collection Time: 06/15/23 12:03 PM  Result Value Ref Range   Glucose-Capillary 267 (H) 70 - 99 mg/dL    Comment: Glucose reference range applies only to samples taken after fasting for at least 8 hours.    Blood Alcohol level:  Lab Results  Component Value Date   ETH <10 06/11/2023   ETH <10 02/10/2023    Metabolic Disorder Labs: Lab Results  Component Value Date   HGBA1C 8.8 (H) 06/14/2023   MPG 205.86 06/14/2023   MPG 208.73 06/13/2023   No results found for: "PROLACTIN" Lab Results  Component Value Date   CHOL 177 06/14/2023   TRIG 315 (H) 06/14/2023   HDL 33 (L) 06/14/2023   CHOLHDL 5.4 06/14/2023   VLDL 63 (H) 06/14/2023   LDLCALC 81 06/14/2023   LDLCALC 101 (H) 06/13/2023     Musculoskeletal: Strength & Muscle Tone: within normal  limits Gait & Station: normal Patient leans: N/A  Psychiatric Specialty Exam: Physical Exam Vitals and nursing note reviewed.  Constitutional:      Appearance: Normal appearance.  HENT:     Head: Normocephalic.     Nose: Nose normal.  Pulmonary:     Effort: Pulmonary effort is normal.  Musculoskeletal:        General: Normal range of motion.     Cervical back: Normal range of motion.  Neurological:     General: No focal deficit present.     Mental Status: He is alert and oriented to person, place, and time.     Review of Systems  Psychiatric/Behavioral:  Positive for depression and dysphoric mood. The patient is nervous/anxious.   All other systems reviewed and are negative.   Blood pressure 127/80, pulse (!) 103, temperature 97.6 F (36.4 C), resp. rate 18, height 5\' 10"  (1.778 m), weight 72.6 kg, SpO2 100%.Body mass index is 22.97 kg/m.  General Appearance: Fairly Groomed  Eye Contact:  Good  Speech:  Clear and Coherent and Normal Rate  Volume:  Normal  Mood:  Anxious and Depressed  Affect:  Depressed and Tearful  Thought Process:  Coherent  Orientation:  Full (Time, Place, and Person)  Thought Content:  Logical, fixed delusions  Suicidal Thoughts:  No  Homicidal Thoughts:  No  Memory:  Immediate;   Fair Recent;   Fair Remote;   Fair  Judgement:  Fair  Insight:  Fair  Psychomotor Activity:  Normal  Concentration:  Concentration: Good and Attention Span: Good  Recall:  Good  Fund of Knowledge:  Good  Language:  Good  Akathisia:  Negative  Handed:  Right  AIMS (if indicated):     Assets:  Communication Skills Desire for Improvement  ADL's:  Intact  Cognition:  WNL  Sleep:  Fair       Physical Exam: Physical Exam Vitals and nursing note reviewed.  Constitutional:      Appearance: Normal appearance.  HENT:     Head: Normocephalic.     Nose: Nose normal.  Pulmonary:     Effort: Pulmonary effort is normal.  Musculoskeletal:  General: Normal  range of motion.     Cervical back: Normal range of motion.  Neurological:     General: No focal deficit present.     Mental Status: He is alert and oriented to person, place, and time.    Review of Systems  Psychiatric/Behavioral:  Positive for depression and dysphoric mood. The patient is nervous/anxious.   All other systems reviewed and are negative.  Blood pressure 127/80, pulse (!) 103, temperature 97.6 F (36.4 C), resp. rate 18, height 5\' 10"  (1.778 m), weight 72.6 kg, SpO2 100%. Body mass index is 22.97 kg/m.   Treatment Plan Summary: Daily contact with patient to assess and evaluate symptoms and progress in treatment and Medication management: Major depressive disorder, recurrent, severe with psychosis: Invega 3 mg daily, A1C 8.8 H, TSH 1.523, and lipid panel with HDL 33 L, triglycerides of 315 H, and VLDL 63 H; EKG without QT prolongation Lexapro 10 mg daily  Seizure d/o: Depakote 500 mg BID  Anxiety and seizures: Klonopin 0.5 mg BID   Nanine Means, NP 06/15/2023, 12:47 PM

## 2023-06-15 NOTE — Progress Notes (Signed)
D- Patient alert and oriented x 4. Affect anxious/mood pleasant. Denies SI/ HI/ AVH. Patient denies pain. Patient endorses mild depression and anxiety. CBG's checked ac meals and covered s/s insulin. He was instructed on signs and symptoms hyper and hypoglycemia. He denies and signs or symptoms. A- Scheduled medications administered to patient, per MD orders. Support and encouragement provided.  Routine safety checks conducted every 15 minutes without incident.  Patient informed to notify staff with problems or concerns and verbalizes understanding. R- No adverse drug reactions noted.  Patient compliant with medications and treatment plan. Patient receptive and cooperative. He interacts well with others on the unit.  Patient contracts for safety and  remains safe on the unit at this time.

## 2023-06-15 NOTE — Group Note (Signed)
[facility] LCSW Group Therapy Note   Group Date: 06/15/2023 Start Time: 1315 End Time: 1430  Type of Therapy and Topic:  Group Therapy:  Feelings around Relapse and Recovery  Participation Level:  Active   Mood:  Description of Group:    Patients in this group will discuss emotions they experience before and after a relapse. They will process how experiencing these feelings, or avoidance of experiencing them, relates to having a relapse. Facilitator will guide patients to explore emotions they have related to recovery. Patients will be encouraged to process which emotions are more powerful. They will be guided to discuss the emotional reaction significant others in their lives may have to patients' relapse or recovery. Patients will be assisted in exploring ways to respond to the emotions of others without this contributing to a relapse.  Therapeutic Goals: Patient will identify two or more emotions that lead to relapse for them:  Patient will identify two emotions that result when they relapse:  Patient will identify two emotions related to recovery:  Patient will demonstrate ability to communicate their needs through discussion and/or role plays.   Summary of Patient Progress: Patient was present in group.  Patient was able to engage in discussion on coping skills.  He asked appropriate questions and was supportive of other group members.  Patient displayed fair insight.    Therapeutic Modalities:   Cognitive Behavioral Therapy Solution-Focused Therapy Assertiveness Training Relapse Prevention Therapy   Harden Mo, LCSW

## 2023-06-15 NOTE — Group Note (Signed)
Recreation Therapy Group Note   Group Topic:Relaxation  Group Date: 06/15/2023 Start Time: 1000 End Time: 1045 Facilitators: Rosina Lowenstein, LRT, CTRS Location:  Craft Room  Group Description: PMR (Progressive Muscle Relaxation). LRT asks patients their current level of stress/anxiety from 1-10, with 10 being the highest. LRT educates patients on what PMR is and the benefits that come from it. Patients are asked to sit with their feet flat on the floor while sitting up and all the way back in their chair, if possible. LRT and pts follow a prompt through a speaker that requires you to tense and release different muscles in their body and focus on their breathing. During session, lights are off and soft music is being played. Pts are given a stress ball to use if needed. At the end of the prompt, LRT asks patients to rank their current levels of stress/anxiety from 1-10, 10 being the highest. LRT provides patients with an education handout on PMR.   Goal Area(s) Addressed:  Patients will be able to describe progressive muscle relaxation.  Patient will practice using relaxation technique. Patient will identify a new coping skill.  Patient will follow multistep directions to reduce anxiety and stress.   Affect/Mood: Appropriate   Participation Level: Active and Engaged   Participation Quality: Independent   Behavior: Calm and Cooperative   Speech/Thought Process: Coherent   Insight: Good   Judgement: Good   Modes of Intervention: Activity   Patient Response to Interventions:  Attentive, Engaged, Interested , and Receptive   Education Outcome:  Acknowledges education   Clinical Observations/Individualized Feedback: Emir was active in their participation of session activities and group discussion. Pt identified that his anxiety and stress were a 1 before the session and after they were 0. Pt Shared that the exercise was "awesome". Pt interacted well with LRT and peers duration of  session.   Plan: Continue to engage patient in RT group sessions 2-3x/week.   Rosina Lowenstein, LRT, CTRS 06/15/2023 11:27 AM

## 2023-06-15 NOTE — Plan of Care (Signed)
Problem: Education: Goal: Knowledge of General Education information will improve Description: Including pain rating scale, medication(s)/side effects and non-pharmacologic comfort measures Outcome: Progressing   Problem: Health Behavior/Discharge Planning: Goal: Ability to manage health-related needs will improve Outcome: Progressing   Problem: Clinical Measurements: Goal: Ability to maintain clinical measurements within normal limits will improve Outcome: Progressing Goal: Will remain free from infection Outcome: Progressing Goal: Diagnostic test results will improve Outcome: Progressing Goal: Respiratory complications will improve Outcome: Progressing Goal: Cardiovascular complication will be avoided Outcome: Progressing   Problem: Activity: Goal: Risk for activity intolerance will decrease Outcome: Progressing   Problem: Nutrition: Goal: Adequate nutrition will be maintained Outcome: Progressing   Problem: Coping: Goal: Level of anxiety will decrease Outcome: Progressing   Problem: Elimination: Goal: Will not experience complications related to bowel motility Outcome: Progressing Goal: Will not experience complications related to urinary retention Outcome: Progressing   Problem: Pain Managment: Goal: General experience of comfort will improve Outcome: Progressing   Problem: Safety: Goal: Ability to remain free from injury will improve Outcome: Progressing   Problem: Skin Integrity: Goal: Risk for impaired skin integrity will decrease Outcome: Progressing   Problem: Education: Goal: Knowledge of [place] General Education information/materials will improve Outcome: Progressing Goal: Emotional status will improve Outcome: Progressing Goal: Mental status will improve Outcome: Progressing Goal: Verbalization of understanding the information provided will improve Outcome: Progressing   Problem: Activity: Goal: Interest or engagement in activities will  improve Outcome: Progressing Goal: Sleeping patterns will improve Outcome: Progressing   Problem: Coping: Goal: Ability to verbalize frustrations and anger appropriately will improve Outcome: Progressing Goal: Ability to demonstrate self-control will improve Outcome: Progressing   Problem: Health Behavior/Discharge Planning: Goal: Identification of resources available to assist in meeting health care needs will improve Outcome: Progressing Goal: Compliance with treatment plan for underlying cause of condition will improve Outcome: Progressing   Problem: Physical Regulation: Goal: Ability to maintain clinical measurements within normal limits will improve Outcome: Progressing   Problem: Safety: Goal: Periods of time without injury will increase Outcome: Progressing   Problem: Education: Goal: Ability to make informed decisions regarding treatment will improve Outcome: Progressing   Problem: Coping: Goal: Coping ability will improve Outcome: Progressing   Problem: Health Behavior/Discharge Planning: Goal: Identification of resources available to assist in meeting health care needs will improve Outcome: Progressing   Problem: Medication: Goal: Compliance with prescribed medication regimen will improve Outcome: Progressing   Problem: Self-Concept: Goal: Ability to disclose and discuss suicidal ideas will improve Outcome: Progressing Goal: Will verbalize positive feelings about self Outcome: Progressing Note:     Problem: Education: Goal: Knowledge of disease or condition will improve Outcome: Progressing Goal: Understanding of discharge needs will improve Outcome: Progressing   Problem: Health Behavior/Discharge Planning: Goal: Ability to identify changes in lifestyle to reduce recurrence of condition will improve Outcome: Progressing Goal: Identification of resources available to assist in meeting health care needs will improve Outcome: Progressing   Problem:  Physical Regulation: Goal: Complications related to the disease process, condition or treatment will be avoided or minimized Outcome: Progressing   Problem: Safety: Goal: Ability to remain free from injury will improve Outcome: Progressing   Problem: Education: Goal: Knowledge of disease or condition will improve Outcome: Progressing Goal: Knowledge of secondary prevention will improve (MUST DOCUMENT ALL) Outcome: Progressing Goal: Knowledge of patient specific risk factors will improve Loraine Leriche N/A or DELETE if not current risk factor) Outcome: Progressing   Problem: Ischemic Stroke/TIA Tissue Perfusion: Goal: Complications of ischemic stroke/TIA will  be minimized Outcome: Progressing   Problem: Coping: Goal: Will verbalize positive feelings about self Outcome: Progressing Goal: Will identify appropriate support needs Outcome: Progressing   Problem: Health Behavior/Discharge Planning: Goal: Ability to manage health-related needs will improve Outcome: Progressing Goal: Goals will be collaboratively established with patient/family Outcome: Progressing

## 2023-06-16 DIAGNOSIS — F332 Major depressive disorder, recurrent severe without psychotic features: Secondary | ICD-10-CM | POA: Diagnosis not present

## 2023-06-16 LAB — GLUCOSE, CAPILLARY
Glucose-Capillary: 235 mg/dL — ABNORMAL HIGH (ref 70–99)
Glucose-Capillary: 289 mg/dL — ABNORMAL HIGH (ref 70–99)
Glucose-Capillary: 277 mg/dL — ABNORMAL HIGH (ref 70–99)

## 2023-06-16 NOTE — BHH Counselor (Addendum)
CSW met with pt per request. He asked that [facility] be contacted to see if his vehicle was still there. CSW agreed. No other concerns expressed. Contact ended without incident.   CSW contact [facility] [phone]) and was informed that pt's car was still there. No other concerns expressed. Contact ended without incident.   CSW informed pt that car was still in the parking lot at [facility].   Vilma Meckel. Algis Greenhouse, MSW, LCSW, LCAS 06/16/2023 3:02 PM  CSW attempted to schedule aftercare appointment through [facility] [phone]). Contact unable to be established but HIPAA compliant voicemail left with contact information for follow through.   Vilma Meckel. Algis Greenhouse, MSW, LCSW, LCAS 06/16/2023 3:55 PM

## 2023-06-16 NOTE — Progress Notes (Signed)
   06/16/23 1900  Spiritual Encounters  Type of Visit Initial  Care provided to: Patient  Referral source Patient request  Reason for visit Routine spiritual support  OnCall Visit Yes  Spiritual Framework  Presenting Themes Impactful experiences and emotions  Patient Stress Factors Family relationships  Interventions  Spiritual Care Interventions Made Established relationship of care and support;Compassionate presence;Reflective listening;Prayer;Encouragement  Intervention Outcomes  Outcomes Connection to spiritual care;Awareness of support;Reduced anxiety;Reduced fear  Spiritual Care Plan  Spiritual Care Issues Still Outstanding No further spiritual care needs at this time (see row info)   Chaplain spiritual support services remain available as the need arises.

## 2023-06-16 NOTE — Inpatient Diabetes Management (Signed)
Inpatient Diabetes Program Recommendations  AACE/ADA: New Consensus Statement on Inpatient Glycemic Control (2015)  Target Ranges:  Prepandial:   less than 140 mg/dL      Peak postprandial:   less than 180 mg/dL (1-2 hours)      Critically ill patients:  140 - 180 mg/dL   Lab Results  Component Value Date   GLUCAP 235 (H) 06/16/2023   HGBA1C 8.8 (H) 06/14/2023    Review of Glycemic Control  Latest Reference Range & Units 06/15/23 07:36 06/15/23 12:03 06/15/23 16:25 06/15/23 21:11 06/16/23 07:42  Glucose-Capillary 70 - 99 mg/dL 846 (H) 962 (H) 952 (H) 272 (H) 235 (H)  (H): Data is abnormally high Diabetes history: DM2   Outpatient DM meds: Metformin XR 500 mg every day   Current orders: Metformin XR 500 mg every day   Novolog 0-15 units TID       Current A1C is 8.9% which is down from 9.9%     MD- If appropriate: - Consider increasing the Metformin to 500 mg BID - Carb modified diet - Semglee 12 units qd   Thanks, Lujean Rave, MSN, RNC-OB Diabetes Coordinator [phone] (8a-5p)

## 2023-06-16 NOTE — Group Note (Signed)
Recreation Therapy Group Note   Group Topic:Leisure Education  Group Date: 06/16/2023 Start Time: 1015 End Time: 1120 Facilitators: Rosina Lowenstein, LRT, CTRS Location:  Craft Room  Group Description: Leisure. Patients were given the option to choose from singing karaoke, coloring mandalas, using oil pastels, journaling, or playing with play-doh. LRT and pts discussed the meaning of leisure, the importance of participating in leisure during their free time/when they're outside of the hospital, as well as how our leisure interests can also serve as coping skills.   Goal Area(s) Addressed:  Patient will identify a current leisure interest.  Patient will learn the definition of "leisure". Patient will practice making a positive decision. Patient will have the opportunity to try a new leisure activity. Patient will communicate with peers and LRT.    Affect/Mood: Appropriate   Participation Level: Active and Engaged   Participation Quality: Independent   Behavior: Calm and Cooperative   Speech/Thought Process: Coherent   Insight: Good   Judgement: Good   Modes of Intervention: Activity   Patient Response to Interventions:  Attentive, Engaged, Interested , and Receptive   Education Outcome:  Acknowledges education   Clinical Observations/Individualized Feedback: Edgar White was active in their participation of session activities and group discussion. Pt identified "walk and cook" as things he does in his free time. Pt chose to color while in group. Pt interacted well with LRT and peers duration of session.   Plan: Continue to engage patient in RT group sessions 2-3x/week.   Rosina Lowenstein, LRT, CTRS 06/16/2023 12:34 PM

## 2023-06-16 NOTE — Group Note (Signed)
Date:  06/16/2023 Time:  9:36 PM  Group Topic/Focus:  Wrap-Up Group:   The focus of this group is to help patients review their daily goal of treatment and discuss progress on daily workbooks.    Participation Level:  Active  Participation Quality:  Appropriate, Attentive, Sharing, and Supportive  Affect:  Appropriate  Cognitive:  Appropriate  Insight: Appropriate and Good  Engagement in Group:  Supportive  Modes of Intervention:  Support  Additional Comments:     Belva Crome 06/16/2023, 9:36 PM

## 2023-06-16 NOTE — Progress Notes (Signed)
Patient refused scheduled Klonopin, stating that it makes him sleepy.   06/16/23 0981  Provider Notification  Provider Name/Title Shaune Pollack, NP  Date Provider Notified 06/16/23  Time Provider Notified [phone]  Method of Notification Face-to-face  Notification Reason Red med refusal

## 2023-06-16 NOTE — Progress Notes (Signed)
[facility] MD Progress Note  06/16/2023 11:04 AM Edgar White  MRN:  [id number]  Subjective:  Planning and care discussed this morning in progression.  Labs, vital signs, and notes reviewed prior to assessment.  Today, he continues to clear and states "I am feeling fine". Denies depression and anxiety. Reports "I slept good except for my heartburn". Appetite is good. Denies suicidal/homicidal ideations, hallucinations and paranoia. Client plans to drive his car to the train station and leave it parked while he goes and stays with his friend Regan Rakers for a while. Client states if he plans on staying in [place] with Regan Rakers they will drive down and pick up his car together. Client reports staying with Regan Rakers in the summer for 5-6 weeks after his suspicion of cancer diagnosis, "I just needed to get away". Client looks forward to discharge. His plans were communicated to the social work team to assure proper things were in place, like his train ticket, car parking, etc.  Principal Problem: Severe recurrent major depression without psychotic features (HCC) Diagnosis: Principal Problem:   Severe recurrent major depression without psychotic features (HCC) Active Problems:   Major depressive disorder, recurrent severe without psychotic features (HCC)  Total Time spent with patient: 30 minutes  Past Psychiatric History: depression, substance abuse, anxiety  Past Medical History:  Past Medical History:  Diagnosis Date   Basal cell carcinoma 06/22/2022   L medial post shoulder, [facility]   Basal cell carcinoma 06/22/2022   L spinal mid back, [facility]   Basal cell carcinoma 06/22/2022   L spinal upper back, [facility]   Basal cell carcinoma 06/22/2022   R nasal ala, MOHs completed 08/09/22   Basal cell carcinoma 06/22/2022   R upper back, [facility]   Depression    Diabetes mellitus without complication (HCC)    Seizures (HCC)     Past Surgical History:  Procedure Laterality Date   INGUINAL HERNIA REPAIR     LUMBAR FUSION      Family History:  Family History  Problem Relation Age of Onset   Cancer Mother    Basal cell carcinoma Sister    Squamous cell carcinoma Sister    Family Psychiatric  History: none Social History:  Social History   Substance and Sexual Activity  Alcohol Use No   Comment: denies use     Social History   Substance and Sexual Activity  Drug Use Not Currently   Types: Marijuana, Cocaine   Comment: marijuana currently, no recent cocaine use    Social History   Socioeconomic History   Marital status: Single    Spouse name: Not on file   Number of children: Not on file   Years of education: Not on file   Highest education level: Not on file  Occupational History   Not on file  Tobacco Use   Smoking status: Every Day    Current packs/day: 1.00    Average packs/day: 1 pack/day for 25.8 years (25.8 ttl pk-yrs)    Types: Cigarettes    Start date: 1999   Smokeless tobacco: Current  Vaping Use   Vaping status: Never Used  Substance and Sexual Activity   Alcohol use: No    Comment: denies use   Drug use: Not Currently    Types: Marijuana, Cocaine    Comment: marijuana currently, no recent cocaine use   Sexual activity: Never  Other Topics Concern   Not on file  Social History Narrative   Right handed   Lives alone  Caffeine 3 cups daily   Social Determinants of Health   Financial Resource Strain: Not on file  Food Insecurity: No Food Insecurity (06/12/2023)   Hunger Vital Sign    Worried About Running Out of Food in the Last Year: Never true    Ran Out of Food in the Last Year: Never true  Transportation Needs: No Transportation Needs (06/12/2023)   PRAPARE - Administrator, Civil Service (Medical): No    Lack of Transportation (Non-Medical): No  Physical Activity: Not on file  Stress: Not on file  Social Connections: Not on file   Additional Social History:  homeless  Sleep: Good  Appetite:  Good  Current Medications: Current  Facility-Administered Medications  Medication Dose Route Frequency Provider Last Rate Last Admin   acetaminophen (TYLENOL) tablet 650 mg  650 mg Oral Q6H PRN Bing Neighbors, NP       alum & mag hydroxide-simeth (MAALOX/MYLANTA) 200-200-20 MG/5ML suspension 30 mL  30 mL Oral Q4H PRN Bing Neighbors, NP   30 mL at 06/16/23 0924   atorvastatin (LIPITOR) tablet 20 mg  20 mg Oral Daily Bing Neighbors, NP   20 mg at 06/16/23 5621   clonazePAM (KLONOPIN) disintegrating tablet 0.5 mg  0.5 mg Oral BID Lauree Chandler, NP   0.5 mg at 06/15/23 0844   diphenhydrAMINE (BENADRYL) capsule 50 mg  50 mg Oral TID PRN Bing Neighbors, NP   50 mg at 06/13/23 3086   Or   diphenhydrAMINE (BENADRYL) injection 50 mg  50 mg Intramuscular TID PRN Bing Neighbors, NP       divalproex (DEPAKOTE) DR tablet 500 mg  500 mg Oral Q12H Orson Aloe, RPH   500 mg at 06/16/23 5784   haloperidol (HALDOL) tablet 5 mg  5 mg Oral TID PRN Bing Neighbors, NP   5 mg at 06/13/23 6962   Or   haloperidol lactate (HALDOL) injection 5 mg  5 mg Intramuscular TID PRN Bing Neighbors, NP       insulin aspart (novoLOG) injection 0-15 Units  0-15 Units Subcutaneous TID WC Bing Neighbors, NP   5 Units at 06/16/23 0841   irbesartan (AVAPRO) tablet 75 mg  75 mg Oral Daily Bing Neighbors, NP   75 mg at 06/16/23 9528   LORazepam (ATIVAN) tablet 2 mg  2 mg Oral TID PRN Bing Neighbors, NP   2 mg at 06/13/23 4132   Or   LORazepam (ATIVAN) injection 2 mg  2 mg Intramuscular TID PRN Bing Neighbors, NP       magnesium hydroxide (MILK OF MAGNESIA) suspension 30 mL  30 mL Oral Daily PRN Bing Neighbors, NP       metFORMIN (GLUCOPHAGE-XR) 24 hr tablet 500 mg  500 mg Oral Q breakfast Bing Neighbors, NP   500 mg at 06/16/23 4401   neomycin-bacitracin-polymyxin (NEOSPORIN) ointment   Topical BID Charm Rings, NP   1 Application at 06/15/23 1725   paliperidone (INVEGA) 24 hr tablet 3 mg  3 mg Oral Daily  Lauree Chandler, NP   3 mg at 06/16/23 0272    Lab Results:  Results for orders placed or performed during the hospital encounter of 06/12/23 (from the past 48 hour(s))  Glucose, capillary     Status: Abnormal   Collection Time: 06/14/23 11:58 AM  Result Value Ref Range   Glucose-Capillary 212 (H) 70 - 99 mg/dL  Comment: Glucose reference range applies only to samples taken after fasting for at least 8 hours.  Hemoglobin A1c     Status: Abnormal   Collection Time: 06/14/23  3:53 PM  Result Value Ref Range   Hgb A1c MFr Bld 8.8 (H) 4.8 - 5.6 %    Comment: (NOTE) Pre diabetes:          5.7%-6.4%  Diabetes:              >6.4%  Glycemic control for   <7.0% adults with diabetes    Mean Plasma Glucose 205.86 mg/dL    Comment: Performed at [facility], [address] [place], [place] [address]  TSH     Status: None   Collection Time: 06/14/23  3:53 PM  Result Value Ref Range   TSH 1.523 0.350 - 4.500 uIU/mL    Comment: Performed by a 3rd Generation assay with a functional sensitivity of <=0.01 uIU/mL. Performed at [facility], [address] [place], [place] [address]   Lipid panel     Status: Abnormal   Collection Time: 06/14/23  3:56 PM  Result Value Ref Range   Cholesterol 177 0 - 200 mg/dL   Triglycerides 027 (H) <150 mg/dL   HDL 33 (L) >25 mg/dL   Total CHOL/HDL Ratio 5.4 RATIO   VLDL 63 (H) 0 - 40 mg/dL   LDL Cholesterol 81 0 - 99 mg/dL    Comment:        Total Cholesterol/HDL:CHD Risk Coronary Heart Disease Risk Table                     Men   Women  1/2 Average Risk   3.4   3.3  Average Risk       5.0   4.4  2 X Average Risk   9.6   7.1  3 X Average Risk  23.4   11.0        Use the calculated Patient Ratio above and the CHD Risk Table to determine the patient's CHD Risk.        ATP III CLASSIFICATION (LDL):  <100     mg/dL   Optimal  366-440  mg/dL   Near or Above                    Optimal  130-159  mg/dL   Borderline  347-425   mg/dL   High  >956     mg/dL   Very High Performed at [facility], [address] [place], [place] [address]   Glucose, capillary     Status: Abnormal   Collection Time: 06/14/23  9:09 PM  Result Value Ref Range   Glucose-Capillary 213 (H) 70 - 99 mg/dL    Comment: Glucose reference range applies only to samples taken after fasting for at least 8 hours.  Glucose, capillary     Status: Abnormal   Collection Time: 06/15/23  7:36 AM  Result Value Ref Range   Glucose-Capillary 261 (H) 70 - 99 mg/dL    Comment: Glucose reference range applies only to samples taken after fasting for at least 8 hours.  Glucose, capillary     Status: Abnormal   Collection Time: 06/15/23 12:03 PM  Result Value Ref Range   Glucose-Capillary 267 (H) 70 - 99 mg/dL    Comment: Glucose reference range applies only to samples taken after fasting for at least 8 hours.  Glucose, capillary  Status: Abnormal   Collection Time: 06/15/23  4:25 PM  Result Value Ref Range   Glucose-Capillary 218 (H) 70 - 99 mg/dL    Comment: Glucose reference range applies only to samples taken after fasting for at least 8 hours.  Glucose, capillary     Status: Abnormal   Collection Time: 06/15/23  9:11 PM  Result Value Ref Range   Glucose-Capillary 272 (H) 70 - 99 mg/dL    Comment: Glucose reference range applies only to samples taken after fasting for at least 8 hours.  Glucose, capillary     Status: Abnormal   Collection Time: 06/16/23  7:42 AM  Result Value Ref Range   Glucose-Capillary 235 (H) 70 - 99 mg/dL    Comment: Glucose reference range applies only to samples taken after fasting for at least 8 hours.    Blood Alcohol level:  Lab Results  Component Value Date   ETH <10 06/11/2023   ETH <10 02/10/2023    Metabolic Disorder Labs: Lab Results  Component Value Date   HGBA1C 8.8 (H) 06/14/2023   MPG 205.86 06/14/2023   MPG 208.73 06/13/2023   No results found for: "PROLACTIN" Lab Results  Component  Value Date   CHOL 177 06/14/2023   TRIG 315 (H) 06/14/2023   HDL 33 (L) 06/14/2023   CHOLHDL 5.4 06/14/2023   VLDL 63 (H) 06/14/2023   LDLCALC 81 06/14/2023   LDLCALC 101 (H) 06/13/2023     Musculoskeletal: Strength & Muscle Tone: within normal limits Gait & Station: normal Patient leans: N/A  Psychiatric Specialty Exam: Physical Exam Vitals and nursing note reviewed.  Constitutional:      Appearance: Normal appearance.  HENT:     Head: Normocephalic.     Nose: Nose normal.  Pulmonary:     Effort: Pulmonary effort is normal.  Musculoskeletal:        General: Normal range of motion.     Cervical back: Normal range of motion.  Neurological:     General: No focal deficit present.     Mental Status: He is alert and oriented to person, place, and time.     Review of Systems  Psychiatric/Behavioral:  Positive for depression. The patient is nervous/anxious.   All other systems reviewed and are negative.   Blood pressure 126/75, pulse 98, temperature (!) 97.5 F (36.4 C), resp. rate 17, height 5\' 10"  (1.778 m), weight 72.6 kg, SpO2 100%.Body mass index is 22.97 kg/m.  General Appearance: Fairly Groomed  Eye Contact:  Good  Speech:  Clear and Coherent and Normal Rate  Volume:  Normal  Mood:  Anxious and Depressed  Affect:  Blunt  Thought Process:  Coherent  Orientation:  Full (Time, Place, and Person)  Thought Content:  Logical, fixed delusions  Suicidal Thoughts:  No  Homicidal Thoughts:  No  Memory:  Immediate;   Fair Recent;   Fair Remote;   Fair  Judgement:  Fair  Insight:  Fair  Psychomotor Activity:  Normal  Concentration:  Concentration: Good and Attention Span: Good  Recall:  Good  Fund of Knowledge:  Good  Language:  Good  Akathisia:  Negative  Handed:  Right  AIMS (if indicated):     Assets:  Communication Skills Desire for Improvement  ADL's:  Intact  Cognition:  WNL  Sleep:  Fair   Physical Exam: Physical Exam Vitals and nursing note  reviewed.  Constitutional:      Appearance: Normal appearance.  HENT:     Head: Normocephalic.  Nose: Nose normal.  Pulmonary:     Effort: Pulmonary effort is normal.  Musculoskeletal:        General: Normal range of motion.     Cervical back: Normal range of motion.  Neurological:     General: No focal deficit present.     Mental Status: He is alert and oriented to person, place, and time.    Review of Systems  Psychiatric/Behavioral:  Positive for depression. The patient is nervous/anxious.   All other systems reviewed and are negative.  Blood pressure 126/75, pulse 98, temperature (!) 97.5 F (36.4 C), resp. rate 17, height 5\' 10"  (1.778 m), weight 72.6 kg, SpO2 100%. Body mass index is 22.97 kg/m.   Treatment Plan Summary: Daily contact with patient to assess and evaluate symptoms and progress in treatment and Medication management: Major depressive disorder, recurrent, severe with psychosis: Invega 3 mg daily, A1C 8.8 H, TSH 1.523, and lipid panel with HDL 33 L, triglycerides of 315 H, and VLDL 63 H; EKG without QT prolongation Lexapro 10 mg daily  Seizure d/o: Depakote 500 mg BID  Anxiety and seizures: Klonopin 0.5 mg BID   Nanine Means, NP 06/16/2023, 11:04 AM

## 2023-06-16 NOTE — Group Note (Signed)
Date:  06/16/2023 Time:  5:40 PM  Group Topic/Focus:  Activity Group:  The focus of the group is to promote activity and encourage the patients to go outside to the courtyard and get some fresh air and some exercise.    Participation Level:  Active  Participation Quality:  Appropriate  Affect:  Appropriate  Cognitive:  Appropriate  Insight: Appropriate  Engagement in Group:  Engaged  Modes of Intervention:  Activity  Additional Comments:    Edgar White 06/16/2023, 5:40 PM

## 2023-06-16 NOTE — Progress Notes (Signed)
   06/16/23 1834  Provider Notification  Provider Name/Title Shaune Pollack, NP  Date Provider Notified 06/16/23  Time Provider Notified [phone]  Method of Notification Page (via secure chat)  Notification Reason Red med refusal (Patient reports he still has some antibiotic ointment from a previous administration.)

## 2023-06-16 NOTE — Plan of Care (Signed)
D- Patient alert and oriented. Patient presented in a pleasant mood on assessment reporting that he slept fair last night and had no complaints to voice to this Clinical research associate. Patient denied SI, HI, AVH, and pain at this time. Patient also denied any signs/symptoms of depression and anxiety, stating "I was just upset with my sister. I'm ready to go". Patient's goal for today, per his self-inventory is "staying positive", in which he will "keep active", in order to achieve his goal.  A- Scheduled medications administered to patient, per MD orders. Support and encouragement provided. Routine safety checks conducted every 15 minutes. Patient informed to notify staff with problems or concerns.  R- No adverse drug reactions noted. Patient contracts for safety at this time. Patient compliant with medications and treatment plan. Patient receptive, calm, and cooperative. Patient interacts well with others on the unit. Patient remains safe at this time.

## 2023-06-16 NOTE — Progress Notes (Signed)
Patient requested to take scheduled Klonopin at 2000, closer to bedtime, being that it makes him drowsy. This Clinical research associate moved medication administration to 2000.

## 2023-06-17 DIAGNOSIS — F332 Major depressive disorder, recurrent severe without psychotic features: Secondary | ICD-10-CM | POA: Diagnosis not present

## 2023-06-17 LAB — GLUCOSE, CAPILLARY: Glucose-Capillary: 193 mg/dL — ABNORMAL HIGH (ref 70–99)

## 2023-06-17 MED ORDER — PALIPERIDONE ER 3 MG PO TB24: 3.0000 mg | ORAL_TABLET | Freq: Every day | ORAL | 0 refills | Status: DC

## 2023-06-17 MED ORDER — CLONAZEPAM 0.5 MG PO TABS: 0.5000 mg | ORAL_TABLET | Freq: Two times a day (BID) | ORAL | 0 refills | Status: DC

## 2023-06-17 MED ORDER — DIVALPROEX SODIUM 500 MG PO DR TAB: 500.0000 mg | DELAYED_RELEASE_TABLET | Freq: Two times a day (BID) | ORAL | 0 refills | Status: DC

## 2023-06-17 NOTE — Group Note (Signed)
Date:  06/17/2023 Time:  1:47 PM  Group Topic/Focus:  Activity Group:  The focus of the group is to promote activity for the patients to encourage exercise to go out in the courtyard and get some exercise.     Participation Level:  Active  Participation Quality:  Appropriate  Affect:  Appropriate  Cognitive:  Appropriate  Insight: Appropriate  Engagement in Group:  Engaged  Modes of Intervention:  Activity  Additional Comments:    Mary Sella Farhiya Rosten 06/17/2023, 1:47 PM

## 2023-06-17 NOTE — BHH Counselor (Signed)
The LCSWA called the patient friend to confirm pick up once arrived to [place] but the friend stated that there was no space for him to live with him but he will be able to pick the patient up from train station.   The patient informed the LCSWA that he will be staying with a Mr. Roberta's friend when he arrive in [place]. There is no way for the LCSWA to call and confirm this information.   The LCSWA asked the patient if discharged would he have a place in [facility] to stay with. The patient stated that he has a friend to stay with for the night but he did not have an address or phone number.   The LCSWA will provide the patient with a list of resources for shelters.   Patric Dykes, MSW, [employer]

## 2023-06-17 NOTE — BHH Counselor (Signed)
The patient informed the LCSWA that he will be taking a Train to [place] after being discharged and picking his car up from [employer]. The LCSWA contacted the patient friend who he will be living with to confirm living situation. The LCSWA contacted Roberta at [phone] and it was confirmed the the patient will be living with him. The address is [address] [place] [address].     Patric Dykes, MSW, LCSWA 06/17/23 9:37 am

## 2023-06-17 NOTE — Progress Notes (Signed)
   06/16/23 2200  Psych Admission Type (Psych Patients Only)  Admission Status Involuntary  Psychosocial Assessment  Patient Complaints Nervousness  Eye Contact Fair  Facial Expression Anxious  Affect Appropriate to circumstance  Speech Logical/coherent  Interaction Assertive  Motor Activity Fidgety  Appearance/Hygiene Unremarkable  Behavior Characteristics Cooperative;Appropriate to situation  Mood Pleasant  Aggressive Behavior  Effect No apparent injury  Thought Process  Coherency Circumstantial  Content WDL  Delusions None reported or observed  Perception WDL  Hallucination None reported or observed  Judgment WDL  Confusion None  Danger to Self  Description of Suicide Plan Denies  Agreement Not to Harm Self Yes  Description of Agreement Verbal  Danger to Others  Danger to Others None reported or observed   Stable no complaints. Remains stable thoughout the night.

## 2023-06-17 NOTE — Plan of Care (Signed)
  Problem: Clinical Measurements: Goal: Diagnostic test results will improve Outcome: Adequate for Discharge   

## 2023-06-17 NOTE — [facility] Suicide Risk Assessment (Signed)
[facility] Discharge Suicide Risk Assessment   Principal Problem: Severe recurrent major depression without psychotic features [facility]) Discharge Diagnoses: Principal Problem:   Severe recurrent major depression without psychotic features (HCC) Active Problems:   Major depressive disorder, recurrent severe without psychotic features (HCC)   Total Time spent with patient: 45 minutes  Musculoskeletal: Strength & Muscle Tone: within normal limits Gait & Station: normal Patient leans: N/A  Psychiatric Specialty Exam: Physical Exam Vitals and nursing note reviewed.  Constitutional:      Appearance: Normal appearance.  HENT:     Head: Normocephalic.     Nose: Nose normal.  Pulmonary:     Effort: Pulmonary effort is normal.  Musculoskeletal:        General: Normal range of motion.     Cervical back: Normal range of motion.  Neurological:     General: No focal deficit present.     Mental Status: He is alert and oriented to person, place, and time.     Review of Systems  All other systems reviewed and are negative.   Blood pressure (!) 150/95, pulse 90, temperature (!) 97.5 F (36.4 C), resp. rate 17, height 5\' 10"  (1.778 m), weight 72.6 kg, SpO2 100%.Body mass index is 22.97 kg/m.  General Appearance: Casual  Eye Contact:  Good  Speech:  Clear and Coherent  Volume:  Normal  Mood:  Euthymic  Affect:  Congruent  Thought Process:  Coherent  Orientation:  Full (Time, Place, and Person)  Thought Content:  WDL and Logical  Suicidal Thoughts:  No  Homicidal Thoughts:  No  Memory:  Immediate;   Good Recent;   Good Remote;   Good  Judgement:  Fair  Insight:  Fair  Psychomotor Activity:  Normal  Concentration:  Concentration: Good and Attention Span: Good  Recall:  [employer] of Knowledge:  Fair  Language:  Good  Akathisia:  No  Handed:  Right  AIMS (if indicated):     Assets:  Leisure Time Resilience Social Support  ADL's:  Intact  Cognition:  WNL  Sleep:        Physical  Exam: Physical Exam Vitals and nursing note reviewed.  Constitutional:      Appearance: Normal appearance.  HENT:     Head: Normocephalic.     Nose: Nose normal.  Pulmonary:     Effort: Pulmonary effort is normal.  Musculoskeletal:        General: Normal range of motion.     Cervical back: Normal range of motion.  Neurological:     General: No focal deficit present.     Mental Status: He is alert and oriented to person, place, and time.    Review of Systems  All other systems reviewed and are negative.  Blood pressure (!) 150/95, pulse 90, temperature (!) 97.5 F (36.4 C), resp. rate 17, height 5\' 10"  (1.778 m), weight 72.6 kg, SpO2 100%. Body mass index is 22.97 kg/m.  Mental Status Per Nursing Assessment::   On Admission:  Suicidal ideation indicated by patient  Demographic Factors:  Male and Caucasian  Loss Factors: NA  Historical Factors: NA  Risk Reduction Factors:   Sense of responsibility to family, Positive social support, and Positive therapeutic relationship  Continued Clinical Symptoms:  None  Cognitive Features That Contribute To Risk:  None    Suicide Risk:  Minimal: No identifiable suicidal ideation.  Patients presenting with no risk factors but with morbid ruminations; may be classified as minimal risk based on the  severity of the depressive symptoms   Follow-up Information     [facility] Follow up.   Contact information: [address] [place] [address] [phone]         [facility]   Contact information: [address] [place] [address] [phone]                 Plan Of Care/Follow-up recommendations:  Activity:  as tolerated Diet:  heart healthy diet   Nanine Means, NP 06/17/2023, 12:19 PM

## 2023-06-17 NOTE — Discharge Summary (Signed)
Physician Discharge Summary Note  Patient:  Edgar White is an 57 y.o., male MRN:  [id number] DOB:  15-Jan-1966 Patient phone:  [phone] (home)  Patient address:   [address] [place] [address],  Total Time spent with patient: 45 minutes  Date of Admission:  06/12/2023 Date of Discharge: 06/17/2023  Reason for Admission:  suicidal ideations and psychosis  Principal Problem: Severe recurrent major depression without psychotic features [facility]) Discharge Diagnoses: Principal Problem:   Severe recurrent major depression without psychotic features (HCC) Active Problems:   Major depressive disorder, recurrent severe without psychotic features (HCC)   Past Psychiatric History: depression, anxiety, stimulant abuse  Past Medical History:  Past Medical History:  Diagnosis Date   Basal cell carcinoma 06/22/2022   L medial post shoulder, EDC   Basal cell carcinoma 06/22/2022   L spinal mid back, [facility]   Basal cell carcinoma 06/22/2022   L spinal upper back, EDC   Basal cell carcinoma 06/22/2022   R nasal ala, MOHs completed 08/09/22   Basal cell carcinoma 06/22/2022   R upper back, EDC   Depression    Diabetes mellitus without complication (HCC)    Seizures (HCC)     Past Surgical History:  Procedure Laterality Date   INGUINAL HERNIA REPAIR     LUMBAR FUSION     Family History:  Family History  Problem Relation Age of Onset   Cancer Mother    Basal cell carcinoma Sister    Squamous cell carcinoma Sister    Family Psychiatric  History:   none  Social History:  Social History   Substance and Sexual Activity  Alcohol Use No   Comment: denies use     Social History   Substance and Sexual Activity  Drug Use Not Currently   Types: Marijuana, Cocaine   Comment: marijuana currently, no recent cocaine use    Social History   Socioeconomic History   Marital status: Single    Spouse name: Not on file   Number of children: Not on file   Years of  education: Not on file   Highest education level: Not on file  Occupational History   Not on file  Tobacco Use   Smoking status: Every Day    Current packs/day: 1.00    Average packs/day: 1 pack/day for 25.8 years (25.8 ttl pk-yrs)    Types: Cigarettes    Start date: 1999   Smokeless tobacco: Current  Vaping Use   Vaping status: Never Used  Substance and Sexual Activity   Alcohol use: No    Comment: denies use   Drug use: Not Currently    Types: Marijuana, Cocaine    Comment: marijuana currently, no recent cocaine use   Sexual activity: Never  Other Topics Concern   Not on file  Social History Narrative   Right handed   Lives alone   Caffeine 3 cups daily   Social Determinants of Health   Financial Resource Strain: Not on file  Food Insecurity: No Food Insecurity (06/12/2023)   Hunger Vital Sign    Worried About Running Out of Food in the Last Year: Never true    Ran Out of Food in the Last Year: Never true  Transportation Needs: No Transportation Needs (06/12/2023)   PRAPARE - Administrator, Civil Service (Medical): No    Lack of Transportation (Non-Medical): No  Physical Activity: Not on file  Stress: Not on file  Social Connections: Not on file  Hospital Course:   57 y/o male w/ history of MDD severe with psychotic features, polysubstance abuse, CVA, DM2 admitted to inpatient psychiatry from [facility] after presenting bizarre, paranoid, disorganized, endorsing active suicidal ideations.   Past abuse of meth, no UDS on admission, unsure if his presentation was related to the substance.  Medications were started and he stabilized quickly with all symptoms resolving.  Denies homicidal/suicidal ideations, hallucinations, paranoia, and withdrawal/craving symptoms.  No side effects from his medication.  He decided to discharge and stay with a friend in town with follow up at [facility].  Sandler has met maximum benefit of hospitalization.  Discharged instructions provided  with explanations along with Rx, crisis numbers, and [facility] follow up information.  Musculoskeletal: Strength & Muscle Tone: within normal limits Gait & Station: normal Patient leans: N/A  Psychiatric Specialty Exam: Physical Exam Vitals and nursing note reviewed.  Constitutional:      Appearance: Normal appearance.  HENT:     Head: Normocephalic.     Nose: Nose normal.  Pulmonary:     Effort: Pulmonary effort is normal.  Musculoskeletal:        General: Normal range of motion.     Cervical back: Normal range of motion.  Neurological:     General: No focal deficit present.     Mental Status: He is alert and oriented to person, place, and time.     Review of Systems  All other systems reviewed and are negative.   Blood pressure (!) 150/95, pulse 90, temperature (!) 97.5 F (36.4 C), resp. rate 17, height 5\' 10"  (1.778 m), weight 72.6 kg, SpO2 100%.Body mass index is 22.97 kg/m.  General Appearance: Casual  Eye Contact:  Good  Speech:  Clear and Coherent  Volume:  Normal  Mood:  Euthymic  Affect:  Congruent  Thought Process:  Coherent  Orientation:  Full (Time, Place, and Person)  Thought Content:  WDL and Logical  Suicidal Thoughts:  No  Homicidal Thoughts:  No  Memory:  Immediate;   Good Recent;   Good Remote;   Good  Judgement:  Fair  Insight:  Fair  Psychomotor Activity:  Normal  Concentration:  Concentration: Good and Attention Span: Good  Recall:  [employer] of Knowledge:  Fair  Language:  Good  Akathisia:  No  Handed:  Right  AIMS (if indicated):     Assets:  Leisure Time Resilience Social Support  ADL's:  Intact  Cognition:  WNL  Sleep:        Physical Exam: Physical Exam Vitals and nursing note reviewed.  Constitutional:      Appearance: Normal appearance.  HENT:     Head: Normocephalic.     Nose: Nose normal.  Pulmonary:     Effort: Pulmonary effort is normal.  Musculoskeletal:        General: Normal range of motion.     Cervical back:  Normal range of motion.  Neurological:     General: No focal deficit present.     Mental Status: He is alert and oriented to person, place, and time.    Review of Systems  All other systems reviewed and are negative.  Blood pressure (!) 150/95, pulse 90, temperature (!) 97.5 F (36.4 C), resp. rate 17, height 5\' 10"  (1.778 m), weight 72.6 kg, SpO2 100%. Body mass index is 22.97 kg/m.   Social History   Tobacco Use  Smoking Status Every Day   Current packs/day: 1.00   Average packs/day: 1 pack/day for  25.8 years (25.8 ttl pk-yrs)   Types: Cigarettes   Start date: 1999  Smokeless Tobacco Current   Tobacco Cessation:  A prescription for an FDA-approved tobacco cessation medication was offered at discharge and the patient refused   Blood Alcohol level:  Lab Results  Component Value Date   [facility] <10 06/11/2023   ETH <10 02/10/2023    Metabolic Disorder Labs:  Lab Results  Component Value Date   HGBA1C 8.8 (H) 06/14/2023   MPG 205.86 06/14/2023   MPG 208.73 06/13/2023   No results found for: "PROLACTIN" Lab Results  Component Value Date   CHOL 177 06/14/2023   TRIG 315 (H) 06/14/2023   HDL 33 (L) 06/14/2023   CHOLHDL 5.4 06/14/2023   VLDL 63 (H) 06/14/2023   LDLCALC 81 06/14/2023   LDLCALC 101 (H) 06/13/2023    See Psychiatric Specialty Exam and Suicide Risk Assessment completed by Attending Physician prior to discharge.  Discharge destination:  Home  Is patient on multiple antipsychotic therapies at discharge:  No   Has Patient had three or more failed trials of antipsychotic monotherapy by history:  No  Recommended Plan for Multiple Antipsychotic Therapies: NA  Discharge Instructions     Diet - low sodium heart healthy   Complete by: As directed    Discharge instructions   Complete by: As directed    Follow up with [facility] and neurologist   Increase activity slowly   Complete by: As directed       Allergies as of 06/17/2023   No Known Allergies       Medication List     STOP taking these medications    meloxicam 15 MG tablet Commonly known as: MOBIC   predniSONE 10 MG tablet Commonly known as: DELTASONE   venlafaxine XR 150 MG 24 hr capsule Commonly known as: EFFEXOR-XR       TAKE these medications      Indication  atorvastatin 20 MG tablet Commonly known as: LIPITOR Take 1 tablet (20 mg total) by mouth daily.  Indication: High Amount of Fats in the Blood, High Amount of Triglycerides in the Blood   clonazePAM 0.5 MG tablet Commonly known as: KLONOPIN Take 1 tablet (0.5 mg total) by mouth 2 (two) times daily.  Indication: Feeling Anxious, Myoclonus   divalproex 500 MG DR tablet Commonly known as: DEPAKOTE Take 1 tablet (500 mg total) by mouth every 12 (twelve) hours. What changed: when to take this  Indication: seizure d/o   irbesartan 75 MG tablet Commonly known as: AVAPRO Take 1 tablet (75 mg total) by mouth daily.  Indication: High Blood Pressure   metFORMIN 500 MG 24 hr tablet Commonly known as: GLUCOPHAGE-XR Take 1,000 mg by mouth daily. What changed: Another medication with the same name was removed. Continue taking this medication, and follow the directions you see here.  Indication: Type 2 Diabetes   paliperidone 3 MG 24 hr tablet Commonly known as: INVEGA Take 1 tablet (3 mg total) by mouth daily. Start taking on: June 18, 2023  Indication: psychosis        Follow-up Information     [facility] Follow up.   Contact information: [address] [place] [address] [phone]         [facility]   Contact information: [address] [place] [address] [phone]                 Follow-up recommendations:  Comments:  Follow up with [facility]  Signed: Nanine Means, NP 06/17/2023, 12:25 PM

## 2023-06-17 NOTE — Progress Notes (Signed)
  [facility] Adult Case Management Discharge Plan :  Will you be returning to the same living situation after discharge:  No. At discharge, do you have transportation home?:    Patient car is parked at [facility]. Do you have the ability to pay for your medications: Yes,  [facility] Tailored Plan  Release of information consent forms completed and in the chart;  Patient's signature needed at discharge.  Patient to Follow up at:  Follow-up Information     [facility], [facility] Follow up.   Contact information: [address] [place] [address] [phone]         [facility]   Contact information: [address] [place] [address] [phone]                 Next level of care provider has access to [facility] Link:yes  Safety Planning and Suicide Prevention discussed: Yes,  Laretta Alstrom, sister, [phone]     Has patient been referred to the Quitline?: Patient refused referral for treatment  Patient has been referred for addiction treatment: Yes, referral information given but appointment not made [facility] (list facility).  Marshell Levan, LCSW 06/17/2023, 9:46 AM

## 2023-06-17 NOTE — Plan of Care (Signed)
Problem: Education: Goal: Knowledge of General Education information will improve Description: Including pain rating scale, medication(s)/side effects and non-pharmacologic comfort measures Outcome: Adequate for Discharge   Problem: Health Behavior/Discharge Planning: Goal: Ability to manage health-related needs will improve Outcome: Adequate for Discharge   Problem: Clinical Measurements: Goal: Ability to maintain clinical measurements within normal limits will improve Outcome: Adequate for Discharge Goal: Will remain free from infection Outcome: Adequate for Discharge Goal: Diagnostic test results will improve 06/17/2023 1157 by Delos Haring, RN Outcome: Adequate for Discharge 06/17/2023 1157 by Delos Haring, RN Outcome: Adequate for Discharge Goal: Respiratory complications will improve Outcome: Adequate for Discharge Goal: Cardiovascular complication will be avoided Outcome: Adequate for Discharge   Problem: Activity: Goal: Risk for activity intolerance will decrease Outcome: Adequate for Discharge   Problem: Nutrition: Goal: Adequate nutrition will be maintained Outcome: Adequate for Discharge   Problem: Coping: Goal: Level of anxiety will decrease Outcome: Adequate for Discharge   Problem: Elimination: Goal: Will not experience complications related to bowel motility Outcome: Adequate for Discharge Goal: Will not experience complications related to urinary retention Outcome: Adequate for Discharge   Problem: Pain Managment: Goal: General experience of comfort will improve Outcome: Adequate for Discharge   Problem: Safety: Goal: Ability to remain free from injury will improve Outcome: Adequate for Discharge   Problem: Skin Integrity: Goal: Risk for impaired skin integrity will decrease Outcome: Adequate for Discharge   Problem: Education: Goal: Knowledge of [place] General Education information/materials will improve Outcome: Adequate for  Discharge Goal: Emotional status will improve Outcome: Adequate for Discharge Goal: Mental status will improve Outcome: Adequate for Discharge Goal: Verbalization of understanding the information provided will improve Outcome: Adequate for Discharge   Problem: Activity: Goal: Interest or engagement in activities will improve Outcome: Adequate for Discharge Goal: Sleeping patterns will improve Outcome: Adequate for Discharge   Problem: Coping: Goal: Ability to verbalize frustrations and anger appropriately will improve Outcome: Adequate for Discharge Goal: Ability to demonstrate self-control will improve Outcome: Adequate for Discharge   Problem: Health Behavior/Discharge Planning: Goal: Identification of resources available to assist in meeting health care needs will improve Outcome: Adequate for Discharge Goal: Compliance with treatment plan for underlying cause of condition will improve Outcome: Adequate for Discharge   Problem: Physical Regulation: Goal: Ability to maintain clinical measurements within normal limits will improve Outcome: Adequate for Discharge   Problem: Safety: Goal: Periods of time without injury will increase Outcome: Adequate for Discharge   Problem: Education: Goal: Ability to make informed decisions regarding treatment will improve Outcome: Adequate for Discharge   Problem: Coping: Goal: Coping ability will improve Outcome: Adequate for Discharge   Problem: Health Behavior/Discharge Planning: Goal: Identification of resources available to assist in meeting health care needs will improve Outcome: Adequate for Discharge   Problem: Medication: Goal: Compliance with prescribed medication regimen will improve Outcome: Adequate for Discharge   Problem: Self-Concept: Goal: Ability to disclose and discuss suicidal ideas will improve Outcome: Adequate for Discharge Goal: Will verbalize positive feelings about self Outcome: Adequate for  Discharge Note:     Problem: Education: Goal: Knowledge of disease or condition will improve Outcome: Adequate for Discharge Goal: Understanding of discharge needs will improve Outcome: Adequate for Discharge   Problem: Health Behavior/Discharge Planning: Goal: Ability to identify changes in lifestyle to reduce recurrence of condition will improve Outcome: Adequate for Discharge Goal: Identification of resources available to assist in meeting health care needs will improve Outcome: Adequate for Discharge   Problem: Physical  Regulation: Goal: Complications related to the disease process, condition or treatment will be avoided or minimized Outcome: Adequate for Discharge   Problem: Safety: Goal: Ability to remain free from injury will improve Outcome: Adequate for Discharge   Problem: Education: Goal: Knowledge of disease or condition will improve Outcome: Adequate for Discharge Goal: Knowledge of secondary prevention will improve (MUST DOCUMENT ALL) Outcome: Adequate for Discharge Goal: Knowledge of patient specific risk factors will improve Loraine Leriche N/A or DELETE if not current risk factor) Outcome: Adequate for Discharge   Problem: Ischemic Stroke/TIA Tissue Perfusion: Goal: Complications of ischemic stroke/TIA will be minimized Outcome: Adequate for Discharge   Problem: Coping: Goal: Will verbalize positive feelings about self Outcome: Adequate for Discharge Goal: Will identify appropriate support needs Outcome: Adequate for Discharge   Problem: Health Behavior/Discharge Planning: Goal: Ability to manage health-related needs will improve Outcome: Adequate for Discharge Goal: Goals will be collaboratively established with patient/family Outcome: Adequate for Discharge

## 2023-06-17 NOTE — Progress Notes (Signed)
Discharge Note:  Patient denies SI/HI/AVH at this time. Discharge instructions, AVS, and transition record gone over with patient. Patient agrees to comply with medication management, follow-up visit, and outpatient therapy. Patient belongings returned to patient. No questions or concerns at present. Patient ambulatory off unit. Patient discharged home by taxi.

## 2023-06-17 NOTE — Plan of Care (Signed)
Pt. Verbalizes understanding of education. On going.

## 2023-06-27 ENCOUNTER — Emergency Department (HOSPITAL_COMMUNITY): Payer: MEDICAID

## 2023-06-27 ENCOUNTER — Emergency Department (HOSPITAL_COMMUNITY)
Admission: EM | Admit: 2023-06-27 | Discharge: 2023-06-27 | Disposition: A | Discharge status: Home / Self Care | Payer: MEDICAID | Attending: Emergency Medicine | Admitting: Emergency Medicine

## 2023-06-27 ENCOUNTER — Other Ambulatory Visit: Payer: Self-pay

## 2023-06-27 ENCOUNTER — Encounter (HOSPITAL_COMMUNITY): Payer: Self-pay

## 2023-06-27 DIAGNOSIS — R600 Localized edema: Secondary | ICD-10-CM | POA: Diagnosis not present

## 2023-06-27 DIAGNOSIS — R569 Unspecified convulsions: Secondary | ICD-10-CM | POA: Insufficient documentation

## 2023-06-27 DIAGNOSIS — E1165 Type 2 diabetes mellitus with hyperglycemia: Secondary | ICD-10-CM | POA: Insufficient documentation

## 2023-06-27 DIAGNOSIS — Z7984 Long term (current) use of oral hypoglycemic drugs: Secondary | ICD-10-CM | POA: Diagnosis not present

## 2023-06-27 DIAGNOSIS — Z8673 Personal history of transient ischemic attack (TIA), and cerebral infarction without residual deficits: Secondary | ICD-10-CM | POA: Insufficient documentation

## 2023-06-27 LAB — BASIC METABOLIC PANEL
Anion gap: 11 (ref 5–15)
BUN: 11 mg/dL (ref 6–20)
CO2: 20 mmol/L — ABNORMAL LOW (ref 22–32)
Calcium: 8.8 mg/dL — ABNORMAL LOW (ref 8.9–10.3)
Chloride: 104 mmol/L (ref 98–111)
Creatinine, Ser: 0.96 mg/dL (ref 0.61–1.24)
GFR, Estimated: >60 mL/min (ref >60)
Glucose, Bld: 279 mg/dL — ABNORMAL HIGH (ref 70–99)
Potassium: 4.2 mmol/L (ref 3.5–5.1)
Sodium: 135 mmol/L (ref 135–145)

## 2023-06-27 LAB — CBC
HCT: 41.9 % (ref 39.0–52.0)
Hemoglobin: 13.9 g/dL (ref 13.0–17.0)
MCH: 29.8 pg (ref 26.0–34.0)
MCHC: 33.2 g/dL (ref 30.0–36.0)
MCV: 89.7 fL (ref 80.0–100.0)
Platelets: 223 10*3/uL (ref 150–400)
RBC: 4.67 MIL/uL (ref 4.22–5.81)
RDW: 12.3 % (ref 11.5–15.5)
WBC: 6.2 10*3/uL (ref 4.0–10.5)
nRBC: 0 % (ref 0.0–0.2)

## 2023-06-27 LAB — CBG MONITORING, ED: Glucose-Capillary: 273 mg/dL — ABNORMAL HIGH (ref 70–99)

## 2023-06-27 LAB — BRAIN NATRIURETIC PEPTIDE: B Natriuretic Peptide: 38.2 pg/mL (ref 0.0–100.0)

## 2023-06-27 LAB — VALPROIC ACID LEVEL: Valproic Acid Lvl: 67 ug/mL (ref 50.0–100.0)

## 2023-06-27 MED ORDER — LORAZEPAM 1 MG PO TABS
1.0000 mg | ORAL_TABLET | Freq: Once | ORAL | Status: AC
  Administered 2023-06-27: 1 mg via ORAL
  Filled 2023-06-27: qty 1

## 2023-06-27 MED ORDER — HYDROXYZINE HCL 25 MG PO TABS
25.0000 mg | ORAL_TABLET | Freq: Four times a day (QID) | ORAL | 0 refills | Status: DC
Start: 2023-06-27 — End: 2023-09-25

## 2023-06-27 MED ORDER — LEVETIRACETAM IN NACL 1500 MG/100ML IV SOLN
1500.0000 mg | Freq: Once | INTRAVENOUS | Status: AC
  Administered 2023-06-27: 1500 mg via INTRAVENOUS
  Filled 2023-06-27: qty 100

## 2023-06-27 MED ORDER — ACETAMINOPHEN 325 MG PO TABS
650.0000 mg | ORAL_TABLET | Freq: Once | ORAL | Status: AC
  Administered 2023-06-27: 650 mg via ORAL
  Filled 2023-06-27: qty 2

## 2023-06-27 NOTE — Discharge Instructions (Signed)
It was a pleasure taking care of you today.  As discussed, you left prior to evaluation by a behavioral specialist per your request.  I am sending you home with anxiety medication.  Take as needed.  Please follow-up with your psychiatrist and PCP this week for further evaluation.  Your Depakote level was within normal limits. Continue taking your medications as prescribed. Return to the ER for new or worsening symptoms.

## 2023-06-27 NOTE — ED Provider Notes (Signed)
Care assumed from [place], PA-C at shift change pending valproic acid level.  See his note for full HPI.  In short, patient is a 57 year old male who presents to the ED due to possible seizure.  Patient notes this episode was unlike his typical seizures.  He was conscious the entire time when he was unable to control his body.  No loss of bowels or bladder.  Patient has been compliant with his Depakote.  Patient has a history of depression and anxiety.  Suspect taking Klonopin and Invega after discharge from a psychiatric facility on 10/25 however, has not been taking his medications. Denies chronic alcohol use.   Plan from previous provider.  Follow-up on valproic acid level.  Patient loaded with Keppra prior to shift change. Physical Exam  BP 136/89   Pulse 82   Temp 99.2 F (37.3 C) (Oral)   Resp 18   Ht 5\' 10"  (1.778 m)   Wt 72.6 kg   SpO2 100%   BMI 22.96 kg/m   Physical Exam Vitals and nursing note reviewed.  Constitutional:      General: He is not in acute distress.    Appearance: He is not ill-appearing.  HENT:     Head: Normocephalic.  Eyes:     Pupils: Pupils are equal, round, and reactive to light.  Cardiovascular:     Rate and Rhythm: Normal rate and regular rhythm.     Pulses: Normal pulses.     Heart sounds: Normal heart sounds. No murmur heard.    No friction rub. No gallop.  Pulmonary:     Effort: Pulmonary effort is normal.     Breath sounds: Normal breath sounds.  Abdominal:     General: Abdomen is flat. There is no distension.     Palpations: Abdomen is soft.     Tenderness: There is no abdominal tenderness. There is no guarding or rebound.  Musculoskeletal:        General: Normal range of motion.     Cervical back: Neck supple.  Skin:    General: Skin is warm and dry.  Neurological:     General: No focal deficit present.     Mental Status: He is alert.  Psychiatric:        Mood and Affect: Mood normal.        Behavior: Behavior normal.      Procedures  Procedures  ED Course / MDM    Medical Decision Making Amount and/or Complexity of Data Reviewed Labs: ordered. Radiology: ordered.  Risk OTC drugs. Prescription drug management.   Care assumed from [place], PA-C at shift change pending valproic acid level.  See his note for full HPI.  7:26 AM reassessed patient at bedside.  Patient tearful during evaluation.  Unable to explain why.  Denies SI and HI however, admits to severe anxiety.  Patient requesting to speak to a behavioral specialist.  TTS consulted.  Patient has been medically cleared for TTS evaluation.  Valproic acid level within normal limits.  Patient has had no further seizures while here in the ED for roughly 4 hours.  Unclear whether or not this was an actual seizure or not.  8:01 AM Informed by RN that patient would like to leave prior to TTS evaluation. Patient denies SI, HI and auditory/visual hallucinations. No evidence of psychosis on exam. Do not feel patient needs to be IVC'd at this time. No further seizure-like activity after 4.5 hours in the ED. Advised patient to continue taking  his seizure medication as previously prescribed.  Follow-up with psychiatrist and PCP this week for further evaluation.  Patient stable for discharge. Strict ED precautions discussed with patient. Patient states understanding and agrees to plan. Patient discharged home in no acute distress and stable vitals     Jesusita Oka 06/27/23 0803    Gilda Crease, MD 06/28/23 [phone]

## 2023-06-27 NOTE — ED Provider Notes (Signed)
[facility] Provider Note   CSN: [id number] Arrival date & time: 06/27/23  6962     History  Chief Complaint  Patient presents with   Seizures    Edgar White is a 57 y.o. male with past medical history significant for depression, anxiety, substance abuse, diabetes, tobacco use, suicidal thoughts who presents concern for possible seizure.  Patient reports that it was unlike his other seizures, and that he remained conscious the whole time but that he was folded in half, and felt like he could not control his body..  She denies any loss control of bladder, bowels.  Patient reports that he has some tingling in his hands and his feet.  Patient reports that he had been exposed for a year to black mold and thinks that his symptoms are related to this.  He reports some swelling of bilateral ankles and reports that he has been walking around frequently throughout the night.  Patient reports that he has been taking his Depakote like normal.  He is also prescribed Klonopin and Invega but reports that he has not been taking these medications, was not aware that he was supposed to be taking, despite being just discharged from psychiatric facility on 10/25 with this prescription.   Seizures      Home Medications Prior to Admission medications   Medication Sig Start Date End Date Taking? Authorizing Provider  atorvastatin (LIPITOR) 20 MG tablet Take 1 tablet (20 mg total) by mouth daily. 01/09/23   Pennie Banter, DO  clonazePAM (KLONOPIN) 0.5 MG tablet Take 1 tablet (0.5 mg total) by mouth 2 (two) times daily. 06/17/23   Charm Rings, NP  divalproex (DEPAKOTE) 500 MG DR tablet Take 1 tablet (500 mg total) by mouth every 12 (twelve) hours. 06/17/23 07/17/23  Charm Rings, NP  irbesartan (AVAPRO) 75 MG tablet Take 1 tablet (75 mg total) by mouth daily. 01/09/23   Pennie Banter, DO  metFORMIN (GLUCOPHAGE-XR) 500 MG 24 hr tablet Take 1,000 mg  by mouth daily. 03/22/23   [provider]  paliperidone (INVEGA) 3 MG 24 hr tablet Take 1 tablet (3 mg total) by mouth daily. 06/18/23 07/18/23  Charm Rings, NP      Allergies    Patient has no known allergies.    Review of Systems   Review of Systems  Neurological:  Positive for seizures.  All other systems reviewed and are negative.   Physical Exam Updated Vital Signs BP (!) 154/89   Pulse 87   Temp 99.2 F (37.3 C) (Oral)   Resp 13   Ht 5\' 10"  (1.778 m)   Wt 72.6 kg   SpO2 100%   BMI 22.96 kg/m  Physical Exam Vitals and nursing note reviewed.  Constitutional:      General: He is not in acute distress.    Appearance: Normal appearance.  HENT:     Head: Normocephalic and atraumatic.  Eyes:     General:        Right eye: No discharge.        Left eye: No discharge.  Cardiovascular:     Rate and Rhythm: Normal rate and regular rhythm.     Heart sounds: No murmur heard.    No friction rub. No gallop.  Pulmonary:     Effort: Pulmonary effort is normal.     Breath sounds: Normal breath sounds.  Abdominal:     General: Bowel sounds are normal.  Palpations: Abdomen is soft.  Musculoskeletal:     Comments: 1+ nonpitting edema bilateral lower extremities.  Skin:    General: Skin is warm and dry.     Capillary Refill: Capillary refill takes less than 2 seconds.  Neurological:     Mental Status: He is alert and oriented to person, place, and time.     Comments: Cranial nerves II through XII grossly intact.  Intact finger-nose, intact heel-to-shin.  Romberg negative, gait normal.  Alert and oriented x3.  Moves all 4 limbs spontaneously, normal coordination.  No pronator drift.  Intact strength 5 out of 5 bilateral upper and lower extremities.   Psychiatric:        Mood and Affect: Mood normal.        Behavior: Behavior normal.     Comments: Anxious, tearful throughout exam, thoughts are somewhat paranoid     ED Results / Procedures / Treatments    Labs (all labs ordered are listed, but only abnormal results are displayed) Labs Reviewed  BASIC METABOLIC PANEL - Abnormal; Notable for the following components:      Result Value   CO2 20 (*)    Glucose, Bld 279 (*)    Calcium 8.8 (*)    All other components within normal limits  CBG MONITORING, ED - Abnormal; Notable for the following components:   Glucose-Capillary 273 (*)    All other components within normal limits  CBC  BRAIN NATRIURETIC PEPTIDE  VALPROIC ACID LEVEL    EKG EKG Interpretation Date/Time:  Tuesday June 27 2023 04:18:20 EST Ventricular Rate:  93 PR Interval:  136 QRS Duration:  99 QT Interval:  348 QTC Calculation: 433 R Axis:   72  Text Interpretation: Sinus rhythm Probable left atrial enlargement ST depr, consider ischemia, inferior leads No acute changes Confirmed by Gilda Crease [phone]) on 06/27/2023 4:30:33 AM  Radiology CT Head Wo Contrast  Result Date: 06/27/2023 CLINICAL DATA:  Seizure, new onset, no history of trauma. EXAM: CT HEAD WITHOUT CONTRAST TECHNIQUE: Contiguous axial images were obtained from the base of the skull through the vertex without intravenous contrast. RADIATION DOSE REDUCTION: This exam was performed according to the departmental dose-optimization program which includes automated exposure control, adjustment of the mA and/or kV according to patient size and/or use of iterative reconstruction technique. COMPARISON:  01/07/2023, 01/07/2023. FINDINGS: Brain: No acute intracranial hemorrhage, midline shift or mass effect. No extra-axial fluid collection is seen. Gray-white matter differentiation is within normal limits. No hydrocephalus. Mild encephalomalacia is noted in the parietal lobe on the right, compatible with known old infarcts. Vascular: No hyperdense vessel or unexpected calcification. Skull: Normal. Negative for fracture or focal lesion. Sinuses/Orbits: No acute finding. Other: None. IMPRESSION: No acute  intracranial process. Electronically Signed   By: Thornell Sartorius M.D.   On: 06/27/2023 05:06   DG Chest Portable 1 View  Result Date: 06/27/2023 CLINICAL DATA:  Shortness of breath.  Black mold poisoning. EXAM: PORTABLE CHEST 1 VIEW COMPARISON:  06/13/2013. FINDINGS: The heart size and mediastinal contours are within normal limits. No consolidation, effusion, or pneumothorax. No acute osseous abnormality is seen. IMPRESSION: No active disease. Electronically Signed   By: Thornell Sartorius M.D.   On: 06/27/2023 04:55    Procedures Procedures    Medications Ordered in ED Medications - No data to display  ED Course/ Medical Decision Making/ A&P  Medical Decision Making Amount and/or Complexity of Data Reviewed Labs: ordered. Radiology: ordered.   This patient is a 57 y.o. male  who presents to the ED for concern of possible seizure, also worried about possible black mold exposure.   Differential diagnoses prior to evaluation: The emergent differential diagnosis includes, but is not limited to, seizure on antiepileptic medication, initially concern for depression with psychotic features, medication noncompliance, for his lower extremity swelling considered heart failure, kidney failure, dependent edema secondary to walking, versus other. This is not an exhaustive differential.   Past Medical History / Co-morbidities / Social History: Diabetes, depression, history of seizures, substance-induced mood disorder, previous CVA  Additional history: Chart reviewed. Pertinent results include: Reviewed lab work, imaging from patient's previous emergency department visits, recent psychiatric hospitalization send assessments  Physical Exam: Physical exam performed. The pertinent findings include: Patient somewhat hypertensive in the emergency department, blood pressure 154/89 most recently.  Vital signs otherwise stable, other than some mild tachycardia on arrival, 102.   Improved on reassessment.  Patient is quite anxious, tearful, he does have around 1+ nonpitting edema bilateral lower extremities.  Lab Tests/Imaging studies: I personally interpreted labs/imaging and the pertinent results include: BMP notable for hyperglycemia, glucose 279, otherwise overall unremarkable, CBC unremarkable, BNP, and valproic acid level pending, patient will require reassessment to ensure no return of seizure activity.  Cardiac monitoring: EKG obtained and interpreted by myself and attending physician which shows: Normal sinus rhythm, some ST depression in inferior leads, but no significant change from baseline   6:28 AM Care of [name] transferred to [facility] and Dr. Denton Lank at the end of my shift as the patient will require reassessment once labs/imaging have resulted. Patient presentation, ED course, and plan of care discussed with review of all pertinent labs and imaging. Please see his/her note for further details regarding further ED course and disposition. Plan at time of handoff is reassess. This may be altered or completely changed at the discretion of the oncoming team pending results of further workup.  Final Clinical Impression(s) / ED Diagnoses Final diagnoses:  None    Rx / DC Orders ED Discharge Orders     None         Olene Floss, PA-C 06/27/23 0947    Gilda Crease, MD 06/27/23 0700

## 2023-06-27 NOTE — ED Notes (Signed)
A&O x 4. Calm. Cooperative. No complaints. Denies suicidal or homicidal ideations. States "I have appointments this morning to try to get assistance for free housing and need to leave. Verified patient not IVC and able to be discharged.

## 2023-06-27 NOTE — ED Triage Notes (Signed)
Pt arrived via POV with coffee in hand thru triage. Pt states that he had a grand mal seizure, pt denies incontinence of bowel or bladder. Pt states that his hands are killing him. Pt states that he has black mold poisoning. Pt also states that his bilateral ankles are swollen because he is unable to sleep and must walk around.

## 2023-07-11 ENCOUNTER — Telehealth: Payer: Self-pay | Admitting: Gastroenterology

## 2023-07-11 ENCOUNTER — Telehealth: Payer: Self-pay | Admitting: Neurology

## 2023-07-11 NOTE — Congregational Nurse Program (Signed)
  Dept: [phone]   Congregational Nurse Program Note  Date of Encounter: 06/26/2023  Resident with elevated blood pressure of 166/92 off blood pressure medication sent d/t lack of funds for co-pay.  Referred to [facility] house case Production designer, theatre/television/film for co-pay assistance. Past Medical History: Past Medical History:  Diagnosis Date   Basal cell carcinoma 06/22/2022   L medial post shoulder, EDC   Basal cell carcinoma 06/22/2022   L spinal mid back, EDC   Basal cell carcinoma 06/22/2022   L spinal upper back, EDC   Basal cell carcinoma 06/22/2022   R nasal ala, MOHs completed 08/09/22   Basal cell carcinoma 06/22/2022   R upper back, EDC   Depression    Diabetes mellitus without complication (HCC)    Seizures (HCC)     Encounter Details:  Community Questionnaire - 06/26/23 1500       Questionnaire   Ask client: Do you give verbal consent for me to treat you today? Yes    Student Assistance N/A    Location Patient Served  [facility]    Encounter Setting CN site    Population Status Unhoused    Engineer, building services or [place] Insurance    Insurance/Financial Assistance Referral N/A    Medication N/A    Medical Provider Yes    Screening Referrals Made N/A    Medical Referrals Made Non-[facility] PCP/Clinic    Medical Appointment Completed N/A    CNP Interventions Advocate/Support;Counsel;Reviewed Medications    Screenings CN Performed Blood Pressure    ED Visit Averted N/A    Life-Saving Intervention Made N/A

## 2023-07-11 NOTE — Telephone Encounter (Signed)
error 

## 2023-07-11 NOTE — Telephone Encounter (Signed)
ERROR

## 2023-07-24 ENCOUNTER — Telehealth: Payer: Self-pay | Admitting: Neurology

## 2023-07-24 NOTE — Telephone Encounter (Signed)
Pt cam into office looking for an earlier appt. States he is getting worse, his hands are in pain and he has black mold in his home and he is afraid he is dying.

## 2023-07-30 ENCOUNTER — Emergency Department
Admission: EM | Admit: 2023-07-30 | Discharge: 2023-07-30 | Disposition: A | Discharge status: Home / Self Care | Payer: MEDICAID | Attending: Emergency Medicine | Admitting: Emergency Medicine

## 2023-07-30 ENCOUNTER — Other Ambulatory Visit: Payer: Self-pay

## 2023-07-30 DIAGNOSIS — E119 Type 2 diabetes mellitus without complications: Secondary | ICD-10-CM | POA: Insufficient documentation

## 2023-07-30 DIAGNOSIS — G6289 Other specified polyneuropathies: Secondary | ICD-10-CM

## 2023-07-30 DIAGNOSIS — G629 Polyneuropathy, unspecified: Secondary | ICD-10-CM | POA: Diagnosis not present

## 2023-07-30 DIAGNOSIS — R202 Paresthesia of skin: Secondary | ICD-10-CM | POA: Diagnosis present

## 2023-07-30 LAB — COMPREHENSIVE METABOLIC PANEL
ALT: 15 U/L (ref 0–44)
AST: 17 U/L (ref 15–41)
Albumin: 3.5 g/dL (ref 3.5–5.0)
Alkaline Phosphatase: 59 U/L (ref 38–126)
Anion gap: 6 (ref 5–15)
BUN: 8 mg/dL (ref 6–20)
CO2: 26 mmol/L (ref 22–32)
Calcium: 8.8 mg/dL — ABNORMAL LOW (ref 8.9–10.3)
Chloride: 101 mmol/L (ref 98–111)
Creatinine, Ser: 0.86 mg/dL (ref 0.61–1.24)
GFR, Estimated: >60 mL/min (ref >60)
Glucose, Bld: 211 mg/dL — ABNORMAL HIGH (ref 70–99)
Potassium: 4.3 mmol/L (ref 3.5–5.1)
Sodium: 133 mmol/L — ABNORMAL LOW (ref 135–145)
Total Bilirubin: 0.7 mg/dL (ref <1.2)
Total Protein: 6.1 g/dL — ABNORMAL LOW (ref 6.5–8.1)

## 2023-07-30 LAB — CBC WITH DIFFERENTIAL/PLATELET
Abs Immature Granulocytes: 0.01 10*3/uL (ref 0.00–0.07)
Basophils Absolute: 0.1 10*3/uL (ref 0.0–0.1)
Basophils Relative: 1 %
Eosinophils Absolute: 0.2 10*3/uL (ref 0.0–0.5)
Eosinophils Relative: 4 %
HCT: 40.9 % (ref 39.0–52.0)
Hemoglobin: 14.2 g/dL (ref 13.0–17.0)
Immature Granulocytes: 0 %
Lymphocytes Relative: 37 %
Lymphs Abs: 1.9 10*3/uL (ref 0.7–4.0)
MCH: 30.7 pg (ref 26.0–34.0)
MCHC: 34.7 g/dL (ref 30.0–36.0)
MCV: 88.3 fL (ref 80.0–100.0)
Monocytes Absolute: 0.3 10*3/uL (ref 0.1–1.0)
Monocytes Relative: 6 %
Neutro Abs: 2.7 10*3/uL (ref 1.7–7.7)
Neutrophils Relative %: 52 %
Platelets: 210 10*3/uL (ref 150–400)
RBC: 4.63 MIL/uL (ref 4.22–5.81)
RDW: 12.8 % (ref 11.5–15.5)
WBC: 5.1 10*3/uL (ref 4.0–10.5)
nRBC: 0 % (ref 0.0–0.2)

## 2023-07-30 MED ORDER — METFORMIN HCL ER 500 MG PO TB24: 500.0000 mg | ORAL_TABLET | Freq: Every day | ORAL | 2 refills | Status: DC

## 2023-07-30 MED ORDER — KETOROLAC TROMETHAMINE 30 MG/ML IJ SOLN
30.0000 mg | Freq: Once | INTRAMUSCULAR | Status: AC
  Administered 2023-07-30: 30 mg via INTRAMUSCULAR
  Filled 2023-07-30: qty 1

## 2023-07-30 NOTE — ED Triage Notes (Signed)
C/O hands (fingertips) with burning sensation and same sensation to feet. Also c/O right leg burning/aching. Ongoing problem since Last April and current symptoms have been ongoing x several weeks. AAOx3.  Skin warm and dry. NAD

## 2023-07-30 NOTE — Discharge Instructions (Signed)
The most likely cause of your tingling and burning sensation in your hands and feet is due to your high blood sugars over time causing damage to the nerves in your hands and feet causing discomfort.  It is important that you take your metformin for blood sugar control to prevent worsening.  Speak to Dr. Quillian Quince in your appointment tomorrow about starting a nerve pain medication, called gabapentin or any other medications that may be helpful in controlling her symptoms.  Also speak to Dr. Quillian Quince regarding further testing including vitamin deficiencies like B12 that may be contributing to your symptoms.  Thank you for choosing [place] for your health care today!  Please see your primary doctor this week for a follow up appointment.   If you have any new, worsening, or unexpected symptoms call your doctor right away or come back to the emergency department for reevaluation.  It was my pleasure to care for you today.   Daneil Dan [place] Charon, MD

## 2023-07-30 NOTE — ED Provider Notes (Signed)
[facility] Provider Note    Event Date/Time   First MD Initiated Contact with Patient 07/30/23 1355     (approximate)   History   No chief complaint on file.   HPI  Edgar White is a 57 y.o. male   Past medical history of substance use, diabetes noncompliant with his diabetic medications, CVA, who presents the emergency department with worsening of his chronic tingling/burning/discomfort of his fingers and toes.  He has been noncompliant with his metformin.  He denies drug or alcohol use.  He denies any acute injuries.  He denies any other acute medical complaints  He has an appointment with his primary doctor tomorrow.   External Medical Documents Reviewed: Emergency department visit in August 2024 for bilateral hands and feet pain      Physical Exam   Triage Vital Signs: ED Triage Vitals  Encounter Vitals Group     BP 07/30/23 1320 98/68     Systolic BP Percentile --      Diastolic BP Percentile --      Pulse Rate 07/30/23 1320 78     Resp 07/30/23 1320 16     Temp 07/30/23 1320 (!) 97.5 F (36.4 C)     Temp src --      SpO2 07/30/23 1320 99 %     Weight 07/30/23 1242 158 lb 13.8 oz (72.1 kg)     Height --      Head Circumference --      Peak Flow --      Pain Score 07/30/23 1242 10     Pain Loc --      Pain Education --      Exclude from Growth Chart --     Most recent vital signs: Vitals:   07/30/23 1320  BP: 98/68  Pulse: 78  Resp: 16  Temp: (!) 97.5 F (36.4 C)  SpO2: 99%    General: Awake, no distress.  CV:  Good peripheral perfusion. Resp:  Normal effort.  Abd:  No distention.  Other:  Uncomfortable appearing.  Normal vital signs.  Able to range all extremities full active range of motion, no signs of infection to the bilateral hands or feet.   ED Results / Procedures / Treatments   Labs (all labs ordered are listed, but only abnormal results are displayed) Labs Reviewed  COMPREHENSIVE METABOLIC  PANEL - Abnormal; Notable for the following components:      Result Value   Sodium 133 (*)    Glucose, Bld 211 (*)    Calcium 8.8 (*)    Total Protein 6.1 (*)    All other components within normal limits  CBC WITH DIFFERENTIAL/PLATELET  VITAMIN B12     I ordered and reviewed the above labs they are notable for blood sugar is high in the 200s but no signs of DKA.   PROCEDURES:  Critical Care performed: No  Procedures   MEDICATIONS ORDERED IN ED: Medications  ketorolac (TORADOL) 30 MG/ML injection 30 mg (has no administration in time range)   IMPRESSION / MDM / ASSESSMENT AND PLAN / ED COURSE  I reviewed the triage vital signs and the nursing notes.                                Patient's presentation is most consistent with acute presentation with potential threat to life or bodily function.  Differential diagnosis includes, but is  not limited to, peripheral neuropathy due to diabetes, electrolyte disturbances or nutritional disturbances considered but does not appear to be skin infection, trauma, ischemia   The patient is on the cardiac monitor to evaluate for evidence of arrhythmia and/or significant heart rate changes.  MDM:    Patient with ongoing peripheral neuropathy most likely due to uncontrolled diabetes versus nutritional deficiencies.  He is hyperglycemic but otherwise labs appear normal, no focal deficits to suggest stroke, no neck or back pain to suggest spinal cord pathologies, I informed him that he must take his metformin for blood sugar control and given prescription for refill, and discussed the possibility of starting a nerve agent like gabapentin for which he prefers to discuss with his primary doctor tomorrow prior to starting.  Discharge.       FINAL CLINICAL IMPRESSION(S) / ED DIAGNOSES   Final diagnoses:  Other polyneuropathy     Rx / DC Orders   ED Discharge Orders          Ordered    metFORMIN (GLUCOPHAGE-XR) 500 MG 24 hr tablet   Daily        07/30/23 1410             Note:  This document was prepared using Dragon voice recognition software and may include unintentional dictation errors.    Pilar Jarvis, MD 07/30/23 [phone]

## 2023-09-06 ENCOUNTER — Emergency Department (HOSPITAL_COMMUNITY)
Admission: EM | Admit: 2023-09-06 | Discharge: 2023-09-07 | Disposition: A | Discharge status: Home / Self Care | Payer: MEDICAID | Attending: Emergency Medicine | Admitting: Emergency Medicine

## 2023-09-06 ENCOUNTER — Other Ambulatory Visit: Payer: Self-pay

## 2023-09-06 ENCOUNTER — Encounter (HOSPITAL_COMMUNITY): Payer: Self-pay

## 2023-09-06 DIAGNOSIS — Z20822 Contact with and (suspected) exposure to covid-19: Secondary | ICD-10-CM | POA: Diagnosis not present

## 2023-09-06 DIAGNOSIS — R6883 Chills (without fever): Secondary | ICD-10-CM | POA: Insufficient documentation

## 2023-09-06 DIAGNOSIS — R110 Nausea: Secondary | ICD-10-CM | POA: Diagnosis not present

## 2023-09-06 DIAGNOSIS — F17210 Nicotine dependence, cigarettes, uncomplicated: Secondary | ICD-10-CM | POA: Diagnosis not present

## 2023-09-06 DIAGNOSIS — R5381 Other malaise: Secondary | ICD-10-CM | POA: Diagnosis present

## 2023-09-06 DIAGNOSIS — R059 Cough, unspecified: Secondary | ICD-10-CM | POA: Insufficient documentation

## 2023-09-06 DIAGNOSIS — R519 Headache, unspecified: Secondary | ICD-10-CM | POA: Insufficient documentation

## 2023-09-06 DIAGNOSIS — R52 Pain, unspecified: Secondary | ICD-10-CM

## 2023-09-06 DIAGNOSIS — E119 Type 2 diabetes mellitus without complications: Secondary | ICD-10-CM | POA: Insufficient documentation

## 2023-09-06 DIAGNOSIS — Z85828 Personal history of other malignant neoplasm of skin: Secondary | ICD-10-CM | POA: Insufficient documentation

## 2023-09-06 LAB — COMPREHENSIVE METABOLIC PANEL
ALT: 24 U/L (ref 0–44)
AST: 42 U/L — ABNORMAL HIGH (ref 15–41)
Albumin: 3 g/dL — ABNORMAL LOW (ref 3.5–5.0)
Alkaline Phosphatase: 61 U/L (ref 38–126)
Anion gap: 9 (ref 5–15)
BUN: 18 mg/dL (ref 6–20)
CO2: 20 mmol/L — ABNORMAL LOW (ref 22–32)
Calcium: 8.4 mg/dL — ABNORMAL LOW (ref 8.9–10.3)
Chloride: 100 mmol/L (ref 98–111)
Creatinine, Ser: 0.9 mg/dL (ref 0.61–1.24)
GFR, Estimated: >60 mL/min (ref >60)
Glucose, Bld: 110 mg/dL — ABNORMAL HIGH (ref 70–99)
Potassium: 3.6 mmol/L (ref 3.5–5.1)
Sodium: 129 mmol/L — ABNORMAL LOW (ref 135–145)
Total Bilirubin: 0.7 mg/dL (ref 0.0–1.2)
Total Protein: 5.8 g/dL — ABNORMAL LOW (ref 6.5–8.1)

## 2023-09-06 LAB — URINALYSIS, ROUTINE W REFLEX MICROSCOPIC
Bilirubin Urine: NEGATIVE
Glucose, UA: NEGATIVE mg/dL
Hgb urine dipstick: NEGATIVE
Ketones, ur: 5 mg/dL — AB
Leukocytes,Ua: NEGATIVE
Nitrite: NEGATIVE
Protein, ur: 30 mg/dL — AB
Specific Gravity, Urine: 1.034 — ABNORMAL HIGH (ref 1.005–1.030)
pH: 5 (ref 5.0–8.0)

## 2023-09-06 LAB — CBC WITH DIFFERENTIAL/PLATELET
Abs Immature Granulocytes: 0.01 10*3/uL (ref 0.00–0.07)
Basophils Absolute: 0 10*3/uL (ref 0.0–0.1)
Basophils Relative: 1 %
Eosinophils Absolute: 0 10*3/uL (ref 0.0–0.5)
Eosinophils Relative: 1 %
HCT: 39.3 % (ref 39.0–52.0)
Hemoglobin: 13.9 g/dL (ref 13.0–17.0)
Immature Granulocytes: 1 %
Lymphocytes Relative: 27 %
Lymphs Abs: 0.6 10*3/uL — ABNORMAL LOW (ref 0.7–4.0)
MCH: 31 pg (ref 26.0–34.0)
MCHC: 35.4 g/dL (ref 30.0–36.0)
MCV: 87.5 fL (ref 80.0–100.0)
Monocytes Absolute: 0.1 10*3/uL (ref 0.1–1.0)
Monocytes Relative: 6 %
Neutro Abs: 1.4 10*3/uL — ABNORMAL LOW (ref 1.7–7.7)
Neutrophils Relative %: 64 %
Platelets: 106 10*3/uL — ABNORMAL LOW (ref 150–400)
RBC: 4.49 MIL/uL (ref 4.22–5.81)
RDW: 12.1 % (ref 11.5–15.5)
Smear Review: NORMAL
WBC: 2.1 10*3/uL — ABNORMAL LOW (ref 4.0–10.5)
nRBC: 0 % (ref 0.0–0.2)

## 2023-09-06 LAB — I-STAT CHEM 8, ED
BUN: 20 mg/dL (ref 6–20)
Calcium, Ion: 1.11 mmol/L — ABNORMAL LOW (ref 1.15–1.40)
Chloride: 98 mmol/L (ref 98–111)
Creatinine, Ser: 0.9 mg/dL (ref 0.61–1.24)
Glucose, Bld: 109 mg/dL — ABNORMAL HIGH (ref 70–99)
HCT: 37 % — ABNORMAL LOW (ref 39.0–52.0)
Hemoglobin: 12.6 g/dL — ABNORMAL LOW (ref 13.0–17.0)
Potassium: 3.7 mmol/L (ref 3.5–5.1)
Sodium: 132 mmol/L — ABNORMAL LOW (ref 135–145)
TCO2: 22 mmol/L (ref 22–32)

## 2023-09-06 LAB — CBG MONITORING, ED: Glucose-Capillary: 121 mg/dL — ABNORMAL HIGH (ref 70–99)

## 2023-09-06 LAB — CK: Total CK: 85 U/L (ref 49–397)

## 2023-09-06 MED ORDER — OXYCODONE-ACETAMINOPHEN 5-325 MG PO TABS
2.0000 | ORAL_TABLET | Freq: Once | ORAL | Status: AC
  Administered 2023-09-06: 2 via ORAL
  Filled 2023-09-06: qty 2

## 2023-09-06 NOTE — ED Notes (Signed)
 Pt refused COVID swab and stated he just wanted a room.

## 2023-09-06 NOTE — ED Notes (Signed)
 Pt states he is having increase body pain. Rn notified

## 2023-09-06 NOTE — ED Provider Triage Note (Signed)
Emergency Medicine Provider Triage Evaluation Note  Edgar White , a 58 y.o. male  was evaluated in triage.  Pt complains of severe pain all over his body, body aches and chills.  He has a history of diabetes has been sleeping in his car in cold weather.  He denies cough nausea or vomiting.  Review of Systems  Positive: Chills, p[oain  Negative:   Physical Exam  BP 118/71 (BP Location: Right Arm)   Pulse 91   Temp 98.3 F (36.8 C)   Resp 20   Ht 5\' 10"  (1.778 m)   SpO2 99%   BMI 22.79 kg/m  Gen:   Awake, no distress   Resp:  Normal effort  MSK:   Moves extremities without difficulty  Other:    Medical Decision Making  Medically screening exam initiated at 1:12 PM.  Appropriate orders placed.  Edgar White was informed that the remainder of the evaluation will be completed by another provider, this initial triage assessment does not replace that evaluation, and the importance of remaining in the ED until their evaluation is complete.     Arthor Captain, PA-C 09/06/23 1314

## 2023-09-06 NOTE — ED Triage Notes (Signed)
 Pt came to ED for flu sx that started Saturday. Body aches and diarrhea/ headache. Denies fevers.

## 2023-09-06 NOTE — ED Notes (Signed)
 Pt has placed himself in the floor in the lobby multiple times. Pt helped to the wheelchair and placed in front of the nurses station. Pt states that he is having a seizure. Pt is alert and oriented x4, airway intact.

## 2023-09-07 ENCOUNTER — Encounter: Payer: Self-pay | Admitting: Emergency Medicine

## 2023-09-07 ENCOUNTER — Other Ambulatory Visit: Payer: Self-pay

## 2023-09-07 DIAGNOSIS — Z1152 Encounter for screening for COVID-19: Secondary | ICD-10-CM | POA: Insufficient documentation

## 2023-09-07 DIAGNOSIS — G629 Polyneuropathy, unspecified: Secondary | ICD-10-CM | POA: Insufficient documentation

## 2023-09-07 DIAGNOSIS — M79642 Pain in left hand: Secondary | ICD-10-CM | POA: Diagnosis present

## 2023-09-07 LAB — RAPID HIV SCREEN (HIV 1/2 AB+AG)
HIV 1/2 Antibodies: REACTIVE — AB
HIV-1 P24 Antigen - HIV24: NONREACTIVE

## 2023-09-07 LAB — RESP PANEL BY RT-PCR (RSV, FLU A&B, COVID)  RVPGX2
Influenza A by PCR: NEGATIVE
Influenza A by PCR: NEGATIVE
Influenza B by PCR: NEGATIVE
Influenza B by PCR: NEGATIVE
Resp Syncytial Virus by PCR: NEGATIVE
Resp Syncytial Virus by PCR: NEGATIVE
SARS Coronavirus 2 by RT PCR: NEGATIVE
SARS Coronavirus 2 by RT PCR: NEGATIVE

## 2023-09-07 MED ORDER — ONDANSETRON HCL 4 MG/2ML IJ SOLN
4.0000 mg | Freq: Once | INTRAMUSCULAR | Status: AC
  Administered 2023-09-07: 4 mg via INTRAVENOUS
  Filled 2023-09-07: qty 2

## 2023-09-07 MED ORDER — KETOROLAC TROMETHAMINE 15 MG/ML IJ SOLN
15.0000 mg | Freq: Once | INTRAMUSCULAR | Status: AC
  Administered 2023-09-07: 15 mg via INTRAVENOUS
  Filled 2023-09-07: qty 1

## 2023-09-07 MED ORDER — NAPROXEN 500 MG PO TABS: 500.0000 mg | ORAL_TABLET | Freq: Two times a day (BID) | ORAL | 0 refills | Status: DC

## 2023-09-07 MED ORDER — SODIUM CHLORIDE 0.9 % IV BOLUS
1000.0000 mL | Freq: Once | INTRAVENOUS | Status: AC
  Administered 2023-09-07: 1000 mL via INTRAVENOUS

## 2023-09-07 MED ORDER — METHOCARBAMOL 500 MG PO TABS
500.0000 mg | ORAL_TABLET | Freq: Three times a day (TID) | ORAL | 0 refills | Status: DC | PRN
Start: 2023-09-07 — End: 2023-09-25

## 2023-09-07 NOTE — ED Triage Notes (Addendum)
Patient states he's been experiencing bilateral hand and feet pain for months. Was at [facility] last night was given Toradol with no relief. States he has neuropathy. Crying in triage. Stating whole body aches, feels warm.

## 2023-09-07 NOTE — Discharge Instructions (Signed)
You were evaluated in the Emergency Department and after careful evaluation, we did not find any emergent condition requiring admission or further testing in the hospital.  Your exam/testing today is overall reassuring.  Recommend following up on your HIV testing on MyChart and following up with the primary care doctor.  You may need repeat testing at a later date.  Please return to the Emergency Department if you experience any worsening of your condition.   Thank you for allowing [place] to be a part of your care.

## 2023-09-07 NOTE — ED Triage Notes (Signed)
EMS brings pt in from [facility] for c/o pain to hands & feet; seen at [facility] last night for same

## 2023-09-07 NOTE — ED Provider Notes (Signed)
[facility] Emergency Department Provider Note MRN:  [id number]  Arrival date & time: 09/07/23     Chief Complaint   Headache and Nausea   History of Present Illness   Edgar White is a 58 y.o. year-old male with a history of diabetes, seizure disorder presenting to the ED with chief complaint of headache and nausea.  Patient explains that he feels terrible.  Whole body hurts, skin hurts, feels like he has a bug like the flu.  Has recently tested negative for COVID and flu.  Denies specific abdominal pain, no chest pain or shortness of breath, has headache, has had recent cough and chills.  Denies rash, currently without fever, no burning with urination.  Review of Systems  A thorough review of systems was obtained and all systems are negative except as noted in the HPI and PMH.   Patient's Health History    Past Medical History:  Diagnosis Date   Basal cell carcinoma 06/22/2022   L medial post shoulder, EDC   Basal cell carcinoma 06/22/2022   L spinal mid back, EDC   Basal cell carcinoma 06/22/2022   L spinal upper back, ED[facility]   Basal cell carcinoma 06/22/2022   R nasal ala, MOHs completed 08/09/22   Basal cell carcinoma 06/22/2022   R upper back, EDC   Depression    Diabetes mellitus without complication (HCC)    Seizures (HCC)     Past Surgical History:  Procedure Laterality Date   INGUINAL HERNIA REPAIR     LUMBAR FUSION      Family History  Problem Relation Age of Onset   Cancer Mother    Basal cell carcinoma Sister    Squamous cell carcinoma Sister     Social History   Socioeconomic History   Marital status: Single    Spouse name: Not on file   Number of children: Not on file   Years of education: Not on file   Highest education level: Not on file  Occupational History   Not on file  Tobacco Use   Smoking status: Every Day    Current packs/day: 1.00    Average packs/day: 1 pack/day for 26.0 years (26.0 ttl pk-yrs)     Types: Cigarettes    Start date: 1999   Smokeless tobacco: Current  Vaping Use   Vaping status: Never Used  Substance and Sexual Activity   Alcohol use: No    Comment: denies use   Drug use: Not Currently    Types: Marijuana, Cocaine    Comment: marijuana currently, no recent cocaine use   Sexual activity: Never  Other Topics Concern   Not on file  Social History Narrative   Right handed   Lives alone   Caffeine 3 cups daily   Social Drivers of Health   Financial Resource Strain: Not on file  Food Insecurity: No Food Insecurity (06/12/2023)   Hunger Vital Sign    Worried About Running Out of Food in the Last Year: Never true    Ran Out of Food in the Last Year: Never true  Transportation Needs: No Transportation Needs (06/12/2023)   PRAPARE - Administrator, Civil Service (Medical): No    Lack of Transportation (Non-Medical): No  Physical Activity: Not on file  Stress: Not on file  Social Connections: Not on file  Intimate Partner Violence: Not At Risk (06/12/2023)   Humiliation, Afraid, Rape, and Kick questionnaire    Fear of Current or Ex-Partner: No  Emotionally Abused: No    Physically Abused: No    Sexually Abused: No     Physical Exam   Vitals:   09/06/23 2000 09/07/23 0213  BP: 118/72 112/60  Pulse: 93 83  Resp: 18 16  Temp:  98.9 F (37.2 C)  SpO2: 97% 100%    CONSTITUTIONAL: Well-appearing, NAD NEURO/PSYCH:  Alert and oriented x 3, no focal deficits EYES:  eyes equal and reactive ENT/NECK:  no LAD, no JVD CARDIO: Regular rate, well-perfused, normal S1 and S2 PULM:  CTAB no wheezing or rhonchi GI/GU:  non-distended, non-tender MSK/SPINE:  No gross deformities, no edema SKIN:  no rash, atraumatic   *Additional and/or pertinent findings included in MDM below  Diagnostic and Interventional Summary    EKG Interpretation Date/Time:    Ventricular Rate:    PR Interval:    QRS Duration:    QT Interval:    QTC Calculation:   R  Axis:      Text Interpretation:         Labs Reviewed  CBC WITH DIFFERENTIAL/PLATELET - Abnormal; Notable for the following components:      Result Value   WBC 2.1 (*)    Platelets 106 (*)    Neutro Abs 1.4 (*)    Lymphs Abs 0.6 (*)    All other components within normal limits  COMPREHENSIVE METABOLIC PANEL - Abnormal; Notable for the following components:   Sodium 129 (*)    CO2 20 (*)    Glucose, Bld 110 (*)    Calcium 8.4 (*)    Total Protein 5.8 (*)    Albumin 3.0 (*)    AST 42 (*)    All other components within normal limits  URINALYSIS, ROUTINE W REFLEX MICROSCOPIC - Abnormal; Notable for the following components:   Color, Urine AMBER (*)    APPearance HAZY (*)    Specific Gravity, Urine 1.034 (*)    Ketones, ur 5 (*)    Protein, ur 30 (*)    Bacteria, UA RARE (*)    All other components within normal limits  I-STAT CHEM 8, ED - Abnormal; Notable for the following components:   Sodium 132 (*)    Glucose, Bld 109 (*)    Calcium, Ion 1.11 (*)    Hemoglobin 12.6 (*)    HCT 37.0 (*)    All other components within normal limits  CBG MONITORING, ED - Abnormal; Notable for the following components:   Glucose-Capillary 121 (*)    All other components within normal limits  RESP PANEL BY RT-PCR (RSV, FLU A&B, COVID)  RVPGX2  CK  RAPID HIV SCREEN (HIV 1/2 AB+AG)    No orders to display    Medications  oxyCODONE-acetaminophen (PERCOCET/ROXICET) 5-325 MG per tablet 2 tablet (2 tablets Oral Given 09/06/23 1322)  sodium chloride 0.9 % bolus 1,000 mL (0 mLs Intravenous Stopped 09/07/23 0312)  ondansetron (ZOFRAN) injection 4 mg (4 mg Intravenous Given 09/07/23 0112)  ketorolac (TORADOL) 15 MG/ML injection 15 mg (15 mg Intravenous Given 09/07/23 0114)     Procedures  /  Critical Care Procedures  ED Course and Medical Decision Making  Initial Impression and Ddx Suspect viral illness causing body aches, headache, cough, malaise, fatigue.  Patient feels very dehydrated.   Reassuring vital signs, soft abdomen, clear lungs, no increased work of breathing, no meningismus, reassuring neurological exam.  Patient is intermittently tearful, was in the waiting room for 12 hours and is exhausted.  Electrolyte disturbance is considered as  well providing fluids, awaiting labs.  Past medical/surgical history that increases complexity of ED encounter: None  Interpretation of Diagnostics I personally reviewed the laboratory assessment and my interpretation is as follows: Mild hyponatremia, otherwise no significant blood count or electrolyte disturbance    Patient Reassessment and Ultimate Disposition/Management     Patient resting comfortably on reassessment, continued reassuring exam.  Requesting HIV testing, started feeling unwell about 6 days after sexual contact.  Advised to follow-up with his test results and primary care doctor.  No emergent process, no indication for further testing or admission.  Patient management required discussion with the following services or consulting groups:  None  Complexity of Problems Addressed Acute illness or injury that poses threat of life of bodily function  Additional Data Reviewed and Analyzed Further history obtained from: None  Additional Factors Impacting ED Encounter Risk None  Elmer Sow. Pilar Plate, MD [facility] Emergency Medicine [facility] [email or web address]  Final Clinical Impressions(s) / ED Diagnoses     ICD-10-CM   1. Malaise  R53.81     2. Body aches  R52       ED Discharge Orders     None        Discharge Instructions Discussed with and Provided to Patient:     Discharge Instructions      You were evaluated in the Emergency Department and after careful evaluation, we did not find any emergent condition requiring admission or further testing in the hospital.  Your exam/testing today is overall reassuring.  Recommend following up on your HIV testing on MyChart and following  up with the primary care doctor.  You may need repeat testing at a later date.  Please return to the Emergency Department if you experience any worsening of your condition.   Thank you for allowing [place] to be a part of your care.       Sabas Sous, MD 09/07/23 [phone]

## 2023-09-08 ENCOUNTER — Emergency Department
Admission: EM | Admit: 2023-09-08 | Discharge: 2023-09-08 | Disposition: A | Discharge status: Home / Self Care | Payer: MEDICAID | Attending: Emergency Medicine | Admitting: Emergency Medicine

## 2023-09-08 DIAGNOSIS — G629 Polyneuropathy, unspecified: Secondary | ICD-10-CM

## 2023-09-08 MED ORDER — GABAPENTIN 100 MG PO CAPS: 100.0000 mg | ORAL_CAPSULE | Freq: Three times a day (TID) | ORAL | 2 refills | Status: DC

## 2023-09-08 NOTE — ED Notes (Signed)
See triage notes. Patient c/o "a bug and neuropathy." Patient stated his hands and feet are numb and "popping off." Patient stated, "Francesca Oman are going to let me die. I haven't ate since Sunday." Patient c/o body aches.

## 2023-09-08 NOTE — ED Provider Notes (Signed)
[facility] Provider Note    Event Date/Time   First MD Initiated Contact with Patient 09/08/23 0900     (approximate)   History   Hand Pain and Foot Pain   HPI  Edgar White is a 58 y.o. male with a history of neuropathy presents with complaints of pain in his hands and his feet consistent with his typical neuropathy.  Review of records demonstrates significant noncompliance, seen at [facility] emergency department yesterday had extensive workup and treatment.  Promptly came over here after being discharged.  No different complaints.  Is not on gabapentin     Physical Exam   Triage Vital Signs: ED Triage Vitals  Encounter Vitals Group     BP 09/07/23 2239 106/73     Systolic BP Percentile --      Diastolic BP Percentile --      Pulse Rate 09/07/23 2239 90     Resp 09/07/23 2239 18     Temp 09/07/23 2242 98.7 F (37.1 C)     Temp Source 09/07/23 2242 Oral     SpO2 09/07/23 2239 99 %     Weight 09/07/23 2240 72 kg (158 lb 11.7 oz)     Height 09/07/23 2240 1.778 m (5\' 10" )     Head Circumference --      Peak Flow --      Pain Score 09/07/23 2240 10     Pain Loc --      Pain Education --      Exclude from Growth Chart --     Most recent vital signs: Vitals:   09/08/23 0119 09/08/23 0557  BP: 111/61 100/64  Pulse: 81 84  Resp: 19 19  Temp: 98.6 F (37 C) 98.5 F (36.9 C)  SpO2: 99% 100%     General: Awake, no distress.  CV:  Good peripheral perfusion.  Resp:  Normal effort.  Abd:  No distention.  Other:  Warm and well-perfused distally   ED Results / Procedures / Treatments   Labs (all labs ordered are listed, but only abnormal results are displayed) Labs Reviewed  RESP PANEL BY RT-PCR (RSV, FLU A&B, COVID)  RVPGX2     EKG     RADIOLOGY     PROCEDURES:  Critical Care performed:   Procedures   MEDICATIONS ORDERED IN ED: Medications - No data to display   IMPRESSION / MDM / ASSESSMENT AND PLAN / ED  COURSE  I reviewed the triage vital signs and the nursing notes. Patient's presentation is most consistent with exacerbation of chronic illness.  Patient with a history of neuropathy presents with pain tingling in the hands and feet consistent with his neuropathy.  Overall he is well-appearing and otherwise in no acute distress.  Vital signs are reassuring.  Reviewed ED records from yesterday.  Reviewed ED labs from yesterday.  Patient is not on gabapentin, will start him on this as a possible relief from his neuropathy        FINAL CLINICAL IMPRESSION(S) / ED DIAGNOSES   Final diagnoses:  Neuropathy     Rx / DC Orders   ED Discharge Orders          Ordered    gabapentin (NEURONTIN) 100 MG capsule  3 times daily        09/08/23 0915             Note:  This document was prepared using Dragon voice recognition software and may include  unintentional dictation errors.   Jene Every, MD 09/08/23 [phone]

## 2023-09-10 LAB — HIV-1/2 AB - DIFFERENTIATION
HIV 1 Ab: NONREACTIVE
HIV 2 Ab: NONREACTIVE
Note: NEGATIVE

## 2023-09-11 ENCOUNTER — Telehealth: Payer: Self-pay | Admitting: Infectious Disease

## 2023-09-11 LAB — HIV-1/HIV-2 QUALITATIVE RNA
HIV-1 RNA, Qualitative: REACTIVE — AB
HIV-2 RNA, Qualitative: NONREACTIVE

## 2023-09-11 NOTE — Telephone Encounter (Signed)
Patient with labs consistent with ACUTE HIV infection with negative confirmatory assay --> qual RNA which means patient will have a quite high viral load in millinos of copies and highly infectious to others thru sex  He needs to get into care at our clinics and do rapid start of an biktarvy vs Dovato  Dr. Clifton James has ER reached out to pt re test yet?  I am ccing our staff May have to use DIS but if so he should be a HIGH priority

## 2023-09-12 NOTE — Telephone Encounter (Signed)
Spoke with Barbara Cower at [facility], the case has been assigned to [place]. They have patient's phone number and emergency contact info on file. They are aware of likely acute infection and are working to locate him.   Sandie Ano, RN

## 2023-09-13 ENCOUNTER — Telehealth: Payer: Self-pay | Admitting: Infectious Disease

## 2023-09-13 NOTE — Telephone Encounter (Signed)
Looks like ANOTHER acute HIV infection

## 2023-09-14 ENCOUNTER — Other Ambulatory Visit (HOSPITAL_COMMUNITY): Payer: Self-pay

## 2023-09-14 ENCOUNTER — Telehealth: Payer: Self-pay

## 2023-09-14 NOTE — Telephone Encounter (Signed)
Pharmacy Patient Advocate Encounter  Insurance verification completed.   The patient is insured through [place] Medicaid   Ran test claim for [employer]. Currently a quantity of 30 is a 30 day supply and the co-pay is 0.00 .   This test claim was processed through [facility]- copay amounts may vary at other pharmacies due to pharmacy/plan contracts, or as the patient moves through the different stages of their insurance plan.

## 2023-09-14 NOTE — Telephone Encounter (Signed)
Notified by Dr. Luciana Axe that Dr. Quillian Quince is trying to connect patient to care.   Called and spoke with Dr. Quillian Quince, she had [name] in her office and put him on the phone. Edgar White was just notified of his diagnosis about an hour ago.   Discussed next steps including appointment and starting treatment. Notified him that [facility] will likely be trying to reach him and verified phone number.   Spoke with Barbara Cower at [facility] and notified him that patient is scheduled for new patient appointment next week.   Sandie Ano, RN

## 2023-09-18 ENCOUNTER — Ambulatory Visit: Payer: Self-pay

## 2023-09-18 ENCOUNTER — Ambulatory Visit: Payer: Self-pay | Admitting: Pharmacist

## 2023-09-18 ENCOUNTER — Encounter: Payer: MEDICAID | Admitting: Internal Medicine

## 2023-09-18 ENCOUNTER — Telehealth: Payer: Self-pay

## 2023-09-18 NOTE — Telephone Encounter (Signed)
Called patient due to missed appointment today -- left voicemail asking patient to return my call.   Wynne Jury Lesli Albee, CMA

## 2023-09-21 ENCOUNTER — Ambulatory Visit (INDEPENDENT_AMBULATORY_CARE_PROVIDER_SITE_OTHER): Payer: MEDICAID | Admitting: Internal Medicine

## 2023-09-21 ENCOUNTER — Encounter: Payer: Self-pay | Admitting: Internal Medicine

## 2023-09-21 ENCOUNTER — Ambulatory Visit (INDEPENDENT_AMBULATORY_CARE_PROVIDER_SITE_OTHER): Payer: MEDICAID | Admitting: Pharmacist

## 2023-09-21 ENCOUNTER — Other Ambulatory Visit: Payer: Self-pay

## 2023-09-21 VITALS — BP 115/75 | HR 93 | Temp 98.3°F | Ht 70.0 in | Wt 153.0 lb

## 2023-09-21 DIAGNOSIS — B20 Human immunodeficiency virus [HIV] disease: Secondary | ICD-10-CM | POA: Diagnosis not present

## 2023-09-21 DIAGNOSIS — R7989 Other specified abnormal findings of blood chemistry: Secondary | ICD-10-CM | POA: Diagnosis not present

## 2023-09-21 DIAGNOSIS — Z1159 Encounter for screening for other viral diseases: Secondary | ICD-10-CM

## 2023-09-21 DIAGNOSIS — E871 Hypo-osmolality and hyponatremia: Secondary | ICD-10-CM

## 2023-09-21 DIAGNOSIS — D696 Thrombocytopenia, unspecified: Secondary | ICD-10-CM | POA: Diagnosis not present

## 2023-09-21 DIAGNOSIS — Z113 Encounter for screening for infections with a predominantly sexual mode of transmission: Secondary | ICD-10-CM

## 2023-09-21 MED ORDER — BICTEGRAVIR-EMTRICITAB-TENOFOV 50-200-25 MG PO TABS: 1.0000 | ORAL_TABLET | Freq: Every day | ORAL | 11 refills | Status: DC

## 2023-09-21 MED ORDER — BICTEGRAVIR-EMTRICITAB-TENOFOV 50-200-25 MG PO TABS: 1.0000 | ORAL_TABLET | Freq: Every day | ORAL | 3 refills | Status: DC

## 2023-09-21 NOTE — Progress Notes (Signed)
HPI: Edgar White is a 58 y.o. male who presents to the [facility] pharmacy clinic for HIV follow-up.  Patient Active Problem List   Diagnosis Date Noted   Major depression with psychotic features (HCC) 06/12/2023   Suicidal thoughts 06/11/2023   Abnormal movement 04/05/2023   Stroke-like symptoms 01/07/2023   Type 2 diabetes mellitus without complications (HCC) 01/07/2023   Depression 01/07/2023   Cerebrovascular accident (CVA) (HCC) 01/07/2023   Ataxia 01/07/2023   Cannabis use disorder, moderate, dependence (HCC) 11/04/2016   Tobacco use disorder 11/04/2016   Severe recurrent major depression without psychotic features (HCC) 11/04/2016   Substance induced mood disorder (HCC) 10/13/2016   Diabetes mellitus without complication (HCC) 06/08/2016   Anxiety state    Substance abuse (HCC)    Major depressive disorder, 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   ATORVASTATIN (LIPITOR) 20 MG TABLET    Take 1 tablet (20 mg total) by mouth daily.   BICTEGRAVIR-EMTRICITABINE-TENOFOVIR AF (BIKTARVY) 50-200-25 MG TABS TABLET    Take 1 tablet by mouth daily.   CLONAZEPAM (KLONOPIN) 0.5 MG TABLET    Take 1 tablet (0.5 mg total) by mouth 2 (two) times daily.   DIVALPROEX (DEPAKOTE) 500 MG DR TABLET    Take 1 tablet (500 mg total) by mouth every 12 (twelve) hours.   GABAPENTIN (NEURONTIN) 100 MG CAPSULE    Take 1 capsule (100 mg total) by mouth 3 (three) times daily.   HYDROXYZINE (ATARAX) 25 MG TABLET    Take 1 tablet (25 mg total) by mouth every 6 (six) hours.   IRBESARTAN (AVAPRO) 75 MG TABLET    Take 1 tablet (75 mg total) by mouth daily.   METFORMIN (GLUCOPHAGE-XR) 500 MG 24 HR TABLET    Take 1 tablet (500 mg total) by mouth daily.   METHOCARBAMOL (ROBAXIN) 500 MG TABLET    Take 1  tablet (500 mg total) by mouth every 8 (eight) hours as needed for muscle spasms.   NAPROXEN (NAPROSYN) 500 MG TABLET    Take 1 tablet (500 mg total) by mouth 2 (two) times daily.   PALIPERIDONE (INVEGA) 3 MG 24 HR TABLET    Take 1 tablet (3 mg total) by mouth daily.  Modified Medications   No medications on file  Discontinued Medications   No medications on file    Labs: No results found for: "HIV1RNAQUANT", "HIV1RNAVL", "CD4TABS"  RPR and STI Lab Results  Component Value Date   LABRPR Non Reactive 04/05/2023        No data to display          Hepatitis B Lab Results  Component Value Date   HEPBSAG Negative 04/05/2023   Hepatitis C No results found for: "HEPCAB", "HCVRNAPCRQN" Hepatitis A No results found for: "HAV" Lipids: Lab Results  Component Value Date   CHOL 177 06/14/2023   TRIG 315 (H) 06/14/2023   HDL 33 (L) 06/14/2023   CHOLHDL 5.4 06/14/2023   VLDL 63 (H) 06/14/2023   LDLCALC 81 06/14/2023    Current HIV Regimen: Biktarvy  Assessment: [name] presents to [facility] clinic today for his first visit post HIV diagnosis on 09/06/23. He was seen by Dr. Renold Don today at 9 am. We met the patient during his visit with Dr. Renold Don, introduced the  pharmacy staff, and discussed the support/services the [facility] pharmacy team provides. He was given Bonna Gains, RPH-CPP, contact information and instructed him to contact us with any questions or concerns. He was started on Biktarvy 1 tablet once daily today. Insurance coverage investigation revealed a $0 copay for this medication for him.   Counseled him on Biktarvy. Instructed him to take 1 tablet once daily. Emphasized the importance of optimal adherence. Instructed him to avoid taking his Biktarvy inconsistently or rationing his supply since doing so would increase his risk of developing HIV mutations/resistance. Discussed potential side effects associated with Biktarvy such as nausea, diarrhea, or weight gain. Informed him side  effects such as nausea and diarrhea may be due to initiation syndrome and will most likely go away after a few days. If these side effects become unbearable, contact [facility] pharmacy. Monitor weight regularly and inform us if significant weight gain is experienced. Also instructed him to administer Biktravy with or without food and administer 2 hours before or 6 hours after aluminum- or magnesium-containing antacids. Finally, we instructed him to contact us before starting any new medications/supplements so we can conduct a drug-drug interaction analysis, ensuring no significant drug-drug interactions occur with his Biktarvy.   Current home medications: gabapentinand metformin. No significant drug-drug interactions were identified.   Plan: - Introduced him to [employer] clinic and the services/support we provide - Counseled on [place] - Call with any questions or concerns   Dimple Casey. Edwena Blow, PharmD Candidate [employer] 09/21/2023, 11:27 AM

## 2023-09-21 NOTE — Patient Instructions (Signed)
Lots of labs today    Will eventually talk about injectable once you are controlled For today will start biktarvy   See [place] in 4-6 weeks   No sex for the next 3 months or until you are undetectable

## 2023-09-21 NOTE — Addendum Note (Signed)
Addended by: Harley Alto on: 09/21/2023 10:54 AM   Modules accepted: Orders

## 2023-09-21 NOTE — Progress Notes (Signed)
[facility]  Reason for Consult:acute hiv infection Referring Provider: self    Patient Active Problem List   Diagnosis Date Noted   Major depression with psychotic features (HCC) 06/12/2023   Suicidal thoughts 06/11/2023   Abnormal movement 04/05/2023   Stroke-like symptoms 01/07/2023   Type 2 diabetes mellitus without complications (HCC) 01/07/2023   Depression 01/07/2023   Cerebrovascular accident (CVA) (HCC) 01/07/2023   Ataxia 01/07/2023   Cannabis use disorder, moderate, dependence (HCC) 11/04/2016   Tobacco use disorder 11/04/2016   Severe recurrent major depression without psychotic features (HCC) 11/04/2016   Substance induced mood disorder (HCC) 10/13/2016   Diabetes mellitus without complication (HCC) 06/08/2016   Anxiety state    Substance abuse (HCC)    Major depressive disorder, recurrent severe without psychotic features (HCC) 04/13/2016      HPI: Edgar White is a 58 y.o. 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 Hiv testing showed negative antibody screen but positive RNA suggestive window period  He was contacted by Dr Daiva Eves and referred to rcid   He thinks he has exposure 08/17/23. He has been seeing a guy who was doing crystal meth and he joined smoking crystal meth, for the past 6 months; he only had sex 08/17/23. He also had oral sex with another person first week jan 2025. Both people were hiv positive -- they told him they were undetectable but he doesn't know that for a fact; he doesn't know what they have been taking for ART. He has always been "safe" but the 08/17/23 was a bad judgement  No rash, but had myalgia, malaise, couldn't walk, decreased appetite, rhinorrhea/nasal congestion, headache,  diarrhea/nausea, cough.  He is feeling better with energy/appetite  He is single.   He hasn't been taking PrEP  Other substance: Daily marijuana - neuropathy/seizure 2024 intermittent smoking methamphetamine No hx IVDU/INDU Tobacco -- 1 ppd; started at age 73 off and on No EtOH   Social: [employer], [place] Used to live in [place] No prior travel outside of the [place] Prior travel [place] area; no cocci land travel hx 3 siblings in [place] MSM - oral/anal Work -- Pensions consultant -- like to [employer]    Review of Systems: ROS All other ros negative   Meds: Current Outpatient Medications on File Prior to Visit  Medication Sig Dispense Refill   acetaminophen (TYLENOL) 500 MG tablet Take 500 mg by mouth every 6 (six) hours as needed.     gabapentin (NEURONTIN) 100 MG capsule Take 1 capsule (100 mg total) by mouth 3 (three) times daily. 90 capsule 2   metFORMIN (GLUCOPHAGE-XR) 500 MG 24 hr tablet Take 1 tablet (500 mg total) by mouth daily. 30 tablet 2   atorvastatin (LIPITOR) 20 MG tablet Take 1 tablet (20 mg total) by mouth daily. (Patient not taking: Reported on 06/27/2023) 30 tablet 2   clonazePAM (KLONOPIN) 0.5 MG tablet Take 1 tablet (0.5 mg total) by mouth 2 (two) times daily. (Patient not taking: Reported on 09/21/2023) 60 tablet 0   divalproex (DEPAKOTE) 500 MG DR tablet Take 1 tablet (500 mg total) by mouth every 12 (twelve) hours. (Patient not taking: Reported on 09/21/2023) 60 tablet 0   hydrOXYzine (ATARAX) 25 MG tablet Take 1 tablet (  25 mg total) by mouth every 6 (six) hours. (Patient not taking: Reported on 09/21/2023) 12 tablet 0   irbesartan (AVAPRO) 75 MG tablet Take 1 tablet (75 mg total) by mouth daily. (Patient not taking: Reported on 06/27/2023) 30 tablet 2   methocarbamol (ROBAXIN) 500 MG tablet Take 1 tablet (500 mg total) by mouth every 8 (eight) hours as needed for muscle spasms. (Patient not taking: Reported on 09/21/2023) 30 tablet 0   naproxen  (NAPROSYN) 500 MG tablet Take 1 tablet (500 mg total) by mouth 2 (two) times daily. (Patient not taking: Reported on 09/21/2023) 30 tablet 0   paliperidone (INVEGA) 3 MG 24 hr tablet Take 1 tablet (3 mg total) by mouth daily. (Patient not taking: Reported on 06/27/2023) 30 tablet 0   No current facility-administered medications on file prior to visit.      Past Medical History:  Diagnosis Date   Basal cell carcinoma 06/22/2022   L medial post shoulder, [facility]   Basal cell carcinoma 06/22/2022   L spinal mid back, [facility]   Basal cell carcinoma 06/22/2022   L spinal upper back, [facility]   Basal cell carcinoma 06/22/2022   R nasal ala, MOHs completed 08/09/22   Basal cell carcinoma 06/22/2022   R upper back, [facility]   Depression    Diabetes mellitus without complication (HCC)    Seizures (HCC)     Social History   Tobacco Use   Smoking status: Every Day    Current packs/day: 1.00    Average packs/day: 1 pack/day for 26.1 years (26.1 ttl pk-yrs)    Types: Cigarettes    Start date: 1999   Smokeless tobacco: Current  Vaping Use   Vaping status: Never Used  Substance Use Topics   Alcohol use: No    Comment: denies use   Drug use: Not Currently    Types: Marijuana, Cocaine    Comment: marijuana currently, no recent cocaine use    Family History  Problem Relation Age of Onset   Cancer Mother    Basal cell carcinoma Sister    Squamous cell carcinoma Sister     No Known Allergies  OBJECTIVE: Vitals:   09/21/23 0908  Weight: 153 lb (69.4 kg)  Height: 5\' 10"  (1.778 m)   Body mass index is 21.95 kg/m.   Physical Exam General/constitutional: no distress, pleasant HEENT: Normocephalic, PER, Conj Clear, EOMI, Oropharynx clear Neck supple CV: rrr no mrg Lungs: clear to auscultation, normal respiratory effort Abd: Soft, Nontender Ext: no edema Skin: No Rash Neuro: nonfocal MSK: no peripheral joint swelling/tenderness/warmth; back spines nontender   Lab: Lab Results   Component Value Date   WBC 2.1 (L) 09/06/2023   HGB 12.6 (L) 09/06/2023   HCT 37.0 (L) 09/06/2023   MCV 87.5 09/06/2023   PLT 106 (L) 09/06/2023   Last metabolic panel Lab Results  Component Value Date   GLUCOSE 109 (H) 09/06/2023   NA 132 (L) 09/06/2023   K 3.7 09/06/2023   CL 98 09/06/2023   CO2 20 (L) 09/06/2023   BUN 20 09/06/2023   CREATININE 0.90 09/06/2023   GFRNONAA >60 09/06/2023   CALCIUM 8.4 (L) 09/06/2023   PROT 5.8 (L) 09/06/2023   ALBUMIN 3.0 (L) 09/06/2023   LABGLOB 2.9 04/05/2023   BILITOT 0.7 09/06/2023   ALKPHOS 61 09/06/2023   AST 42 (H) 09/06/2023   ALT 24 09/06/2023   ANIONGAP 9 09/06/2023    Microbiology:  Serology:  Imaging:   Assessment/plan: Problem List Items Addressed  This Visit   None Visit Diagnoses       HIV disease (HCC)    -  Primary     Thrombocytopenia (HCC)         LFT elevation         Hyponatremia            #hiv Acute retroviral syndrome Risk msm; dx'ed 08/2023  Discuss natural hx and treatment of hiv He is interested in injectable Discussed other treatment once a day tablet  We'll go with biktarvy. Need hepatitis b screening first  High risk sexual behavior -- said he almost always uses protection until the last time -- will discuss doxy pep    -will discuss reprieve trial in near future -discussed u=u -encourage compliance -rapid start biktarvy for HIV medication -labs today -f/u in 4-6 weeks -pharmacy counseling today -he is on medicaid    #hyponatremia #mild lft elevation Likely related to acute retroviral syndrome vs current medications He reports he has a small brain tumor so ?causing siadh with hyponatremia  -hepatitis screen -labs monitor   #hcm Will review -vaccination -std screening Rpr cascade and triple screen -hepatitis Acute viral hep panel  -tb screening Will do near future -cancer screening To do anal pap next few visits F/u pcp for age appropriate cancer  screening             Follow-up: Return in about 6 weeks (around 11/02/2023).  Raymondo Band, MD [facility] 09/21/2023, 9:14 AM

## 2023-09-22 LAB — CYTOLOGY, (ORAL, ANAL, URETHRAL) ANCILLARY ONLY
Chlamydia: NEGATIVE
Chlamydia: NEGATIVE
Comment: NEGATIVE
Comment: NEGATIVE
Comment: NORMAL
Comment: NORMAL
Neisseria Gonorrhea: NEGATIVE
Neisseria Gonorrhea: POSITIVE — AB

## 2023-09-22 LAB — T-HELPER CELLS (CD4) COUNT (NOT AT ARMC)
CD4 % Helper T Cell: 6 % — ABNORMAL LOW (ref 33–65)
CD4 T Cell Abs: 147 /uL — ABNORMAL LOW (ref 400–1790)

## 2023-09-22 LAB — URINE CYTOLOGY ANCILLARY ONLY
Chlamydia: NEGATIVE
Comment: NEGATIVE
Comment: NEGATIVE
Comment: NORMAL
Neisseria Gonorrhea: NEGATIVE
Trichomonas: NEGATIVE

## 2023-09-24 ENCOUNTER — Emergency Department (HOSPITAL_COMMUNITY): Payer: MEDICAID

## 2023-09-24 ENCOUNTER — Emergency Department (HOSPITAL_COMMUNITY)
Admission: EM | Admit: 2023-09-24 | Discharge: 2023-09-25 | Disposition: A | Discharge status: Home / Self Care | Payer: MEDICAID | Attending: Emergency Medicine | Admitting: Emergency Medicine

## 2023-09-24 DIAGNOSIS — M79642 Pain in left hand: Secondary | ICD-10-CM | POA: Diagnosis present

## 2023-09-24 DIAGNOSIS — S0093XA Contusion of unspecified part of head, initial encounter: Secondary | ICD-10-CM | POA: Diagnosis not present

## 2023-09-24 DIAGNOSIS — S00511A Abrasion of lip, initial encounter: Secondary | ICD-10-CM | POA: Diagnosis not present

## 2023-09-24 DIAGNOSIS — Y92410 Unspecified street and highway as the place of occurrence of the external cause: Secondary | ICD-10-CM | POA: Diagnosis not present

## 2023-09-24 DIAGNOSIS — S62647A Nondisplaced fracture of proximal phalanx of left little finger, initial encounter for closed fracture: Secondary | ICD-10-CM | POA: Diagnosis not present
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
## 2023-09-25 ENCOUNTER — Telehealth: Payer: Self-pay

## 2023-09-25 ENCOUNTER — Emergency Department (HOSPITAL_COMMUNITY): Payer: MEDICAID

## 2023-09-25 LAB — CBC WITH DIFFERENTIAL/PLATELET
Abs Immature Granulocytes: 0.05 10*3/uL (ref 0.00–0.07)
Basophils Absolute: 0.1 10*3/uL (ref 0.0–0.1)
Basophils Relative: 1 %
Eosinophils Absolute: 0 10*3/uL (ref 0.0–0.5)
Eosinophils Relative: 0 %
HCT: 33.4 % — ABNORMAL LOW (ref 39.0–52.0)
Hemoglobin: 11.2 g/dL — ABNORMAL LOW (ref 13.0–17.0)
Immature Granulocytes: 1 %
Lymphocytes Relative: 35 %
Lymphs Abs: 3.2 10*3/uL (ref 0.7–4.0)
MCH: 30.2 pg (ref 26.0–34.0)
MCHC: 33.5 g/dL (ref 30.0–36.0)
MCV: 90 fL (ref 80.0–100.0)
Monocytes Absolute: 0.6 10*3/uL (ref 0.1–1.0)
Monocytes Relative: 7 %
Neutro Abs: 5.1 10*3/uL (ref 1.7–7.7)
Neutrophils Relative %: 56 %
Platelets: 233 10*3/uL (ref 150–400)
RBC: 3.71 MIL/uL — ABNORMAL LOW (ref 4.22–5.81)
RDW: 12.3 % (ref 11.5–15.5)
WBC: 9 10*3/uL (ref 4.0–10.5)
nRBC: 0 % (ref 0.0–0.2)

## 2023-09-25 LAB — COMPREHENSIVE METABOLIC PANEL
ALT: 23 U/L (ref 0–44)
AST: 25 U/L (ref 15–41)
Albumin: 3 g/dL — ABNORMAL LOW (ref 3.5–5.0)
Alkaline Phosphatase: 79 U/L (ref 38–126)
Anion gap: 9 (ref 5–15)
BUN: 9 mg/dL (ref 6–20)
CO2: 21 mmol/L — ABNORMAL LOW (ref 22–32)
Calcium: 8.6 mg/dL — ABNORMAL LOW (ref 8.9–10.3)
Chloride: 102 mmol/L (ref 98–111)
Creatinine, Ser: 0.81 mg/dL (ref 0.61–1.24)
GFR, Estimated: >60 mL/min (ref >60)
Glucose, Bld: 148 mg/dL — ABNORMAL HIGH (ref 70–99)
Potassium: 3.8 mmol/L (ref 3.5–5.1)
Sodium: 132 mmol/L — ABNORMAL LOW (ref 135–145)
Total Bilirubin: 0.5 mg/dL (ref 0.0–1.2)
Total Protein: 6.7 g/dL (ref 6.5–8.1)

## 2023-09-25 LAB — I-STAT CHEM 8, ED
BUN: 9 mg/dL (ref 6–20)
Calcium, Ion: 1.07 mmol/L — ABNORMAL LOW (ref 1.15–1.40)
Chloride: 102 mmol/L (ref 98–111)
Creatinine, Ser: 0.8 mg/dL (ref 0.61–1.24)
Glucose, Bld: 146 mg/dL — ABNORMAL HIGH (ref 70–99)
HCT: 32 % — ABNORMAL LOW (ref 39.0–52.0)
Hemoglobin: 10.9 g/dL — ABNORMAL LOW (ref 13.0–17.0)
Potassium: 3.8 mmol/L (ref 3.5–5.1)
Sodium: 135 mmol/L (ref 135–145)
TCO2: 24 mmol/L (ref 22–32)

## 2023-09-25 LAB — ETHANOL: Alcohol, Ethyl (B): <10 mg/dL (ref <10)

## 2023-09-25 MED ORDER — IOHEXOL 350 MG/ML SOLN
75.0000 mL | Freq: Once | INTRAVENOUS | Status: AC | PRN
  Administered 2023-09-25: 75 mL via INTRAVENOUS

## 2023-09-25 MED ORDER — MORPHINE SULFATE (PF) 4 MG/ML IV SOLN
4.0000 mg | Freq: Once | INTRAVENOUS | Status: AC
  Administered 2023-09-25: 4 mg via INTRAVENOUS
  Filled 2023-09-25: qty 1

## 2023-09-25 MED ORDER — ONDANSETRON HCL 4 MG/2ML IJ SOLN
4.0000 mg | Freq: Once | INTRAMUSCULAR | Status: AC
  Administered 2023-09-25: 4 mg via INTRAVENOUS
  Filled 2023-09-25: qty 2

## 2023-09-25 NOTE — Progress Notes (Signed)
Orthopedic Tech Progress Note Patient Details:  Edgar White November 23, 1965 [id number]  Ortho Devices Type of Ortho Device: Ulna gutter splint Ortho Device/Splint Location: lue Ortho Device/Splint Interventions: Ordered, Application, Adjustment   Post Interventions Patient Tolerated: Well Instructions Provided: Care of device, Adjustment of device  [name] 09/25/2023, 1:36 AM
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
## 2023-09-25 NOTE — Telephone Encounter (Signed)
I attempted to reach the patient to relay lab results.  No answer and LVM for patient to call back. Patient will also need treatment for gonorrhea. Edgar White, CMA

## 2023-09-25 NOTE — ED Notes (Signed)
Patient given bus - discharge plan to take bus to his friends house where he can find his phone. Patient agreeable with plan. Patient ambulatory with steady gait - gcs 15 - aox 4.

## 2023-09-25 NOTE — ED Notes (Signed)
Ortho tech at bedside placing ulnar gutter splint to LUE

## 2023-09-25 NOTE — Telephone Encounter (Signed)
-----   Message from Raymondo Band sent at 09/25/2023  8:29 AM EST ----- Hi team Please let him know his labs are c/w acute HIV infection  He should take his biktarvy every day We can revisit in 4 weeks or so  Thanks

## 2023-09-28 NOTE — Telephone Encounter (Signed)
 I spoke to patient's sister Dianna listed as his emergency contact advised he to let the patient know we are trying to contact him. Dianna will have the patient give us  a call. Tellis Spivak Adel Holt, CMA

## 2023-09-28 NOTE — Telephone Encounter (Signed)
 Second attempt to contact patient - left voicemail asking patient to return my call.   Jasun Gasparini Lesli Albee, CMA

## 2023-09-29 LAB — HIV-1 INTEGRASE GENOTYPE

## 2023-09-29 LAB — REFLEX TIQ

## 2023-09-29 LAB — CBC WITH DIFFERENTIAL/PLATELET
Absolute Lymphocytes: 3595 {cells}/uL (ref 850–3900)
Absolute Monocytes: 585 {cells}/uL (ref 200–950)
Basophils Absolute: 76 {cells}/uL (ref 0–200)
Basophils Relative: 1 %
Eosinophils Absolute: 8 {cells}/uL — ABNORMAL LOW (ref 15–500)
Eosinophils Relative: 0.1 %
HCT: 38.4 % — ABNORMAL LOW (ref 38.5–50.0)
Hemoglobin: 13.3 g/dL (ref 13.2–17.1)
MCH: 30.7 pg (ref 27.0–33.0)
MCHC: 34.6 g/dL (ref 32.0–36.0)
MCV: 88.7 fL (ref 80.0–100.0)
MPV: 9.8 fL (ref 7.5–12.5)
Monocytes Relative: 7.7 %
Neutro Abs: 3336 {cells}/uL (ref 1500–7800)
Neutrophils Relative %: 43.9 %
Platelets: 276 10*3/uL (ref 140–400)
RBC: 4.33 10*6/uL (ref 4.20–5.80)
RDW: 12 % (ref 11.0–15.0)
Total Lymphocyte: 47.3 %
WBC: 7.6 10*3/uL (ref 3.8–10.8)

## 2023-09-29 LAB — ACUTE HEP PANEL AND HEP B SURFACE AB
HEPATITIS C ANTIBODY REFILL$(REFL): NONREACTIVE
Hep A IgM: NONREACTIVE
Hep B C IgM: NONREACTIVE
Hepatitis B Surface Ag: NONREACTIVE

## 2023-09-29 LAB — COMPLETE METABOLIC PANEL WITH GFR
AG Ratio: 1.2 (calc) (ref 1.0–2.5)
ALT: 26 U/L (ref 9–46)
AST: 23 U/L (ref 10–35)
Albumin: 3.8 g/dL (ref 3.6–5.1)
Alkaline phosphatase (APISO): 93 U/L (ref 35–144)
BUN: 12 mg/dL (ref 7–25)
CO2: 29 mmol/L (ref 20–32)
Calcium: 10.4 mg/dL — ABNORMAL HIGH (ref 8.6–10.3)
Chloride: 100 mmol/L (ref 98–110)
Creat: 0.84 mg/dL (ref 0.70–1.30)
Globulin: 3.1 g/dL (ref 1.9–3.7)
Glucose, Bld: 178 mg/dL — ABNORMAL HIGH (ref 65–99)
Potassium: 4.9 mmol/L (ref 3.5–5.3)
Sodium: 137 mmol/L (ref 135–146)
Total Bilirubin: 0.3 mg/dL (ref 0.2–1.2)
Total Protein: 6.9 g/dL (ref 6.1–8.1)
eGFR: 102 mL/min/{1.73_m2} (ref >=60)

## 2023-09-29 LAB — SYPHILIS ANTIBODY CASCADING REFLEX: T. PALLIDUM AB: NEGATIVE

## 2023-09-29 LAB — GLUCOSE 6 PHOSPHATE DEHYDROGENASE: G-6PDH: 13.4 U/g{Hb} (ref 7.0–20.5)

## 2023-09-29 LAB — HIV-1 GENOTYPE: HIV-1 Genotype: DETECTED — AB

## 2023-09-29 LAB — HIV RNA, RTPCR W/R GT (RTI, PI,INT)
HIV 1 RNA Quant: 6310000 {copies}/mL — ABNORMAL HIGH
HIV-1 RNA Quant, Log: 6.8 {Log} — ABNORMAL HIGH

## 2023-09-30 DIAGNOSIS — Z7984 Long term (current) use of oral hypoglycemic drugs: Secondary | ICD-10-CM | POA: Insufficient documentation

## 2023-09-30 DIAGNOSIS — I10 Essential (primary) hypertension: Secondary | ICD-10-CM | POA: Insufficient documentation

## 2023-09-30 DIAGNOSIS — E1165 Type 2 diabetes mellitus with hyperglycemia: Secondary | ICD-10-CM | POA: Insufficient documentation

## 2023-09-30 DIAGNOSIS — R569 Unspecified convulsions: Secondary | ICD-10-CM | POA: Diagnosis present
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
## 2023-10-01 ENCOUNTER — Emergency Department (HOSPITAL_COMMUNITY)
Admission: EM | Admit: 2023-10-01 | Discharge: 2023-10-01 | Disposition: A | Discharge status: Home / Self Care | Payer: MEDICAID | Attending: Emergency Medicine | Admitting: Emergency Medicine

## 2023-10-01 ENCOUNTER — Encounter (HOSPITAL_COMMUNITY): Payer: Self-pay

## 2023-10-01 ENCOUNTER — Other Ambulatory Visit: Payer: Self-pay

## 2023-10-01 DIAGNOSIS — R569 Unspecified convulsions: Secondary | ICD-10-CM

## 2023-10-01 DIAGNOSIS — R739 Hyperglycemia, unspecified: Secondary | ICD-10-CM

## 2023-10-01 LAB — COMPREHENSIVE METABOLIC PANEL
ALT: 18 U/L (ref 0–44)
AST: 26 U/L (ref 15–41)
Albumin: 3.2 g/dL — ABNORMAL LOW (ref 3.5–5.0)
Alkaline Phosphatase: 87 U/L (ref 38–126)
Anion gap: 10 (ref 5–15)
BUN: 8 mg/dL (ref 6–20)
CO2: 22 mmol/L (ref 22–32)
Calcium: 8.9 mg/dL (ref 8.9–10.3)
Chloride: 102 mmol/L (ref 98–111)
Creatinine, Ser: 0.82 mg/dL (ref 0.61–1.24)
GFR, Estimated: >60 mL/min (ref >60)
Glucose, Bld: 284 mg/dL — ABNORMAL HIGH (ref 70–99)
Potassium: 3.7 mmol/L (ref 3.5–5.1)
Sodium: 134 mmol/L — ABNORMAL LOW (ref 135–145)
Total Bilirubin: 0.6 mg/dL (ref 0.0–1.2)
Total Protein: 7.1 g/dL (ref 6.5–8.1)

## 2023-10-01 LAB — CBC WITH DIFFERENTIAL/PLATELET
Abs Immature Granulocytes: 0.06 10*3/uL (ref 0.00–0.07)
Basophils Absolute: 0.1 10*3/uL (ref 0.0–0.1)
Basophils Relative: 1 %
Eosinophils Absolute: 0.3 10*3/uL (ref 0.0–0.5)
Eosinophils Relative: 4 %
HCT: 34.6 % — ABNORMAL LOW (ref 39.0–52.0)
Hemoglobin: 11.5 g/dL — ABNORMAL LOW (ref 13.0–17.0)
Immature Granulocytes: 1 %
Lymphocytes Relative: 44 %
Lymphs Abs: 3.7 10*3/uL (ref 0.7–4.0)
MCH: 30 pg (ref 26.0–34.0)
MCHC: 33.2 g/dL (ref 30.0–36.0)
MCV: 90.3 fL (ref 80.0–100.0)
Monocytes Absolute: 0.5 10*3/uL (ref 0.1–1.0)
Monocytes Relative: 6 %
Neutro Abs: 3.6 10*3/uL (ref 1.7–7.7)
Neutrophils Relative %: 44 %
Platelets: 260 10*3/uL (ref 150–400)
RBC: 3.83 MIL/uL — ABNORMAL LOW (ref 4.22–5.81)
RDW: 13.1 % (ref 11.5–15.5)
WBC: 8.3 10*3/uL (ref 4.0–10.5)
nRBC: 0 % (ref 0.0–0.2)

## 2023-10-01 LAB — MAGNESIUM: Magnesium: 1.9 mg/dL (ref 1.7–2.4)

## 2023-10-01 MED ORDER — GABAPENTIN 100 MG PO CAPS
100.0000 mg | ORAL_CAPSULE | Freq: Once | ORAL | Status: AC
  Administered 2023-10-01: 100 mg via ORAL
  Filled 2023-10-01: qty 1
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
## 2023-10-02 NOTE — Telephone Encounter (Signed)
 Thanks guys

## 2023-10-04 NOTE — Telephone Encounter (Signed)
Third attempt to reach [facility] via phone, no answer. Left HIPAA compliant voicemail requesting callback.   Sandie Ano, RN

## 2023-10-06 NOTE — Telephone Encounter (Signed)
Thanks Aundra Millet  He is in denial I believe  Hope he contacts is again

## 2023-10-06 NOTE — Telephone Encounter (Signed)
Fourth attempt to reach [facility], no answer. Left HIPAA compliant voicemail requesting callback.   Will try MyChart again.   Sandie Ano, RN

## 2023-10-07 ENCOUNTER — Emergency Department (HOSPITAL_COMMUNITY)
Admission: EM | Admit: 2023-10-07 | Discharge: 2023-10-08 | Disposition: A | Discharge status: Other Acute Inpatient Hospital | Payer: MEDICAID | Attending: Emergency Medicine | Admitting: Emergency Medicine

## 2023-10-07 ENCOUNTER — Other Ambulatory Visit: Payer: Self-pay

## 2023-10-07 ENCOUNTER — Encounter (HOSPITAL_COMMUNITY): Payer: Self-pay

## 2023-10-07 DIAGNOSIS — E119 Type 2 diabetes mellitus without complications: Secondary | ICD-10-CM | POA: Insufficient documentation

## 2023-10-07 DIAGNOSIS — Z8673 Personal history of transient ischemic attack (TIA), and cerebral infarction without residual deficits: Secondary | ICD-10-CM | POA: Diagnosis not present

## 2023-10-07 DIAGNOSIS — F333 Major depressive disorder, recurrent, severe with psychotic symptoms: Secondary | ICD-10-CM | POA: Insufficient documentation

## 2023-10-07 DIAGNOSIS — F419 Anxiety disorder, unspecified: Secondary | ICD-10-CM | POA: Diagnosis not present

## 2023-10-07 DIAGNOSIS — Z5900 Homelessness unspecified: Secondary | ICD-10-CM | POA: Diagnosis not present

## 2023-10-07 DIAGNOSIS — R45851 Suicidal ideations: Secondary | ICD-10-CM | POA: Insufficient documentation

## 2023-10-07 DIAGNOSIS — F1290 Cannabis use, unspecified, uncomplicated: Secondary | ICD-10-CM | POA: Insufficient documentation

## 2023-10-07 DIAGNOSIS — F17210 Nicotine dependence, cigarettes, uncomplicated: Secondary | ICD-10-CM | POA: Insufficient documentation

## 2023-10-07 DIAGNOSIS — Z85828 Personal history of other malignant neoplasm of skin: Secondary | ICD-10-CM | POA: Diagnosis not present

## 2023-10-07 DIAGNOSIS — F411 Generalized anxiety disorder: Secondary | ICD-10-CM | POA: Diagnosis present

## 2023-10-07 DIAGNOSIS — F323 Major depressive disorder, single episode, severe with psychotic features: Secondary | ICD-10-CM | POA: Diagnosis present

## 2023-10-07 DIAGNOSIS — F1220 Cannabis dependence, uncomplicated: Secondary | ICD-10-CM | POA: Diagnosis present

## 2023-10-07 DIAGNOSIS — Z7984 Long term (current) use of oral hypoglycemic drugs: Secondary | ICD-10-CM | POA: Diagnosis not present

## 2023-10-07 DIAGNOSIS — F17200 Nicotine dependence, unspecified, uncomplicated: Secondary | ICD-10-CM | POA: Diagnosis present

## 2023-10-07 LAB — COMPREHENSIVE METABOLIC PANEL
ALT: 17 U/L (ref 0–44)
AST: 27 U/L (ref 15–41)
Albumin: 3.3 g/dL — ABNORMAL LOW (ref 3.5–5.0)
Alkaline Phosphatase: 88 U/L (ref 38–126)
Anion gap: 8 (ref 5–15)
BUN: 8 mg/dL (ref 6–20)
CO2: 24 mmol/L (ref 22–32)
Calcium: 8.5 mg/dL — ABNORMAL LOW (ref 8.9–10.3)
Chloride: 100 mmol/L (ref 98–111)
Creatinine, Ser: 0.81 mg/dL (ref 0.61–1.24)
GFR, Estimated: >60 mL/min (ref >60)
Glucose, Bld: 128 mg/dL — ABNORMAL HIGH (ref 70–99)
Potassium: 3.9 mmol/L (ref 3.5–5.1)
Sodium: 132 mmol/L — ABNORMAL LOW (ref 135–145)
Total Bilirubin: 0.4 mg/dL (ref 0.0–1.2)
Total Protein: 7.7 g/dL (ref 6.5–8.1)

## 2023-10-07 LAB — CBC WITH DIFFERENTIAL/PLATELET
Abs Immature Granulocytes: 0.03 10*3/uL (ref 0.00–0.07)
Basophils Absolute: 0.1 10*3/uL (ref 0.0–0.1)
Basophils Relative: 1 %
Eosinophils Absolute: 0.6 10*3/uL — ABNORMAL HIGH (ref 0.0–0.5)
Eosinophils Relative: 6 %
HCT: 37 % — ABNORMAL LOW (ref 39.0–52.0)
Hemoglobin: 12.3 g/dL — ABNORMAL LOW (ref 13.0–17.0)
Immature Granulocytes: 0 %
Lymphocytes Relative: 54 %
Lymphs Abs: 5.2 10*3/uL — ABNORMAL HIGH (ref 0.7–4.0)
MCH: 29.9 pg (ref 26.0–34.0)
MCHC: 33.2 g/dL (ref 30.0–36.0)
MCV: 89.8 fL (ref 80.0–100.0)
Monocytes Absolute: 0.5 10*3/uL (ref 0.1–1.0)
Monocytes Relative: 5 %
Neutro Abs: 3.3 10*3/uL (ref 1.7–7.7)
Neutrophils Relative %: 34 %
Platelets: 240 10*3/uL (ref 150–400)
RBC: 4.12 MIL/uL — ABNORMAL LOW (ref 4.22–5.81)
RDW: 12.6 % (ref 11.5–15.5)
WBC: 9.7 10*3/uL (ref 4.0–10.5)
nRBC: 0 % (ref 0.0–0.2)

## 2023-10-07 LAB — RAPID URINE DRUG SCREEN, HOSP PERFORMED
Amphetamines: NOT DETECTED
Barbiturates: NOT DETECTED
Benzodiazepines: NOT DETECTED
Cocaine: NOT DETECTED
Opiates: NOT DETECTED
Tetrahydrocannabinol: POSITIVE — AB

## 2023-10-07 LAB — ETHANOL: Alcohol, Ethyl (B): <10 mg/dL (ref <10)

## 2023-10-07 MED ORDER — METFORMIN HCL ER 500 MG PO TB24
500.0000 mg | ORAL_TABLET | Freq: Two times a day (BID) | ORAL | Status: DC
  Administered 2023-10-07: 500 mg via ORAL
  Filled 2023-10-07 (×3): qty 1

## 2023-10-07 MED ORDER — GABAPENTIN 100 MG PO CAPS
100.0000 mg | ORAL_CAPSULE | Freq: Three times a day (TID) | ORAL | Status: DC
Start: 2023-10-07 — End: 2023-10-08
  Administered 2023-10-07 (×3): 100 mg via ORAL
  Filled 2023-10-07 (×3): qty 1

## 2023-10-07 MED ORDER — ARIPIPRAZOLE 5 MG PO TABS
5.0000 mg | ORAL_TABLET | Freq: Every day | ORAL | Status: DC
Start: 2023-10-07 — End: 2023-10-08
  Administered 2023-10-07: 5 mg via ORAL
  Filled 2023-10-07: qty 1

## 2023-10-07 MED ORDER — ACETAMINOPHEN 500 MG PO TABS: 500.0000 mg | ORAL_TABLET | Freq: Four times a day (QID) | ORAL | Status: DC | PRN

## 2023-10-07 MED ORDER — BICTEGRAVIR-EMTRICITAB-TENOFOV 50-200-25 MG PO TABS
1.0000 | ORAL_TABLET | Freq: Every day | ORAL | Status: DC
  Administered 2023-10-07: 1 via ORAL
  Filled 2023-10-07 (×2): qty 1

## 2023-10-07 MED ORDER — DIPHENHYDRAMINE HCL 25 MG PO CAPS
25.0000 mg | ORAL_CAPSULE | Freq: Once | ORAL | Status: AC
  Administered 2023-10-07: 25 mg via ORAL
  Filled 2023-10-07: qty 1

## 2023-10-07 MED ORDER — DIPHENHYDRAMINE HCL 25 MG PO CAPS
25.0000 mg | ORAL_CAPSULE | Freq: Four times a day (QID) | ORAL | Status: DC | PRN
  Administered 2023-10-07: 25 mg via ORAL
  Filled 2023-10-07: qty 1

## 2023-10-07 NOTE — ED Notes (Signed)
 2 pt belongings bag and a backpack with pt labels on it placed above nurses station cabinets.
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
## 2023-10-07 NOTE — ED Notes (Signed)
 Patient is sleep at this time will check vitals once he has woken up.
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
## 2023-10-07 NOTE — ED Notes (Signed)
 Patient has been calm and cooperative. He was compliant with medication and slept most of this shift so far.

## 2023-10-08 ENCOUNTER — Other Ambulatory Visit: Payer: Self-pay

## 2023-10-08 ENCOUNTER — Encounter: Payer: Self-pay | Admitting: Adult Health

## 2023-10-08 ENCOUNTER — Inpatient Hospital Stay
Admission: EM | Admit: 2023-10-08 | Discharge: 2023-10-14 | DRG: 885 | Disposition: A | Discharge status: Home / Self Care | Payer: MEDICAID | Source: Intra-hospital | Attending: Psychiatry | Admitting: Psychiatry

## 2023-10-08 DIAGNOSIS — S61411A Laceration without foreign body of right hand, initial encounter: Secondary | ICD-10-CM | POA: Diagnosis present

## 2023-10-08 DIAGNOSIS — Z981 Arthrodesis status: Secondary | ICD-10-CM

## 2023-10-08 DIAGNOSIS — F333 Major depressive disorder, recurrent, severe with psychotic symptoms: Principal | ICD-10-CM | POA: Diagnosis present

## 2023-10-08 DIAGNOSIS — R45851 Suicidal ideations: Secondary | ICD-10-CM | POA: Diagnosis present

## 2023-10-08 DIAGNOSIS — F1290 Cannabis use, unspecified, uncomplicated: Secondary | ICD-10-CM | POA: Diagnosis present

## 2023-10-08 DIAGNOSIS — F1510 Other stimulant abuse, uncomplicated: Secondary | ICD-10-CM | POA: Diagnosis present

## 2023-10-08 DIAGNOSIS — Z5941 Food insecurity: Secondary | ICD-10-CM

## 2023-10-08 DIAGNOSIS — S62617A Displaced fracture of proximal phalanx of left little finger, initial encounter for closed fracture: Secondary | ICD-10-CM | POA: Diagnosis not present

## 2023-10-08 DIAGNOSIS — Z7984 Long term (current) use of oral hypoglycemic drugs: Secondary | ICD-10-CM

## 2023-10-08 DIAGNOSIS — Z5982 Transportation insecurity: Secondary | ICD-10-CM | POA: Diagnosis not present

## 2023-10-08 DIAGNOSIS — Z79899 Other long term (current) drug therapy: Secondary | ICD-10-CM

## 2023-10-08 DIAGNOSIS — Z818 Family history of other mental and behavioral disorders: Secondary | ICD-10-CM

## 2023-10-08 DIAGNOSIS — Z5948 Other specified lack of adequate food: Secondary | ICD-10-CM

## 2023-10-08 DIAGNOSIS — F17210 Nicotine dependence, cigarettes, uncomplicated: Secondary | ICD-10-CM | POA: Diagnosis present

## 2023-10-08 DIAGNOSIS — E1140 Type 2 diabetes mellitus with diabetic neuropathy, unspecified: Secondary | ICD-10-CM | POA: Diagnosis present

## 2023-10-08 DIAGNOSIS — B20 Human immunodeficiency virus [HIV] disease: Secondary | ICD-10-CM | POA: Diagnosis present

## 2023-10-08 DIAGNOSIS — I10 Essential (primary) hypertension: Secondary | ICD-10-CM | POA: Diagnosis present

## 2023-10-08 DIAGNOSIS — M25511 Pain in right shoulder: Secondary | ICD-10-CM | POA: Diagnosis present

## 2023-10-08 DIAGNOSIS — F411 Generalized anxiety disorder: Secondary | ICD-10-CM | POA: Diagnosis present

## 2023-10-08 DIAGNOSIS — G4700 Insomnia, unspecified: Secondary | ICD-10-CM | POA: Diagnosis present

## 2023-10-08 DIAGNOSIS — Z808 Family history of malignant neoplasm of other organs or systems: Secondary | ICD-10-CM | POA: Diagnosis not present

## 2023-10-08 DIAGNOSIS — Z716 Tobacco abuse counseling: Secondary | ICD-10-CM | POA: Diagnosis not present

## 2023-10-08 DIAGNOSIS — F1910 Other psychoactive substance abuse, uncomplicated: Secondary | ICD-10-CM | POA: Diagnosis present

## 2023-10-08 DIAGNOSIS — Z5900 Homelessness unspecified: Secondary | ICD-10-CM

## 2023-10-08 DIAGNOSIS — S61412A Laceration without foreign body of left hand, initial encounter: Secondary | ICD-10-CM | POA: Diagnosis present

## 2023-10-08 DIAGNOSIS — Z85828 Personal history of other malignant neoplasm of skin: Secondary | ICD-10-CM

## 2023-10-08 DIAGNOSIS — R21 Rash and other nonspecific skin eruption: Secondary | ICD-10-CM | POA: Diagnosis present

## 2023-10-08 DIAGNOSIS — E119 Type 2 diabetes mellitus without complications: Secondary | ICD-10-CM

## 2023-10-08 DIAGNOSIS — F332 Major depressive disorder, recurrent severe without psychotic features: Secondary | ICD-10-CM | POA: Diagnosis present

## 2023-10-08 DIAGNOSIS — L299 Pruritus, unspecified: Secondary | ICD-10-CM | POA: Diagnosis present

## 2023-10-08 DIAGNOSIS — Z91148 Patient's other noncompliance with medication regimen for other reason: Secondary | ICD-10-CM

## 2023-10-08 DIAGNOSIS — R4587 Impulsiveness: Secondary | ICD-10-CM | POA: Diagnosis present

## 2023-10-08 LAB — VALPROIC ACID LEVEL: Valproic Acid Lvl: 39 ug/mL — ABNORMAL LOW (ref 50.0–100.0)

## 2023-10-08 MED ORDER — DIVALPROEX SODIUM 500 MG PO DR TAB
500.0000 mg | DELAYED_RELEASE_TABLET | Freq: Two times a day (BID) | ORAL | Status: DC
  Administered 2023-10-09 – 2023-10-14 (×11): 500 mg via ORAL
  Filled 2023-10-08 (×12): qty 1

## 2023-10-08 MED ORDER — BICTEGRAVIR-EMTRICITAB-TENOFOV 50-200-25 MG PO TABS
1.0000 | ORAL_TABLET | Freq: Every day | ORAL | Status: DC
  Administered 2023-10-08 – 2023-10-14 (×7): 1 via ORAL
  Filled 2023-10-08 (×7): qty 1

## 2023-10-08 MED ORDER — METFORMIN HCL ER 500 MG PO TB24
500.0000 mg | ORAL_TABLET | Freq: Two times a day (BID) | ORAL | Status: DC
  Administered 2023-10-08 – 2023-10-14 (×12): 500 mg via ORAL
  Filled 2023-10-08 (×13): qty 1

## 2023-10-08 MED ORDER — AZELASTINE HCL 0.1 % NA SOLN
1.0000 | Freq: Two times a day (BID) | NASAL | Status: DC
  Filled 2023-10-08: qty 30

## 2023-10-08 MED ORDER — ALUM & MAG HYDROXIDE-SIMETH 200-200-20 MG/5ML PO SUSP: 30.0000 mL | ORAL | Status: DC | PRN

## 2023-10-08 MED ORDER — DIVALPROEX SODIUM 500 MG PO DR TAB
500.0000 mg | DELAYED_RELEASE_TABLET | Freq: Two times a day (BID) | ORAL | Status: DC
  Administered 2023-10-08: 500 mg via ORAL
  Filled 2023-10-08: qty 1

## 2023-10-08 MED ORDER — DIPHENHYDRAMINE HCL 25 MG PO CAPS
25.0000 mg | ORAL_CAPSULE | Freq: Four times a day (QID) | ORAL | Status: DC | PRN
  Administered 2023-10-08 – 2023-10-14 (×15): 25 mg via ORAL
  Filled 2023-10-08 (×15): qty 1

## 2023-10-08 MED ORDER — GABAPENTIN 100 MG PO CAPS
100.0000 mg | ORAL_CAPSULE | Freq: Three times a day (TID) | ORAL | Status: DC
  Administered 2023-10-08 – 2023-10-10 (×7): 100 mg via ORAL
  Filled 2023-10-08 (×7): qty 1

## 2023-10-08 MED ORDER — HALOPERIDOL 5 MG PO TABS: 5.0000 mg | ORAL_TABLET | Freq: Three times a day (TID) | ORAL | Status: DC | PRN

## 2023-10-08 MED ORDER — AZELASTINE HCL 0.1 % NA SOLN
1.0000 | Freq: Two times a day (BID) | NASAL | Status: DC
  Administered 2023-10-09 – 2023-10-14 (×11): 1 via NASAL
  Filled 2023-10-08: qty 30

## 2023-10-08 MED ORDER — DIPHENHYDRAMINE HCL 25 MG PO CAPS
50.0000 mg | ORAL_CAPSULE | Freq: Three times a day (TID) | ORAL | Status: DC | PRN
  Filled 2023-10-08: qty 2

## 2023-10-08 MED ORDER — ARIPIPRAZOLE 5 MG PO TABS
5.0000 mg | ORAL_TABLET | Freq: Every day | ORAL | Status: DC
  Administered 2023-10-08 – 2023-10-14 (×7): 5 mg via ORAL
  Filled 2023-10-08 (×7): qty 1

## 2023-10-08 MED ORDER — MAGNESIUM HYDROXIDE 400 MG/5ML PO SUSP: 30.0000 mL | Freq: Every day | ORAL | Status: DC | PRN

## 2023-10-08 MED ORDER — ENSURE ENLIVE PO LIQD
237.0000 mL | Freq: Two times a day (BID) | ORAL | Status: DC
  Administered 2023-10-08 – 2023-10-14 (×10): 237 mL via ORAL

## 2023-10-08 MED ORDER — ACETAMINOPHEN 500 MG PO TABS
500.0000 mg | ORAL_TABLET | Freq: Four times a day (QID) | ORAL | Status: DC | PRN
Start: 2023-10-08 — End: 2023-10-14

## 2023-10-08 NOTE — Plan of Care (Signed)
   Problem: Education: Goal: Emotional status will improve Outcome: Progressing Goal: Mental status will improve Outcome: Progressing Goal: Verbalization of understanding the information provided will improve Outcome: Progressing

## 2023-10-08 NOTE — ED Notes (Signed)
 Pt resting at this time even and unlabored respirations noted.

## 2023-10-08 NOTE — Progress Notes (Signed)
   10/08/23 1500  Spiritual Encounters  Type of Visit Initial  Care provided to: Patient  Referral source Patient request  Reason for visit Routine spiritual support  OnCall Visit Yes  Spiritual Framework  Presenting Themes Coping tools;Courage hope and growth  Patient Stress Factors Family relationships;Health changes  Interventions  Spiritual Care Interventions Made Narrative/life review   Chaplain responded to patient request for prayer. Patient expressed through tears his relational challenges with family, how his mom and dad are calling for him from the next life, and his plan to die by suicide. Patient also talked about the pain in his hands and the constant discomfort he feels. Chaplain talked about patient's support which it this moment is just one individual who he has Bible studies with and they attend church together. He confessed that he does not have much support. Chaplain explored Biblical support for patient's challenges and prayed together.

## 2023-10-08 NOTE — Tx Team (Signed)
 Initial Treatment Plan 10/08/2023 11:16 AM [name] JME:[id number]    PATIENT STRESSORS: Financial difficulties   Marital or family conflict   Medication change or noncompliance   Occupational concerns     PATIENT STRENGTHS: Average or above average intelligence  General fund of knowledge    PATIENT IDENTIFIED PROBLEMS: "Getting rid of this pain."  "Developing coping skills"                   DISCHARGE CRITERIA:  Ability to meet basic life and health needs Adequate post-discharge living arrangements Improved stabilization in mood, thinking, and/or behavior  PRELIMINARY DISCHARGE PLAN: Attend aftercare/continuing care group Outpatient therapy Placement in alternative living arrangements  PATIENT/FAMILY INVOLVEMENT: This treatment plan has been presented to and reviewed with the patient, Edgar White.  The patient has been given the opportunity to ask questions and make suggestions.  Gardiner Barefoot, RN 10/08/2023, 11:16 AM
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
## 2023-10-08 NOTE — BHH Counselor (Signed)
 The LCSWA attempted to complete the PSA with the patient but he refused to answer the questions. Stating that they were making him angry and that he had just arrived. The LCSWA informed the patient that she would try again.    Patric Dykes, MSW, [employer]

## 2023-10-08 NOTE — Group Note (Signed)
 Date:  10/08/2023 Time:  11:14 PM  Group Topic/Focus:  Wrap-Up Group:   The focus of this group is to help patients review their daily goal of treatment and discuss progress on daily workbooks.    Participation Level:  Did Not Attend  Participation Quality:   none  Affect:   none  Cognitive:   none  Insight: None  Engagement in Group:   none  Modes of Intervention:   none  Additional Comments:  none   Belva Crome 10/08/2023, 11:14 PM

## 2023-10-09 MED ORDER — LORAZEPAM 2 MG/ML IJ SOLN
2.0000 mg | Freq: Once | INTRAMUSCULAR | Status: AC
  Administered 2023-10-09: 2 mg via INTRAMUSCULAR
  Filled 2023-10-09: qty 1

## 2023-10-09 MED ORDER — CEFTRIAXONE SODIUM 500 MG IJ SOLR
500.0000 mg | Freq: Once | INTRAMUSCULAR | Status: AC
  Administered 2023-10-09: 500 mg via INTRAMUSCULAR
  Filled 2023-10-09: qty 500

## 2023-10-09 NOTE — Plan of Care (Signed)
 Alert and oriented, RA, tearful, verbalizes SI, verbally contracts to safety. Q15 min safety checks performed/ongoing. Care, comfort, and safety maintained/ongoing.   Problem: Education: Goal: Knowledge of [facility] Education information/materials will improve Outcome: Not Progressing Goal: Emotional status will improve Outcome: Not Progressing Goal: Mental status will improve Outcome: Not Progressing Goal: Verbalization of understanding the information provided will improve Outcome: Not Progressing   Problem: Activity: Goal: Interest or engagement in activities will improve Outcome: Not Progressing Goal: Sleeping patterns will improve Outcome: Not Progressing   Problem: Coping: Goal: Ability to verbalize frustrations and anger appropriately will improve Outcome: Not Progressing Goal: Ability to demonstrate self-control will improve Outcome: Not Progressing   Problem: Health Behavior/Discharge Planning: Goal: Identification of resources available to assist in meeting health care needs will improve Outcome: Not Progressing Goal: Compliance with treatment plan for underlying cause of condition will improve Outcome: Not Progressing   Problem: Physical Regulation: Goal: Ability to maintain clinical measurements within normal limits will improve Outcome: Not Progressing   Problem: Safety: Goal: Periods of time without injury will increase Outcome: Not Progressing

## 2023-10-09 NOTE — Plan of Care (Signed)
   Problem: Education: Goal: Emotional status will improve Outcome: Progressing Goal: Mental status will improve Outcome: Progressing Goal: Verbalization of understanding the information provided will improve Outcome: Progressing

## 2023-10-09 NOTE — Group Note (Signed)
 Date:  10/09/2023 Time:  10:51 PM  Group Topic/Focus:  Self Care:   The focus of this group is to help patients understand the importance of self-care in order to improve or restore emotional, physical, spiritual, interpersonal, and financial health. Wellness Toolbox:   The focus of this group is to discuss various aspects of wellness, balancing those aspects and exploring ways to increase the ability to experience wellness.  Patients will create a wellness toolbox for use upon discharge. Wrap-Up Group:   The focus of this group is to help patients review their daily goal of treatment and discuss progress on daily workbooks.    Participation Level:  Did Not Attend  10/09/2023, 10:51 PM

## 2023-10-09 NOTE — Progress Notes (Signed)
   10/09/23 0829  Psych Admission Type (Psych Patients Only)  Admission Status Voluntary  Psychosocial Assessment  Patient Complaints Anxiety;Depression  Eye Contact Brief  Facial Expression Sad  Affect Depressed;Sad  Speech Logical/coherent  Interaction Attention-seeking  Motor Activity Fidgety  Appearance/Hygiene In scrubs  Behavior Characteristics Fidgety  Mood Depressed;Anxious  Thought Process  Coherency WDL  Content Blaming others  Delusions None reported or observed  Perception Hallucinations  Hallucination Auditory  Judgment Poor  Confusion None  Danger to Self  Current suicidal ideation? Passive  Self-Injurious Behavior No self-injurious ideation or behavior indicators observed or expressed   Agreement Not to Harm Self Yes  Description of Agreement Verbal  Danger to Others  Danger to Others None reported or observed

## 2023-10-09 NOTE — Progress Notes (Signed)
   10/08/23 1945  Psych Admission Type (Psych Patients Only)  Admission Status Voluntary  Psychosocial Assessment  Patient Complaints Depression;Helplessness;Sadness  Eye Contact Brief  Facial Expression Sad  Affect Depressed;Sad  Speech Logical/coherent  Interaction Attention-seeking  Motor Activity Fidgety  Appearance/Hygiene In scrubs  Behavior Characteristics Fidgety  Mood Depressed  Aggressive Behavior  Effect No apparent injury  Thought Process  Coherency WDL  Content Blaming others  Delusions None reported or observed  Perception Hallucinations  Hallucination Auditory  Judgment Poor  Confusion None  Danger to Self  Current suicidal ideation? Verbalizes  Self-Injurious Behavior No self-injurious ideation or behavior indicators observed or expressed   Agreement Not to Harm Self Yes  Description of Agreement Verbal  Danger to Others  Danger to Others None reported or observed
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
## 2023-10-09 NOTE — BH IP Treatment Plan (Signed)
 Interdisciplinary Treatment and Diagnostic Plan Update  10/09/2023 Time of Session: 13:33 Naif Alabi MRN: [id number]  Principal Diagnosis: MDD (major depressive disorder), recurrent, severe, with psychosis (HCC)  Secondary Diagnoses: Principal Problem:   MDD (major depressive disorder), recurrent, severe, with psychosis (HCC)   Current Medications:  Current Facility-Administered Medications  Medication Dose Route Frequency Provider Last Rate Last Admin   acetaminophen (TYLENOL) tablet 500 mg  500 mg Oral Q6H PRN Tingling, Stephanie, PA-C       alum & mag hydroxide-simeth (MAALOX/MYLANTA) 200-200-20 MG/5ML suspension 30 mL  30 mL Oral Q4H PRN Tingling, Stephanie, PA-C       ARIPiprazole (ABILIFY) tablet 5 mg  5 mg Oral Daily Tingling, Stephanie, PA-C   5 mg at 10/09/23 0829   azelastine (ASTELIN) 0.1 % nasal spray 1 spray  1 spray Each Nare BID Tingling, Stephanie, PA-C   1 spray at 10/09/23 0829   bictegravir-emtricitabine-tenofovir AF (BIKTARVY) 50-200-25 MG per tablet 1 tablet  1 tablet Oral Daily Tingling, Stephanie, PA-C   1 tablet at 10/09/23 6440   diphenhydrAMINE (BENADRYL) capsule 25 mg  25 mg Oral Q6H PRN Tingling, Stephanie, PA-C   25 mg at 10/09/23 3474   haloperidol (HALDOL) tablet 5 mg  5 mg Oral TID PRN Tingling, Stephanie, PA-C       And   diphenhydrAMINE (BENADRYL) capsule 50 mg  50 mg Oral TID PRN Tingling, Stephanie, PA-C       divalproex (DEPAKOTE) DR tablet 500 mg  500 mg Oral Q12H Tingling, Stephanie, PA-C   500 mg at 10/09/23 2595   feeding supplement (ENSURE ENLIVE / ENSURE PLUS) liquid 237 mL  237 mL Oral BID BM Tingling, Stephanie, PA-C   237 mL at 10/09/23 1000   gabapentin (NEURONTIN) capsule 100 mg  100 mg Oral TID Tingling, Stephanie, PA-C   100 mg at 10/09/23 1220   magnesium hydroxide (MILK OF MAGNESIA) suspension 30 mL  30 mL Oral Daily PRN Tingling, Stephanie, PA-C       metFORMIN (GLUCOPHAGE-XR) 24 hr tablet 500 mg  500 mg Oral BID WC Tingling,  Stephanie, PA-C   500 mg at 10/09/23 6387   PTA Medications: Medications Prior to Admission  Medication Sig Dispense Refill Last Dose/Taking   acetaminophen (TYLENOL) 500 MG tablet Take 500 mg by mouth every 6 (six) hours as needed.      atorvastatin (LIPITOR) 20 MG tablet Take 1 tablet (20 mg total) by mouth daily. (Patient not taking: Reported on 06/27/2023) 30 tablet 2    azelastine (ASTELIN) 0.1 % nasal spray Place 1 spray into both nostrils 2 (two) times daily.      bictegravir-emtricitabine-tenofovir AF (BIKTARVY) 50-200-25 MG TABS tablet Take 1 tablet by mouth daily. 30 tablet 3    brompheniramine-pseudoephedrine-DM 30-2-10 MG/5ML syrup Take 5 mLs by mouth 4 (four) times daily as needed.      clonazePAM (KLONOPIN) 0.5 MG tablet Take 1 tablet (0.5 mg total) by mouth 2 (two) times daily. (Patient not taking: Reported on 09/21/2023) 60 tablet 0    divalproex (DEPAKOTE) 500 MG DR tablet Take 1 tablet (500 mg total) by mouth every 12 (twelve) hours. (Patient not taking: Reported on 09/21/2023) 60 tablet 0    gabapentin (NEURONTIN) 100 MG capsule Take 1 capsule (100 mg total) by mouth 3 (three) times daily. 90 capsule 2    irbesartan (AVAPRO) 75 MG tablet Take 1 tablet (75 mg total) by mouth daily. (Patient not taking: Reported on 06/27/2023) 30 tablet 2  metFORMIN (GLUCOPHAGE-XR) 500 MG 24 hr tablet Take 1 tablet (500 mg total) by mouth daily. 30 tablet 2    paliperidone (INVEGA) 3 MG 24 hr tablet Take 1 tablet (3 mg total) by mouth daily. (Patient not taking: Reported on 06/27/2023) 30 tablet 0     Patient Stressors: Financial difficulties   Marital or family conflict   Medication change or noncompliance   Occupational concerns    Patient Strengths: Average or above average intelligence  General fund of knowledge   Treatment Modalities: Medication Management, Group therapy, Case management,  1 to 1 session with clinician, Psychoeducation, Recreational therapy.   Physician Treatment Plan  for Primary Diagnosis: MDD (major depressive disorder), recurrent, severe, with psychosis (HCC) Long Term Goal(s):     Short Term Goals:    Medication Management: Evaluate patient's response, side effects, and tolerance of medication regimen.  Therapeutic Interventions: 1 to 1 sessions, Unit Group sessions and Medication administration.  Evaluation of Outcomes: Not Met  Physician Treatment Plan for Secondary Diagnosis: Principal Problem:   MDD (major depressive disorder), recurrent, severe, with psychosis (HCC)  Long Term Goal(s):     Short Term Goals:       Medication Management: Evaluate patient's response, side effects, and tolerance of medication regimen.  Therapeutic Interventions: 1 to 1 sessions, Unit Group sessions and Medication administration.  Evaluation of Outcomes: Not Met   RN Treatment Plan for Primary Diagnosis: MDD (major depressive disorder), recurrent, severe, with psychosis (HCC) Long Term Goal(s): Knowledge of disease and therapeutic regimen to maintain health will improve  Short Term Goals: Ability to remain free from injury will improve, Ability to verbalize frustration and anger appropriately will improve, Ability to demonstrate self-control, Ability to participate in decision making will improve, Ability to verbalize feelings will improve, Ability to disclose and discuss suicidal ideas, Ability to identify and develop effective coping behaviors will improve, and Compliance with prescribed medications will improve  Medication Management: RN will administer medications as ordered by provider, will assess and evaluate patient's response and provide education to patient for prescribed medication. RN will report any adverse and/or side effects to prescribing provider.  Therapeutic Interventions: 1 on 1 counseling sessions, Psychoeducation, Medication administration, Evaluate responses to treatment, Monitor vital signs and CBGs as ordered, Perform/monitor CIWA,  COWS, AIMS and Fall Risk screenings as ordered, Perform wound care treatments as ordered.  Evaluation of Outcomes: Not Met   LCSW Treatment Plan for Primary Diagnosis: MDD (major depressive disorder), recurrent, severe, with psychosis (HCC) Long Term Goal(s): Safe transition to appropriate next level of care at discharge, Engage patient in therapeutic group addressing interpersonal concerns.  Short Term Goals: Engage patient in aftercare planning with referrals and resources, Increase social support, Increase ability to appropriately verbalize feelings, Increase emotional regulation, Facilitate acceptance of mental health diagnosis and concerns, Facilitate patient progression through stages of change regarding substance use diagnoses and concerns, Identify triggers associated with mental health/substance abuse issues, and Increase skills for wellness and recovery  Therapeutic Interventions: Assess for all discharge needs, 1 to 1 time with Social worker, Explore available resources and support systems, Assess for adequacy in community support network, Educate family and significant other(s) on suicide prevention, Complete Psychosocial Assessment, Interpersonal group therapy.  Evaluation of Outcomes: Not Met   Progress in Treatment: Attending groups: No. Participating in groups: No. Taking medication as prescribed: Yes. Toleration medication: Yes. Family/Significant other contact made: No, will contact:  when given permission. Patient understands diagnosis: Yes. Discussing patient identified problems/goals with staff: Yes.  Medical problems stabilized or resolved: Yes. Denies suicidal/homicidal ideation: Yes. Issues/concerns per patient self-inventory: No. Other: none.  New problem(s) identified: No, Describe:  none identified.  New Short Term/Long Term Goal(s): elimination of symptoms of psychosis, medication management for mood stabilization; elimination of SI thoughts; development of  comprehensive mental wellness/sobriety plan.  Patient Goals:  Patient declined to participate in the treatment team meeting despite personal invitation from staff.   Discharge Plan or Barriers: CSW will assist pt with development of an appropriate aftercare/discharge plan.   Reason for Continuation of Hospitalization: Anxiety Depression Hallucinations Medication stabilization Suicidal ideation  Estimated Length of Stay: 1-7 days  Last 3 [place] Suicide Severity Risk Score: Flowsheet Row Admission (Current) from 10/08/2023 in [facility] INPATIENT BEH[facility]AL MEDICINE ED from 10/07/2023 in [facility] Emergency Department at [facility] ED from 10/01/2023 in [facility] Emergency Department at [facility]  C-SSRS RISK CATEGORY High Risk High Risk No Risk       Last [facility] 2/9 Scores:    10/07/2023   10:06 AM  Depression screen PHQ 2/9  Decreased Interest 3  Down, Depressed, Hopeless 3  PHQ - 2 Score 6  Altered sleeping 2  Tired, decreased energy 2  Change in appetite 3  Feeling bad or failure about yourself  3  Trouble concentrating 3  Moving slowly or fidgety/restless 0  Suicidal thoughts 3  PHQ-9 Score 22  Difficult doing work/chores Extremely dIfficult    Scribe for Treatment Team: Glenis Smoker, LCSW 10/09/2023 2:18 PM

## 2023-10-09 NOTE — Group Note (Signed)
 Recreation Therapy Group Note   Group Topic:Coping Skills  Group Date: 10/09/2023 Start Time: 1000 End Time: 1045 Facilitators: Rosina Lowenstein, LRT, CTRS Location:  Craft Room  Group Description: Mind Map.  Patient was provided a blank template of a diagram with 32 blank boxes in a tiered system, branching from the center (similar to a bubble chart). LRT directed patients to label the middle of the diagram "Coping Skills". LRT and patients then came up with 8 different coping skills as examples. Pt were directed to record their coping skills in the 2nd tier boxes closest to the center.  Patients would then share their coping skills with the group as LRT wrote them out. LRT gave a handout of 99 different coping skills at the end of group.   Goal Area(s) Addressed: Patients will be able to define "coping skills". Patient will identify new coping skills.  Patient will increase communication.   Affect/Mood: Appropriate   Participation Level: Minimal    Clinical Observations/Individualized Feedback: Patient left in the middle of group. Pt did not interact with LRT or peers while present. Pt left and did not return.   Plan: Continue to engage patient in RT group sessions 2-3x/week.   Rosina Lowenstein, LRT, CTRS 10/09/2023 1:45 PM

## 2023-10-09 NOTE — Progress Notes (Signed)
   10/09/23 0600  15 Minute Checks  Location Bedroom  Visual Appearance Calm  Behavior Sleeping  Sleep (Behavioral Health Patients Only)  Calculate sleep? (Click Yes once per 24 hr at 0600 safety check) Yes  Documented sleep last 24 hours 12.75

## 2023-10-09 NOTE — Progress Notes (Signed)
 NUTRITION ASSESSMENT  Pt identified as at risk on the Malnutrition Screen Tool  INTERVENTION:  -MVI with minerals daily -Continue Ensure Enlive po BID, each supplement provides 350 kcal and 20 grams of protein -Continue regular diet  NUTRITION DIAGNOSIS: Unintentional weight loss related to sub-optimal intake as evidenced by pt report.   Goal: Pt to meet >/= 90% of their estimated nutrition needs.  Monitor:  PO intake  Assessment:  Pt admitted with for suicidal ideation with plan. He carries the psychiatric diagnoses of MDD, substance abuse, anxiety, cannabis use disorder and has a past medical history of Type 2 DM; CVA, Ataxis, Abnormal movement.   Noted pt s/p removal of multiple upper and lower molars to both sides of his mouth in 2018-2019. Per prior RD notes, pt is very self-conscious and does not like to have his food cut up for him.   Pt currently on a regular diet. No meal completion data available to assess at this time.   Reviewed wt hx; pt has experienced a 9.3% wt loss over the past month, which is significant for time frame. Suspect some wt loss may be related to uncontrolled DM. Pt also homeless, suspect social circumstances may be contributing to weight loss.   Given wt loss, RD will continue with regular diet for widest variety of meal selections.   Medications reviewed and include biktarvy, depakote, and neurontin.   Lab Results  Component Value Date   HGBA1C 8.8 (H) 06/14/2023   PTA DM medications are 500 mg metformin daily.   Labs reviewed: Na : 132, CBGS: 121 (inpatient orders for glycemic control are 500 mg metformin BID). Tox screen positive for THC and amphetamines.  58 y.o. male  Height: Ht Readings from Last 1 Encounters:  10/08/23 5\' 10"  (1.778 m)    Weight: Wt Readings from Last 1 Encounters:  10/08/23 65.3 kg    Weight Hx: Wt Readings from Last 10 Encounters:  10/08/23 65.3 kg  10/01/23 72.6 kg  09/21/23 69.4 kg  09/07/23 72 kg   07/30/23 72.1 kg  06/27/23 72.6 kg  06/12/23 72.6 kg  06/11/23 72.6 kg  04/09/23 68 kg  04/05/23 69.4 kg    BMI:  Body mass index is 20.66 kg/m. BMI WDL.   Estimated Nutritional Needs: Kcal: 25-30 kcal/kg Protein: > 1 gram protein/kg Fluid: 1 ml/kcal  Diet Order:  Diet Order             Diet regular Room service appropriate? No; Fluid consistency: Thin  Diet effective now                  Pt is also offered choice of unit snacks mid-morning and mid-afternoon.  Pt is eating as desired.   Lab results and medications reviewed.   Levada Schilling, RD, LDN, CDCES Registered Dietitian III Certified Diabetes Care and Education Specialist If unable to reach this RD, please use "RD Inpatient" group chat on secure chat between hours of 8am-4 pm daily

## 2023-10-09 NOTE — BHH Suicide Risk Assessment (Signed)
 [facility] Admission Suicide Risk Assessment   Nursing information obtained from:    Demographic factors:  Male, Caucasian, Low socioeconomic status, Unemployed Current Mental Status:  Suicidal ideation indicated by patient, Suicide plan, Plan includes specific time, place, or method Loss Factors:  Decline in physical health, Financial problems / change in socioeconomic status Historical Factors:  Prior suicide attempts Risk Reduction Factors:  Positive social support  Total Time spent with patient: 15 minutes Principal Problem: MDD (major depressive disorder), recurrent, severe, with psychosis (HCC) Diagnosis:  Principal Problem:   MDD (major depressive disorder), recurrent, severe, with psychosis (HCC)  Subjective Data: Patient is homeless and struggling with significant challenges with cannabis abuse and emotional challenges related to dealing with homelessness.  Continued Clinical Symptoms:  Alcohol Use Disorder Identification Test Final Score (AUDIT): 0 The "Alcohol Use Disorders Identification Test", Guidelines for Use in Primary Care, Second Edition.  World Science writer [facility]). Score between 0-7:  no or low risk or alcohol related problems. Score between 8-15:  moderate risk of alcohol related problems. Score between 16-19:  high risk of alcohol related problems. Score 20 or above:  warrants further diagnostic evaluation for alcohol dependence and treatment.   CLINICAL FACTORS:   Depression:   Anhedonia Comorbid alcohol abuse/dependence Hopelessness   Musculoskeletal: Strength & Muscle Tone: within normal limits Gait & Station: normal Patient leans: N/A  Psychiatric Specialty Exam:  Presentation  General Appearance:  Disheveled  Eye Contact: Minimal  Speech: Other (comment); Slow; Blocked (minimal)  Speech Volume: Decreased  Handedness: Right   Mood and Affect  Mood: -- ("ok")  Affect: Labile; Tearful   Thought Process  Thought  Processes: Disorganized  Descriptions of Associations:Circumstantial  Orientation:Full (Time, Place and Person)  Thought Content:Paranoid Ideation  History of Schizophrenia/Schizoaffective disorder:No  Duration of Psychotic Symptoms:Less than six months  Hallucinations:No data recorded Ideas of Reference:Paranoia  Suicidal Thoughts:No data recorded Homicidal Thoughts:No data recorded  Sensorium  Memory: Immediate Poor; Recent Poor; Remote Poor  Judgment: Poor  Insight: Poor   Executive Functions  Concentration: Poor  Attention Span: Poor  Recall: Poor  Fund of Knowledge: Poor  Language: Poor   Psychomotor Activity  Psychomotor Activity:No data recorded  Assets  Assets: Resilience   Sleep  Sleep:No data recorded   Physical Exam: Physical Exam ROS Blood pressure 115/73, pulse 98, temperature (!) 97.2 F (36.2 C), resp. rate 18, height 5\' 10"  (1.778 m), weight 65.3 kg, SpO2 100%. Body mass index is 20.66 kg/m.   COGNITIVE FEATURES THAT CONTRIBUTE TO RISK:  Closed-mindedness    SUICIDE RISK:   Moderate:  Frequent suicidal ideation with limited intensity, and duration, some specificity in terms of plans, no associated intent, good self-control, limited dysphoria/symptomatology, some risk factors present, and identifiable protective factors, including available and accessible social support.  PLAN OF CARE: Continue with current treatment plan and will admit him to inpatient psychiatry  I certify that inpatient services furnished can reasonably be expected to improve the patient's condition.   Timmie Foerster, MD 10/09/2023, 4:24 PM

## 2023-10-09 NOTE — Progress Notes (Signed)
 Patient approached this RN and MHT in hallway and stated that he felt like he was going to have a seizure. Patient escorted to his room and helped into bed. Vitals assessed. 2mg  IM ativan ordered by outside provider and administered without incident. No seizure activity noted. Patient resting comfortably in his bed. Safety checks continue. Patient remains safe at this time.

## 2023-10-09 NOTE — Progress Notes (Signed)
 ID PT has anal/rectal GC positive on PCR screening done on 09/21/23 H/o HIV on Bitarvy Will need Ceftriaxone 500mg  Im one dose Chlamydia neg F/u with Dr.Vu as OP

## 2023-10-09 NOTE — Group Note (Signed)
 Date:  10/09/2023 Time:  10:21 AM  Group Topic/Focus:  Diagnosis Education:   The focus of this group is to discuss the major disorders that patients maybe diagnosed with.  Group discusses the importance of knowing what one's diagnosis is so that one can understand treatment and better advocate for oneself. Healthy Communication:   The focus of this group is to discuss communication, barriers to communication, as well as healthy ways to communicate with others.    Participation Level:  Active  Participation Quality:  Appropriate  Affect:  Appropriate  Cognitive:  Appropriate  Insight: Appropriate  Engagement in Group:  Developing/Improving  Modes of Intervention:  Activity, Discussion, and Education  Additional Comments:    Rosaura Carpenter 10/09/2023, 10:21 AM

## 2023-10-09 NOTE — Group Note (Signed)
 [facility] LCSW Group Therapy Note    Group Date: 10/09/2023 Start Time: 1330 End Time: 1430  Type of Therapy and Topic:  Group Therapy:  Overcoming Obstacles  Participation Level:  [facility] PARTICIPATION LEVEL: None  Mood:  Description of Group:   In this group patients will be encouraged to explore what they see as obstacles to their own wellness and recovery. They will be guided to discuss their thoughts, feelings, and behaviors related to these obstacles. The group will process together ways to cope with barriers, with attention given to specific choices patients can make. Each patient will be challenged to identify changes they are motivated to make in order to overcome their obstacles. This group will be process-oriented, with patients participating in exploration of their own experiences as well as giving and receiving support and challenge from other group members.  Therapeutic Goals: 1. Patient will identify personal and current obstacles as they relate to admission. 2. Patient will identify barriers that currently interfere with their wellness or overcoming obstacles.  3. Patient will identify feelings, thought process and behaviors related to these barriers. 4. Patient will identify two changes they are willing to make to overcome these obstacles:    Summary of Patient Progress Patient was present in group.  However, patient did not engage in group discussion.  Patient fell asleep.   Therapeutic Modalities:   Cognitive Behavioral Therapy Solution Focused Therapy Motivational Interviewing Relapse Prevention Therapy   Harden Mo, LCSW

## 2023-10-09 NOTE — Progress Notes (Signed)
   10/09/23 1345  Spiritual Encounters  Type of Visit Follow up  Care provided to: Patient  Conversation partners present during encounter Social worker/Care management/TOC  Referral source Chaplain team  Reason for visit Routine spiritual support  OnCall Visit No   Chaplain visited patient per the referral of the PRN Chaplain who said the patient shared suicidal ideations.  Patient was sleeping and Chaplain informed staff to put in a consult if patient requests a Chaplain or Chaplain Services needs to come back.    Rev. Rana M. Earlene Plater, MDiv Chaplain Resident [facility]
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
## 2023-10-10 ENCOUNTER — Telehealth: Payer: Self-pay

## 2023-10-10 DIAGNOSIS — F333 Major depressive disorder, recurrent, severe with psychotic symptoms: Secondary | ICD-10-CM | POA: Diagnosis not present

## 2023-10-10 DIAGNOSIS — F1510 Other stimulant abuse, uncomplicated: Secondary | ICD-10-CM | POA: Insufficient documentation

## 2023-10-10 MED ORDER — MELATONIN 5 MG PO TABS: 2.5000 mg | ORAL_TABLET | Freq: Every day | ORAL | Status: DC

## 2023-10-10 MED ORDER — ALUM SULFATE-CA ACETATE EX PACK
5.0000 | PACK | Freq: Every day | CUTANEOUS | Status: AC
  Administered 2023-10-10 – 2023-10-14 (×5): 5 via TOPICAL
  Filled 2023-10-10 (×5): qty 5

## 2023-10-10 MED ORDER — AVEENO MOISTURIZING EX BAR: CHEWABLE_BAR | Freq: Every day | CUTANEOUS | Status: DC

## 2023-10-10 MED ORDER — LORAZEPAM 1 MG PO TABS
1.0000 mg | ORAL_TABLET | Freq: Once | ORAL | Status: AC
  Administered 2023-10-10: 1 mg via ORAL
  Filled 2023-10-10: qty 1

## 2023-10-10 MED ORDER — CALAMINE EX LOTN
1.0000 | TOPICAL_LOTION | CUTANEOUS | Status: DC | PRN
  Filled 2023-10-10: qty 5

## 2023-10-10 MED ORDER — GABAPENTIN 100 MG PO CAPS
200.0000 mg | ORAL_CAPSULE | Freq: Three times a day (TID) | ORAL | Status: DC
  Administered 2023-10-10 – 2023-10-14 (×12): 200 mg via ORAL
  Filled 2023-10-10 (×12): qty 2

## 2023-10-10 MED ORDER — TRAZODONE HCL 50 MG PO TABS
50.0000 mg | ORAL_TABLET | Freq: Every evening | ORAL | Status: DC | PRN
  Administered 2023-10-13: 50 mg via ORAL
  Filled 2023-10-10: qty 1

## 2023-10-10 MED ORDER — MELATONIN 5 MG PO TABS
5.0000 mg | ORAL_TABLET | Freq: Every day | ORAL | Status: DC
  Administered 2023-10-10 – 2023-10-13 (×4): 5 mg via ORAL
  Filled 2023-10-10 (×4): qty 1

## 2023-10-10 NOTE — Telephone Encounter (Signed)
 Call to patient no answer. left voicemail. Patient is currently admitted to behavioral health. Advised I was canceling appointment and to call back to reschedule due to weather and being admitted.
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
## 2023-10-10 NOTE — Group Note (Signed)
 Date:  10/10/2023 Time:  10:33 PM  Group Topic/Focus:  Wrap-Up Group:   The focus of this group is to help patients review their daily goal of treatment and discuss progress on daily workbooks.    Participation Level:  Minimal  Participation Quality:  Appropriate and Attentive  Affect:  Appropriate and Flat  Cognitive:  Alert and Appropriate  Insight: Appropriate  Engagement in Group:  Limited  Modes of Intervention:  Discussion and Orientation  Additional Comments:     Maglione,Thomasine Klutts E 10/10/2023, 10:33 PM

## 2023-10-10 NOTE — Progress Notes (Signed)
   10/10/23 0820  Psych Admission Type (Psych Patients Only)  Admission Status Voluntary  Psychosocial Assessment  Patient Complaints Anxiety;Depression  Eye Contact Brief  Facial Expression Sad  Affect Depressed;Sad  Speech Logical/coherent  Interaction Attention-seeking  Motor Activity Fidgety  Appearance/Hygiene In scrubs  Behavior Characteristics Fidgety;Restless  Mood Depressed;Anxious  Thought Process  Coherency WDL  Content Blaming others  Delusions None reported or observed  Perception Hallucinations  Hallucination None reported or observed  Judgment Poor  Confusion None  Danger to Self  Current suicidal ideation? Passive  Self-Injurious Behavior No self-injurious ideation or behavior indicators observed or expressed   Agreement Not to Harm Self Yes  Description of Agreement Verbal  Danger to Others  Danger to Others None reported or observed

## 2023-10-10 NOTE — Group Note (Signed)
 Recreation Therapy Group Note   Group Topic:Goal Setting  Group Date: 10/10/2023 Start Time: 1000 End Time: 1100 Facilitators: Rosina Lowenstein, LRT, CTRS Location:  Craft Room  Group Description: Product/process development scientist. Patients were given many different magazines, a glue stick, markers, and a piece of cardstock paper. LRT and pts discussed the importance of having goals in life. LRT and pts discussed the difference between short-term and long-term goals, as well as what a SMART goal is. LRT encouraged pts to create a vision board, with images they picked and then cut out with safety scissors from the magazine, for themselves, that capture their short and long-term goals. LRT encouraged pts to show and explain their vision board to the group.   Goal Area(s) Addressed:  Patient will gain knowledge of short vs. long term goals.  Patient will identify goals for themselves. Patient will practice setting SMART goals. Patient will verbalize their goals to LRT and peers.   Affect/Mood: N/A   Participation Level: Did not attend    Clinical Observations/Individualized Feedback: Patient did not attend group.   Plan: Continue to engage patient in RT group sessions 2-3x/week.   Rosina Lowenstein, LRT, CTRS 10/10/2023 12:18 PM

## 2023-10-10 NOTE — Group Note (Signed)
 Date:  10/10/2023 Time:  3:22 PM  Group Topic/Focus:  Activity Group: The focus of the group is to promote activity for the patients and to encourage them to go outside to the courtyard to get some fresh air and some exercise.    Participation Level:  Active  Participation Quality:  Appropriate  Affect:  Appropriate  Cognitive:  Appropriate  Insight: Appropriate  Engagement in Group:  Engaged  Modes of Intervention:  Activity  Additional Comments:    Mary Sella Halton Neas 10/10/2023, 3:22 PM

## 2023-10-10 NOTE — Progress Notes (Addendum)
 [facility] MD Progress Note  10/10/2023 4:00 PM Edgar White  MRN:  [id number] Subjective:  58 year old Caucasian male presents visibly upset, reporting feelings of depression related to his birthday. He stated, "I had a date to leave this earth, but now I am better." Patient exhibits a depressed and anxious mood. He has a history of HIV on Biktarvy. Noted bilateral hands with healing lacerations and a prickly rash over the body.PCR screening on 09/21/23 was positive for anal/rectal gonorrhea (GC). Chlamydia negative. He previously received 1g Rocephin IM on 10/08/22.Bilateral hands with healing lacerations Prickly rash over body, treatment plan initiated.History of previous suicidal thoughts, now verbalizing improvement in mood Principal Problem: MDD (major depressive disorder), recurrent, severe, with psychosis (HCC) Diagnosis: Principal Problem:   MDD (major depressive disorder), recurrent, severe, with psychosis (HCC) Active Problems:   Substance abuse (HCC)   Diabetes mellitus without complication (HCC)   Amphetamine abuse (HCC)  Total Time spent with patient: 1 hour  Past Psychiatric History: MDD  Past Medical History:  Past Medical History:  Diagnosis Date   Basal cell carcinoma 06/22/2022   L medial post shoulder, EDC   Basal cell carcinoma 06/22/2022   L spinal mid back, EDC   Basal cell carcinoma 06/22/2022   L spinal upper back, EDC   Basal cell carcinoma 06/22/2022   R nasal ala, MOHs completed 08/09/22   Basal cell carcinoma 06/22/2022   R upper back, EDC   Depression    Diabetes mellitus without complication (HCC)    Seizures (HCC)     Past Surgical History:  Procedure Laterality Date   INGUINAL HERNIA REPAIR     LUMBAR FUSION     Family History:  Family History  Problem Relation Age of Onset   Cancer Mother    Basal cell carcinoma Sister    Squamous cell carcinoma Sister    Family Psychiatric  History: Mother has depression Social History:  Social History    Substance and Sexual Activity  Alcohol Use No     Social History   Substance and Sexual Activity  Drug Use Yes   Types: Marijuana, Cocaine   Comment: marijuana currently, no recent cocaine use    Social History   Socioeconomic History   Marital status: Single    Spouse name: Not on file   Number of children: Not on file   Years of education: Not on file   Highest education level: Not on file  Occupational History   Not on file  Tobacco Use   Smoking status: Every Day    Current packs/day: 1.00    Average packs/day: 1 pack/day for 32.1 years (32.1 ttl pk-yrs)    Types: Cigarettes    Start date: 71   Smokeless tobacco: Current  Vaping Use   Vaping status: Never Used  Substance and Sexual Activity   Alcohol use: No   Drug use: Yes    Types: Marijuana, Cocaine    Comment: marijuana currently, no recent cocaine use   Sexual activity: Yes    Partners: Male  Other Topics Concern   Not on file  Social History Narrative   Right handed   Lives alone   Caffeine 3 cups daily   Social Drivers of Health   Financial Resource Strain: Not on file  Food Insecurity: Food Insecurity Present (10/08/2023)   Hunger Vital Sign    Worried About Running Out of Food in the Last Year: Sometimes true    Ran Out of Food in the Last Year:  Sometimes true  Transportation Needs: Unmet Transportation Needs (10/08/2023)   PRAPARE - Administrator, Civil Service (Medical): Yes    Lack of Transportation (Non-Medical): Yes  Physical Activity: Not on file  Stress: Not on file  Social Connections: Not on file   Additional Social History:                         Sleep: Good  Appetite:  Good  Current Medications: Current Facility-Administered Medications  Medication Dose Route Frequency Provider Last Rate Last Admin   acetaminophen (TYLENOL) tablet 500 mg  500 mg Oral Q6H PRN Tingling, Stephanie, PA-C       alum & mag hydroxide-simeth (MAALOX/MYLANTA) 200-200-20  MG/5ML suspension 30 mL  30 mL Oral Q4H PRN Tingling, Stephanie, PA-C       aluminum sulfate-calcium acetate (DOMEBORO) packet 5 packet  5 packet Topical Daily Verner Chol, MD       ARIPiprazole (ABILIFY) tablet 5 mg  5 mg Oral Daily Tingling, Stephanie, PA-C   5 mg at 10/10/23 0820   azelastine (ASTELIN) 0.1 % nasal spray 1 spray  1 spray Each Nare BID Tingling, Stephanie, PA-C   1 spray at 10/10/23 0981   bictegravir-emtricitabine-tenofovir AF (BIKTARVY) 50-200-25 MG per tablet 1 tablet  1 tablet Oral Daily Tingling, Stephanie, PA-C   1 tablet at 10/10/23 0820   colloidal oatmeal (AVEENO) medicated soap bar   Topical Daily Myriam Forehand, NP       diphenhydrAMINE (BENADRYL) capsule 25 mg  25 mg Oral Q6H PRN Tingling, Stephanie, PA-C   25 mg at 10/10/23 1423   haloperidol (HALDOL) tablet 5 mg  5 mg Oral TID PRN Tingling, Stephanie, PA-C       And   diphenhydrAMINE (BENADRYL) capsule 50 mg  50 mg Oral TID PRN Tingling, Stephanie, PA-C       divalproex (DEPAKOTE) DR tablet 500 mg  500 mg Oral Q12H Tingling, Stephanie, PA-C   500 mg at 10/10/23 0820   feeding supplement (ENSURE ENLIVE / ENSURE PLUS) liquid 237 mL  237 mL Oral BID BM Tingling, Stephanie, PA-C   237 mL at 10/09/23 1628   gabapentin (NEURONTIN) capsule 200 mg  200 mg Oral TID Myriam Forehand, NP       LORazepam (ATIVAN) tablet 1 mg  1 mg Oral Once Myriam Forehand, NP       magnesium hydroxide (MILK OF MAGNESIA) suspension 30 mL  30 mL Oral Daily PRN Tingling, Stephanie, PA-C       melatonin tablet 5 mg  5 mg Oral QHS Myriam Forehand, NP       metFORMIN (GLUCOPHAGE-XR) 24 hr tablet 500 mg  500 mg Oral BID WC Tingling, Stephanie, PA-C   500 mg at 10/10/23 0820   traZODone (DESYREL) tablet 50 mg  50 mg Oral QHS PRN Myriam Forehand, NP        Lab Results: No results found for this or any previous visit (from the past 48 hours).  Blood Alcohol level:  Lab Results  Component Value Date   ETH <10 10/07/2023   ETH <10 09/25/2023     Metabolic Disorder Labs: Lab Results  Component Value Date   HGBA1C 8.8 (H) 06/14/2023   MPG 205.86 06/14/2023   MPG 208.73 06/13/2023   No results found for: "PROLACTIN" Lab Results  Component Value Date   CHOL 177 06/14/2023   TRIG 315 (H) 06/14/2023   HDL  33 (L) 06/14/2023   CHOLHDL 5.4 06/14/2023   VLDL 63 (H) 06/14/2023   LDLCALC 81 06/14/2023   LDLCALC 101 (H) 06/13/2023    Physical Findings: AIMS:  , ,  ,  ,    CIWA:    COWS:     Musculoskeletal: Strength & Muscle Tone: within normal limits Gait & Station: normal Patient leans: N/A  Psychiatric Specialty Exam:  Presentation  General Appearance:  Appropriate for Environment; Neat (In scrubs, fidgety, restless)  Eye Contact: Good  Speech: Clear and Coherent; Normal Rate (Attention-seeking, visibly upset during discussion but brightened up when discussing past experiences)  Speech Volume: Normal  Handedness: Right   Mood and Affect  Mood: Anxious; Irritable  Affect: Depressed; Tearful   Thought Process  Thought Processes: Goal Directed  Descriptions of Associations:Intact  Orientation:Full (Time, Place and Person)  Thought Content:Logical; WDL  History of Schizophrenia/Schizoaffective disorder:No  Duration of Psychotic Symptoms:Less than six months  Hallucinations:Hallucinations: None  Ideas of Reference:None  Suicidal Thoughts:Suicidal Thoughts: Yes, Passive SI Passive Intent and/or Plan: Without Intent; Without Means to Carry Out; Without Access to Means; Without Plan  Homicidal Thoughts:Homicidal Thoughts: No   Sensorium  Memory: Immediate Good; Recent Good; Remote Good  Judgment: Fair  Insight: Fair   Chartered certified accountant: Fair  Attention Span: Fair  Recall: Good  Fund of Knowledge: Good  Language: Good   Psychomotor Activity  Psychomotor Activity:Psychomotor Activity: Normal   Assets  Assets: Communication Skills; Financial  Resources/Insurance; Social Support   Sleep  Sleep:Sleep: Fair Number of Hours of Sleep: 5    Physical Exam: Physical Exam Vitals and nursing note reviewed.  Constitutional:      Appearance: Normal appearance.  HENT:     Head: Normocephalic and atraumatic.     Nose: Nose normal.  Pulmonary:     Effort: Pulmonary effort is normal.  Musculoskeletal:        General: Normal range of motion.     Cervical back: Normal range of motion.  Skin:    Findings: Rash present.     Comments: Open wound on hands  Neurological:     General: No focal deficit present.     Mental Status: He is alert and oriented to person, place, and time. Mental status is at baseline.  Psychiatric:        Attention and Perception: Attention and perception normal.        Mood and Affect: Mood is depressed. Affect is tearful.        Speech: Speech normal.        Behavior: Behavior is withdrawn. Behavior is cooperative.        Thought Content: Thought content includes suicidal ideation.        Cognition and Memory: Cognition and memory normal.        Judgment: Judgment is impulsive.    Review of Systems  Skin:  Positive for itching and rash.  Psychiatric/Behavioral:  Positive for depression, substance abuse and suicidal ideas. The patient is nervous/anxious and has insomnia.   All other systems reviewed and are negative.  Blood pressure (!) 140/82, pulse 95, temperature 98.4 F (36.9 C), resp. rate 20, height 5\' 10"  (1.778 m), weight 65.3 kg, SpO2 100%. Body mass index is 20.66 kg/m.   Treatment Plan Summary: Daily contact with patient to assess and evaluate symptoms and progress in treatment and Medication management Administer Ativan 1mg  once for anxiety Increase Gabapentin to 200mg  TID for diabetic neuropathy Abilify 5mg  daily for mood  stabilization Depakote 500mg  BID for mood and impulse control Increase Melatonin to 3mg  nightly for insomnia Domeboro soaks prescribed for pruritic rash Educate  patient on safe sex practices and protection Monitor healing of lacerations and rash Reassess for any signs of infection or worsening skin condition Continue monitoring mood and depressive symptoms, reinforce coping strategies Myriam Forehand, NP 10/10/2023, 4:00 PM

## 2023-10-10 NOTE — B[facility] Counselor (Signed)
 CSW spoke with the patient about AFL[facility] and [employer] at the patient's request.    CSW provided psycho-education on the programs.   Patient DECLINED both.  Penni Homans, MSW, LCSW 10/10/2023 4:15 PM

## 2023-10-10 NOTE — Consult Note (Signed)
 WOC Nurse Consult Note: Reason for Consult: wounds and hives Reviewed chart appears patient has scabs over his body from some type of pruritic rash.  Exploring opportunities for pharmacy to provide either domeboro soaks or oatmeal wash for affected areas. Not really amendable to topical care unless he has particular wounds that are large and problematic with drainage and such  Wound type: infectious  Pressure Injury POA: NA  Dressing procedure/placement/frequency: Domeboro soaks at least daily to affected areas or use of oatmeal soap/bar with shower daily. Otherwise leave open to air.    Re consult if needed, will not follow at this time. Thanks  Emrey Thornley [employer], RN,CWOCN, CNS, CWON-AP [phone])

## 2023-10-10 NOTE — Group Note (Signed)
 Date:  10/10/2023 Time:  10:07 AM  Group Topic/Focus:  Dimensions of Wellness:   The focus of this group is to introduce the topic of wellness and discuss the role each dimension of wellness plays in total health. Self Care:   The focus of this group is to help patients understand the importance of self-care in order to improve or restore emotional, physical, spiritual, interpersonal, and financial health.    Participation Level:  Did Not Attend   Mardie Kellen 10/10/2023, 10:07 AM

## 2023-10-10 NOTE — [facility] Suicide Risk Assessment (Signed)
 [facility] INPATIENT:  Family/Significant Other Suicide Prevention Education  Suicide Prevention Education:  Education Completed; Dione Plover, sister, [phone] has been identified by the patient as the family member/significant other with whom the patient will be residing, and identified as the person(s) who will aid the patient in the event of a mental health crisis (suicidal ideations/suicide attempt).  With written consent from the patient, the family member/significant other has been provided the following suicide prevention education, prior to the and/or following the discharge of the patient.  The suicide prevention education provided includes the following: Suicide risk factors Suicide prevention and interventions National Suicide Hotline telephone number [facility] assessment telephone number [facility] Emergency Assistance 911 [facility] and/or Residential Mobile Crisis Unit telephone number  Request made of family/significant other to: Remove weapons (e.g., guns, rifles, knives), all items previously/currently identified as safety concern.   Remove drugs/medications (over-the-counter, prescriptions, illicit drugs), all items previously/currently identified as a safety concern.  The family member/significant other verbalizes understanding of the suicide prevention education information provided.  The family member/significant other agrees to remove the items of safety concern listed above.  Harden Mo 10/10/2023, 9:39 AM

## 2023-10-10 NOTE — Group Note (Addendum)
 [facility] LCSW Group Therapy Note   Group Date: 10/10/2023 Start Time: 1300 End Time: 1400   Type of Therapy/Topic:  Group Therapy:  Emotion Regulation  Participation Level:  Active   Mood:  Description of Group:    The purpose of this group is to assist patients in learning to regulate negative emotions and experience positive emotions. Patients will be guided to discuss ways in which they have been vulnerable to their negative emotions. These vulnerabilities will be juxtaposed with experiences of positive emotions or situations, and patients challenged to use positive emotions to combat negative ones. Special emphasis will be placed on coping with negative emotions in conflict situations, and patients will process healthy conflict resolution skills.  Therapeutic Goals: Patient will identify two positive emotions or experiences to reflect on in order to balance out negative emotions:  Patient will label two or more emotions that they find the most difficult to experience:  Patient will be able to demonstrate positive conflict resolution skills through discussion or role plays:   Summary of Patient Progress: Patient was present in group. Patient was active and supportive of other group members.  Patient reports that he is learning to navigate his intense emotions ans expressed guilt for his past behaviors. Patient reported working to find alternative regulation methods to help challenge his negative thinking to bring about positive feelings and healthier behaviors. Patient was appropriate in group and displayed fair insight.      Therapeutic Modalities:   Cognitive Behavioral Therapy Feelings Identification Dialectical Behavioral Therapy   Lowry Ram, LCSW

## 2023-10-10 NOTE — Plan of Care (Signed)
   Problem: Education: Goal: Emotional status will improve Outcome: Progressing Goal: Mental status will improve Outcome: Progressing Goal: Verbalization of understanding the information provided will improve Outcome: Progressing

## 2023-10-10 NOTE — Plan of Care (Signed)
  Problem: Education: Goal: Knowledge of [place] [facility] Education information/materials will improve Outcome: Progressing Goal: Emotional status will improve Outcome: Progressing Goal: Mental status will improve Outcome: Progressing Goal: Verbalization of understanding the information provided will improve Outcome: Progressing  Patient has been isolative to his room this shift. Denies SI/HI/A/VH and verbally contracts for safety. Compliant with medications no adverse effects noted. Support and encouragement provided.

## 2023-10-11 DIAGNOSIS — S62617A Displaced fracture of proximal phalanx of left little finger, initial encounter for closed fracture: Secondary | ICD-10-CM | POA: Diagnosis not present

## 2023-10-11 LAB — LIPID PANEL
Cholesterol: 183 mg/dL (ref 0–200)
HDL: 26 mg/dL — ABNORMAL LOW (ref >40)
LDL Cholesterol: 127 mg/dL — ABNORMAL HIGH (ref 0–99)
Total CHOL/HDL Ratio: 7 {ratio}
Triglycerides: 148 mg/dL (ref <150)
VLDL: 30 mg/dL (ref 0–40)

## 2023-10-11 NOTE — Plan of Care (Signed)
   Problem: Education: Goal: Emotional status will improve Outcome: Progressing Goal: Mental status will improve Outcome: Progressing

## 2023-10-11 NOTE — Plan of Care (Signed)
   Problem: Education: Goal: Knowledge of [facility] Education information/materials will improve Outcome: Progressing Goal: Emotional status will improve Outcome: Progressing Goal: Mental status will improve Outcome: Progressing Goal: Verbalization of understanding the information provided will improve Outcome: Progressing

## 2023-10-11 NOTE — Progress Notes (Signed)
   10/11/23 0600  15 Minute Checks  Location Bedroom  Visual Appearance Calm  Behavior Sleeping  Sleep (Behavioral Health Patients Only)  Calculate sleep? (Click Yes once per 24 hr at 0600 safety check) Yes  Documented sleep last 24 hours 9.75

## 2023-10-11 NOTE — Inpatient Diabetes Management (Addendum)
 Inpatient Diabetes Program Recommendations  AACE/ADA: New Consensus Statement on Inpatient Glycemic Control   Target Ranges:  Prepandial:   less than 140 mg/dL      Peak postprandial:   less than 180 mg/dL (1-2 hours)      Critically ill patients:  140 - 180 mg/dL    Latest Reference Range & Units 10/07/23 02:04  Glucose 70 - 99 mg/dL 161 (H)    Review of Glycemic Control  Diabetes history: DM2 Outpatient Diabetes medications: Metformin XR 500 mg daily Current orders for Inpatient glycemic control: Metformin XR 500 mg BID  Inpatient Diabetes Program Recommendations:    Inpatient DM medications: While inpatient, please consider ordering CBGs AC&HS and Novolog 0-9 units TID with meals and Novolog 0-5 units at bedtime.  Diet: Please discontinue Regular diet and order Carb Modified diet.  NOTE: Patient with DM2 hx admitted with suicidal ideation and noted to be homeless. Consult placed by N. Earlene Plater, NP for "continuous glucose monitoring".   Patient is not appropriate for continuous glucose monitoring at this time nor outpatient. Made recommendations in chart to consider ordering CBGs and Novolog correction while inpatient BH. Signing off consult.  Thanks, Orlando Penner, RN, MSN, CDCES Diabetes Coordinator Inpatient Diabetes Program [phone] (Team Pager from 8am to 5pm)

## 2023-10-11 NOTE — Progress Notes (Signed)
   10/11/23 1100  Psych Admission Type (Psych Patients Only)  Admission Status Voluntary  Psychosocial Assessment  Patient Complaints Anxiety;Depression  Eye Contact Brief  Facial Expression Sad  Affect Depressed;Sad  Speech Logical/coherent  Interaction Attention-seeking  Motor Activity Fidgety  Appearance/Hygiene Unremarkable  Behavior Characteristics Fidgety;Restless  Mood Depressed  Thought Process  Coherency WDL  Content Blaming others  Delusions None reported or observed  Perception Hallucinations  Hallucination None reported or observed  Judgment Impaired  Confusion None  Danger to Self  Current suicidal ideation? Passive  Self-Injurious Behavior No self-injurious ideation or behavior indicators observed or expressed  (No self-injurious)  Agreement Not to Harm Self Yes  Description of Agreement  (Verbal)  Danger to Others  Danger to Others None reported or observed

## 2023-10-11 NOTE — Progress Notes (Signed)
   10/10/23 2145  Psych Admission Type (Psych Patients Only)  Admission Status Voluntary  Psychosocial Assessment  Patient Complaints Anxiety;Depression  Eye Contact Brief  Facial Expression Sad  Affect Depressed;Sad  Speech Logical/coherent  Interaction Attention-seeking  Motor Activity Fidgety  Appearance/Hygiene Unremarkable  Behavior Characteristics Fidgety;Restless  Mood Depressed  Aggressive Behavior  Effect No apparent injury  Thought Process  Coherency WDL  Content Blaming others;Blaming self  Delusions None reported or observed  Perception Hallucinations  Hallucination None reported or observed  Judgment Impaired  Confusion None  Danger to Self  Current suicidal ideation? Passive  Self-Injurious Behavior No self-injurious ideation or behavior indicators observed or expressed   Agreement Not to Harm Self Yes  Description of Agreement Verbal  Danger to Others  Danger to Others None reported or observed

## 2023-10-11 NOTE — Plan of Care (Signed)
 Alert and oriented x4, RA, and denies SI/HI/AVH and pain. Pt in good mood since visit w/sister. Looking forward to discharge w/hopes of a new car assisted by sisters. Care, comfort, and safety maintained/ongoing.  Problem: Education: Goal: Knowledge of [place] [place] [facility] Education information/materials will improve Outcome: Progressing Goal: Emotional status will improve Outcome: Progressing Goal: Mental status will improve Outcome: Progressing Goal: Verbalization of understanding the information provided will improve Outcome: Progressing   Problem: Activity: Goal: Interest or engagement in activities will improve Outcome: Progressing Goal: Sleeping patterns will improve Outcome: Progressing   Problem: Coping: Goal: Ability to verbalize frustrations and anger appropriately will improve Outcome: Progressing Goal: Ability to demonstrate self-control will improve Outcome: Progressing   Problem: Health Behavior/Discharge Planning: Goal: Identification of resources available to assist in meeting health care needs will improve Outcome: Progressing Goal: Compliance with treatment plan for underlying cause of condition will improve Outcome: Progressing   Problem: Physical Regulation: Goal: Ability to maintain clinical measurements within normal limits will improve Outcome: Progressing   Problem: Safety: Goal: Periods of time without injury will increase Outcome: Progressing

## 2023-10-11 NOTE — Group Note (Signed)
 [facility] LCSW Group Therapy Note   Group Date: 10/11/2023 Start Time: 1300 End Time: 1320  Type of Therapy and Topic:  Group Therapy:  Feelings around Relapse and Recovery  Participation Level:  Active    Description of Group:    Patients in this group will discuss emotions they experience before and after a relapse. They will process how experiencing these feelings, or avoidance of experiencing them, relates to having a relapse. Facilitator will guide patients to explore emotions they have related to recovery. Patients will be encouraged to process which emotions are more powerful. They will be guided to discuss the emotional reaction significant others in their lives may have to patients' relapse or recovery. Patients will be assisted in exploring ways to respond to the emotions of others without this contributing to a relapse.  Therapeutic Goals: Patient will identify two or more emotions that lead to relapse for them:  Patient will identify two emotions that result when they relapse:  Patient will identify two emotions related to recovery:  Patient will demonstrate ability to communicate their needs through discussion and/or role plays.   Summary of Patient Progress: Patient was present for the entirety of the group process. He was actively engaged in the discussion. Pt shared that one thing that he is trying to avoid relapsing into is not taking accountability for his part in his problems. Not taking care of himself was identified a risk factor for relapse. Exercise and taking care of himself was noted as a resiliency factor. Pt appeared open and receptive to feedback/comments from both his peers and facilitator.    Therapeutic Modalities:   Cognitive Behavioral Therapy Solution-Focused Therapy Assertiveness Training Relapse Prevention Therapy   Glenis Smoker, LCSW

## 2023-10-11 NOTE — Group Note (Signed)
 Recreation Therapy Group Note   Group Topic:Other  Group Date: 10/11/2023 Start Time: 1000 End Time: 1050 Facilitators: Clinton Gallant, CTRS Location:  Craft Room  Activity Description/Intervention: Therapeutic Drumming. Patients with peers and staff were given the opportunity to engage in a leader facilitated [facility] Group Empowerment Drumming Circle with staff from the [employer], in partnership with [employer]. Teaching laboratory technician and trained [employer], Theodoro Doing leading with LRT observing and documenting intervention and pt response. This evidenced-based practice targets 7 areas of health and wellbeing in the human experience including: stress-reduction, exercise, self-expression, camaraderie/support, nurturing, spirituality, and music-making (leisure).    Goal Area(s) Addresses:  Patient will engage in pro-social way in music group.  Patient will follow directions of drum leader on the first prompt. Patient will demonstrate no behavioral issues during group.  Patient will identify if a reduction in stress level occurs as a result of participation in therapeutic drum circle.    Affect/Mood: Appropriate   Participation Level: Active   Participation Quality: Independent   Behavior: Appropriate   Speech/Thought Process: Coherent   Insight: Fair   Judgement: Fair    Modes of Intervention: Activity and Music   Patient Response to Interventions:  Engaged   Education Outcome:  Acknowledges education   Clinical Observations/Individualized Feedback: Edgar White was active in their participation of session activities and group discussion. Pt followed along appropriately while interacting well with LRT and peers duration of session.     Plan: Continue to engage patient in RT group sessions 2-3x/week.   Edgar White, LRT, CTRS 10/11/2023 11:59 AM
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
## 2023-10-12 ENCOUNTER — Ambulatory Visit: Payer: MEDICAID | Admitting: Neurology

## 2023-10-12 ENCOUNTER — Telehealth: Payer: Self-pay | Admitting: Neurology

## 2023-10-12 DIAGNOSIS — S62617A Displaced fracture of proximal phalanx of left little finger, initial encounter for closed fracture: Secondary | ICD-10-CM

## 2023-10-12 NOTE — Progress Notes (Signed)
Patient calm and pleasant during assessment denying SI/HI/AVH. Pt observed by this Probation officer interacting appropriately with staff and peers on the unit. Pt compliant with medication administration per MD orders. Pt given education, support, and encouragement to be active in his treatment plan. Pt being monitored Q 15 minutes for safety per unit protocol, remains safe on the unit
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
## 2023-10-12 NOTE — Telephone Encounter (Signed)
 Call to sister Lafonda Mosses, not on recent DPR. Appointment amde 10/18/23 at 1:45pm aware update DPR if applicable

## 2023-10-12 NOTE — Plan of Care (Signed)
   Problem: Education: Goal: Knowledge of [facility] Education information/materials will improve Outcome: Progressing Goal: Emotional status will improve Outcome: Progressing Goal: Mental status will improve Outcome: Progressing

## 2023-10-12 NOTE — Progress Notes (Signed)
   10/12/23 0900  Psych Admission Type (Psych Patients Only)  Admission Status Voluntary  Psychosocial Assessment  Patient Complaints None (pt states 'I feel good")  Eye Contact Fair  Facial Expression Animated  Affect Appropriate to circumstance  Speech Logical/coherent  Interaction Attention-seeking  Motor Activity Other (Comment) (WNL)  Appearance/Hygiene Unremarkable  Behavior Characteristics Restless  Mood Other (Comment) (WNL)  Aggressive Behavior  Effect No apparent injury  Thought Process  Coherency WDL  Content Preoccupation  Delusions None reported or observed  Perception WDL  Hallucination None reported or observed  Judgment Impaired  Confusion None  Danger to Self  Current suicidal ideation? Denies  Self-Injurious Behavior No self-injurious ideation or behavior indicators observed or expressed   Agreement Not to Harm Self Yes  Description of Agreement verbal
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
## 2023-10-12 NOTE — Group Note (Signed)
 Date:  10/12/2023 Time:  4:42 PM  Group Topic/Focus:  Activity Group: The focus of the group is to promote activity for the patients and encourage them to go outside to the courtyard and get some fresh air and some exercise.    Participation Level:  Active  Participation Quality:  Appropriate  Affect:  Appropriate  Cognitive:  Appropriate  Insight: Appropriate  Engagement in Group:  Engaged  Modes of Intervention:  Activity  Additional Comments:    Mary Sella Sarahanne Novakowski 10/12/2023, 4:42 PM

## 2023-10-12 NOTE — Group Note (Signed)
 Date:  10/12/2023 Time:  4:58 AM  Group Topic/Focus:  Building Self Esteem:   The Focus of this group is helping patients become aware of the effects of self-esteem on their lives, the things they and others do that enhance or undermine their self-esteem, seeing the relationship between their level of self-esteem and the choices they make and learning ways to enhance self-esteem.    Participation Level:  Active  Participation Quality:  Appropriate and Attentive  Affect:  Appropriate  Cognitive:  Alert and Appropriate  Insight: Appropriate and Good  Engagement in Group:  Developing/Improving and Engaged  Modes of Intervention:  Clarification, Discussion, Rapport Building, and Support  Additional Comments:     Edgar White 10/12/2023, 4:58 AM

## 2023-10-12 NOTE — Progress Notes (Signed)
 Patient is pleasant and cooperative. Complaints of itching to rash area. Passive SI, verbally contracts for safety. Medication compliant. PRN given for itching. Visible in milieu, watching tv, and socializing. No complaints voiced.  Encouragement and support provided. Safety checks maintained. Medications given as prescribed. Pt receptive and remains safe on unit with q 15 min checks.
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
## 2023-10-12 NOTE — Group Note (Addendum)
 LCSW Group Therapy Note  Group Date: 10/12/2023 Start Time: 1330 End Time: 1445   Type of Therapy and Topic:  Group Therapy: Anger Cues and Responses  Participation Level:  None   Description of Group:   In this group, patients learned how to recognize the physical, cognitive, emotional, and behavioral responses they have to anger-provoking situations.  They identified a recent time they became angry and how they reacted.  They analyzed how their reaction was possibly beneficial and how it was possibly unhelpful.  The group discussed a variety of healthier coping skills that could help with such a situation in the future.  Focus was placed on how helpful it is to recognize the underlying emotions to our anger, because working on those can lead to a more permanent solution as well as our ability to focus on the important rather than the urgent.  Therapeutic Goals: Patients will remember their last incident of anger and how they felt emotionally and physically, what their thoughts were at the time, and how they behaved. Patients will identify how their behavior at that time worked for them, as well as how it worked against them. Patients will explore possible new behaviors to use in future anger situations. Patients will learn that anger itself is normal and cannot be eliminated, and that healthier reactions can assist with resolving conflict rather than worsening situations.  Summary of Patient Progress:    Patient was present in group. However, did not engage in group discussions.  Patient appeared attentive and supportive.   Therapeutic Modalities:   Cognitive Behavioral Therapy    Harden Mo, LCSW 10/12/2023  3:16 PM

## 2023-10-13 DIAGNOSIS — S62617A Displaced fracture of proximal phalanx of left little finger, initial encounter for closed fracture: Secondary | ICD-10-CM | POA: Diagnosis not present

## 2023-10-13 NOTE — Group Note (Signed)
 Date:  10/13/2023 Time:  10:20 AM  Group Topic/Focus:  Goals Group:   The focus of this group is to help patients establish daily goals to achieve during treatment and discuss how the patient can incorporate goal setting into their daily lives to aide in recovery.    Participation Level:  Active  Participation Quality:  Appropriate  Affect:  Appropriate  Cognitive:  Appropriate  Insight: Appropriate  Engagement in Group:  Engaged  Modes of Intervention:  Discussion, Education, and Support  Additional Comments:    Wilford Corner 10/13/2023, 10:20 AM

## 2023-10-13 NOTE — Plan of Care (Signed)
 ?  Problem: Education: ?Goal: Mental status will improve ?Outcome: Progressing ?Goal: Verbalization of understanding the information provided will improve ?Outcome: Progressing ?  ?

## 2023-10-13 NOTE — Plan of Care (Signed)

## 2023-10-13 NOTE — Progress Notes (Signed)
 [facility] MD Progress Note  10/13/2023 6:35 PM Edgar White  MRN:  [id number] Subjective:   58 year old Caucasian male, presents for continued inpatient psychiatric care and was observed engaging in the milieu and interacting on the phone. During the evaluation, he expressed preoccupation with discharge, asking, "When am I going home?"The patient denies current complaints and does not report any active suicidal ideation (SI) or homicidal ideation (HI). He agrees verbally not to harm himself and denies self-injurious behaviors Principal Problem: <principal problem not specified> Diagnosis: Active Problems:   Major depressive disorder, recurrent severe without psychotic features (HCC)   Substance abuse (HCC)   Diabetes mellitus without complication (HCC)   MDD (major depressive disorder), recurrent, severe, with psychosis (HCC)   Amphetamine abuse (HCC)   Closed displaced fracture of proximal phalanx of left little finger  Total Time spent with patient: 1.5 hours Past Psychiatric History: see below  Past Medical History:  Past Medical History:  Diagnosis Date   Basal cell carcinoma 06/22/2022   L medial post shoulder, ED[facility]   Basal cell carcinoma 06/22/2022   L spinal mid back, [facility]   Basal cell carcinoma 06/22/2022   L spinal upper back, [facility]   Basal cell carcinoma 06/22/2022   R nasal ala, MOHs completed 08/09/22   Basal cell carcinoma 06/22/2022   R upper back, [facility]   Depression    Diabetes mellitus without complication (HCC)    Seizures (HCC)     Past Surgical History:  Procedure Laterality Date   INGUINAL HERNIA REPAIR     LUMBAR FUSION     Family History:  Family History  Problem Relation Age of Onset   Cancer Mother    Basal cell carcinoma Sister    Squamous cell carcinoma Sister    Family Psychiatric  History: Mother Anxiety Social History:  Social History   Substance and Sexual Activity  Alcohol Use No     Social History   Substance and Sexual Activity  Drug  Use Yes   Types: Marijuana, Cocaine   Comment: marijuana currently, no recent cocaine use    Social History   Socioeconomic History   Marital status: Single    Spouse name: Not on file   Number of children: Not on file   Years of education: Not on file   Highest education level: Not on file  Occupational History   Not on file  Tobacco Use   Smoking status: Every Day    Current packs/day: 1.00    Average packs/day: 1 pack/day for 32.1 years (32.1 ttl pk-yrs)    Types: Cigarettes    Start date: 5   Smokeless tobacco: Current  Vaping Use   Vaping status: Never Used  Substance and Sexual Activity   Alcohol use: No   Drug use: Yes    Types: Marijuana, Cocaine    Comment: marijuana currently, no recent cocaine use   Sexual activity: Yes    Partners: Male  Other Topics Concern   Not on file  Social History Narrative   Right handed   Lives alone   Caffeine 3 cups daily   Social Drivers of Health   Financial Resource Strain: Not on file  Food Insecurity: Food Insecurity Present (10/08/2023)   Hunger Vital Sign    Worried About Running Out of Food in the Last Year: Sometimes true    Ran Out of Food in the Last Year: Sometimes true  Transportation Needs: Unmet Transportation Needs (10/08/2023)   PRAPARE - Transportation  Lack of Transportation (Medical): Yes    Lack of Transportation (Non-Medical): Yes  Physical Activity: Not on file  Stress: Not on file  Social Connections: Not on file   Additional Social History:                         Sleep: Good  Appetite:  Good  Current Medications: Current Facility-Administered Medications  Medication Dose Route Frequency Provider Last Rate Last Admin   acetaminophen (TYLENOL) tablet 500 mg  500 mg Oral Q6H PRN Tingling, Stephanie, PA-C       alum & mag hydroxide-simeth (MAALOX/MYLANTA) 200-200-20 MG/5ML suspension 30 mL  30 mL Oral Q4H PRN Tingling, Stephanie, PA-C       aluminum sulfate-calcium acetate  (DOMEBORO) packet 5 packet  5 packet Topical Daily Verner Chol, MD   5 packet at 10/13/23 0849   ARIPiprazole (ABILIFY) tablet 5 mg  5 mg Oral Daily Tingling, Stephanie, PA-C   5 mg at 10/13/23 0846   azelastine (ASTELIN) 0.1 % nasal spray 1 spray  1 spray Each Nare BID Tingling, Stephanie, PA-C   1 spray at 10/13/23 0846   bictegravir-emtricitabine-tenofovir AF (BIKTARVY) 50-200-25 MG per tablet 1 tablet  1 tablet Oral Daily Tingling, Stephanie, PA-C   1 tablet at 10/13/23 9629   calamine lotion 1 Application  1 Application Topical PRN Myriam Forehand, NP       diphenhydrAMINE (BENADRYL) capsule 25 mg  25 mg Oral Q6H PRN Tingling, Stephanie, PA-C   25 mg at 10/13/23 0415   haloperidol (HALDOL) tablet 5 mg  5 mg Oral TID PRN Tingling, Stephanie, PA-C       And   diphenhydrAMINE (BENADRYL) capsule 50 mg  50 mg Oral TID PRN Tingling, Stephanie, PA-C       divalproex (DEPAKOTE) DR tablet 500 mg  500 mg Oral Q12H Tingling, Stephanie, PA-C   500 mg at 10/13/23 0846   feeding supplement (ENSURE ENLIVE / ENSURE PLUS) liquid 237 mL  237 mL Oral BID BM Tingling, Stephanie, PA-C   237 mL at 10/13/23 1515   gabapentin (NEURONTIN) capsule 200 mg  200 mg Oral TID Myriam Forehand, NP   200 mg at 10/13/23 1315   magnesium hydroxide (MILK OF MAGNESIA) suspension 30 mL  30 mL Oral Daily PRN Tingling, Stephanie, PA-C       melatonin tablet 5 mg  5 mg Oral QHS Myriam Forehand, NP   5 mg at 10/12/23 2112   metFORMIN (GLUCOPHAGE-XR) 24 hr tablet 500 mg  500 mg Oral BID WC Tingling, Stephanie, PA-C   500 mg at 10/13/23 0847   traZODone (DESYREL) tablet 50 mg  50 mg Oral QHS PRN Myriam Forehand, NP        Lab Results: No results found for this or any previous visit (from the past 48 hours).  Blood Alcohol level:  Lab Results  Component Value Date   ETH <10 10/07/2023   ETH <10 09/25/2023    Metabolic Disorder Labs: Lab Results  Component Value Date   HGBA1C 8.8 (H) 06/14/2023   MPG 205.86 06/14/2023   MPG  208.73 06/13/2023   No results found for: "PROLACTIN" Lab Results  Component Value Date   CHOL 183 10/11/2023   TRIG 148 10/11/2023   HDL 26 (L) 10/11/2023   CHOLHDL 7.0 10/11/2023   VLDL 30 10/11/2023   LDLCALC 127 (H) 10/11/2023   LDLCALC 81 06/14/2023    Physical Findings: AIMS:  , ,  ,  ,  CIWA:    COWS:     Musculoskeletal: Strength & Muscle Tone: within normal limits Gait & Station: normal Patient leans: N/A  Psychiatric Specialty Exam:  Presentation  General Appearance:  Appropriate for Environment; Neat (Restless, fidgety, attention-seeking.)  Eye Contact: Good  Speech: Clear and Coherent; Garbled  Speech Volume: Normal  Handedness: Right   Mood and Affect  Mood: Anxious  Affect: Appropriate   Thought Process  Thought Processes: Coherent (Expresses concern about discharge but no emotional lability noted.)  Descriptions of Associations:Intact  Orientation:Full (Time, Place and Person)  Thought Content:WDL; Logical  History of Schizophrenia/Schizoaffective disorder:No  Duration of Psychotic Symptoms:Less than six months  Hallucinations:Hallucinations: None  Ideas of Reference:None  Suicidal Thoughts:Suicidal Thoughts: No SI Passive Intent and/or Plan: -- (denies)  Homicidal Thoughts:Homicidal Thoughts: No   Sensorium  Memory: Immediate Good; Remote Good; Recent Good  Judgment: Fair (particularly regarding discharge expectations.)  Insight: Fair (Aware of hospitalization but lacks full understanding of treatment needs. Impulse Control:)   Executive Functions  Concentration: Fair  Attention Span: Fair  Recall: Good  Fund of Knowledge: Good  Language: Good   Psychomotor Activity  Psychomotor Activity: Psychomotor Activity: Normal   Assets  Assets: Communication Skills; Financial Resources/Insurance; Resilience   Sleep  Sleep: Sleep: Good Number of Hours of Sleep: 7    Physical Exam: Physical  Exam Vitals and nursing note reviewed.  Constitutional:      Appearance: Normal appearance.  HENT:     Head: Normocephalic and atraumatic.     Nose: Nose normal.  Pulmonary:     Effort: Pulmonary effort is normal.  Musculoskeletal:        General: Normal range of motion.     Cervical back: Normal range of motion.  Neurological:     General: No focal deficit present.     Mental Status: He is alert and oriented to person, place, and time. Mental status is at baseline.  Psychiatric:        Attention and Perception: Attention and perception normal.        Mood and Affect: Mood is anxious. Affect is labile.        Speech: Speech normal.        Behavior: Behavior is cooperative.        Thought Content: Thought content normal.        Cognition and Memory: Cognition and memory normal.        Judgment: Judgment is impulsive.    Review of Systems  Psychiatric/Behavioral:  The patient is nervous/anxious.   All other systems reviewed and are negative.  Blood pressure (!) 140/83, pulse (!) 111, temperature (!) 97.5 F (36.4 C), resp. rate 16, height 5\' 10"  (1.778 m), weight 65.3 kg, SpO2 100%. Body mass index is 20.66 kg/m.   Treatment Plan Summary: Daily contact with patient to assess and evaluate symptoms and progress in treatment and Medication management Continue psychiatric monitoring for mood symptoms and suicidal ideation. Engage in therapy sessions to address anxiety, depression, and coping strategies Gabapentin to 200mg  TID for diabetic neuropathy Abilify 5mg  daily for mood stabilization Depakote 500mg  BID for mood and impulse control Increase Melatonin to 3mg  nightly for insomnia Domeboro soaks prescribed for pruritic rash Educate patient on safe sex practices and protection Monitor healing of lacerations and rash Reassess for any signs of infection or worsening skin condition Continue monitoring mood and depressive symptoms, reinforce coping strategies Myriam Forehand,  NP 10/13/2023, 6:35 PM

## 2023-10-13 NOTE — Group Note (Signed)
 Date:  10/13/2023 Time:  10:26 PM  Group Topic/Focus:  Developing a Wellness Toolbox:   The focus of this group is to help patients develop a "wellness toolbox" with skills and strategies to promote recovery upon discharge.    Participation Level:  Minimal  Participation Quality:  Appropriate  Affect:  Anxious  Cognitive:  Alert  Insight: Good  Engagement in Group:  Limited  Modes of Intervention:  Education  Additional Comments:  Patient presented within normal limits and began group with a positive expectation. However, patient became anxious as one of his peers began to break the safety of the room through their actions. Patient asked staff for assistance and returned to group in better spirits. All paperwork was completed.  Lorenda Ishihara 10/13/2023, 10:26 PM

## 2023-10-13 NOTE — Progress Notes (Signed)
   10/13/23 1500  Psych Admission Type (Psych Patients Only)  Admission Status Voluntary  Psychosocial Assessment  Patient Complaints None  Eye Contact Fair  Facial Expression Animated  Affect Appropriate to circumstance  Speech Logical/coherent  Interaction Attention-seeking  Motor Activity Fidgety  Appearance/Hygiene Unremarkable  Behavior Characteristics Restless  Mood Anxious  Aggressive Behavior  Effect No apparent injury  Thought Process  Coherency WDL  Content Preoccupation  Delusions None reported or observed  Perception WDL  Hallucination None reported or observed  Judgment Limited  Confusion None  Danger to Self  Current suicidal ideation? Denies (Denies)  Self-Injurious Behavior No self-injurious ideation or behavior indicators observed or expressed  (No self-injurious)  Agreement Not to Harm Self Yes  Description of Agreement Verbal  Danger to Others  Danger to Others None reported or observed   Wet to dry dressing and soaks done to both hands. Lacerations on bilateral hands and wrist intact without draining; evidence of healing.

## 2023-10-14 DIAGNOSIS — S62617A Displaced fracture of proximal phalanx of left little finger, initial encounter for closed fracture: Secondary | ICD-10-CM

## 2023-10-14 DIAGNOSIS — Z709 Sex counseling, unspecified: Secondary | ICD-10-CM | POA: Insufficient documentation

## 2023-10-14 DIAGNOSIS — B20 Human immunodeficiency virus [HIV] disease: Secondary | ICD-10-CM | POA: Insufficient documentation

## 2023-10-14 MED ORDER — DIVALPROEX SODIUM 500 MG PO DR TAB
500.0000 mg | DELAYED_RELEASE_TABLET | Freq: Two times a day (BID) | ORAL | 1 refills | Status: DC
Start: 2023-10-14 — End: 2023-12-13

## 2023-10-14 MED ORDER — CALAMINE EX LOTN: 1.0000 | TOPICAL_LOTION | CUTANEOUS | 0 refills | Status: AC | PRN

## 2023-10-14 MED ORDER — GABAPENTIN 100 MG PO CAPS
200.0000 mg | ORAL_CAPSULE | Freq: Three times a day (TID) | ORAL | 0 refills | Status: DC
Start: 2023-10-14 — End: 2024-01-08

## 2023-10-14 MED ORDER — IBUPROFEN 600 MG PO TABS
600.0000 mg | ORAL_TABLET | Freq: Three times a day (TID) | ORAL | Status: DC | PRN
Start: 2023-10-14 — End: 2023-10-14

## 2023-10-14 MED ORDER — ARIPIPRAZOLE 5 MG PO TABS: 5.0000 mg | ORAL_TABLET | Freq: Every day | ORAL | 0 refills | Status: DC

## 2023-10-14 MED ORDER — TRAZODONE HCL 50 MG PO TABS: 50.0000 mg | ORAL_TABLET | Freq: Every evening | ORAL | 0 refills | Status: DC | PRN

## 2023-10-14 MED ORDER — BICTEGRAVIR-EMTRICITAB-TENOFOV 50-200-25 MG PO TABS: 1.0000 | ORAL_TABLET | Freq: Every day | ORAL | 2 refills | Status: AC

## 2023-10-14 MED ORDER — IBUPROFEN 600 MG PO TABS: 600.0000 mg | ORAL_TABLET | Freq: Three times a day (TID) | ORAL | 0 refills | Status: AC | PRN

## 2023-10-14 MED ORDER — METFORMIN HCL ER 500 MG PO TB24
500.0000 mg | ORAL_TABLET | Freq: Two times a day (BID) | ORAL | 0 refills | Status: AC
Start: 2023-10-14 — End: 2025-01-07

## 2023-10-14 MED ORDER — MELATONIN 5 MG PO TABS: 5.0000 mg | ORAL_TABLET | Freq: Every day | ORAL | 0 refills | Status: AC

## 2023-10-14 NOTE — Progress Notes (Signed)
 Patient ID: Edgar White, male   DOB: 26-Aug-1965, 58 y.o.   MRN: [id number]  Patient was discharged from the Behavioral Medicine unit at approx. 1435 escorted by staff. Patient denies SI/HI/AVH. Discharge packet to include printed AVS, Suicide Risk Assessment, and Transition Record reviewed with patient. No 30 day supply of meds. Meds were called into local pharmacy. Belongings returned. Suicide safety plan completed with a copy kept in chart.

## 2023-10-14 NOTE — BHH Suicide Risk Assessment (Addendum)
 [facility] Discharge Suicide Risk Assessment   Principal Problem: <principal problem not specified> Discharge Diagnoses: Active Problems:   Major depressive disorder, recurrent severe without psychotic features (HCC)   Substance abuse (HCC)   Diabetes mellitus without complication (HCC)   Amphetamine abuse (HCC)   Closed displaced fracture of proximal phalanx of left little finger   Human immunodeficiency virus (HIV) disease (HCC)   Total Time spent with patient: 2 hours  Musculoskeletal: Strength & Muscle Tone: within normal limits Gait & Station: normal Patient leans: N/A  Psychiatric Specialty Exam  Presentation  General Appearance:  Neat  Eye Contact: Good  Speech: Clear and Coherent  Speech Volume: Normal  Handedness: Right   Mood and Affect  Mood: Euthymic  Duration of Depression Symptoms:  3 weeks Affect: Appropriate; Congruent   Thought Process  Thought Processes: Coherent  Descriptions of Associations:Intact  Orientation:Full (Time, Place and Person) (and situation)  Thought Content:Logical; WDL  History of Schizophrenia/Schizoaffective disorder:No  Duration of Psychotic Symptoms none noted  Hallucinations:Hallucinations: None  Ideas of Reference:None  Suicidal Thoughts:Suicidal Thoughts: No SI Passive Intent and/or Plan: -- (denies)  Homicidal Thoughts:Homicidal Thoughts: No   Sensorium  Memory: Immediate Good; Recent Good; Remote Good  Judgment: Good  Insight: Good   Executive Functions  Concentration: Good  Attention Span: Good  Recall: Good  Fund of Knowledge: Good  Language: Good   Psychomotor Activity  Psychomotor Activity: Psychomotor Activity: Normal   Assets  Assets: Communication Skills; Desire for Improvement; Financial Resources/Insurance; Social Support; Resilience   Sleep  Sleep: Sleep: Good Number of Hours of Sleep: 7   Physical Exam: Physical Exam Vitals and nursing note reviewed.   Constitutional:      Appearance: Normal appearance.  HENT:     Head: Normocephalic and atraumatic.     Nose: Nose normal.  Pulmonary:     Effort: Pulmonary effort is normal.  Musculoskeletal:        General: Normal range of motion.     Cervical back: Normal range of motion.     Comments: Right shoulder Left 5th digit  Neurological:     General: No focal deficit present.     Mental Status: He is alert and oriented to person, place, and time. Mental status is at baseline.  Psychiatric:        Attention and Perception: Attention and perception normal.        Mood and Affect: Mood and affect normal.        Speech: Speech normal.        Behavior: Behavior normal. Behavior is cooperative.        Thought Content: Thought content normal.        Cognition and Memory: Cognition and memory normal.        Judgment: Judgment normal.    Review of Systems  Musculoskeletal:  Positive for falls and joint pain.       Right shoulder Left 5th digit   Blood pressure 112/75, pulse (!) 107, temperature (!) 97.4 F (36.3 C), resp. rate 20, height 5\' 10"  (1.778 m), weight 65.3 kg, SpO2 100%. Body mass index is 20.66 kg/m.  Mental Status Per Nursing Assessment::   On Admission:  Suicidal ideation indicated by patient, Suicide plan, Plan includes specific time, place, or method  Demographic Factors:  Caucasian, Gay, lesbian, or bisexual orientation, Low socioeconomic status, Living alone, and Unemployed  Loss Factors: Loss of significant relationship, Decline in physical health, and Financial problems/change in socioeconomic status  Historical Factors: Anniversary  of important loss and Impulsivity  Risk Reduction Factors:   Sense of responsibility to family, Religious beliefs about death, Positive social support, Positive therapeutic relationship, and Positive coping skills or problem solving skills  Continued Clinical Symptoms:  Depression:   Comorbid alcohol  abuse/dependence Impulsivity Alcohol/Substance Abuse/Dependencies Medical Diagnoses and Treatments/Surgeries  Cognitive Features That Contribute To Risk:  None    Suicide Risk:  Minimal: No identifiable suicidal ideation.  Patients presenting with no risk factors but with morbid ruminations; may be classified as minimal risk based on the severity of the depressive symptoms   Follow-up Information     [facility] Go to.   Why: Virtual appointment made for 10/18/23 11 AM. Contact information: [address] [place] [address] [phone]                 Plan Of Care/Follow-up recommendations:  Activity:  as tolerated Diet:  heart healthy Depakote:500 mg BID for mood stabilization and impulse control. Abilify: 5 mg PO daily for mood stabilization. Gabapentin: Increase to 200 mg TID to address diabetic neuropathy and assist with seizure control. Melatonin: Increase to 3 mg PO nightly for insomnia. Motrin: 600 mg PO PRN for pain management. Metformin: Initiate Metformin 500 mg BID for diabetes management. Domeboro Soaks: As prescribed for the pruritic rash; monitor skin integrity and healing. Safe Sex Education: Reinforce safe sex practices given the patient's HIV status.  Follow-up with an orthopedic surgeon for both and Left 5th Digit Injury: Reassessment of the small fracture  Follow up with neurologist to further evaluate and monitor the patient's seizure activity and any potential post-head injury effects. Primary Care Provider (PCP) for HIV for ongoing HIV management National Suicide Prevention Lifeline:  Call [phone] (or [phone] if [phone] is [phone]) for immediate crisis intervention and support. These services are available 24/7. Follow up with [facility] for an appointment to further evaluate his ongoing psychiatric needs and ensure continuity of care. This appointment will help address within 7 days Myriam Forehand, NP 10/14/2023, 1:56 PM

## 2023-10-14 NOTE — Group Note (Signed)
 Date:  10/14/2023 Time:  10:43 AM  Group Topic/Focus:  Making Healthy Choices:   The focus of this group is to help patients identify negative/unhealthy choices they were using prior to admission and identify positive/healthier coping strategies to replace them upon discharge. Self Care:   The focus of this group is to help patients understand the importance of self-care in order to improve or restore emotional, physical, spiritual, interpersonal, and financial health.    Participation Level:  Active  Participation Quality:  Appropriate  Affect:  Appropriate  Cognitive:  Appropriate  Insight: Appropriate  Engagement in Group:  Developing/Improving and Engaged  Modes of Intervention:  Activity, Discussion, and Education  Additional Comments:    Rosaura Carpenter 10/14/2023, 10:43 AM
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
## 2023-10-14 NOTE — Progress Notes (Addendum)
  [facility] Adult Case Management Discharge Plan :  Will you be returning to the same living situation after discharge:  No. The patient will be going to [employer]. At discharge, do you have transportation home?: Yes,  The patient stated his sister will pick him up. Do you have the ability to pay for your medications: Yes,  The patient stated that he can pay for discharge medication.  Release of information consent forms completed and in the chart;  Patient's signature needed at discharge.  Patient to Follow up at:  Follow-up Information     [facility] Go to.   Why: Virtual appointment made for 10/18/23 11 AM. Contact information: [address] [place] [address] [phone]                 Next level of care provider has access to [facility] Link:yes  Safety Planning and Suicide Prevention discussed: Yes,  Dione Plover, sister, [phone]     Has patient been referred to the Quitline?: Patient refused referral for treatment  Patient has been referred for addiction treatment: Yes, the patient will follow up with an outpatient provider for substance use disorder. Psychiatrist/APP: appointment made for [facility] at 10/18/23  Marshell Levan, LCSW 10/14/2023, 11:21 AM

## 2023-10-14 NOTE — Plan of Care (Signed)

## 2023-10-18 ENCOUNTER — Ambulatory Visit (INDEPENDENT_AMBULATORY_CARE_PROVIDER_SITE_OTHER): Payer: MEDICAID | Admitting: Neurology

## 2023-10-18 VITALS — BP 134/81 | HR 110 | Ht 70.0 in | Wt 156.0 lb

## 2023-10-18 DIAGNOSIS — I639 Cerebral infarction, unspecified: Secondary | ICD-10-CM

## 2023-10-18 DIAGNOSIS — R259 Unspecified abnormal involuntary movements: Secondary | ICD-10-CM | POA: Diagnosis not present

## 2023-10-18 DIAGNOSIS — F1910 Other psychoactive substance abuse, uncomplicated: Secondary | ICD-10-CM

## 2023-10-18 MED ORDER — DIVALPROEX SODIUM 500 MG PO DR TAB: 500.0000 mg | DELAYED_RELEASE_TABLET | Freq: Two times a day (BID) | ORAL | 11 refills | Status: AC
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
## 2023-11-09 ENCOUNTER — Encounter: Payer: Self-pay | Admitting: Internal Medicine

## 2023-11-09 ENCOUNTER — Ambulatory Visit: Payer: MEDICAID | Admitting: Internal Medicine

## 2023-11-09 ENCOUNTER — Other Ambulatory Visit (HOSPITAL_COMMUNITY)
Admission: RE | Admit: 2023-11-09 | Discharge: 2023-11-09 | Disposition: A | Discharge status: Home / Self Care | Payer: MEDICAID | Source: Ambulatory Visit | Attending: Internal Medicine | Admitting: Internal Medicine

## 2023-11-09 ENCOUNTER — Other Ambulatory Visit: Payer: Self-pay

## 2023-11-09 VITALS — BP 115/66 | HR 97 | Temp 98.3°F | Ht 70.0 in | Wt 150.0 lb

## 2023-11-09 DIAGNOSIS — B20 Human immunodeficiency virus [HIV] disease: Secondary | ICD-10-CM | POA: Diagnosis not present

## 2023-11-09 DIAGNOSIS — Z113 Encounter for screening for infections with a predominantly sexual mode of transmission: Secondary | ICD-10-CM

## 2023-11-09 DIAGNOSIS — F32A Depression, unspecified: Secondary | ICD-10-CM | POA: Diagnosis not present

## 2023-11-09 DIAGNOSIS — Z7252 High risk homosexual behavior: Secondary | ICD-10-CM | POA: Diagnosis not present

## 2023-11-09 DIAGNOSIS — Z1159 Encounter for screening for other viral diseases: Secondary | ICD-10-CM

## 2023-11-09 MED ORDER — DOXYCYCLINE HYCLATE 100 MG PO TABS: 100.0000 mg | ORAL_TABLET | Freq: Two times a day (BID) | ORAL | 3 refills | Status: DC

## 2023-11-09 MED ORDER — ARIPIPRAZOLE 5 MG PO TABS: 5.0000 mg | ORAL_TABLET | Freq: Every day | ORAL | 3 refills | Status: DC
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
## 2023-11-09 NOTE — Patient Instructions (Addendum)
 [facility]: [facility] at [facility] (near [facility]) [address], [address] [place], [place] [address]  [phone]  Hours of Operation: Monday-Friday, 8 a.m. - 5 p.m. Office closed for lunch, 12 p.m. - 1 p.m. The last Open Access walk-in will be taken at 3 p.m. each day  Walk-ins are welcome or you may call ahead to speak with a [facility] staff member regarding availability (preferred method). Please call [phone] for appointment information.   ------------ Please start taking your biktarvy to promote your normal health and immune system and prevent spread of hiv  I have referred you to psychiatry as well ([facility] walkin clinic; another referral is cooking as well)  For doxycycline antibiotics use, this is for if you have emergency sexual exposure, take 2 tablets within 3 days (sooner better) of exposure to reduce risk of sexually transmitted disease. Do not take this every day as written on bottle (only as needed and 2 tablets at each time)  Labs today  See me in 6 weeks   I have refilled abilify for you as well

## 2023-11-10 LAB — CYTOLOGY, (ORAL, ANAL, URETHRAL) ANCILLARY ONLY
Chlamydia: NEGATIVE
Chlamydia: NEGATIVE
Comment: NEGATIVE
Comment: NEGATIVE
Comment: NORMAL
Comment: NORMAL
Neisseria Gonorrhea: NEGATIVE
Neisseria Gonorrhea: NEGATIVE

## 2023-11-10 LAB — URINE CYTOLOGY ANCILLARY ONLY
Chlamydia: NEGATIVE
Comment: NEGATIVE
Comment: NEGATIVE
Comment: NORMAL
Neisseria Gonorrhea: NEGATIVE
Trichomonas: NEGATIVE

## 2023-11-11 LAB — RPR: RPR Ser Ql: NONREACTIVE

## 2023-11-11 LAB — HIV-1 RNA QUANT-NO REFLEX-BLD
HIV 1 RNA Quant: 7490 {copies}/mL — ABNORMAL HIGH
HIV-1 RNA Quant, Log: 3.87 {Log_copies}/mL — ABNORMAL HIGH

## 2023-11-11 LAB — HEPATITIS B SURFACE ANTIBODY, QUANTITATIVE: Hep B S AB Quant (Post): <5 m[IU]/mL — ABNORMAL LOW (ref >=10)

## 2023-11-11 LAB — HEPATITIS B CORE ANTIBODY, TOTAL: Hep B Core Total Ab: NONREACTIVE

## 2023-11-11 LAB — HEPATITIS B SURFACE ANTIGEN: Hepatitis B Surface Ag: NONREACTIVE

## 2023-11-17 ENCOUNTER — Telehealth: Payer: Self-pay

## 2023-11-17 NOTE — Telephone Encounter (Signed)
 Detectable Viral Load Intervention (DVL)  Most recent VL:  HIV 1 RNA Quant  Date Value Ref Range Status  11/09/2023 7,490 (H) NOT DETECTED copies/mL Final  09/21/2023 6,310,000 (H) copies/mL Final    Last Clinic Visit: 11/09/23  Current ART regimen: Biktarvy  Appointment status: patient has future appointment scheduled  Medication last dispensed (per chart review):   Dispensed Days Supply Quantity Provider Pharmacy  BIKTARVY 50-200-25MG  TAB 10/24/2023 30 30 each Myriam Forehand, NP [facility] [phone] .    Medication Adherence   What pharmacy do you use for your ART?   Do you pick up your medication at the pharmacy or is it mailed to you?   How often do you miss a dose your ART?   Are you experiencing any side effects with your ART?   Are you having any trouble remembering what medication(s) you are supposed to take or how you are supposed to take them?   What helps you remember to take your medication(s)?    Barriers to Care   Lack of transportation to medical appointments?   2. Housing instability?  3. If you are currently employed, are you having difficulty taking time off of work for medical appointments?   4. Financial concerns (rent, utilities, etc.)   5. Lack of consistent access to food?   6. Trouble remembering and attending your appointments?   7. Are you experiencing any other barriers that make it hard for you to come to appointments or take medication regularly?    Interventions   Called patient to discuss medication adherence and possible barriers to care. Attempted to call, call answered but dropped. Will send mychart message. Juanita Laster, RMA

## 2023-12-13 ENCOUNTER — Ambulatory Visit (HOSPITAL_COMMUNITY): Payer: MEDICAID | Admitting: Family

## 2023-12-19 ENCOUNTER — Ambulatory Visit: Payer: MEDICAID | Admitting: Internal Medicine

## 2023-12-28 ENCOUNTER — Telehealth: Payer: Self-pay | Admitting: Neurology

## 2023-12-28 NOTE — Telephone Encounter (Signed)
 Call to patient, he reports having daily seizures, last one was this morning. He reports medication compliance. I reviewed [place] driving laws with patient. He reports during his seizures his upper body shakes and he feels like his brain is vibrating throughout the day. He reports having these episodes daily. He reports increased anxiety due to family stress and recent concern for growth in his colon. He reports lack of sleep due to anxiety. He reports being homeless too and getting an apartment next. I advised if having multiple seizures during the day he need to go to ER. He is very anxious and not sure if episodes are anxiety attacks or seizures  Denies SI/HI. He reports reaching out to PCP for anxiety management tomorrow. Advised I would send to Dr. Gracie Lav for review.

## 2023-12-28 NOTE — Telephone Encounter (Signed)
 Pt called stating that he is having seizures daily and not feeling right. He would like to discuss with RN or MD. Please advise.

## 2023-12-29 NOTE — Telephone Encounter (Signed)
 He is already on Depakote  500mg  bid for his mood disorder. I am not convinced current spells are true seizure. Agree with no driving, keep f/o on May 19

## 2024-01-04 ENCOUNTER — Telehealth: Payer: Self-pay | Admitting: Neurology

## 2024-01-04 NOTE — Telephone Encounter (Signed)
 [facility] conf

## 2024-01-08 ENCOUNTER — Ambulatory Visit: Payer: MEDICAID | Admitting: Neurology

## 2024-01-08 ENCOUNTER — Encounter: Payer: Self-pay | Admitting: Neurology

## 2024-01-08 ENCOUNTER — Telehealth: Payer: Self-pay | Admitting: Neurology

## 2024-01-08 VITALS — BP 170/101 | HR 87 | Ht 70.0 in | Wt 150.0 lb

## 2024-01-08 DIAGNOSIS — R259 Unspecified abnormal involuntary movements: Secondary | ICD-10-CM

## 2024-01-08 DIAGNOSIS — I639 Cerebral infarction, unspecified: Secondary | ICD-10-CM

## 2024-01-08 DIAGNOSIS — F1910 Other psychoactive substance abuse, uncomplicated: Secondary | ICD-10-CM
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
## 2024-01-09 ENCOUNTER — Other Ambulatory Visit: Payer: Self-pay | Admitting: Family Medicine

## 2024-01-09 ENCOUNTER — Ambulatory Visit
Admission: RE | Admit: 2024-01-09 | Discharge: 2024-01-09 | Disposition: A | Discharge status: Home / Self Care | Payer: MEDICAID | Attending: Family Medicine | Admitting: Family Medicine

## 2024-01-09 ENCOUNTER — Ambulatory Visit
Admission: RE | Admit: 2024-01-09 | Discharge: 2024-01-09 | Disposition: A | Discharge status: Home / Self Care | Payer: MEDICAID | Source: Ambulatory Visit | Attending: Family Medicine | Admitting: Family Medicine

## 2024-01-09 DIAGNOSIS — R059 Cough, unspecified: Secondary | ICD-10-CM

## 2024-01-10 ENCOUNTER — Encounter: Payer: Self-pay | Admitting: Pediatrics

## 2024-01-11 ENCOUNTER — Telehealth: Payer: Self-pay

## 2024-01-11 NOTE — Telephone Encounter (Signed)
 Per Dr. Gracie Lav, if ambulatory EEG and other tests normal, Will dismiss from practice. Will need to follow up with psych for management of symptoms.

## 2024-01-19 NOTE — Progress Notes (Signed)
 The ASCVD Risk score (Arnett DK, et al., 2019) failed to calculate for the following reasons:   Risk score cannot be calculated because patient has a medical history suggesting prior/existing ASCVD  Arlon Bergamo, BSN, RN

## 2024-02-06 ENCOUNTER — Other Ambulatory Visit: Payer: Self-pay | Admitting: Pediatrics

## 2024-02-06 ENCOUNTER — Ambulatory Visit (AMBULATORY_SURGERY_CENTER): Payer: MEDICAID

## 2024-02-06 ENCOUNTER — Telehealth: Payer: Self-pay

## 2024-02-06 VITALS — Ht 70.0 in | Wt 151.0 lb

## 2024-02-06 DIAGNOSIS — Z1211 Encounter for screening for malignant neoplasm of colon: Secondary | ICD-10-CM

## 2024-02-06 MED ORDER — NA SULFATE-K SULFATE-MG SULF 17.5-3.13-1.6 GM/177ML PO SOLN: 1.0000 | Freq: Once | ORAL | 0 refills | Status: AC

## 2024-02-06 NOTE — Telephone Encounter (Signed)
 John,   I see this patient has been red dotted. He reports seizures daily but per neuro note they are saying anxiety attacks/mood disorder. Please review the pts chart and advise if he is ok to proceed as scheduled.   Thank you, PV

## 2024-02-06 NOTE — Progress Notes (Signed)
 No egg or soy allergy known to patient  No issues known to pt with past sedation with any surgeries or procedures Patient denies ever being told they had issues or difficulty with intubation  No FH of Malignant Hyperthermia Pt is not on diet pills Pt is not on  home 02  Pt is not on blood thinners  Pt denies issues with constipation  No A fib or A flutter Have any cardiac testing pending--no  LOA: independent  Prep: suprep   Patient's chart reviewed by Cathlyn Parsons CNRA prior to previsit and patient appropriate for the [facility].  Previsit completed and red dot placed by patient's name on their procedure day (on provider's schedule).     PV completed with patient. Prep instructions sent via mychart

## 2024-02-07 NOTE — Telephone Encounter (Signed)
 The pharmacy is requesting an alternate medication because Suprep interacts with Biktarvy .  Dr. Yvone Herd is out of the office until next week.

## 2024-02-08 ENCOUNTER — Other Ambulatory Visit: Payer: Self-pay

## 2024-02-08 DIAGNOSIS — Z1211 Encounter for screening for malignant neoplasm of colon: Secondary | ICD-10-CM

## 2024-02-08 MED ORDER — BISACODYL EC 5 MG PO TBEC
5.0000 mg | DELAYED_RELEASE_TABLET | ORAL | 0 refills | Status: AC
Start: 2024-02-08 — End: ?

## 2024-02-08 MED ORDER — PEG 3350-KCL-NA BICARB-NACL 420 G PO SOLR
4000.0000 mL | Freq: Once | ORAL | 0 refills | Status: AC
Start: 2024-02-08 — End: 2024-02-08

## 2024-02-08 NOTE — Telephone Encounter (Signed)
 Rx for golytely sent to pharmacy new prep instructions sent via mychart. Will call and make pt aware of updates.

## 2024-02-09 NOTE — Telephone Encounter (Signed)
 Noted

## 2024-02-12 ENCOUNTER — Telehealth: Payer: Self-pay | Admitting: Neurology

## 2024-02-12 NOTE — Addendum Note (Signed)
 Addended by: Ana Woodroof on: 02/12/2024 11:20 AM   Modules accepted: Orders

## 2024-02-12 NOTE — Telephone Encounter (Signed)
 I cancelled his ambulatory EEG order, he is already treated by Depakote  500 mg twice a day by psychiatrist for his mood disorder, impulse control, please advise him to keep same medications,  Return to clinic for worsening symptoms

## 2024-02-12 NOTE — Telephone Encounter (Signed)
 Pt is asking if Dr Onita can have his 72 hour EEG scheduled at a hospital for him, please call to discuss.

## 2024-02-14 ENCOUNTER — Other Ambulatory Visit: Payer: Self-pay

## 2024-02-14 ENCOUNTER — Other Ambulatory Visit (HOSPITAL_COMMUNITY): Payer: Self-pay

## 2024-02-14 ENCOUNTER — Telehealth: Payer: Self-pay

## 2024-02-14 ENCOUNTER — Ambulatory Visit (INDEPENDENT_AMBULATORY_CARE_PROVIDER_SITE_OTHER): Payer: MEDICAID | Admitting: Internal Medicine

## 2024-02-14 ENCOUNTER — Telehealth: Payer: Self-pay | Admitting: Neurology

## 2024-02-14 ENCOUNTER — Encounter: Payer: Self-pay | Admitting: Internal Medicine

## 2024-02-14 VITALS — BP 112/71 | HR 73 | Temp 97.7°F | Ht 70.0 in | Wt 146.0 lb

## 2024-02-14 DIAGNOSIS — F32A Depression, unspecified: Secondary | ICD-10-CM | POA: Diagnosis not present

## 2024-02-14 DIAGNOSIS — Z23 Encounter for immunization: Secondary | ICD-10-CM

## 2024-02-14 DIAGNOSIS — Z113 Encounter for screening for infections with a predominantly sexual mode of transmission: Secondary | ICD-10-CM

## 2024-02-14 DIAGNOSIS — Z129 Encounter for screening for malignant neoplasm, site unspecified: Secondary | ICD-10-CM

## 2024-02-14 DIAGNOSIS — Z5900 Homelessness unspecified: Secondary | ICD-10-CM | POA: Diagnosis not present

## 2024-02-14 DIAGNOSIS — B20 Human immunodeficiency virus [HIV] disease: Secondary | ICD-10-CM

## 2024-02-14 DIAGNOSIS — Z111 Encounter for screening for respiratory tuberculosis: Secondary | ICD-10-CM

## 2024-02-14 MED ORDER — BICTEGRAVIR-EMTRICITAB-TENOFOV 50-200-25 MG PO TABS: 1.0000 | ORAL_TABLET | Freq: Every day | ORAL | 11 refills | Status: DC

## 2024-02-14 MED ORDER — ATORVASTATIN CALCIUM 40 MG PO TABS: 40.0000 mg | ORAL_TABLET | Freq: Every day | ORAL | 11 refills | Status: DC

## 2024-02-14 NOTE — Telephone Encounter (Signed)
 Pt recevied a [facility] message that his anbulatory eeg was cancelled, he would like a phone call not [facility] to discuss why this was cancelled. I read him the note from Mitchell, but he still wants to talk to someone

## 2024-02-14 NOTE — Telephone Encounter (Signed)
 Pt  was returning call , Informed Pt that he will get a call back
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
## 2024-02-14 NOTE — Patient Instructions (Signed)
 Vaccine: Prevnar 20 Heplisav shot one of 2   Continue biktarvy  (renewed) Lipitor 40mg  once a day prescribed to reduce risk stroke/heart attack   Referral to psychiatry placed Referral to [facility] (resources for patients with hiv and difficult social situation)   Follow up in 3 months

## 2024-02-14 NOTE — Telephone Encounter (Signed)
 Patient called back and I advised that DR, yan cancelled AON EEG due to patenting stating he couldn't do the testing because of being homeless. Dr. Onita states all other testing was normal and not suspicious for true seizures. Will not meet inpatient criteria for EMU. Advised patient to call back if able to proceed with AON EEG. Patient verbalized understanding

## 2024-02-14 NOTE — Telephone Encounter (Signed)
 Pharmacy Patient Advocate Encounter  Insurance verification completed.   The patient is insured through [employer]  Medicaid   Ran test claim for [employer]. Currently a quantity of 6ml is a 30 day supply and the co-pay is 0.00 .   This test claim was processed through [facility]- copay amounts may vary at other pharmacies due to pharmacy/plan contracts, or as the patient moves through the different stages of their insurance plan.

## 2024-02-14 NOTE — Addendum Note (Signed)
 Addended by: CELESTIA LELA HERO on: 02/14/2024 09:38 AM   Modules accepted: Orders

## 2024-02-14 NOTE — Telephone Encounter (Signed)
 Call to patient, no answer. Left message to return call

## 2024-02-14 NOTE — Addendum Note (Signed)
 Addended byBETHA OVERTON FAITH T on: 02/14/2024 09:24 AM   Modules accepted: Orders

## 2024-02-16 ENCOUNTER — Ambulatory Visit: Payer: Self-pay | Admitting: Internal Medicine

## 2024-02-17 LAB — COMPLETE METABOLIC PANEL WITHOUT GFR
AG Ratio: 1.3 (calc) (ref 1.0–2.5)
ALT: 9 U/L (ref 9–46)
AST: 13 U/L (ref 10–35)
Albumin: 4.2 g/dL (ref 3.6–5.1)
Alkaline phosphatase (APISO): 68 U/L (ref 35–144)
BUN: 9 mg/dL (ref 7–25)
CO2: 25 mmol/L (ref 20–32)
Calcium: 10 mg/dL (ref 8.6–10.3)
Chloride: 104 mmol/L (ref 98–110)
Creat: 0.86 mg/dL (ref 0.70–1.30)
Globulin: 3.2 g/dL (ref 1.9–3.7)
Glucose, Bld: 149 mg/dL — ABNORMAL HIGH (ref 65–99)
Potassium: 3.9 mmol/L (ref 3.5–5.3)
Sodium: 138 mmol/L (ref 135–146)
Total Bilirubin: 0.4 mg/dL (ref 0.2–1.2)
Total Protein: 7.4 g/dL (ref 6.1–8.1)

## 2024-02-17 LAB — QUANTIFERON-TB GOLD PLUS
Mitogen-NIL: >10 [IU]/mL
NIL: 0.02 [IU]/mL
QuantiFERON-TB Gold Plus: NEGATIVE
TB1-NIL: 0 [IU]/mL
TB2-NIL: 0 [IU]/mL

## 2024-02-17 LAB — CBC
HCT: 42.4 % (ref 38.5–50.0)
Hemoglobin: 14.4 g/dL (ref 13.2–17.1)
MCH: 30.6 pg (ref 27.0–33.0)
MCHC: 34 g/dL (ref 32.0–36.0)
MCV: 90.2 fL (ref 80.0–100.0)
MPV: 8.7 fL (ref 7.5–12.5)
Platelets: 244 10*3/uL (ref 140–400)
RBC: 4.7 10*6/uL (ref 4.20–5.80)
RDW: 13.8 % (ref 11.0–15.0)
WBC: 4.3 10*3/uL (ref 3.8–10.8)

## 2024-02-17 LAB — T.PALLIDUM AB, TOTAL: T pallidum Antibodies (TP-PA): NONREACTIVE

## 2024-02-17 LAB — HIV-1 RNA QUANT-NO REFLEX-BLD
HIV 1 RNA Quant: 24 {copies}/mL — ABNORMAL HIGH
HIV-1 RNA Quant, Log: 1.38 {Log_copies}/mL — ABNORMAL HIGH

## 2024-02-19 LAB — CYTOLOGY - PAP: Diagnosis: NEGATIVE

## 2024-02-20 NOTE — Progress Notes (Unsigned)
 [facility] History and Physical   Primary Care Physician:  Derick Leita POUR, MD   Reason for Procedure:  Colorectal cancer screening  Plan:    Screening colonoscopy     HPI: Edgar White is a 58 y.o. male undergoing screening colonoscopy for colorectal cancer screening.  This is the patient's first colonoscopy.  No documented family history of colorectal cancer or polyps. Reports that his sister recently had a positive Cologuard test. Patient denies current symptoms of change in bowel habits or rectal bleeding.   Past Medical History:  Diagnosis Date   Basal cell carcinoma 06/22/2022   L medial post shoulder, [facility]   Basal cell carcinoma 06/22/2022   L spinal mid back, [facility]   Basal cell carcinoma 06/22/2022   L spinal upper back, [facility]   Basal cell carcinoma 06/22/2022   R nasal ala, MOHs completed 08/09/22   Basal cell carcinoma 06/22/2022   R upper back, [facility]   Depression    Diabetes mellitus without complication (HCC)    Seizures (HCC)     Past Surgical History:  Procedure Laterality Date   INGUINAL HERNIA REPAIR     LUMBAR FUSION      Prior to Admission medications   Medication Sig Start Date End Date Taking? Authorizing Provider  atorvastatin  (LIPITOR) 40 MG tablet Take 1 tablet (40 mg total) by mouth daily. 02/14/24 02/08/25  Overton Constance DASEN, MD  bictegravir-emtricitabine -tenofovir  AF (BIKTARVY ) 50-200-25 MG TABS tablet Take 1 tablet by mouth daily. 02/14/24   Vu, Constance T, MD  bisacodyl  5 MG EC tablet Take 1 tablet (5 mg total) by mouth as directed. 02/08/24   Suzann Inocente HERO, MD  clonazePAM  (KLONOPIN ) 0.5 MG tablet Take 0.5 mg by mouth daily. 01/26/24   [provider]  divalproex  (DEPAKOTE ) 500 MG DR tablet Take 1 tablet (500 mg total) by mouth every 12 (twelve) hours. 10/18/23 01/07/25  Onita Duos, MD  metFORMIN  (GLUCOPHAGE -XR) 500 MG 24 hr tablet Take 1 tablet (500 mg total) by mouth 2 (two) times daily with a meal. 10/14/23 01/07/25  Nicholaus Brad RAMAN, NP     Current Outpatient Medications  Medication Sig Dispense Refill   bictegravir-emtricitabine -tenofovir  AF (BIKTARVY ) 50-200-25 MG TABS tablet Take 1 tablet by mouth daily. 30 tablet 11   clonazePAM  (KLONOPIN ) 0.5 MG tablet Take 0.5 mg by mouth daily.     divalproex  (DEPAKOTE ) 500 MG DR tablet Take 1 tablet (500 mg total) by mouth every 12 (twelve) hours. 60 tablet 11   metFORMIN  (GLUCOPHAGE -XR) 500 MG 24 hr tablet Take 1 tablet (500 mg total) by mouth 2 (two) times daily with a meal. 60 tablet 0   atorvastatin  (LIPITOR) 40 MG tablet Take 1 tablet (40 mg total) by mouth daily. (Patient not taking: Reported on 02/21/2024) 30 tablet 11   bisacodyl  5 MG EC tablet Take 1 tablet (5 mg total) by mouth as directed. 4 tablet 0   Current Facility-Administered Medications  Medication Dose Route Frequency Provider Last Rate Last Admin   0.9 %  sodium chloride  infusion  500 mL Intravenous Once Jace Dowe M, MD        Allergies as of 02/21/2024   (No Known Allergies)    Family History  Problem Relation Age of Onset   Cancer Mother    Basal cell carcinoma Sister    Squamous cell carcinoma Sister    Colon cancer Neg Hx    Rectal cancer Neg Hx    Stomach cancer Neg Hx  Social History   Socioeconomic History   Marital status: Single    Spouse name: Not on file   Number of children: Not on file   Years of education: Not on file   Highest education level: Not on file  Occupational History   Not on file  Tobacco Use   Smoking status: Every Day    Current packs/day: 1.00    Average packs/day: 1 pack/day for 32.5 years (32.5 ttl pk-yrs)    Types: Cigarettes    Start date: 72   Smokeless tobacco: Former  Building services engineer status: Never Used  Substance and Sexual Activity   Alcohol use: No   Drug use: Yes    Types: Marijuana, Cocaine    Comment: marijuana currently, no recent cocaine use   Sexual activity: Yes    Partners: Male  Other Topics Concern   Not on file  Social  History Narrative   Right handed   Lives alone   Caffeine 3 cups daily   Social Drivers of Health   Financial Resource Strain: Not on file  Food Insecurity: Food Insecurity Present (10/08/2023)   Hunger Vital Sign    Worried About Running Out of Food in the Last Year: Sometimes true    Ran Out of Food in the Last Year: Sometimes true  Transportation Needs: Unmet Transportation Needs (10/08/2023)   PRAPARE - Administrator, Civil Service (Medical): Yes    Lack of Transportation (Non-Medical): Yes  Physical Activity: Not on file  Stress: Not on file  Social Connections: Not on file  Intimate Partner Violence: Not At Risk (10/08/2023)   Humiliation, Afraid, Rape, and Kick questionnaire    Fear of Current or Ex-Partner: No    Emotionally Abused: No    Physically Abused: No    Sexually Abused: No    Review of Systems:  All other review of systems negative except as mentioned in the HPI.  Physical Exam: Vital signs BP 114/70   Pulse 69   Temp (!) 97.4 F (36.3 C) (Temporal)   Ht 5' 10 (1.778 m)   Wt 151 lb (68.5 kg)   SpO2 97%   BMI 21.67 kg/m   General:   Alert,  Well-developed, well-nourished, pleasant and cooperative in NAD Airway:  Mallampati 1 Lungs:  Clear throughout to auscultation.   Heart:  Regular rate and rhythm; no murmurs, clicks, rubs,  or gallops. Abdomen:  Soft, nontender and nondistended. Normal bowel sounds.   Neuro/Psych:  Normal mood and affect. A and O x 3  Inocente Hausen, MD [facility]

## 2024-02-21 ENCOUNTER — Ambulatory Visit: Payer: MEDICAID | Admitting: Pediatrics

## 2024-02-21 ENCOUNTER — Encounter: Payer: Self-pay | Admitting: Pediatrics

## 2024-02-21 VITALS — BP 104/56 | HR 63 | Temp 97.4°F | Resp 13 | Ht 70.0 in | Wt 151.0 lb

## 2024-02-21 DIAGNOSIS — K648 Other hemorrhoids: Secondary | ICD-10-CM | POA: Diagnosis not present

## 2024-02-21 DIAGNOSIS — Z1211 Encounter for screening for malignant neoplasm of colon: Secondary | ICD-10-CM

## 2024-02-21 MED ORDER — SODIUM CHLORIDE 0.9 % IV SOLN: 500.0000 mL | Freq: Once | INTRAVENOUS | Status: DC

## 2024-02-21 NOTE — Progress Notes (Signed)
 Pt sedate, gd SR's, VSS, report to RN

## 2024-02-21 NOTE — Op Note (Signed)
 [facility] Patient Name: Edgar White Procedure Date: 02/21/2024 8:59 AM MRN: [id number] Endoscopist: Inocente Hausen , MD, [id number] Age: 58 Referring MD:  Date of Birth: [birth year] Gender: Male Account #: [id number] Procedure:                Colonoscopy Indications:              Screening for colorectal malignant neoplasm, This                            is the patient's first colonoscopy, Patient reports                            sister recently had a positive Cologuard but has                            not yet had follow-up colonoscopy Procedure:                Pre-Anesthesia Assessment:                           - Prior to the procedure, a History and Physical                            was performed, and patient medications and                            allergies were reviewed. The patient's tolerance of                            previous anesthesia was also reviewed. The risks                            and benefits of the procedure and the sedation                            options and risks were discussed with the patient.                            All questions were answered, and informed consent                            was obtained. Prior Anticoagulants: The patient has                            taken no anticoagulant or antiplatelet agents. ASA                            Grade Assessment: II - A patient with mild systemic                            disease. After reviewing the risks and benefits,                            the patient was deemed in  satisfactory condition to                            undergo the procedure.                           After obtaining informed consent, the colonoscope                            was passed under direct vision. Throughout the                            procedure, the patient's blood pressure, pulse, and                            oxygen saturations were monitored continuously. The                            CF  [id number] #[id number] was introduced through the anus                            and advanced to the cecum, identified by                            appendiceal orifice and ileocecal valve. The                            colonoscopy was performed without difficulty. The                            patient tolerated the procedure well. The quality                            of the bowel preparation was fair. The ileocecal                            valve, appendiceal orifice, and rectum were                            photographed. Scope In: 9:17:29 AM Scope Out: 9:34:57 AM Scope Withdrawal Time: 0 hours 13 minutes 53 seconds  Total Procedure Duration: 0 hours 17 minutes 28 seconds  Findings:                 The perianal and digital rectal examinations were                            normal. Pertinent negatives include normal                            sphincter tone and no palpable rectal lesions.                           A moderate amount of semi-liquid stool was found in  the entire colon, interfering with visualization. A                            larger amount of stool was seen in the cecum and                            right colon. Lavage of the area was performed using                            copious amounts of sterile water, resulting in                            clearance with fair visualization.                           The colon (entire examined portion) appeared                            normal. No large polyps or masses were seen.                           Internal hemorrhoids were found during retroflexion. Complications:            No immediate complications. Estimated blood loss:                            None. Estimated Blood Loss:     Estimated blood loss: none. Impression:               - Preparation of the colon was fair.                           - Stool in the entire examined colon.                           - The entire examined colon is  normal.                           - Internal hemorrhoids.                           - No specimens collected. Recommendation:           - Discharge patient to home (ambulatory).                           - Repeat colonoscopy in 3 years for screening                            purposes due to fair bowel preparation. No large                            polyps or masses were seen. Small polyps could have                            been obscured  by stool. Recommend a 2-day bowel                            prep for next colonoscopy.                           - The findings and recommendations were discussed                            with the patient's family.                           - Return to referring physician.                           - Patient has a contact number available for                            emergencies. The signs and symptoms of potential                            delayed complications were discussed with the                            patient. Return to normal activities tomorrow.                            Written discharge instructions were provided to the                            patient. Inocente Hausen, MD 02/21/2024 9:40:46 AM This report has been signed electronically.

## 2024-02-21 NOTE — Patient Instructions (Signed)

## 2024-02-22 ENCOUNTER — Telehealth: Payer: Self-pay | Admitting: *Deleted

## 2024-02-22 NOTE — Telephone Encounter (Signed)
 Post procedural follow up call placed, no answer. Left VM

## 2024-05-08 ENCOUNTER — Ambulatory Visit: Payer: MEDICAID | Admitting: Internal Medicine

## 2024-05-08 ENCOUNTER — Other Ambulatory Visit: Payer: Self-pay

## 2024-05-08 ENCOUNTER — Other Ambulatory Visit (HOSPITAL_COMMUNITY)
Admission: RE | Admit: 2024-05-08 | Discharge: 2024-05-08 | Disposition: A | Discharge status: Home / Self Care | Payer: MEDICAID | Source: Ambulatory Visit | Attending: Internal Medicine | Admitting: Internal Medicine

## 2024-05-08 ENCOUNTER — Encounter: Payer: Self-pay | Admitting: Internal Medicine

## 2024-05-08 VITALS — BP 118/79 | HR 96 | Temp 97.8°F | Resp 16 | Wt 153.0 lb

## 2024-05-08 DIAGNOSIS — B20 Human immunodeficiency virus [HIV] disease: Secondary | ICD-10-CM | POA: Diagnosis not present

## 2024-05-08 DIAGNOSIS — Z113 Encounter for screening for infections with a predominantly sexual mode of transmission: Secondary | ICD-10-CM | POA: Insufficient documentation

## 2024-05-08 DIAGNOSIS — Z1159 Encounter for screening for other viral diseases: Secondary | ICD-10-CM | POA: Diagnosis not present

## 2024-05-08 MED ORDER — ATORVASTATIN CALCIUM 40 MG PO TABS
40.0000 mg | ORAL_TABLET | Freq: Every day | ORAL | 11 refills | Status: AC
Start: 2024-05-08 — End: 2025-05-03

## 2024-05-08 NOTE — Patient Instructions (Addendum)
 Vaccine: 2nd shot hepatitis b   Continue biktarvy . Please try not to miss any dose; more than 2 doses missed a week is dangerous   See me in 6 months   Labs today: Blood/urine/oral-rectal swab     For stroke/heart risk reduction, even if you have normal cholesterol, please still take lipitor 40 mg once a day
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
## 2024-05-09 LAB — CYTOLOGY, (ORAL, ANAL, URETHRAL) ANCILLARY ONLY
Chlamydia: NEGATIVE
Chlamydia: NEGATIVE
Comment: NEGATIVE
Comment: NEGATIVE
Comment: NORMAL
Comment: NORMAL
Neisseria Gonorrhea: NEGATIVE
Neisseria Gonorrhea: NEGATIVE

## 2024-05-09 LAB — URINE CYTOLOGY ANCILLARY ONLY
Chlamydia: NEGATIVE
Comment: NEGATIVE
Comment: NEGATIVE
Comment: NORMAL
Neisseria Gonorrhea: NEGATIVE
Trichomonas: NEGATIVE

## 2024-05-10 LAB — HIV-1 RNA QUANT-NO REFLEX-BLD
HIV 1 RNA Quant: 26 {copies}/mL — ABNORMAL HIGH
HIV-1 RNA Quant, Log: 1.41 {Log_copies}/mL — ABNORMAL HIGH

## 2024-05-10 LAB — HEPATITIS B SURFACE ANTIBODY, QUANTITATIVE: Hep B S AB Quant (Post): 11 m[IU]/mL (ref >=10)

## 2024-05-10 LAB — RPR: RPR Ser Ql: NONREACTIVE

## 2024-05-24 ENCOUNTER — Emergency Department (HOSPITAL_COMMUNITY): Payer: MEDICAID

## 2024-05-24 ENCOUNTER — Encounter (HOSPITAL_COMMUNITY): Payer: Self-pay | Admitting: Emergency Medicine

## 2024-05-24 ENCOUNTER — Emergency Department (HOSPITAL_COMMUNITY)
Admission: EM | Admit: 2024-05-24 | Discharge: 2024-05-24 | Disposition: A | Discharge status: Home / Self Care | Payer: MEDICAID | Attending: Emergency Medicine | Admitting: Emergency Medicine

## 2024-05-24 ENCOUNTER — Other Ambulatory Visit: Payer: Self-pay

## 2024-05-24 DIAGNOSIS — I10 Essential (primary) hypertension: Secondary | ICD-10-CM | POA: Insufficient documentation

## 2024-05-24 DIAGNOSIS — E119 Type 2 diabetes mellitus without complications: Secondary | ICD-10-CM | POA: Insufficient documentation

## 2024-05-24 DIAGNOSIS — Z21 Asymptomatic human immunodeficiency virus [HIV] infection status: Secondary | ICD-10-CM | POA: Diagnosis not present

## 2024-05-24 DIAGNOSIS — Z7984 Long term (current) use of oral hypoglycemic drugs: Secondary | ICD-10-CM | POA: Diagnosis not present

## 2024-05-24 DIAGNOSIS — Z85828 Personal history of other malignant neoplasm of skin: Secondary | ICD-10-CM | POA: Diagnosis not present

## 2024-05-24 DIAGNOSIS — R079 Chest pain, unspecified: Secondary | ICD-10-CM | POA: Insufficient documentation

## 2024-05-24 DIAGNOSIS — Z79899 Other long term (current) drug therapy: Secondary | ICD-10-CM | POA: Diagnosis not present

## 2024-05-24 DIAGNOSIS — R109 Unspecified abdominal pain: Secondary | ICD-10-CM | POA: Insufficient documentation

## 2024-05-24 LAB — CBC
HCT: 32.6 % — ABNORMAL LOW (ref 39.0–52.0)
Hemoglobin: 11.4 g/dL — ABNORMAL LOW (ref 13.0–17.0)
MCH: 32 pg (ref 26.0–34.0)
MCHC: 35 g/dL (ref 30.0–36.0)
MCV: 91.6 fL (ref 80.0–100.0)
Platelets: 196 K/uL (ref 150–400)
RBC: 3.56 MIL/uL — ABNORMAL LOW (ref 4.22–5.81)
RDW: 11.5 % (ref 11.5–15.5)
WBC: 7.5 K/uL (ref 4.0–10.5)
nRBC: 0 % (ref 0.0–0.2)

## 2024-05-24 LAB — RAPID URINE DRUG SCREEN, HOSP PERFORMED
Amphetamines: NOT DETECTED
Barbiturates: NOT DETECTED
Benzodiazepines: NOT DETECTED
Cocaine: NOT DETECTED
Opiates: NOT DETECTED
Tetrahydrocannabinol: POSITIVE — AB

## 2024-05-24 LAB — COMPREHENSIVE METABOLIC PANEL WITH GFR
ALT: 19 U/L (ref 0–44)
AST: 27 U/L (ref 15–41)
Albumin: 3 g/dL — ABNORMAL LOW (ref 3.5–5.0)
Alkaline Phosphatase: 76 U/L (ref 38–126)
Anion gap: 9 (ref 5–15)
BUN: 6 mg/dL (ref 6–20)
CO2: 22 mmol/L (ref 22–32)
Calcium: 8.4 mg/dL — ABNORMAL LOW (ref 8.9–10.3)
Chloride: 98 mmol/L (ref 98–111)
Creatinine, Ser: 0.87 mg/dL (ref 0.61–1.24)
GFR, Estimated: >60 mL/min (ref >60)
Glucose, Bld: 167 mg/dL — ABNORMAL HIGH (ref 70–99)
Potassium: 3.6 mmol/L (ref 3.5–5.1)
Sodium: 129 mmol/L — ABNORMAL LOW (ref 135–145)
Total Bilirubin: 0.5 mg/dL (ref 0.0–1.2)
Total Protein: 6.8 g/dL (ref 6.5–8.1)

## 2024-05-24 LAB — I-STAT CHEM 8, ED
BUN: 6 mg/dL (ref 6–20)
Calcium, Ion: 1.14 mmol/L — ABNORMAL LOW (ref 1.15–1.40)
Chloride: 95 mmol/L — ABNORMAL LOW (ref 98–111)
Creatinine, Ser: 0.8 mg/dL (ref 0.61–1.24)
Glucose, Bld: 175 mg/dL — ABNORMAL HIGH (ref 70–99)
HCT: 33 % — ABNORMAL LOW (ref 39.0–52.0)
Hemoglobin: 11.2 g/dL — ABNORMAL LOW (ref 13.0–17.0)
Potassium: 3.6 mmol/L (ref 3.5–5.1)
Sodium: 131 mmol/L — ABNORMAL LOW (ref 135–145)
TCO2: 23 mmol/L (ref 22–32)

## 2024-05-24 LAB — ETHANOL: Alcohol, Ethyl (B): <15 mg/dL (ref <15)

## 2024-05-24 LAB — TROPONIN I (HIGH SENSITIVITY)
Troponin I (High Sensitivity): 9 ng/L (ref <18)
Troponin I (High Sensitivity): 10 ng/L (ref <18)

## 2024-05-24 MED ORDER — SODIUM CHLORIDE 0.9 % IV BOLUS
1000.0000 mL | Freq: Once | INTRAVENOUS | Status: AC
  Administered 2024-05-24: 1000 mL via INTRAVENOUS

## 2024-05-24 MED ORDER — IOHEXOL 350 MG/ML SOLN
100.0000 mL | Freq: Once | INTRAVENOUS | Status: AC | PRN
  Administered 2024-05-24: 100 mL via INTRAVENOUS

## 2024-05-24 MED ORDER — ACETAMINOPHEN 325 MG PO TABS
650.0000 mg | ORAL_TABLET | Freq: Once | ORAL | Status: AC
  Administered 2024-05-24: 650 mg via ORAL
  Filled 2024-05-24: qty 2

## 2024-05-24 MED ORDER — KETOROLAC TROMETHAMINE 15 MG/ML IJ SOLN
15.0000 mg | Freq: Once | INTRAMUSCULAR | Status: AC
  Administered 2024-05-24: 15 mg via INTRAVENOUS
  Filled 2024-05-24: qty 1

## 2024-05-24 NOTE — ED Provider Notes (Signed)
 [place] EMERGENCY DEPARTMENT AT [facility] Provider Note   CSN: [id number] Arrival date & time: 05/24/24  8091     Patient presents with: Chest Pain   Rachel Samples is a 58 y.o. male.   Patient is a 58 year old male with a history of HIV on antiretroviral therapy, hyperlipidemia, hypertension, prior CVA, diabetes, seizures who presents with chest pain.  He said it started about an hour prior to arrival.   He describes it as a sharp pain in the center of his chest.  It goes to the left side.  It also seems to go across his lower back.  He says it is the same type pain.  No pain between his shoulder blades.  No associated shortness of breath.  He says it is worse when he moves.  He said it started when he rolled over in bed and wondered if he pulled something.  He does have some associated abdominal pain.  No nausea or vomiting.  He has some tingling in his feet.  He has a prior history of substance abuse.  Currently says he only uses marijuana and denies any cocaine or methamphetamine use.  Denies any other drug use.  No alcohol use.       Prior to Admission medications   Medication Sig Start Date End Date Taking? Authorizing Provider  atorvastatin  (LIPITOR) 40 MG tablet Take 1 tablet (40 mg total) by mouth daily. 05/08/24 05/03/25  Vu, Constance DASEN, MD  bictegravir-emtricitabine -tenofovir  AF (BIKTARVY ) 50-200-25 MG TABS tablet Take 1 tablet by mouth daily. 02/14/24   Vu, Constance T, MD  bisacodyl  5 MG EC tablet Take 1 tablet (5 mg total) by mouth as directed. 02/08/24   Suzann Inocente HERO, MD  clonazePAM  (KLONOPIN ) 0.5 MG tablet Take 0.5 mg by mouth daily. Patient not taking: Reported on 05/08/2024 01/26/24   [provider]  divalproex  (DEPAKOTE ) 500 MG DR tablet Take 1 tablet (500 mg total) by mouth every 12 (twelve) hours. 10/18/23 01/07/25  Onita Duos, MD  metFORMIN  (GLUCOPHAGE -XR) 500 MG 24 hr tablet Take 1 tablet (500 mg total) by mouth 2 (two) times daily with a  meal. Patient not taking: Reported on 05/08/2024 10/14/23 01/07/25  Nicholaus Brad RAMAN, NP    Allergies: Patient has no known allergies.    Review of Systems  Constitutional:  Negative for chills, diaphoresis, fatigue and fever.  HENT:  Negative for congestion, rhinorrhea and sneezing.   Eyes: Negative.   Respiratory:  Negative for cough, chest tightness and shortness of breath.   Cardiovascular:  Positive for chest pain. Negative for leg swelling.  Gastrointestinal:  Positive for abdominal pain. Negative for diarrhea, nausea and vomiting.  Genitourinary:  Negative for difficulty urinating, flank pain and frequency.  Musculoskeletal:  Negative for arthralgias and back pain.  Skin:  Negative for rash.  Neurological:  Positive for numbness (Tingling in his feet). Negative for dizziness, speech difficulty, weakness and headaches.    Updated Vital Signs BP 102/66   Pulse 77   Temp 98.2 F (36.8 C)   Resp 18   SpO2 100%   Physical Exam Constitutional:      Appearance: He is well-developed.     Comments: Tearful  HENT:     Head: Normocephalic and atraumatic.  Eyes:     Pupils: Pupils are equal, round, and reactive to light.  Cardiovascular:     Rate and Rhythm: Regular rhythm. Tachycardia present.     Heart sounds: Normal heart sounds.  Pulmonary:  Effort: Pulmonary effort is normal. No respiratory distress.     Breath sounds: Normal breath sounds. No wheezing or rales.  Chest:     Chest wall: No tenderness.  Abdominal:     General: Bowel sounds are normal.     Palpations: Abdomen is soft.     Tenderness: There is abdominal tenderness (Diffuse abdominal tenderness). There is no guarding or rebound.  Musculoskeletal:        General: Normal range of motion.     Cervical back: Normal range of motion and neck supple.  Lymphadenopathy:     Cervical: No cervical adenopathy.  Skin:    General: Skin is warm and dry.     Findings: No rash.  Neurological:     Mental Status: He is  alert and oriented to person, place, and time.     Comments: Motor 5 out of 5 all extremities, sensation grossly intact to light touch all extremities, pedal pulses are intact     (all labs ordered are listed, but only abnormal results are displayed) Labs Reviewed  CBC - Abnormal; Notable for the following components:      Result Value   RBC 3.56 (*)    Hemoglobin 11.4 (*)    HCT 32.6 (*)    All other components within normal limits  COMPREHENSIVE METABOLIC PANEL WITH GFR - Abnormal; Notable for the following components:   Sodium 129 (*)    Glucose, Bld 167 (*)    Calcium  8.4 (*)    Albumin 3.0 (*)    All other components within normal limits  RAPID URINE DRUG SCREEN, HOSP PERFORMED - Abnormal; Notable for the following components:   Tetrahydrocannabinol POSITIVE (*)    All other components within normal limits  I-STAT CHEM 8, ED - Abnormal; Notable for the following components:   Sodium 131 (*)    Chloride 95 (*)    Glucose, Bld 175 (*)    Calcium , Ion 1.14 (*)    Hemoglobin 11.2 (*)    HCT 33.0 (*)    All other components within normal limits  ETHANOL  TROPONIN I (HIGH SENSITIVITY)  TROPONIN I (HIGH SENSITIVITY)    EKG: EKG Interpretation Date/Time:  Friday May 24 2024 19:19:57 EDT Ventricular Rate:  105 PR Interval:  132 QRS Duration:  112 QT Interval:  326 QTC Calculation: 430 R Axis:   50  Text Interpretation: Sinus tachycardia Nonspecific ST abnormality Abnormal ECG When compared with ECG of 07-Oct-2023 02:21, PREVIOUS ECG IS PRESENT since last tracing no significant change Confirmed by Lenor Hollering [phone]) on 05/24/2024 7:46:06 PM  Radiology: CT Angio Chest/Abd/Pel for Dissection W and/or W/WO Result Date: 05/24/2024 EXAM: CTA CHEST, ABDOMEN AND PELVIS WITH AND WITHOUT CONTRAST 05/24/2024 09:11:59 PM TECHNIQUE: CTA of the chest was performed with and without the administration of intravenous contrast. CTA of the abdomen and pelvis was performed with and  without the administration of intravenous contrast. The contrast used was of iohexol  (OMNIPAQUE ) 350 MG/ML injection. Multiplanar reformatted images are provided for review. MIP images are provided for review. Automated exposure control, iterative reconstruction, and/or weight based adjustment of the mA/kV was utilized to reduce the radiation dose to as low as reasonably achievable. COMPARISON: None available. CLINICAL HISTORY: Acute aortic syndrome (AAS) suspected. FINDINGS: VASCULATURE: AORTA: No acute finding. No abdominal aortic aneurysm. No dissection. PULMONARY ARTERIES: No pulmonary embolism with the limits of this exam. GREAT VESSELS OF AORTIC ARCH: No acute finding. No dissection. No arterial occlusion or significant stenosis. CELIAC  TRUNK: No acute finding. No occlusion or significant stenosis. SUPERIOR MESENTERIC ARTERY: No acute finding. No occlusion or significant stenosis. INFERIOR MESENTERIC ARTERY: No acute finding. No occlusion or significant stenosis. RENAL ARTERIES: 50-75% stenosis of the left renal artery origin secondary to calcified atherosclerotic plaque. The right renal artery is widely patent. ILIAC ARTERIES: No acute finding. No occlusion or significant stenosis. CHEST: MEDIASTINUM: No mediastinal lymphadenopathy. The heart and pericardium demonstrate no acute abnormality. LUNGS AND PLEURA: The lungs are without acute process. No focal consolidation or pulmonary edema. No evidence of pleural effusion or pneumothorax. THORACIC BONES AND SOFT TISSUES: No acute bone or soft tissue abnormality. ABDOMEN AND PELVIS: LIVER: The liver is unremarkable. GALLBLADDER AND BILE DUCTS: Gallbladder is unremarkable. No biliary ductal dilatation. SPLEEN: The spleen is unremarkable. PANCREAS: The pancreas is unremarkable. ADRENAL GLANDS: Bilateral adrenal glands demonstrate no acute abnormality. KIDNEYS, URETERS AND BLADDER: No stones in the kidneys or ureters. No hydronephrosis. No perinephric or  periureteral stranding. Urinary bladder is unremarkable. GI AND BOWEL: Stomach and duodenal sweep demonstrate no acute abnormality. There is no bowel obstruction. No abnormal bowel wall thickening or distension. REPRODUCTIVE: Reproductive organs are unremarkable. PERITONEUM AND RETROPERITONEUM: No ascites or free air. LYMPH NODES: No lymphadenopathy. ABDOMINAL BONES AND SOFT TISSUES: L4-5 anterior lumbar discectomy and fusion with interbody bone cage is noted. Bilateral L4 laminectomy and resection of the spinous process. No acute abnormality of the bones. No acute soft tissue abnormality. IMPRESSION: 1. No evidence of acute aortic syndrome. 2. 50-75% stenosis of the left renal artery origin secondary to calcified atherosclerotic plaque. Right renal artery is widely patent. Electronically signed by: Dorethia Molt MD 05/24/2024 09:20 PM EDT RP Workstation: [id number]   DG Chest 2 View Result Date: 05/24/2024 CLINICAL DATA:  Chest pain EXAM: CHEST - 2 VIEW COMPARISON:  01/09/2024 FINDINGS: The heart size and mediastinal contours are within normal limits. Both lungs are clear. The visualized skeletal structures are unremarkable. IMPRESSION: No active cardiopulmonary disease. Electronically Signed   By: Luke Bun M.D.   On: 05/24/2024 20:04     Procedures   Medications Ordered in the ED  acetaminophen  (TYLENOL ) tablet 650 mg (650 mg Oral Given 05/24/24 2011)  iohexol  (OMNIPAQUE ) 350 MG/ML injection 100 mL (100 mLs Intravenous Contrast Given 05/24/24 2112)  sodium chloride  0.9 % bolus 1,000 mL (1,000 mLs Intravenous New Bag/Given 05/24/24 2201)  ketorolac  (TORADOL ) 15 MG/ML injection 15 mg (15 mg Intravenous Given 05/24/24 2201)                                    Medical Decision Making Amount and/or Complexity of Data Reviewed Labs: ordered. Radiology: ordered.  Risk Prescription drug management.   This patient presents to the ED for concern of chest and back pain, this involves an extensive  number of treatment options, and is a complaint that carries with it a high risk of complications and morbidity.  I considered the following differential and admission for this acute, potentially life threatening condition.  The differential diagnosis includes ACS, aortic dissection, PE, pneumonia, pleurisy, pneumothorax, musculoskeletal pain, pericarditis  MDM:    Patient is a 58 year old male with a history of HIV on antiretroviral therapy, diabetes, hypertension who presents with acute onset of pain in his chest his low back and his abdomen.  He basely says it hurts all over.  It is worse with movements.  It is reproducible on palpation of the chest,  back and abdomen.  He is tearful on exam and tachycardic.  His EKG does not show any ischemic changes.  His labs are reassuring.  His troponins are negative.  He had a CTA of his chest abdomen pelvis which was negative for aortic dissection or other acute abnormality.  His UDS was positive for THC but otherwise negative.  EtOH is negative.  His tachycardia has resolved.  No hypoxia or other clinical concerns for PE.  On reexam, he says that he is feeling much better.  He does get tearful and says he is anxious about his health.  He is currently homeless although he is staying with a friend.  No thoughts of self-harm.  He says he has been taking his medications.  He was discharged in good condition.  Was encouraged to have follow-up with his PCP.  Return precautions were given.  (Labs, imaging, consults)  Labs: I Ordered, and personally interpreted labs.  The pertinent results include: Negative troponins, negative EtOH, UDS positive for THC  Imaging Studies ordered: I ordered imaging studies including CTA chest abdomen pelvis, chest x-ray I independently visualized and interpreted imaging. I agree with the radiologist interpretation  Additional history obtained from chart.  External records from outside source obtained and reviewed including prior  notes  Cardiac Monitoring: The patient was maintained on a cardiac monitor.  If on the cardiac monitor, I personally viewed and interpreted the cardiac monitored which showed an underlying rhythm of: Sinus rhythm  Reevaluation: After the interventions noted above, I reevaluated the patient and found that they have :improved  Social Determinants of Health:  homeless  Disposition: Discharged  Co morbidities that complicate the patient evaluation  Past Medical History:  Diagnosis Date   Basal cell carcinoma 06/22/2022   L medial post shoulder, [facility]   Basal cell carcinoma 06/22/2022   L spinal mid back, [facility]   Basal cell carcinoma 06/22/2022   L spinal upper back, [facility]   Basal cell carcinoma 06/22/2022   R nasal ala, MOHs completed 08/09/22   Basal cell carcinoma 06/22/2022   R upper back, [facility]   Depression    Diabetes mellitus without complication (HCC)    Seizures (HCC)      Medicines Meds ordered this encounter  Medications   acetaminophen  (TYLENOL ) tablet 650 mg   iohexol  (OMNIPAQUE ) 350 MG/ML injection 100 mL   sodium chloride  0.9 % bolus 1,000 mL   ketorolac  (TORADOL ) 15 MG/ML injection 15 mg    I have reviewed the patients home medicines and have made adjustments as needed  Problem List / ED Course: Problem List Items Addressed This Visit   None            Final diagnoses:  None    ED Discharge Orders     None          Lenor Hollering, MD 05/24/24 2308

## 2024-05-24 NOTE — ED Notes (Signed)
 Pt provided with food and water. Pt remains tearful throughout conversations.

## 2024-05-24 NOTE — Discharge Instructions (Addendum)
 You need to make an appointment to have close follow-up with your primary care doctor.  You did have some narrowing of your renal artery that will need to be monitored.  Return to the emergency room if you have any worsening symptoms.

## 2024-05-24 NOTE — ED Triage Notes (Signed)
 Pt arrive by EMS from home for c/o left cp that started 45 min prior to arrival. 324 mg ASA given prior to arrival.  HR 105, BP 130/80, 20 RR, 98% RA.

## 2024-05-24 NOTE — ED Provider Triage Note (Signed)
 Emergency Medicine Provider Triage Evaluation Note  Edgar White , a 58 y.o. male  was evaluated in triage.  Pt complains of chest pain. 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 Acute onset of chest pain an hour prior to arrival. No hx of CAD and no active cocaine use.   Review of Systems  Positive: Chest pain  Negative:   Physical Exam  There were no vitals taken for this visit. Gen:   Awake, uncomfortable  Resp:  Normal effort  MSK:   Moves extremities without difficulty  Other:    Medical Decision Making  Medically screening exam initiated at 7:18 PM.  Appropriate orders placed.  Edgar White was informed that the remainder of the evaluation will be completed by another provider, this initial triage assessment does not replace that evaluation, and the importance of remaining in the ED until their evaluation is complete.  Edgar White is a 58 y.o. male here with chest pain. Hx of HIV and drug abuse. Appears uncomfortable. EKG showed no STEMI. Ordered labs to r/o ACS      Patt Alm Macho, MD 05/24/24 [phone]

## 2024-05-24 NOTE — ED Notes (Signed)
 Urine sample sent with all labs.

## 2024-06-16 ENCOUNTER — Other Ambulatory Visit: Payer: Self-pay

## 2024-06-16 ENCOUNTER — Emergency Department (HOSPITAL_BASED_OUTPATIENT_CLINIC_OR_DEPARTMENT_OTHER)
Admission: EM | Admit: 2024-06-16 | Discharge: 2024-06-16 | Disposition: A | Discharge status: Home / Self Care | Payer: MEDICAID | Attending: Emergency Medicine | Admitting: Emergency Medicine

## 2024-06-16 ENCOUNTER — Encounter (HOSPITAL_BASED_OUTPATIENT_CLINIC_OR_DEPARTMENT_OTHER): Payer: Self-pay

## 2024-06-16 DIAGNOSIS — M79641 Pain in right hand: Secondary | ICD-10-CM | POA: Insufficient documentation

## 2024-06-16 DIAGNOSIS — M79642 Pain in left hand: Secondary | ICD-10-CM | POA: Diagnosis not present

## 2024-06-16 DIAGNOSIS — Z8669 Personal history of other diseases of the nervous system and sense organs: Secondary | ICD-10-CM

## 2024-06-16 MED ORDER — KETOROLAC TROMETHAMINE 30 MG/ML IJ SOLN
60.0000 mg | Freq: Once | INTRAMUSCULAR | Status: AC
  Administered 2024-06-16: 60 mg via INTRAMUSCULAR
  Filled 2024-06-16: qty 2

## 2024-06-16 MED ORDER — PREDNISONE 10 MG PO TABS
20.0000 mg | ORAL_TABLET | Freq: Two times a day (BID) | ORAL | 0 refills | Status: AC
Start: 2024-06-16 — End: ?

## 2024-06-16 MED ORDER — PREDNISONE 20 MG PO TABS
40.0000 mg | ORAL_TABLET | Freq: Once | ORAL | Status: AC
  Administered 2024-06-16: 40 mg via ORAL
  Filled 2024-06-16: qty 2

## 2024-06-16 NOTE — Discharge Instructions (Signed)
 Begin taking prednisone  as prescribed.  Take ibuprofen  600 mg every 6 hours as needed for pain.  Follow-up with your primary doctor if not improving in the next few days.

## 2024-06-16 NOTE — ED Triage Notes (Signed)
 Pt reports bilateral hand pain from neuropathy. Pt reports taking Gabapentin .

## 2024-06-16 NOTE — ED Provider Notes (Signed)
 [place] EMERGENCY DEPARTMENT AT [facility] Provider Note   CSN: [id number] Arrival date & time: 06/16/24  1237     Patient presents with: Hand Pain (/)   Edgar White is a 58 y.o. male.   Patient is a 58 year old male with history of peripheral neuropathy causing chronic hand pain.  Patient is on gabapentin  for this.  He presents today with complaints of severe bilateral hand pain that has worsened over the past several days.  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  He denies any new injury or trauma otherwise.       Prior to Admission medications   Medication Sig Start Date End Date Taking? Authorizing Provider  atorvastatin  (LIPITOR) 40 MG tablet Take 1 tablet (40 mg total) by mouth daily. 05/08/24 05/03/25  Vu, Constance DASEN, MD  bictegravir-emtricitabine -tenofovir  AF (BIKTARVY ) 50-200-25 MG TABS tablet Take 1 tablet by mouth daily. 02/14/24   Vu, Constance DASEN, MD  bisacodyl  5 MG EC tablet Take 1 tablet (5 mg total) by mouth as directed. 02/08/24   Suzann Inocente HERO, MD  clonazePAM  (KLONOPIN ) 0.5 MG tablet Take 0.5 mg by mouth daily. Patient not taking: Reported on 05/08/2024 01/26/24   [provider]  divalproex  (DEPAKOTE ) 500 MG DR tablet Take 1 tablet (500 mg total) by mouth every 12 (twelve) hours. 10/18/23 01/07/25  Onita Duos, MD  metFORMIN  (GLUCOPHAGE -XR) 500 MG 24 hr tablet Take 1 tablet (500 mg total) by mouth 2 (two) times daily with a meal. Patient not taking: Reported on 05/08/2024 10/14/23 01/07/25  Nicholaus Brad RAMAN, NP    Allergies: Patient has no known allergies.    Review of Systems  All other systems reviewed and are negative.   Updated Vital Signs BP (!) 156/104 (BP Location: Right Arm)   Pulse (!) 103   Temp 97.8 F (36.6 C) (Oral)   Resp 20   Ht 5' 10 (1.778 m)   Wt 67.1 kg   SpO2 100%   BMI 21.24 kg/m   Physical Exam Vitals and nursing note reviewed.  Constitutional:      Appearance:  Normal appearance.  Pulmonary:     Effort: Pulmonary effort is normal.  Musculoskeletal:     Comments: Bilateral hands are grossly normal in appearance.  He is able to flex and extend his fingers.  Capillary refill is brisk throughout all fingertips.  Skin:    General: Skin is warm and dry.  Neurological:     Mental Status: He is alert and oriented to person, place, and time.     (all labs ordered are listed, but only abnormal results are displayed) Labs Reviewed - No data to display  EKG: None  Radiology: No results found.   Procedures   Medications Ordered in the ED  ketorolac  (TORADOL ) 30 MG/ML injection 60 mg (has no administration in time range)  predniSONE  (DELTASONE ) tablet 40 mg (has no administration in time range)                                    Medical Decision Making Risk Prescription drug management.   Patient presenting with an exacerbation of bilateral hand neuropathy.  He will be treated with prednisone .  IM Toradol  administered here.     Final diagnoses:  None    ED Discharge Orders     None  Geroldine Berg, MD 06/16/24 1328

## 2024-09-19 ENCOUNTER — Other Ambulatory Visit: Payer: Self-pay

## 2024-09-19 ENCOUNTER — Telehealth: Payer: Self-pay

## 2024-09-19 MED ORDER — BICTEGRAVIR-EMTRICITAB-TENOFOV 50-200-25 MG PO TABS: 1.0000 | ORAL_TABLET | Freq: Every day | ORAL | 5 refills | Status: AC

## 2024-09-19 NOTE — Telephone Encounter (Signed)
 Patient needing medication for Biktarvy   to be sent to [facility] on [address], has recently moved to [facility]

## 2024-10-15 ENCOUNTER — Ambulatory Visit: Payer: MEDICAID | Admitting: Internal Medicine

## 2024-10-21 ENCOUNTER — Ambulatory Visit: Payer: MEDICAID | Admitting: Adult Health

## 2024-11-05 ENCOUNTER — Ambulatory Visit: Payer: MEDICAID | Admitting: Internal Medicine
